# Patient Record
Sex: Female | Born: 1935 | Race: White | Hispanic: No | Marital: Married | Smoking: Former smoker
Health system: Southern US, Community
[De-identification: ages and names within clinical notes are randomized; demographics above are authoritative.]

## PROBLEM LIST (undated history)

## (undated) DIAGNOSIS — F0390 Unspecified dementia without behavioral disturbance: Secondary | ICD-10-CM

## (undated) DIAGNOSIS — F419 Anxiety disorder, unspecified: Secondary | ICD-10-CM

## (undated) DIAGNOSIS — K222 Esophageal obstruction: Secondary | ICD-10-CM

## (undated) DIAGNOSIS — J439 Emphysema, unspecified: Secondary | ICD-10-CM

## (undated) DIAGNOSIS — I10 Essential (primary) hypertension: Secondary | ICD-10-CM

## (undated) DIAGNOSIS — K589 Irritable bowel syndrome without diarrhea: Secondary | ICD-10-CM

## (undated) DIAGNOSIS — I739 Peripheral vascular disease, unspecified: Secondary | ICD-10-CM

## (undated) DIAGNOSIS — J189 Pneumonia, unspecified organism: Secondary | ICD-10-CM

## (undated) DIAGNOSIS — I5040 Unspecified combined systolic (congestive) and diastolic (congestive) heart failure: Secondary | ICD-10-CM

## (undated) HISTORY — DX: Unspecified dementia, unspecified severity, without behavioral disturbance, psychotic disturbance, mood disturbance, and anxiety: F03.90

## (undated) HISTORY — DX: Pneumonia, unspecified organism: J18.9

## (undated) HISTORY — DX: Irritable bowel syndrome, unspecified: K58.9

## (undated) HISTORY — PX: FEMORAL BYPASS: SHX50

## (undated) HISTORY — DX: Unspecified combined systolic (congestive) and diastolic (congestive) heart failure: I50.40

---

## 2011-09-18 ENCOUNTER — Emergency Department (HOSPITAL_COMMUNITY)
Admission: EM | Admit: 2011-09-18 | Discharge: 2011-09-18 | Disposition: A | Discharge status: Home / Self Care | Payer: Medicare Other | Attending: Emergency Medicine | Admitting: Emergency Medicine

## 2011-09-18 ENCOUNTER — Emergency Department (HOSPITAL_COMMUNITY): Payer: Medicare Other

## 2011-09-18 DIAGNOSIS — S2239XA Fracture of one rib, unspecified side, initial encounter for closed fracture: Secondary | ICD-10-CM | POA: Insufficient documentation

## 2011-09-18 DIAGNOSIS — S40029A Contusion of unspecified upper arm, initial encounter: Secondary | ICD-10-CM | POA: Insufficient documentation

## 2011-09-18 DIAGNOSIS — Y92009 Unspecified place in unspecified non-institutional (private) residence as the place of occurrence of the external cause: Secondary | ICD-10-CM | POA: Insufficient documentation

## 2011-09-18 DIAGNOSIS — Z794 Long term (current) use of insulin: Secondary | ICD-10-CM | POA: Insufficient documentation

## 2011-09-18 DIAGNOSIS — R079 Chest pain, unspecified: Secondary | ICD-10-CM | POA: Insufficient documentation

## 2011-09-18 DIAGNOSIS — W19XXXA Unspecified fall, initial encounter: Secondary | ICD-10-CM | POA: Insufficient documentation

## 2011-09-18 DIAGNOSIS — R51 Headache: Secondary | ICD-10-CM | POA: Insufficient documentation

## 2012-06-13 ENCOUNTER — Emergency Department (HOSPITAL_COMMUNITY): Payer: Medicare Other

## 2012-06-13 ENCOUNTER — Encounter (HOSPITAL_COMMUNITY): Payer: Self-pay | Admitting: *Deleted

## 2012-06-13 ENCOUNTER — Inpatient Hospital Stay (HOSPITAL_COMMUNITY)
Admission: EM | Admit: 2012-06-13 | Discharge: 2012-06-23 | DRG: 683 | Disposition: A | Discharge status: Home Health | Payer: Medicare Other | Attending: Internal Medicine | Admitting: Internal Medicine

## 2012-06-13 DIAGNOSIS — N390 Urinary tract infection, site not specified: Secondary | ICD-10-CM

## 2012-06-13 DIAGNOSIS — N289 Disorder of kidney and ureter, unspecified: Secondary | ICD-10-CM

## 2012-06-13 DIAGNOSIS — D696 Thrombocytopenia, unspecified: Secondary | ICD-10-CM

## 2012-06-13 DIAGNOSIS — J449 Chronic obstructive pulmonary disease, unspecified: Secondary | ICD-10-CM | POA: Diagnosis present

## 2012-06-13 DIAGNOSIS — E871 Hypo-osmolality and hyponatremia: Secondary | ICD-10-CM

## 2012-06-13 DIAGNOSIS — I10 Essential (primary) hypertension: Secondary | ICD-10-CM

## 2012-06-13 DIAGNOSIS — F411 Generalized anxiety disorder: Secondary | ICD-10-CM | POA: Diagnosis present

## 2012-06-13 DIAGNOSIS — M1990 Unspecified osteoarthritis, unspecified site: Secondary | ICD-10-CM

## 2012-06-13 DIAGNOSIS — Z681 Body mass index (BMI) 19 or less, adult: Secondary | ICD-10-CM

## 2012-06-13 DIAGNOSIS — B370 Candidal stomatitis: Secondary | ICD-10-CM | POA: Diagnosis present

## 2012-06-13 DIAGNOSIS — F419 Anxiety disorder, unspecified: Secondary | ICD-10-CM

## 2012-06-13 DIAGNOSIS — Z66 Do not resuscitate: Secondary | ICD-10-CM | POA: Diagnosis present

## 2012-06-13 DIAGNOSIS — E876 Hypokalemia: Secondary | ICD-10-CM

## 2012-06-13 DIAGNOSIS — E860 Dehydration: Secondary | ICD-10-CM

## 2012-06-13 DIAGNOSIS — D72829 Elevated white blood cell count, unspecified: Secondary | ICD-10-CM

## 2012-06-13 DIAGNOSIS — K222 Esophageal obstruction: Secondary | ICD-10-CM

## 2012-06-13 DIAGNOSIS — N179 Acute kidney failure, unspecified: Principal | ICD-10-CM

## 2012-06-13 DIAGNOSIS — J4489 Other specified chronic obstructive pulmonary disease: Secondary | ICD-10-CM | POA: Diagnosis present

## 2012-06-13 DIAGNOSIS — K449 Diaphragmatic hernia without obstruction or gangrene: Secondary | ICD-10-CM | POA: Diagnosis present

## 2012-06-13 DIAGNOSIS — R404 Transient alteration of awareness: Secondary | ICD-10-CM

## 2012-06-13 DIAGNOSIS — R64 Cachexia: Secondary | ICD-10-CM | POA: Diagnosis present

## 2012-06-13 DIAGNOSIS — I739 Peripheral vascular disease, unspecified: Secondary | ICD-10-CM

## 2012-06-13 DIAGNOSIS — E785 Hyperlipidemia, unspecified: Secondary | ICD-10-CM | POA: Diagnosis present

## 2012-06-13 HISTORY — DX: Esophageal obstruction: K22.2

## 2012-06-13 HISTORY — DX: Peripheral vascular disease, unspecified: I73.9

## 2012-06-13 HISTORY — DX: Essential (primary) hypertension: I10

## 2012-06-13 HISTORY — DX: Anxiety disorder, unspecified: F41.9

## 2012-06-13 LAB — URINE MICROSCOPIC-ADD ON

## 2012-06-13 LAB — CBC
HCT: 35.8 % — ABNORMAL LOW (ref 36.0–46.0)
Platelets: 124 10*3/uL — ABNORMAL LOW (ref 150–400)
RDW: 13.1 % (ref 11.5–15.5)
WBC: 17.6 10*3/uL — ABNORMAL HIGH (ref 4.0–10.5)

## 2012-06-13 LAB — URINALYSIS, ROUTINE W REFLEX MICROSCOPIC
Glucose, UA: NEGATIVE mg/dL
Ketones, ur: NEGATIVE mg/dL
Protein, ur: 100 mg/dL — AB
pH: 5.5 (ref 5.0–8.0)

## 2012-06-13 LAB — COMPREHENSIVE METABOLIC PANEL
ALT: 20 U/L (ref 0–35)
AST: 36 U/L (ref 0–37)
Albumin: 2.9 g/dL — ABNORMAL LOW (ref 3.5–5.2)
Alkaline Phosphatase: 120 U/L — ABNORMAL HIGH (ref 39–117)
Chloride: 89 mEq/L — ABNORMAL LOW (ref 96–112)
Potassium: 2.8 mEq/L — ABNORMAL LOW (ref 3.5–5.1)
Sodium: 128 mEq/L — ABNORMAL LOW (ref 135–145)
Total Bilirubin: 0.3 mg/dL (ref 0.3–1.2)

## 2012-06-13 LAB — DIFFERENTIAL
Basophils Absolute: 0 10*3/uL (ref 0.0–0.1)
Lymphocytes Relative: 4 % — ABNORMAL LOW (ref 12–46)
Monocytes Relative: 9 % (ref 3–12)
Neutro Abs: 15.3 10*3/uL — ABNORMAL HIGH (ref 1.7–7.7)
Neutrophils Relative %: 87 % — ABNORMAL HIGH (ref 43–77)

## 2012-06-13 MED ORDER — HYDROMORPHONE HCL PF 1 MG/ML IJ SOLN: 0.5000 mg | INTRAMUSCULAR | Status: DC | PRN

## 2012-06-13 MED ORDER — CIPROFLOXACIN IN D5W 400 MG/200ML IV SOLN
400.0000 mg | Freq: Once | INTRAVENOUS | Status: AC
  Administered 2012-06-13: 400 mg via INTRAVENOUS
  Filled 2012-06-13: qty 200

## 2012-06-13 MED ORDER — CIPROFLOXACIN IN D5W 200 MG/100ML IV SOLN
200.0000 mg | Freq: Two times a day (BID) | INTRAVENOUS | Status: DC
  Administered 2012-06-14 (×2): 200 mg via INTRAVENOUS
  Filled 2012-06-13 (×3): qty 100

## 2012-06-13 MED ORDER — OXYCODONE HCL 5 MG PO TABS
5.0000 mg | ORAL_TABLET | ORAL | Status: DC | PRN
  Administered 2012-06-14: 5 mg via ORAL
  Filled 2012-06-13: qty 1

## 2012-06-13 MED ORDER — ACETAMINOPHEN 325 MG PO TABS
650.0000 mg | ORAL_TABLET | Freq: Four times a day (QID) | ORAL | Status: DC | PRN
  Administered 2012-06-14 – 2012-06-19 (×2): 650 mg via ORAL
  Filled 2012-06-13 (×2): qty 2

## 2012-06-13 MED ORDER — MORPHINE SULFATE 2 MG/ML IJ SOLN
2.0000 mg | Freq: Once | INTRAMUSCULAR | Status: AC
  Administered 2012-06-13: 2 mg via INTRAVENOUS
  Filled 2012-06-13: qty 1

## 2012-06-13 MED ORDER — ALUM & MAG HYDROXIDE-SIMETH 200-200-20 MG/5ML PO SUSP: 30.0000 mL | Freq: Four times a day (QID) | ORAL | Status: DC | PRN

## 2012-06-13 MED ORDER — ONDANSETRON HCL 4 MG/2ML IJ SOLN
4.0000 mg | Freq: Once | INTRAMUSCULAR | Status: AC
  Administered 2012-06-13: 4 mg via INTRAVENOUS
  Filled 2012-06-13: qty 2

## 2012-06-13 MED ORDER — POTASSIUM CHLORIDE CRYS ER 20 MEQ PO TBCR
40.0000 meq | EXTENDED_RELEASE_TABLET | Freq: Once | ORAL | Status: AC
  Administered 2012-06-13: 40 meq via ORAL
  Filled 2012-06-13: qty 2

## 2012-06-13 MED ORDER — SODIUM CHLORIDE 0.9 % IV SOLN
INTRAVENOUS | Status: DC
  Administered 2012-06-14 – 2012-06-15 (×3): via INTRAVENOUS

## 2012-06-13 MED ORDER — SODIUM CHLORIDE 0.9 % IV BOLUS (SEPSIS)
500.0000 mL | Freq: Once | INTRAVENOUS | Status: AC
  Administered 2012-06-13: 500 mL via INTRAVENOUS

## 2012-06-13 MED ORDER — ZOLPIDEM TARTRATE 5 MG PO TABS: 5.0000 mg | ORAL_TABLET | Freq: Every evening | ORAL | Status: DC | PRN

## 2012-06-13 MED ORDER — ONDANSETRON HCL 4 MG PO TABS: 4.0000 mg | ORAL_TABLET | Freq: Four times a day (QID) | ORAL | Status: DC | PRN

## 2012-06-13 MED ORDER — ONDANSETRON HCL 4 MG/2ML IJ SOLN: 4.0000 mg | Freq: Four times a day (QID) | INTRAMUSCULAR | Status: DC | PRN

## 2012-06-13 MED ORDER — ACETAMINOPHEN 650 MG RE SUPP: 650.0000 mg | Freq: Four times a day (QID) | RECTAL | Status: DC | PRN

## 2012-06-13 NOTE — ED Provider Notes (Addendum)
History     CSN: [id number]  Arrival date & time 06/13/12  1713   First MD Initiated Contact with Patient 06/13/12 1804      Chief Complaint  Patient presents with  . Flank Pain    (Consider location/radiation/quality/duration/timing/severity/associated sxs/prior treatment) HPI Comments: PT from [facility] place, sent over for complaints of left flank pain.  Was initially having some UTI symptoms, urine showed UTI, was started on bactrim last night.  Has had worsening pain to left mid abdomen, radiating to back.  Also has cough productive of yellow sputum.  Has has some nausea, no vomiting.  Some low grade fevers.  Patient is a 78 y.o. female presenting with flank pain. The history is provided by the patient.  Flank Pain This is a new problem. The current episode started more than 2 days ago. The problem occurs constantly. The problem has been gradually worsening. Associated symptoms include abdominal pain. Pertinent negatives include no chest pain, no headaches and no shortness of breath. Nothing aggravates the symptoms. Nothing relieves the symptoms.    Past Medical History  Diagnosis Date  . Hypertension   . Peripheral arterial disease   . Cognitive disorder   . Esophageal stricture   . Osteoarthritis   . Anxiety     Past Surgical History  Procedure Date  . Femoral bypass     History reviewed. No pertinent family history.  History  Substance Use Topics  . Smoking status: Former Games developer  . Smokeless tobacco: Not on file  . Alcohol Use: No    OB History    Grav Para Term Preterm Abortions TAB SAB Ect Mult Living                  Review of Systems  Constitutional: Negative for fever, chills, diaphoresis and fatigue.  HENT: Negative for congestion, rhinorrhea and sneezing.   Eyes: Negative.   Respiratory: Positive for cough. Negative for chest tightness and shortness of breath.   Cardiovascular: Negative for chest pain and leg swelling.  Gastrointestinal:  Positive for abdominal pain. Negative for nausea, vomiting, diarrhea and blood in stool.  Genitourinary: Positive for flank pain. Negative for frequency, hematuria and difficulty urinating.  Musculoskeletal: Negative for back pain and arthralgias.  Skin: Negative for rash.  Neurological: Negative for dizziness, speech difficulty, weakness, numbness and headaches.    Allergies  Penicillins and Sulfa antibiotics  Home Medications   Current Outpatient Rx  Name Route Sig Dispense Refill  . ACETAMINOPHEN 325 MG PO TABS Oral Take 650 mg by mouth every 4 (four) hours as needed. Pain.    . ALBUTEROL SULFATE HFA 108 (90 BASE) MCG/ACT IN AERS Inhalation Inhale 2 puffs into the lungs every 4 (four) hours as needed. Wheezing/shortness of breath.    . ASPIRIN 81 MG PO CHEW Oral Chew 81 mg by mouth daily.    Marland Kitchen VITAMIN D 1000 UNITS PO CAPS Oral Take 1,000 Units by mouth daily.    Marland Kitchen CLOPIDOGREL BISULFATE 75 MG PO TABS Oral Take 75 mg by mouth daily.    Marland Kitchen DIPYRIDAMOLE 50 MG PO TABS Oral Take 100 mg by mouth 2 (two) times daily.    . DONEPEZIL HCL 5 MG PO TABS Oral Take 5 mg by mouth at bedtime.    Marland Kitchen FLUTICASONE-SALMETEROL 100-50 MCG/DOSE IN AEPB Inhalation Inhale 1 puff into the lungs every 12 (twelve) hours.    Marland Kitchen HYDROCHLOROTHIAZIDE 25 MG PO TABS Oral Take 25 mg by mouth daily.    Marland Kitchen MELATONIN  1 MG PO TABS Oral Take 1 mg by mouth at bedtime.    Marland Kitchen METOPROLOL TARTRATE 25 MG PO TABS Oral Take 25 mg by mouth 2 (two) times daily.    Marland Kitchen MIRTAZAPINE 15 MG PO TABS Oral Take 7.5 mg by mouth at bedtime.    Marland Kitchen PRAVASTATIN SODIUM 20 MG PO TABS Oral Take 20 mg by mouth daily.    Marland Kitchen PROMETHAZINE HCL 12.5 MG PO TABS Oral Take 12.5 mg by mouth every 6 (six) hours as needed. Nausea.    Marland Kitchen RANITIDINE HCL 300 MG PO CAPS Oral Take 300 mg by mouth 2 (two) times daily.    Marland Kitchen ROPINIROLE HCL 0.5 MG PO TABS Oral Take 0.5 mg by mouth every evening.    Marland Kitchen SALINE 0.65 % NA SOLN Nasal Place 2 sprays into the nose every morning. May keep  at bedside.    . SULFAMETHOXAZOLE-TMP DS 800-160 MG PO TABS Oral Take 1 tablet by mouth 2 (two) times daily. For 7 days.      BP 141/54  Pulse 89  Temp 98.1 F (36.7 C) (Oral)  Resp 20  Wt 90 lb (40.824 kg)  SpO2 94%  Physical Exam  Constitutional: She is oriented to person, place, and time. She appears well-developed and well-nourished.  HENT:  Head: Normocephalic and atraumatic.  Eyes: Pupils are equal, round, and reactive to light.  Neck: Normal range of motion. Neck supple.  Cardiovascular: Normal rate, regular rhythm and normal heart sounds.   Pulmonary/Chest: Effort normal and breath sounds normal. No respiratory distress. She has no wheezes. She has no rales. She exhibits no tenderness.  Abdominal: Soft. Bowel sounds are normal. There is tenderness (mild diffuse tenderness). There is no rebound and no guarding.  Musculoskeletal: Normal range of motion. She exhibits no edema.  Lymphadenopathy:    She has no cervical adenopathy.  Neurological: She is alert and oriented to person, place, and time.  Skin: Skin is warm and dry. No rash noted.  Psychiatric: She has a normal mood and affect.    ED Course  Procedures (including critical care time)  Results for orders placed during the hospital encounter of 06/13/12  CBC      Component Value Range   WBC 17.6 (*) 4.0 - 10.5 K/uL   RBC 4.17  3.87 - 5.11 MIL/uL   Hemoglobin 12.4  12.0 - 15.0 g/dL   HCT 96.0 (*) 45.4 - 09.8 %   MCV 85.9  78.0 - 100.0 fL   MCH 29.7  26.0 - 34.0 pg   MCHC 34.6  30.0 - 36.0 g/dL   RDW 11.9  14.7 - 82.9 %   Platelets 124 (*) 150 - 400 K/uL  DIFFERENTIAL      Component Value Range   Neutrophils Relative 87 (*) 43 - 77 %   Lymphocytes Relative 4 (*) 12 - 46 %   Monocytes Relative 9  3 - 12 %   Eosinophils Relative 0  0 - 5 %   Basophils Relative 0  0 - 1 %   Neutro Abs 15.3 (*) 1.7 - 7.7 K/uL   Lymphs Abs 0.7  0.7 - 4.0 K/uL   Monocytes Absolute 1.6 (*) 0.1 - 1.0 K/uL   Eosinophils Absolute  0.0  0.0 - 0.7 K/uL   Basophils Absolute 0.0  0.0 - 0.1 K/uL   WBC Morphology TOXIC GRANULATION    COMPREHENSIVE METABOLIC PANEL      Component Value Range   Sodium 128 (*) 135 -  145 mEq/L   Potassium 2.8 (*) 3.5 - 5.1 mEq/L   Chloride 89 (*) 96 - 112 mEq/L   CO2 22  19 - 32 mEq/L   Glucose, Bld 93  70 - 99 mg/dL   BUN 81 (*) 6 - 23 mg/dL   Creatinine, Ser 1.61 (*) 0.50 - 1.10 mg/dL   Calcium 8.8  8.4 - 09.6 mg/dL   Total Protein 6.9  6.0 - 8.3 g/dL   Albumin 2.9 (*) 3.5 - 5.2 g/dL   AST 36  0 - 37 U/L   ALT 20  0 - 35 U/L   Alkaline Phosphatase 120 (*) 39 - 117 U/L   Total Bilirubin 0.3  0.3 - 1.2 mg/dL   GFR calc non Af Amer 9 (*) >90 mL/min   GFR calc Af Amer 11 (*) >90 mL/min  URINALYSIS, ROUTINE W REFLEX MICROSCOPIC      Component Value Range   Color, Urine YELLOW  YELLOW   APPearance CLOUDY (*) CLEAR   Specific Gravity, Urine 1.018  1.005 - 1.030   pH 5.5  5.0 - 8.0   Glucose, UA NEGATIVE  NEGATIVE mg/dL   Hgb urine dipstick MODERATE (*) NEGATIVE   Bilirubin Urine NEGATIVE  NEGATIVE   Ketones, ur NEGATIVE  NEGATIVE mg/dL   Protein, ur 045 (*) NEGATIVE mg/dL   Urobilinogen, UA 0.2  0.0 - 1.0 mg/dL   Nitrite NEGATIVE  NEGATIVE   Leukocytes, UA MODERATE (*) NEGATIVE  LIPASE, BLOOD      Component Value Range   Lipase 52  11 - 59 U/L  URINE MICROSCOPIC-ADD ON      Component Value Range   Squamous Epithelial / LPF RARE  RARE   WBC, UA 21-50  <3 WBC/hpf   RBC / HPF 3-6  <3 RBC/hpf   Bacteria, UA MANY (*) RARE   Urine-Other AMORPHOUS URATES/PHOSPHATES     Ct Abdomen Pelvis Wo Contrast  06/13/2012  *RADIOLOGY REPORT*  Clinical Data: 2-day history of left flank pain.  Hematuria. Recent diagnosis of urinary tract infection.  CT ABDOMEN AND PELVIS WITHOUT CONTRAST  Technique:  Multidetector CT imaging of the abdomen and pelvis was performed following the standard protocol without intravenous contrast.  Comparison: None.  Findings: No evidence of urinary tract calculi or  obstruction on either side.  Cortical calcification cortical calcification involving the anterior aspect of the lower pole left kidney. Approximate 3.3 x 3.0 cm simple cyst arising from the upper pole of the left kidney.  Approximate 4.2 x 2.6 cm cyst arising from the lateral aspect of the lower pole and approximate 1.3 cm diameter cyst arising from the medial aspect of the lower pole left kidney. Approximate 1.6 cm diameter simple cyst arising from the lower pole right kidney.  Within the limits of the unenhanced technique, no solid masses involving either kidney.  Right kidney mildly atrophic, with compensatory enlargement of the left kidney.  Normal unenhanced appearance of the liver, spleen, pancreas, and right adrenal gland.  Approximate 1.6 x 1.2 cm low attenuation nodule involving the left adrenal gland.  Gallbladder unremarkable by CT.  No biliary ductal dilation.  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  No significant lymphadenopathy.  Small hiatal hernia.  Stomach decompressed otherwise unremarkable. Normal-appearing small bowel.  Extensive sigmoid colon diverticulosis without evidence of acute diverticulitis.  Remainder the colon unremarkable.  No ascites.  Urinary bladder decompressed and unremarkable.  Uterus atrophic consistent with age.  No adnexal masses or free pelvic fluid.  Bone window  images demonstrate degenerative changes involving the lower thoracic and lumbar spine and both hips.  Visualized lung bases emphysematous but clear.  Heart size upper normal.  IMPRESSION:  1.  No evidence of urinary tract calculi or obstruction on either side. 2.  Multiple bilateral renal cysts.  Mildly atrophic right kidney. 3.  Small left adrenal nodule, statistically consistent with an adenoma. 4.  Sigmoid colon diverticulosis without evidence of acute diverticulitis. 5.  Small hiatal hernia.  Original Report Authenticated By: Arnell Sieving, M.D.   Dg Chest 2 View  06/13/2012   *RADIOLOGY REPORT*  Clinical Data: Cough and fever.  CHEST - 2 VIEW  Comparison: 09/18/2011  Findings: Cardiomegaly and hyperinflation again noted. Mild peribronchial thickening is unchanged. There is no evidence of focal airspace disease, pulmonary edema, suspicious pulmonary nodule/mass, pleural effusion, or pneumothorax. No acute bony abnormalities are identified.  IMPRESSION: No evidence of acute cardiopulmonary disease.  Cardiomegaly with mild chronic peribronchial thickening and hyperinflation.  Original Report Authenticated By: Rosendo Gros, M.D.      1. UTI (lower urinary tract infection)   2. Renal insufficiency   3. Hypokalemia   4. Hyponatremia       MDM  Pt with elevated WBC, renal insufficiency.  Discussed with nurse at [facility] and last labs were 01/10/12, showing normal sodium and potassium and creatinine of 1.12.  Will plan on admission for pt for further evaluation and treatment.        Rolan Bucco, MD 06/13/12 [id number]  Rolan Bucco, MD 07/12/12 0030

## 2012-06-13 NOTE — ED Notes (Signed)
Pt c/o left flank pain since Wednesday, pt recently diagnosed with UTI and started on antibiotics yesterday. States pain "is worse than having a baby." Family also reports pt has been "Coughing up yellow stuff." since Sunday. Pt is resident at [facility].

## 2012-06-14 ENCOUNTER — Encounter (HOSPITAL_COMMUNITY): Payer: Self-pay | Admitting: *Deleted

## 2012-06-14 DIAGNOSIS — E860 Dehydration: Secondary | ICD-10-CM

## 2012-06-14 DIAGNOSIS — E782 Mixed hyperlipidemia: Secondary | ICD-10-CM

## 2012-06-14 DIAGNOSIS — R404 Transient alteration of awareness: Secondary | ICD-10-CM

## 2012-06-14 DIAGNOSIS — D72829 Elevated white blood cell count, unspecified: Secondary | ICD-10-CM | POA: Diagnosis present

## 2012-06-14 DIAGNOSIS — N179 Acute kidney failure, unspecified: Secondary | ICD-10-CM

## 2012-06-14 LAB — BASIC METABOLIC PANEL
BUN: 77 mg/dL — ABNORMAL HIGH (ref 6–23)
Creatinine, Ser: 3.68 mg/dL — ABNORMAL HIGH (ref 0.50–1.10)
GFR calc Af Amer: 12 mL/min — ABNORMAL LOW (ref >90)
GFR calc non Af Amer: 10 mL/min — ABNORMAL LOW (ref >90)
Potassium: 3.5 mEq/L (ref 3.5–5.1)

## 2012-06-14 LAB — CBC
Hemoglobin: 11.6 g/dL — ABNORMAL LOW (ref 12.0–15.0)
MCHC: 33.6 g/dL (ref 30.0–36.0)
RDW: 13.2 % (ref 11.5–15.5)

## 2012-06-14 LAB — NA AND K (SODIUM & POTASSIUM), RAND UR
Potassium Urine: 14 mEq/L
Sodium, Ur: 39 mEq/L

## 2012-06-14 MED ORDER — CLOPIDOGREL BISULFATE 75 MG PO TABS
75.0000 mg | ORAL_TABLET | Freq: Every day | ORAL | Status: DC
  Administered 2012-06-14 – 2012-06-17 (×4): 75 mg via ORAL
  Filled 2012-06-14 (×4): qty 1

## 2012-06-14 MED ORDER — ROPINIROLE HCL 0.5 MG PO TABS
0.5000 mg | ORAL_TABLET | Freq: Every day | ORAL | Status: DC
  Administered 2012-06-14 – 2012-06-22 (×9): 0.5 mg via ORAL
  Filled 2012-06-14 (×10): qty 1

## 2012-06-14 MED ORDER — ASPIRIN 81 MG PO CHEW
81.0000 mg | CHEWABLE_TABLET | Freq: Every day | ORAL | Status: DC
  Administered 2012-06-14 – 2012-06-23 (×10): 81 mg via ORAL
  Filled 2012-06-14 (×11): qty 1

## 2012-06-14 MED ORDER — SODIUM CHLORIDE 0.9 % IV SOLN
INTRAVENOUS | Status: AC
  Administered 2012-06-14: 02:00:00 via INTRAVENOUS

## 2012-06-14 MED ORDER — SALINE SPRAY 0.65 % NA SOLN
2.0000 | Freq: Every morning | NASAL | Status: DC
  Administered 2012-06-14 – 2012-06-23 (×10): 2 via NASAL
  Filled 2012-06-14: qty 44

## 2012-06-14 MED ORDER — DONEPEZIL HCL 5 MG PO TABS
5.0000 mg | ORAL_TABLET | Freq: Every day | ORAL | Status: DC
  Administered 2012-06-14 – 2012-06-22 (×9): 5 mg via ORAL
  Filled 2012-06-14 (×10): qty 1

## 2012-06-14 MED ORDER — NYSTATIN 100000 UNIT/ML MT SUSP
5.0000 mL | Freq: Four times a day (QID) | OROMUCOSAL | Status: DC
  Administered 2012-06-14 – 2012-06-20 (×25): 500000 [IU] via ORAL
  Filled 2012-06-14 (×29): qty 5

## 2012-06-14 MED ORDER — DIPYRIDAMOLE 50 MG PO TABS
100.0000 mg | ORAL_TABLET | Freq: Two times a day (BID) | ORAL | Status: DC
  Administered 2012-06-14 – 2012-06-19 (×12): 100 mg via ORAL
  Filled 2012-06-14 (×14): qty 2

## 2012-06-14 MED ORDER — MIRTAZAPINE 7.5 MG PO TABS
7.5000 mg | ORAL_TABLET | Freq: Every day | ORAL | Status: DC
  Administered 2012-06-14 – 2012-06-22 (×9): 7.5 mg via ORAL
  Filled 2012-06-14 (×10): qty 1

## 2012-06-14 MED ORDER — BIOTENE DRY MOUTH MT LIQD
15.0000 mL | Freq: Two times a day (BID) | OROMUCOSAL | Status: DC
  Administered 2012-06-14 – 2012-06-23 (×18): 15 mL via OROMUCOSAL

## 2012-06-14 MED ORDER — METOPROLOL TARTRATE 25 MG PO TABS
25.0000 mg | ORAL_TABLET | Freq: Two times a day (BID) | ORAL | Status: DC
  Administered 2012-06-14 – 2012-06-17 (×7): 25 mg via ORAL
  Filled 2012-06-14 (×8): qty 1

## 2012-06-14 MED ORDER — SIMVASTATIN 10 MG PO TABS
10.0000 mg | ORAL_TABLET | Freq: Every day | ORAL | Status: DC
  Administered 2012-06-14 – 2012-06-22 (×9): 10 mg via ORAL
  Filled 2012-06-14 (×10): qty 1

## 2012-06-14 MED ORDER — FLUTICASONE-SALMETEROL 100-50 MCG/DOSE IN AEPB
1.0000 | INHALATION_SPRAY | Freq: Two times a day (BID) | RESPIRATORY_TRACT | Status: DC
  Administered 2012-06-14 – 2012-06-23 (×18): 1 via RESPIRATORY_TRACT
  Filled 2012-06-14 (×2): qty 14

## 2012-06-14 MED ORDER — ALBUTEROL SULFATE HFA 108 (90 BASE) MCG/ACT IN AERS
2.0000 | INHALATION_SPRAY | RESPIRATORY_TRACT | Status: DC | PRN
  Administered 2012-06-14 – 2012-06-19 (×4): 2 via RESPIRATORY_TRACT
  Filled 2012-06-14: qty 6.7

## 2012-06-14 MED ORDER — PANTOPRAZOLE SODIUM 40 MG PO TBEC
40.0000 mg | DELAYED_RELEASE_TABLET | Freq: Every day | ORAL | Status: DC
  Administered 2012-06-14 – 2012-06-23 (×10): 40 mg via ORAL
  Filled 2012-06-14 (×11): qty 1

## 2012-06-14 NOTE — Progress Notes (Signed)
Subjective: No new issues overnight.  Objective: Vital signs in last 24 hours: Temp:  [97.6 F (36.4 C)-98.7 F (37.1 C)] 98.4 F (36.9 C) (06/22 0628) Pulse Rate:  [83-90] 90  (06/22 0628) Resp:  [16-22] 20  (06/22 0628) BP: (103-145)/(42-73) 145/73 mmHg (06/22 0628) SpO2:  [94 %-97 %] 97 % (06/22 0628) Weight:  [40.824 kg (90 lb)-41.3 kg (91 lb 0.8 oz)] 41.3 kg (91 lb 0.8 oz) (06/22 0100) Weight change:  Last BM Date: 06/13/12  Intake/Output from previous day: 06/21 0701 - 06/22 0700 In: 337.5 [I.V.:337.5] Out: 400 [Urine:400] Total I/O In: -  Out: 150 [Urine:150]   Physical Exam: General: Comfortable, alert, very garrulous and communicative, not short of breath at rest, has occasional cough. HEENT:  Mild clinical pallor, no jaundice, no conjunctival injection or discharge. Hydration appears to have improved.  NECK:  Supple, JVP not seen, no carotid bruits, no palpable lymphadenopathy, no palpable goiter. CHEST:  Clinically clear to auscultation, no wheezes, no crackles. HEART:  Sounds 1 and 2 heard, normal, regular, no murmurs. ABDOMEN:  Flat, soft, non-tender, no palpable organomegaly, no palpable masses, normal bowel sounds. GENITALIA:  Not examined. LOWER EXTREMITIES:  No pitting edema, palpable peripheral pulses. MUSCULOSKELETAL SYSTEM:  Generalized osteoarthritic changes, otherwise, normal. CENTRAL NERVOUS SYSTEM:  No focal neurologic deficit on gross examination.  Lab Results:  [facility] 06/14/12 0537 06/13/12 1837  WBC 12.6* 17.6*  HGB 11.6* 12.4  HCT 34.5* 35.8*  PLT 107* 124*    Basename 06/14/12 0537 06/13/12 1837  NA 130* 128*  K 3.5 2.8*  CL 96 89*  CO2 19 22  GLUCOSE 76 93  BUN 77* 81*  CREATININE 3.68* 3.93*  CALCIUM 8.0* 8.8   Recent Results (from the past 240 hour(s))  MRSA PCR SCREENING     Status: Normal   Collection Time   06/14/12  2:14 AM      Component Value Range Status Comment   MRSA by PCR NEGATIVE  NEGATIVE Final       Studies/Results: Ct Abdomen Pelvis Wo Contrast  06/13/2012  *RADIOLOGY REPORT*  Clinical Data: 2-day history of left flank pain.  Hematuria. Recent diagnosis of urinary tract infection.  CT ABDOMEN AND PELVIS WITHOUT CONTRAST  Technique:  Multidetector CT imaging of the abdomen and pelvis was performed following the standard protocol without intravenous contrast.  Comparison: None.  Findings: No evidence of urinary tract calculi or obstruction on either side.  Cortical calcification cortical calcification involving the anterior aspect of the lower pole left kidney. Approximate 3.3 x 3.0 cm simple cyst arising from the upper pole of the left kidney.  Approximate 4.2 x 2.6 cm cyst arising from the lateral aspect of the lower pole and approximate 1.3 cm diameter cyst arising from the medial aspect of the lower pole left kidney. Approximate 1.6 cm diameter simple cyst arising from the lower pole right kidney.  Within the limits of the unenhanced technique, no solid masses involving either kidney.  Right kidney mildly atrophic, with compensatory enlargement of the left kidney.  Normal unenhanced appearance of the liver, spleen, pancreas, and right adrenal gland.  Approximate 1.6 x 1.2 cm low attenuation nodule involving the left adrenal gland.  Gallbladder unremarkable by CT.  No biliary ductal dilation.  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  No significant lymphadenopathy.  Small hiatal hernia.  Stomach decompressed otherwise unremarkable. Normal-appearing small bowel.  Extensive sigmoid colon diverticulosis without evidence of acute diverticulitis.  Remainder the colon unremarkable.  No ascites.  Urinary  bladder decompressed and unremarkable.  Uterus atrophic consistent with age.  No adnexal masses or free pelvic fluid.  Bone window images demonstrate degenerative changes involving the lower thoracic and lumbar spine and both hips.  Visualized lung bases emphysematous but clear.  Heart  size upper normal.  IMPRESSION:  1.  No evidence of urinary tract calculi or obstruction on either side. 2.  Multiple bilateral renal cysts.  Mildly atrophic right kidney. 3.  Small left adrenal nodule, statistically consistent with an adenoma. 4.  Sigmoid colon diverticulosis without evidence of acute diverticulitis. 5.  Small hiatal hernia.  Original Report Authenticated By: Arnell Sieving, M.D.   Dg Chest 2 View  06/13/2012  *RADIOLOGY REPORT*  Clinical Data: Cough and fever.  CHEST - 2 VIEW  Comparison: 09/18/2011  Findings: Cardiomegaly and hyperinflation again noted. Mild peribronchial thickening is unchanged. There is no evidence of focal airspace disease, pulmonary edema, suspicious pulmonary nodule/mass, pleural effusion, or pneumothorax. No acute bony abnormalities are identified.  IMPRESSION: No evidence of acute cardiopulmonary disease.  Cardiomegaly with mild chronic peribronchial thickening and hyperinflation.  Original Report Authenticated By: Rosendo Gros, M.D.    Medications: Scheduled Meds:   . sodium chloride   Intravenous STAT  . antiseptic oral rinse  15 mL Mouth Rinse BID  . ciprofloxacin  200 mg Intravenous Q12H  . ciprofloxacin  400 mg Intravenous Once  .  morphine injection  2 mg Intravenous Once  .  morphine injection  2 mg Intravenous Once  . ondansetron  4 mg Intravenous Once  . potassium chloride  40 mEq Oral Once  . sodium chloride  500 mL Intravenous Once   Continuous Infusions:   . sodium chloride 75 mL/hr at 06/13/12 2347   PRN Meds:.acetaminophen, acetaminophen, alum & mag hydroxide-simeth, HYDROmorphone (DILAUDID) injection, ondansetron (ZOFRAN) IV, ondansetron, oxyCODONE, zolpidem  Assessment/Plan:  Active Problems: 1. UTI (urinary tract infection): Patient was found to have a UTI in the facility and was placed on Bactrim 2 days ago, and now presents with fever and chills. Urinalysis showed positive sediment, with pyuria and significant  bacteriuria. She has been commenced on iv Ciprofloxacin, now day#1, and cultures are pending. This AM , she is apyrexial, and wcc is already trending down.  2. Dehydration/ARF (acute renal failure): Patient presented with BUN 81 and creatinine 3.93, consistent with dehydration and ARF, against a background of UTI, poor oral intake and diuretics. Unfortunately, baseline creatinine is unknown at this time. We are managing with iv fluids, diuretics have been discontinued, and overnight, creatinine has already shown some improvement. Following renal indices.  3. Hyponatremia: Patient had a hyponatremia of 128 at presentation, secondary to diuretics and volume depletion. Managing as described above. 4. Hypokalemia: Repleting as indicated. 5. Hypertension: BP is reasonably controlled, at this time.  6. Peripheral arterial disease: Not problematic.  7. Anxiety/Dementia: Stable on pre-admission psychotropics.  8. Dyslipidemia: Continue Statin. 9. Esophageal stricture: Patient gets periodic esophageal dilatations, by primary GI in [place]. According to her, this is due soon.        LOS: 1 day   Honestee Revard,CHRISTOPHER 06/14/2012, 11:24 AM

## 2012-06-14 NOTE — Progress Notes (Signed)
Patient states she hasn't slept in two days and requesting a nerve pill.

## 2012-06-14 NOTE — H&P (Signed)
DATE OF ADMISSION:  06/14/2012  PCP:    No primary provider on file.   Chief Complaint: Fever and Lower ABD Pain   HPI: [name] is an 78 y.o. female resident of [facility] who was sent to the ED due to complaints of fevers and chills today,  and lower ABD Pain radiating into her back worsening over the past 5 days.  She was found to have a UTI in the facility and was placed on Bactrim 2 days ago but her symptoms worsened.  She also has had a cough but her daughter reports that this usually happens when she need her esophagus dilated due to a stricture that she has.  In the ED she was found to have an elevated BUN/Cr, and a +UA, and a CT scan without contrast was performed which did not reveal pyelonephritis nor a kidney stone.  She was placed on IV Cipro due to her allergies and recent Bactrim therapy.    Past Medical History  Diagnosis Date  . Hypertension   . Peripheral arterial disease   . Cognitive disorder   . Esophageal stricture   . Osteoarthritis   . Anxiety        Diverticulosis  Past Surgical History  Procedure Date  . Femoral bypass     Medications:  HOME MEDS: Prior to Admission medications   Medication Sig Start Date End Date Taking? Authorizing Provider  acetaminophen (MAPAP) 325 MG tablet Take 650 mg by mouth every 4 (four) hours as needed. Pain.   Yes Historical Provider, MD  albuterol (PROVENTIL HFA;VENTOLIN HFA) 108 (90 BASE) MCG/ACT inhaler Inhale 2 puffs into the lungs every 4 (four) hours as needed. Wheezing/shortness of breath.   Yes Historical Provider, MD  aspirin 81 MG chewable tablet Chew 81 mg by mouth daily.   Yes Historical Provider, MD  Cholecalciferol (VITAMIN D) 1000 UNITS capsule Take 1,000 Units by mouth daily.   Yes Historical Provider, MD  clopidogrel (PLAVIX) 75 MG tablet Take 75 mg by mouth daily.   Yes Historical Provider, MD  dipyridamole (PERSANTINE) 50 MG tablet Take 100 mg by mouth 2 (two) times daily.   Yes Historical  Provider, MD  donepezil (ARICEPT) 5 MG tablet Take 5 mg by mouth at bedtime.   Yes Historical Provider, MD  Fluticasone-Salmeterol (ADVAIR) 100-50 MCG/DOSE AEPB Inhale 1 puff into the lungs every 12 (twelve) hours.   Yes Historical Provider, MD  hydrochlorothiazide (HYDRODIURIL) 25 MG tablet Take 25 mg by mouth daily.   Yes Historical Provider, MD  Melatonin 1 MG TABS Take 1 mg by mouth at bedtime.   Yes Historical Provider, MD  metoprolol tartrate (LOPRESSOR) 25 MG tablet Take 25 mg by mouth 2 (two) times daily.   Yes Historical Provider, MD  mirtazapine (REMERON) 15 MG tablet Take 7.5 mg by mouth at bedtime.   Yes Historical Provider, MD  pravastatin (PRAVACHOL) 20 MG tablet Take 20 mg by mouth daily.   Yes Historical Provider, MD  promethazine (PHENERGAN) 12.5 MG tablet Take 12.5 mg by mouth every 6 (six) hours as needed. Nausea.   Yes Historical Provider, MD  ranitidine (ZANTAC) 300 MG capsule Take 300 mg by mouth 2 (two) times daily.   Yes Historical Provider, MD  rOPINIRole (REQUIP) 0.5 MG tablet Take 0.5 mg by mouth every evening.   Yes Historical Provider, MD  sodium chloride (OCEAN) 0.65 % SOLN nasal spray Place 2 sprays into the nose every morning. May keep at bedside.   Yes Historical  Provider, MD  sulfamethoxazole-trimethoprim (BACTRIM DS) 800-160 MG per tablet Take 1 tablet by mouth 2 (two) times daily. For 7 days. 06/12/12  Yes Historical Provider, MD    Allergies:  Allergies  Allergen Reactions  . Penicillins   . Sulfa Antibiotics     Social History:   reports that she has quit smoking. She does not have any smokeless tobacco history on file. She reports that she does not drink alcohol or use illicit drugs.  Family History: Family History  Problem Relation Age of Onset  . Coronary artery disease Father   . Coronary artery disease Mother   . Hypertension Mother   . Breast cancer Maternal Grandmother   . Breast cancer Maternal Aunt     Review of Systems: abdominal  pain The patient denies anorexia, fever, weight loss, vision loss, decreased hearing, hoarseness, chest pain, syncope, dyspnea on exertion, peripheral edema, balance deficits, hemoptysis, melena, hematochezia, severe indigestion/heartburn, hematuria, incontinence, genital sores, muscle weakness, suspicious skin lesions, transient blindness, difficulty walking, depression, unusual weight change, abnormal bleeding, enlarged lymph nodes, angioedema, and breast masses.   Physical Exam:  GEN:  Pleasant Elderly Thin 78 year old Caucasian female examined  and in no acute distress; cooperative with exam Filed Vitals:   06/13/12 1725 06/13/12 1739 06/13/12 2131  BP: 103/42  141/54  Pulse: 83  89  Temp: 97.6 F (36.4 C)  98.1 F (36.7 C)  TempSrc: Oral  Oral  Resp: 16  20  Weight:  40.824 kg (90 lb)   SpO2: 94%  94%   Blood pressure 141/54, pulse 89, temperature 98.1 F (36.7 C), temperature source Oral, resp. rate 20, weight 40.824 kg (90 lb), SpO2 94.00%. PSYCH: She is alert and oriented x4; does not appear anxious does not appear depressed; affect is normal HEENT: Normocephalic and Atraumatic, Mucous membranes pink; PERRLA; EOM intact; Fundi:  Benign;  No scleral icterus, Nares: Patent, Oropharynx: Clear, Poor Dentition, Neck:  FROM, no cervical lymphadenopathy nor thyromegaly or carotid bruit; no JVD; Breasts:: Not examined CHEST WALL: No tenderness CHEST: Normal respiration, clear to auscultation bilaterally HEART: Regular rate and rhythm; no murmurs rubs or gallops BACK: No kyphosis or scoliosis; no CVA tenderness ABDOMEN: Positive Bowel Sounds, soft non-tender; no masses, no organomegaly.   Rectal Exam: Not done EXTREMITIES: No bone or joint deformity; age-appropriate arthropathy of the hands and knees; no cyanosis, clubbing or edema; no ulcerations. Genitalia: not examined PULSES: 2+ and symmetric SKIN: Normal hydration no rash or ulceration CNS: Cranial nerves 2-12 grossly intact no  focal neurologic deficit   Labs & Imaging Results for orders placed during the hospital encounter of 06/13/12 (from the past 48 hour(s))  CBC     Status: Abnormal   Collection Time   06/13/12  6:37 PM      Component Value Range Comment   WBC 17.6 (*) 4.0 - 10.5 K/uL    RBC 4.17  3.87 - 5.11 MIL/uL    Hemoglobin 12.4  12.0 - 15.0 g/dL    HCT 16.1 (*) 09.6 - 46.0 %    MCV 85.9  78.0 - 100.0 fL    MCH 29.7  26.0 - 34.0 pg    MCHC 34.6  30.0 - 36.0 g/dL    RDW 04.5  40.9 - 81.1 %    Platelets 124 (*) 150 - 400 K/uL   DIFFERENTIAL     Status: Abnormal   Collection Time   06/13/12  6:37 PM      Component Value  Range Comment   Neutrophils Relative 87 (*) 43 - 77 %    Lymphocytes Relative 4 (*) 12 - 46 %    Monocytes Relative 9  3 - 12 %    Eosinophils Relative 0  0 - 5 %    Basophils Relative 0  0 - 1 %    Neutro Abs 15.3 (*) 1.7 - 7.7 K/uL    Lymphs Abs 0.7  0.7 - 4.0 K/uL    Monocytes Absolute 1.6 (*) 0.1 - 1.0 K/uL    Eosinophils Absolute 0.0  0.0 - 0.7 K/uL    Basophils Absolute 0.0  0.0 - 0.1 K/uL    WBC Morphology TOXIC GRANULATION     COMPREHENSIVE METABOLIC PANEL     Status: Abnormal   Collection Time   06/13/12  6:37 PM      Component Value Range Comment   Sodium 128 (*) 135 - 145 mEq/L    Potassium 2.8 (*) 3.5 - 5.1 mEq/L    Chloride 89 (*) 96 - 112 mEq/L    CO2 22  19 - 32 mEq/L    Glucose, Bld 93  70 - 99 mg/dL    BUN 81 (*) 6 - 23 mg/dL    Creatinine, Ser 1.61 (*) 0.50 - 1.10 mg/dL    Calcium 8.8  8.4 - 09.6 mg/dL    Total Protein 6.9  6.0 - 8.3 g/dL    Albumin 2.9 (*) 3.5 - 5.2 g/dL    AST 36  0 - 37 U/L    ALT 20  0 - 35 U/L    Alkaline Phosphatase 120 (*) 39 - 117 U/L    Total Bilirubin 0.3  0.3 - 1.2 mg/dL    GFR calc non Af Amer 9 (*) >90 mL/min    GFR calc Af Amer 11 (*) >90 mL/min   LIPASE, BLOOD     Status: Normal   Collection Time   06/13/12  6:37 PM      Component Value Range Comment   Lipase 52  11 - 59 U/L   URINALYSIS, ROUTINE W REFLEX  MICROSCOPIC     Status: Abnormal   Collection Time   06/13/12  8:36 PM      Component Value Range Comment   Color, Urine YELLOW  YELLOW    APPearance CLOUDY (*) CLEAR    Specific Gravity, Urine 1.018  1.005 - 1.030    pH 5.5  5.0 - 8.0    Glucose, UA NEGATIVE  NEGATIVE mg/dL    Hgb urine dipstick MODERATE (*) NEGATIVE    Bilirubin Urine NEGATIVE  NEGATIVE    Ketones, ur NEGATIVE  NEGATIVE mg/dL    Protein, ur 045 (*) NEGATIVE mg/dL    Urobilinogen, UA 0.2  0.0 - 1.0 mg/dL    Nitrite NEGATIVE  NEGATIVE    Leukocytes, UA MODERATE (*) NEGATIVE   URINE MICROSCOPIC-ADD ON     Status: Abnormal   Collection Time   06/13/12  8:36 PM      Component Value Range Comment   Squamous Epithelial / LPF RARE  RARE    WBC, UA 21-50  <3 WBC/hpf    RBC / HPF 3-6  <3 RBC/hpf    Bacteria, UA MANY (*) RARE    Urine-Other AMORPHOUS URATES/PHOSPHATES      Ct Abdomen Pelvis Wo Contrast  06/13/2012  *RADIOLOGY REPORT*  Clinical Data: 2-day history of left flank pain.  Hematuria. Recent diagnosis of urinary tract infection.  CT ABDOMEN AND  PELVIS WITHOUT CONTRAST  Technique:  Multidetector CT imaging of the abdomen and pelvis was performed following the standard protocol without intravenous contrast.  Comparison: None.  Findings: No evidence of urinary tract calculi or obstruction on either side.  Cortical calcification cortical calcification involving the anterior aspect of the lower pole left kidney. Approximate 3.3 x 3.0 cm simple cyst arising from the upper pole of the left kidney.  Approximate 4.2 x 2.6 cm cyst arising from the lateral aspect of the lower pole and approximate 1.3 cm diameter cyst arising from the medial aspect of the lower pole left kidney. Approximate 1.6 cm diameter simple cyst arising from the lower pole right kidney.  Within the limits of the unenhanced technique, no solid masses involving either kidney.  Right kidney mildly atrophic, with compensatory enlargement of the left kidney.  Normal  unenhanced appearance of the liver, spleen, pancreas, and right adrenal gland.  Approximate 1.6 x 1.2 cm low attenuation nodule involving the left adrenal gland.  Gallbladder unremarkable by CT.  No biliary ductal dilation.  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  No significant lymphadenopathy.  Small hiatal hernia.  Stomach decompressed otherwise unremarkable. Normal-appearing small bowel.  Extensive sigmoid colon diverticulosis without evidence of acute diverticulitis.  Remainder the colon unremarkable.  No ascites.  Urinary bladder decompressed and unremarkable.  Uterus atrophic consistent with age.  No adnexal masses or free pelvic fluid.  Bone window images demonstrate degenerative changes involving the lower thoracic and lumbar spine and both hips.  Visualized lung bases emphysematous but clear.  Heart size upper normal.  IMPRESSION:  1.  No evidence of urinary tract calculi or obstruction on either side. 2.  Multiple bilateral renal cysts.  Mildly atrophic right kidney. 3.  Small left adrenal nodule, statistically consistent with an adenoma. 4.  Sigmoid colon diverticulosis without evidence of acute diverticulitis. 5.  Small hiatal hernia.  Original Report Authenticated By: Arnell Sieving, M.D.   Dg Chest 2 View  06/13/2012  *RADIOLOGY REPORT*  Clinical Data: Cough and fever.  CHEST - 2 VIEW  Comparison: 09/18/2011  Findings: Cardiomegaly and hyperinflation again noted. Mild peribronchial thickening is unchanged. There is no evidence of focal airspace disease, pulmonary edema, suspicious pulmonary nodule/mass, pleural effusion, or pneumothorax. No acute bony abnormalities are identified.  IMPRESSION: No evidence of acute cardiopulmonary disease.  Cardiomegaly with mild chronic peribronchial thickening and hyperinflation.  Original Report Authenticated By: Rosendo Gros, M.D.      Assessment:  Present on Admission:  .ARF (acute renal failure) .UTI (urinary tract  infection) .Hypertension .Peripheral arterial disease .Osteoarthritis .Anxiety .Hyponatremia .Hypokalemia .Thrombocytopenia .Leukocytosis   Plan:    Admit to Med/Surg Bed Urine C+S sent, IV Cipro started, adjust PRN Cx result Send Urine Electrolytes IVFs Reconcile Home Medications Monitor Electrolytes and replete PRN DVT Prophylaxis Other plans as per orders.    CODE STATUS:      DO NOT RESUSCITATE (DNR)     DO NOT INTUBATE (DNI)    Jojo Pehl C 06/14/2012, 12:02 AM

## 2012-06-15 DIAGNOSIS — N179 Acute kidney failure, unspecified: Secondary | ICD-10-CM

## 2012-06-15 DIAGNOSIS — R404 Transient alteration of awareness: Secondary | ICD-10-CM

## 2012-06-15 DIAGNOSIS — E860 Dehydration: Secondary | ICD-10-CM

## 2012-06-15 DIAGNOSIS — E782 Mixed hyperlipidemia: Secondary | ICD-10-CM

## 2012-06-15 LAB — BASIC METABOLIC PANEL
Calcium: 8.4 mg/dL (ref 8.4–10.5)
Chloride: 100 mEq/L (ref 96–112)
Creatinine, Ser: 3.25 mg/dL — ABNORMAL HIGH (ref 0.50–1.10)
GFR calc Af Amer: 14 mL/min — ABNORMAL LOW (ref >90)
Sodium: 132 mEq/L — ABNORMAL LOW (ref 135–145)

## 2012-06-15 LAB — CBC
MCH: 29.6 pg (ref 26.0–34.0)
MCV: 87.1 fL (ref 78.0–100.0)
Platelets: 124 10*3/uL — ABNORMAL LOW (ref 150–400)
RDW: 13.6 % (ref 11.5–15.5)
WBC: 16.7 10*3/uL — ABNORMAL HIGH (ref 4.0–10.5)

## 2012-06-15 MED ORDER — LEVOFLOXACIN IN D5W 250 MG/50ML IV SOLN
250.0000 mg | INTRAVENOUS | Status: DC
  Administered 2012-06-15 – 2012-06-17 (×3): 250 mg via INTRAVENOUS
  Filled 2012-06-15 (×4): qty 50

## 2012-06-15 MED ORDER — GUAIFENESIN 100 MG/5ML PO SOLN
10.0000 mL | ORAL | Status: DC | PRN
  Administered 2012-06-15: 200 mg via ORAL
  Filled 2012-06-15: qty 10

## 2012-06-15 MED ORDER — GUAIFENESIN 100 MG/5ML PO SYRP: 200.0000 mg | ORAL_SOLUTION | ORAL | Status: DC | PRN

## 2012-06-15 MED ORDER — LORAZEPAM 0.5 MG PO TABS
0.5000 mg | ORAL_TABLET | Freq: Once | ORAL | Status: AC
  Administered 2012-06-15: 0.5 mg via ORAL
  Filled 2012-06-15: qty 1

## 2012-06-15 NOTE — Consult Note (Signed)
Reason for Consult: CROSS COVER [facility] Referring Physician: Dr. Ricke Hey  [name] is an 78 y.o. female.  HPI: Patient has a history of esophageal strictures and has had multiple dilatations in the past. She has been on PPI's for reflux. She claims she has to cough every time she tries to eat. She has also had diffuse abdominal cramping since admission and feels she cannot get comfortable. She denies a history of melena or hematochezia in the recent past. She tends to have diarrhea alternating with constipation. She can go 2-3 days without a BM when she is not at home. She has a history of diverticulosis, acid reflux and a hiatal hernia. She has been admitted for a UTI with dehydration.  Past Medical History  Diagnosis Date  . Hypertension   . Peripheral arterial disease   . Cognitive disorder   . Esophageal stricture   . Osteoarthritis   . Anxiety   .    Acid reflux/hiatal hernia  Past Surgical History  Procedure Date  . Femoral bypass     Family History  Problem Relation Age of Onset  . Coronary artery disease Father   . Coronary artery disease Mother   . Hypertension Mother   . Breast cancer Maternal Grandmother   . Breast cancer Maternal Aunt    Social History:  reports that she quit smoking about 2 months ago. She does not have any smokeless tobacco history on file. She reports that she does not drink alcohol or use illicit drugs.  Allergies:  Allergies  Allergen Reactions  . Penicillins   . Sulfa Antibiotics    Medications: I have reviewed the patient's current medications.  Results for orders placed during the hospital encounter of 06/13/12 (from the past 48 hour(s))  CBC     Status: Abnormal   Collection Time   06/13/12  6:37 PM      Component Value Range Comment   WBC 17.6 (*) 4.0 - 10.5 K/uL    RBC 4.17  3.87 - 5.11 MIL/uL    Hemoglobin 12.4  12.0 - 15.0 g/dL    HCT 16.1 (*) 09.6 - 46.0 %    MCV 85.9  78.0 - 100.0 fL    MCH 29.7  26.0 - 34.0 pg    MCHC  34.6  30.0 - 36.0 g/dL    RDW 04.5  40.9 - 81.1 %    Platelets 124 (*) 150 - 400 K/uL   DIFFERENTIAL     Status: Abnormal   Collection Time   06/13/12  6:37 PM      Component Value Range Comment   Neutrophils Relative 87 (*) 43 - 77 %    Lymphocytes Relative 4 (*) 12 - 46 %    Monocytes Relative 9  3 - 12 %    Eosinophils Relative 0  0 - 5 %    Basophils Relative 0  0 - 1 %    Neutro Abs 15.3 (*) 1.7 - 7.7 K/uL    Lymphs Abs 0.7  0.7 - 4.0 K/uL    Monocytes Absolute 1.6 (*) 0.1 - 1.0 K/uL    Eosinophils Absolute 0.0  0.0 - 0.7 K/uL    Basophils Absolute 0.0  0.0 - 0.1 K/uL    WBC Morphology TOXIC GRANULATION     COMPREHENSIVE METABOLIC PANEL     Status: Abnormal   Collection Time   06/13/12  6:37 PM      Component Value Range Comment   Sodium 128 (*)  135 - 145 mEq/L    Potassium 2.8 (*) 3.5 - 5.1 mEq/L    Chloride 89 (*) 96 - 112 mEq/L    CO2 22  19 - 32 mEq/L    Glucose, Bld 93  70 - 99 mg/dL    BUN 81 (*) 6 - 23 mg/dL    Creatinine, Ser 4.69 (*) 0.50 - 1.10 mg/dL    Calcium 8.8  8.4 - 62.9 mg/dL    Total Protein 6.9  6.0 - 8.3 g/dL    Albumin 2.9 (*) 3.5 - 5.2 g/dL    AST 36  0 - 37 U/L    ALT 20  0 - 35 U/L    Alkaline Phosphatase 120 (*) 39 - 117 U/L    Total Bilirubin 0.3  0.3 - 1.2 mg/dL    GFR calc non Af Amer 9 (*) >90 mL/min    GFR calc Af Amer 11 (*) >90 mL/min   LIPASE, BLOOD     Status: Normal   Collection Time   06/13/12  6:37 PM      Component Value Range Comment   Lipase 52  11 - 59 U/L   URINALYSIS, ROUTINE W REFLEX MICROSCOPIC     Status: Abnormal   Collection Time   06/13/12  8:36 PM      Component Value Range Comment   Color, Urine YELLOW  YELLOW    APPearance CLOUDY (*) CLEAR    Specific Gravity, Urine 1.018  1.005 - 1.030    pH 5.5  5.0 - 8.0    Glucose, UA NEGATIVE  NEGATIVE mg/dL    Hgb urine dipstick MODERATE (*) NEGATIVE    Bilirubin Urine NEGATIVE  NEGATIVE    Ketones, ur NEGATIVE  NEGATIVE mg/dL    Protein, ur 528 (*) NEGATIVE mg/dL     Urobilinogen, UA 0.2  0.0 - 1.0 mg/dL    Nitrite NEGATIVE  NEGATIVE    Leukocytes, UA MODERATE (*) NEGATIVE   URINE MICROSCOPIC-ADD ON     Status: Abnormal   Collection Time   06/13/12  8:36 PM      Component Value Range Comment   Squamous Epithelial / LPF RARE  RARE    WBC, UA 21-50  <3 WBC/hpf    RBC / HPF 3-6  <3 RBC/hpf    Bacteria, UA MANY (*) RARE    Urine-Other AMORPHOUS URATES/PHOSPHATES     NA AND K (SODIUM & POTASSIUM), RAND UR     Status: Normal   Collection Time   06/14/12  2:05 AM      Component Value Range Comment   Sodium, Ur 39      Potassium Urine Timed 14     OSMOLALITY, URINE     Status: Abnormal   Collection Time   06/14/12  2:05 AM      Component Value Range Comment   Osmolality, Ur 266 (*) 390 - 1090 mOsm/kg   MRSA PCR SCREENING     Status: Normal   Collection Time   06/14/12  2:14 AM      Component Value Range Comment   MRSA by PCR NEGATIVE  NEGATIVE   BASIC METABOLIC PANEL     Status: Abnormal   Collection Time   06/14/12  5:37 AM      Component Value Range Comment   Sodium 130 (*) 135 - 145 mEq/L    Potassium 3.5  3.5 - 5.1 mEq/L    Chloride 96  96 - 112 mEq/L  CO2 19  19 - 32 mEq/L    Glucose, Bld 76  70 - 99 mg/dL    BUN 77 (*) 6 - 23 mg/dL    Creatinine, Ser 4.78 (*) 0.50 - 1.10 mg/dL    Calcium 8.0 (*) 8.4 - 10.5 mg/dL    GFR calc non Af Amer 10 (*) >90 mL/min    GFR calc Af Amer 12 (*) >90 mL/min   CBC     Status: Abnormal   Collection Time   06/14/12  5:37 AM      Component Value Range Comment   WBC 12.6 (*) 4.0 - 10.5 K/uL    RBC 4.03  3.87 - 5.11 MIL/uL    Hemoglobin 11.6 (*) 12.0 - 15.0 g/dL    HCT 29.5 (*) 62.1 - 46.0 %    MCV 85.6  78.0 - 100.0 fL    MCH 28.8  26.0 - 34.0 pg    MCHC 33.6  30.0 - 36.0 g/dL    RDW 30.8  65.7 - 84.6 %    Platelets 107 (*) 150 - 400 K/uL   CBC     Status: Abnormal   Collection Time   06/15/12  5:28 AM      Component Value Range Comment   WBC 16.7 (*) 4.0 - 10.5 K/uL    RBC 3.88  3.87 - 5.11 MIL/uL     Hemoglobin 11.5 (*) 12.0 - 15.0 g/dL    HCT 96.2 (*) 95.2 - 46.0 %    MCV 87.1  78.0 - 100.0 fL    MCH 29.6  26.0 - 34.0 pg    MCHC 34.0  30.0 - 36.0 g/dL    RDW 84.1  32.4 - 40.1 %    Platelets 124 (*) 150 - 400 K/uL   BASIC METABOLIC PANEL     Status: Abnormal   Collection Time   06/15/12  5:28 AM      Component Value Range Comment   Sodium 132 (*) 135 - 145 mEq/L    Potassium 3.9  3.5 - 5.1 mEq/L    Chloride 100  96 - 112 mEq/L    CO2 18 (*) 19 - 32 mEq/L    Glucose, Bld 89  70 - 99 mg/dL    BUN 71 (*) 6 - 23 mg/dL    Creatinine, Ser 0.27 (*) 0.50 - 1.10 mg/dL    Calcium 8.4  8.4 - 25.3 mg/dL    GFR calc non Af Amer 12 (*) >90 mL/min    GFR calc Af Amer 14 (*) >90 mL/min    Ct Abdomen Pelvis Wo Contrast  06/13/2012  *RADIOLOGY REPORT*  Clinical Data: 2-day history of left flank pain.  Hematuria. Recent diagnosis of urinary tract infection.  CT ABDOMEN AND PELVIS WITHOUT CONTRAST  Technique:  Multidetector CT imaging of the abdomen and pelvis was performed following the standard protocol without intravenous contrast.  Comparison: None.  Findings: No evidence of urinary tract calculi or obstruction on either side.  Cortical calcification cortical calcification involving the anterior aspect of the lower pole left kidney. Approximate 3.3 x 3.0 cm simple cyst arising from the upper pole of the left kidney.  Approximate 4.2 x 2.6 cm cyst arising from the lateral aspect of the lower pole and approximate 1.3 cm diameter cyst arising from the medial aspect of the lower pole left kidney. Approximate 1.6 cm diameter simple cyst arising from the lower pole right kidney.  Within the limits of the unenhanced technique, no  solid masses involving either kidney.  Right kidney mildly atrophic, with compensatory enlargement of the left kidney.  Normal unenhanced appearance of the liver, spleen, pancreas, and right adrenal gland.  Approximate 1.6 x 1.2 cm low attenuation nodule involving the left adrenal  gland.  Gallbladder unremarkable by CT.  No biliary ductal dilation.  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  No significant lymphadenopathy.  Small hiatal hernia.  Stomach decompressed otherwise unremarkable. Normal-appearing small bowel.  Extensive sigmoid colon diverticulosis without evidence of acute diverticulitis.  Remainder the colon unremarkable.  No ascites.  Urinary bladder decompressed and unremarkable.  Uterus atrophic consistent with age.  No adnexal masses or free pelvic fluid.  Bone window images demonstrate degenerative changes involving the lower thoracic and lumbar spine and both hips.  Visualized lung bases emphysematous but clear.  Heart size upper normal.  IMPRESSION:  1.  No evidence of urinary tract calculi or obstruction on either side. 2.  Multiple bilateral renal cysts.  Mildly atrophic right kidney. 3.  Small left adrenal nodule, statistically consistent with an adenoma. 4.  Sigmoid colon diverticulosis without evidence of acute diverticulitis. 5.  Small hiatal hernia.  Original Report Authenticated By: Arnell Sieving, M.D.   Dg Chest 2 View  06/13/2012  *RADIOLOGY REPORT*  Clinical Data: Cough and fever.  CHEST - 2 VIEW  Comparison: 09/18/2011  Findings: Cardiomegaly and hyperinflation again noted. Mild peribronchial thickening is unchanged. There is no evidence of focal airspace disease, pulmonary edema, suspicious pulmonary nodule/mass, pleural effusion, or pneumothorax. No acute bony abnormalities are identified.  IMPRESSION: No evidence of acute cardiopulmonary disease.  Cardiomegaly with mild chronic peribronchial thickening and hyperinflation.  Original Report Authenticated By: Rosendo Gros, M.D.   Review of Systems  HENT: Negative for neck pain.   Respiratory: Negative.   Cardiovascular: Negative.   Gastrointestinal: Positive for heartburn and abdominal pain. Negative for nausea, vomiting, blood in stool and melena.  Musculoskeletal: Positive  for myalgias, back pain and joint pain.  Neurological: Negative for focal weakness, seizures and loss of consciousness.  Psychiatric/Behavioral: Positive for memory loss. Negative for suicidal ideas, hallucinations and substance abuse. The patient has insomnia. The patient is not nervous/anxious.    Blood pressure 144/70, pulse 89, temperature 98.8 F (37.1 C), temperature source Oral, resp. rate 20, height 5' (1.524 m), weight 41.3 kg (91 lb 0.8 oz), SpO2 94.00%. Physical Exam  Constitutional: Vital signs are normal. She appears cachectic. She is cooperative.  Non-toxic appearance. She does not have a sickly appearance. She does not appear ill. No distress.  HENT:  Head: Normocephalic and atraumatic.  Neck: Trachea normal. Neck supple. Normal carotid pulses, no hepatojugular reflux and no JVD present. Carotid bruit is not present. No mass and no thyromegaly present.  Cardiovascular: Normal rate and regular rhythm.   Respiratory: Effort normal and breath sounds normal.  GI: Soft. Normal appearance. She exhibits no distension, no fluid wave and no ascites. There is no hepatosplenomegaly. There is generalized tenderness. There is no rebound and no CVA tenderness.  Neurological: She is alert.  Skin: Bruising, ecchymosis and rash noted. No petechiae and no purpura noted. Rash is not papular, not pustular, not vesicular and not urticarial. No cyanosis.   Assessment/Plan: A/P1) Worsening dysphagia/acid reflux/hiatal hernia: Will plan an EGD prior to discharge. Patient is on anticoagulants. Dr. Juanda Chance to see in AM and decide the timing of her procedure.  2) UTI with dehydration: Elevated BUN/Cr. On Levaquin. 3) Sigmoid diverticulosis without diverticulitis.   4) Constipation predominant  IBS.  5) Cognitive disorder: on Requip, Aricept and Remeron.  6) COPD: On Albuterol and Advair.  Zared Knoth 06/15/2012, 10:53 AM

## 2012-06-15 NOTE — Progress Notes (Signed)
Subjective: Coughing more frequently, particularly, after oral intake.  Objective: Vital signs in last 24 hours: Temp:  [98.1 F (36.7 C)-98.8 F (37.1 C)] 98.8 F (37.1 C) (06/23 0425) Pulse Rate:  [81-93] 89  (06/23 0510) Resp:  [19-20] 20  (06/23 0425) BP: (123-144)/(57-70) 144/70 mmHg (06/23 0425) SpO2:  [91 %-96 %] 94 % (06/23 0929) Weight change:  Last BM Date: 06/13/12  Intake/Output from previous day: 06/22 0701 - 06/23 0700 In: 1484.8 [P.O.:480; I.V.:1004.8] Out: 550 [Urine:550]     Physical Exam: General: Comfortable, alert, very garrulous and communicative, not short of breath at rest, has frequent cough. HEENT:  Mild clinical pallor, no jaundice, no conjunctival injection or discharge. Hydration appears to have improved.  NECK:  Supple, JVP not seen, no carotid bruits, no palpable lymphadenopathy, no palpable goiter. CHEST:  Clinically clear to auscultation, no wheezes, no crackles. HEART:  Sounds 1 and 2 heard, normal, regular, no murmurs. ABDOMEN:  Flat, soft, non-tender, no palpable organomegaly, no palpable masses, normal bowel sounds. GENITALIA:  Not examined. LOWER EXTREMITIES:  No pitting edema, palpable peripheral pulses. MUSCULOSKELETAL SYSTEM:  Generalized osteoarthritic changes, otherwise, normal. CENTRAL NERVOUS SYSTEM:  No focal neurologic deficit on gross examination.  Lab Results:  Basename 06/15/12 0528 06/14/12 0537  WBC 16.7* 12.6*  HGB 11.5* 11.6*  HCT 33.8* 34.5*  PLT 124* 107*    Basename 06/15/12 0528 06/14/12 0537  NA 132* 130*  K 3.9 3.5  CL 100 96  CO2 18* 19  GLUCOSE 89 76  BUN 71* 77*  CREATININE 3.25* 3.68*  CALCIUM 8.4 8.0*   Recent Results (from the past 240 hour(s))  MRSA PCR SCREENING     Status: Normal   Collection Time   06/14/12  2:14 AM      Component Value Range Status Comment   MRSA by PCR NEGATIVE  NEGATIVE Final      Studies/Results: Ct Abdomen Pelvis Wo Contrast  06/13/2012  *RADIOLOGY REPORT*   Clinical Data: 2-day history of left flank pain.  Hematuria. Recent diagnosis of urinary tract infection.  CT ABDOMEN AND PELVIS WITHOUT CONTRAST  Technique:  Multidetector CT imaging of the abdomen and pelvis was performed following the standard protocol without intravenous contrast.  Comparison: None.  Findings: No evidence of urinary tract calculi or obstruction on either side.  Cortical calcification cortical calcification involving the anterior aspect of the lower pole left kidney. Approximate 3.3 x 3.0 cm simple cyst arising from the upper pole of the left kidney.  Approximate 4.2 x 2.6 cm cyst arising from the lateral aspect of the lower pole and approximate 1.3 cm diameter cyst arising from the medial aspect of the lower pole left kidney. Approximate 1.6 cm diameter simple cyst arising from the lower pole right kidney.  Within the limits of the unenhanced technique, no solid masses involving either kidney.  Right kidney mildly atrophic, with compensatory enlargement of the left kidney.  Normal unenhanced appearance of the liver, spleen, pancreas, and right adrenal gland.  Approximate 1.6 x 1.2 cm low attenuation nodule involving the left adrenal gland.  Gallbladder unremarkable by CT.  No biliary ductal dilation.  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  No significant lymphadenopathy.  Small hiatal hernia.  Stomach decompressed otherwise unremarkable. Normal-appearing small bowel.  Extensive sigmoid colon diverticulosis without evidence of acute diverticulitis.  Remainder the colon unremarkable.  No ascites.  Urinary bladder decompressed and unremarkable.  Uterus atrophic consistent with age.  No adnexal masses or free pelvic fluid.  Bone window  images demonstrate degenerative changes involving the lower thoracic and lumbar spine and both hips.  Visualized lung bases emphysematous but clear.  Heart size upper normal.  IMPRESSION:  1.  No evidence of urinary tract calculi or obstruction  on either side. 2.  Multiple bilateral renal cysts.  Mildly atrophic right kidney. 3.  Small left adrenal nodule, statistically consistent with an adenoma. 4.  Sigmoid colon diverticulosis without evidence of acute diverticulitis. 5.  Small hiatal hernia.  Original Report Authenticated By: Arnell Sieving, M.D.   Dg Chest 2 View  06/13/2012  *RADIOLOGY REPORT*  Clinical Data: Cough and fever.  CHEST - 2 VIEW  Comparison: 09/18/2011  Findings: Cardiomegaly and hyperinflation again noted. Mild peribronchial thickening is unchanged. There is no evidence of focal airspace disease, pulmonary edema, suspicious pulmonary nodule/mass, pleural effusion, or pneumothorax. No acute bony abnormalities are identified.  IMPRESSION: No evidence of acute cardiopulmonary disease.  Cardiomegaly with mild chronic peribronchial thickening and hyperinflation.  Original Report Authenticated By: Rosendo Gros, M.D.    Medications: Scheduled Meds:    . sodium chloride   Intravenous STAT  . antiseptic oral rinse  15 mL Mouth Rinse BID  . aspirin  81 mg Oral Daily  . ciprofloxacin  200 mg Intravenous Q12H  . clopidogrel  75 mg Oral Daily  . dipyridamole  100 mg Oral BID  . donepezil  5 mg Oral QHS  . Fluticasone-Salmeterol  1 puff Inhalation Q12H  . LORazepam  0.5 mg Oral Once  . metoprolol tartrate  25 mg Oral BID  . mirtazapine  7.5 mg Oral QHS  . nystatin  5 mL Oral QID  . pantoprazole  40 mg Oral Q0600  . rOPINIRole  0.5 mg Oral q1800  . simvastatin  10 mg Oral q1800  . sodium chloride  2 spray Nasal q morning - 10a   Continuous Infusions:    . sodium chloride 75 mL/hr at 06/15/12 0555   PRN Meds:.acetaminophen, acetaminophen, albuterol, alum & mag hydroxide-simeth, guaiFENesin, HYDROmorphone (DILAUDID) injection, ondansetron (ZOFRAN) IV, ondansetron, oxyCODONE, zolpidem, DISCONTD: guaifenesin  Assessment/Plan:  Active Problems: 1. UTI (urinary tract infection): Patient was found to have a UTI in  the facility and was placed on Bactrim 2 days ago, and now presents with fever and chills. Urinalysis showed positive sediment, with pyuria and significant bacteriuria. She has been commenced on iv Ciprofloxacin, now day#2, and cultures are pending. She remains apyrexial.  Will change antibiotic to Levaquin, to provide better coverage for possible respiratory tract issue, in addition. 2. Dehydration/ARF (acute renal failure): Patient presented with BUN 81 and creatinine 3.93, consistent with dehydration and ARF, against a background of UTI, poor oral intake and diuretics. Unfortunately, baseline creatinine is unknown at this time. We are managing with iv fluids, diuretics have been discontinued, and creatinine has already shown some improvement at 3.25 today. Following renal indices.  3. Hyponatremia: Patient had a hyponatremia of 128 at presentation, secondary to diuretics and volume depletion. Managing as described above, and sodium is 132 today. 4. Hypokalemia: Repleting as indicated. 5. Hypertension: BP is reasonably controlled, at this time.  6. Peripheral arterial disease: Not problematic.  7. Anxiety/Dementia: Stable on pre-admission psychotropics.  8. Dyslipidemia: Continue Statin. 9. Esophageal stricture: Patient gets periodic esophageal dilatations, by primary GI , Dr Katrinka Blazing in [place]. According to her, this is due soon. Patient continues to cough frequently. Will invite GI input.       LOS: 2 days   Jasson Siegmann,CHRISTOPHER 06/15/2012, 9:59 AM

## 2012-06-16 ENCOUNTER — Inpatient Hospital Stay (HOSPITAL_COMMUNITY): Payer: Medicare Other

## 2012-06-16 DIAGNOSIS — N179 Acute kidney failure, unspecified: Secondary | ICD-10-CM

## 2012-06-16 DIAGNOSIS — R404 Transient alteration of awareness: Secondary | ICD-10-CM

## 2012-06-16 DIAGNOSIS — E782 Mixed hyperlipidemia: Secondary | ICD-10-CM

## 2012-06-16 DIAGNOSIS — E860 Dehydration: Secondary | ICD-10-CM

## 2012-06-16 LAB — CBC
Hemoglobin: 11.3 g/dL — ABNORMAL LOW (ref 12.0–15.0)
MCH: 29.1 pg (ref 26.0–34.0)
MCV: 87.9 fL (ref 78.0–100.0)
RBC: 3.88 MIL/uL (ref 3.87–5.11)

## 2012-06-16 LAB — COMPREHENSIVE METABOLIC PANEL
BUN: 57 mg/dL — ABNORMAL HIGH (ref 6–23)
CO2: 19 mEq/L (ref 19–32)
Calcium: 8.4 mg/dL (ref 8.4–10.5)
Creatinine, Ser: 2.52 mg/dL — ABNORMAL HIGH (ref 0.50–1.10)
GFR calc Af Amer: 19 mL/min — ABNORMAL LOW (ref >90)
GFR calc non Af Amer: 16 mL/min — ABNORMAL LOW (ref >90)
Glucose, Bld: 88 mg/dL (ref 70–99)

## 2012-06-16 MED ORDER — ALPRAZOLAM 0.25 MG PO TABS
0.2500 mg | ORAL_TABLET | Freq: Two times a day (BID) | ORAL | Status: DC | PRN
  Administered 2012-06-16 – 2012-06-21 (×7): 0.25 mg via ORAL
  Filled 2012-06-16 (×7): qty 1

## 2012-06-16 MED ORDER — ENSURE COMPLETE PO LIQD
237.0000 mL | Freq: Two times a day (BID) | ORAL | Status: DC
  Administered 2012-06-16 – 2012-06-23 (×14): 237 mL via ORAL

## 2012-06-16 NOTE — Progress Notes (Signed)
Clinical Social Work Department BRIEF PSYCHOSOCIAL ASSESSMENT 06/16/2012  Patient:  [name], [name]     Account Number:  [id number]     Admit date:  06/13/2012  Clinical Social Worker:  Candie Chroman  Date/Time:  06/16/2012 02:58 PM  Referred by:  Physician  Date Referred:  06/16/2012 Referred for  ALF Placement   Other Referral:   Interview type:  Patient Other interview type:    PSYCHOSOCIAL DATA Living Status:  FACILITY Admitted from facility:  [facility] [address] Level of care:  Assisted Living Primary support name:  [name] Primary support relationship to patient:  CHILD, ADULT Degree of support available:   supportive    CURRENT CONCERNS Current Concerns  Post-Acute Placement   Other Concerns:    SOCIAL WORK ASSESSMENT / PLAN Pt is an 78 yr old female admitted from [facility]. Met with pt/spoke with pt's daughter [name] ) to assist with d/c planning. Pt plans to return to [facility] upon hospital d/c. [name] from ALF contacted and d/c plan confirmed. Clinical info sent to ALF for review. CSW will continue to follow to assist with d/c planning to [facility].   Assessment/plan status:  Psychosocial Support/Ongoing Assessment of Needs Other assessment/ plan:   Information/referral to community resources:   None needed at this time.    PATIENT'S/FAMILY'S RESPONSE TO PLAN OF CARE: Pt/family hopeful for return to [facility] at D/C.   Cori Razor LCSW [phone]

## 2012-06-16 NOTE — Progress Notes (Signed)
INITIAL ADULT NUTRITION ASSESSMENT Date: 06/16/2012   Time: 2:58 PM Reason for Assessment: Nutrition risk-dysphagai  ASSESSMENT: Female 78 y.o.  Dx: ARF (acute renal failure)/dehydration, UTI, Hyponatremia, oral thrush  Hx:  Past Medical History  Diagnosis Date  . Hypertension   . Peripheral arterial disease   . Cognitive disorder   . Esophageal stricture   . Osteoarthritis   . Anxiety    Past Surgical History  Procedure Date  . Femoral bypass     Related Meds: plavix, levaquin, remeron, protonix, requip, zocor  Ht: 5' (152.4 cm)  Wt: 91 lb 0.8 oz (41.3 kg)  Ideal Wt: 45.5 kg  % Ideal Wt: 91  Usual Wt: unknown  Body mass index is 17.78 kg/(m^2).  Food/Nutrition Related Hx: Decreased appetite for some time but tried to eat well.  Labs: * CMP     Component Value Date/Time   NA 133* 06/16/2012 0436   K 3.9 06/16/2012 0436   CL 102 06/16/2012 0436   CO2 19 06/16/2012 0436   GLUCOSE 88 06/16/2012 0436   BUN 57* 06/16/2012 0436   CREATININE 2.52* 06/16/2012 0436   CALCIUM 8.4 06/16/2012 0436   PROT 5.8* 06/16/2012 0436   ALBUMIN 2.2* 06/16/2012 0436   AST 27 06/16/2012 0436   ALT 16 06/16/2012 0436   ALKPHOS 92 06/16/2012 0436   BILITOT 0.4 06/16/2012 0436   GFRNONAA 16* 06/16/2012 0436   GFRAA 19* 06/16/2012 0436    I/O last 3 completed shifts: In: 3486.3 [P.O.:600; I.V.:2836.3; IV Piggyback:50] Out: 1675 [Urine:1675]     Diet Order: Dysphagia 3  Supplements/Tube Feeding:  none  IVF:    sodium chloride Last Rate: 75 mL/hr at 06/15/12 2106    Estimated Nutritional Needs:   Kcal: 1200-1350 Protein: 50-60 g Fluid: >1.3L  Pt taking liquids very well but solid intake limited secondary to pain from thrush.  SLP saw to evaluate swallowing.  Pt underweight on admit.  Decreased muscle mass and body fat.  NUTRITION DIAGNOSIS: -Inadequate oral intake (NI-2.1).  Status: Ongoing  RELATED TO: thrush  AS EVIDENCE BY: pt report and documented poor  intek  MONITORING/EVALUATION(Goals): Goal:  Maximize intake to prevent weight loss Monitor:  Weight, intake, labs.  EDUCATION NEEDS: -No education needs identified at this time  INTERVENTION: Ensure bid, vanilla Magic cup bid with meals.   Dietitian [phone]  DOCUMENTATION CODES Per approved criteria  -Underweight    Reshaun Briseno, Anastasia Fiedler 06/16/2012, 2:58 PM

## 2012-06-16 NOTE — Progress Notes (Addendum)
Subjective: Still coughing. Otherwise, no new issues.  Objective: Vital signs in last 24 hours: Temp:  [98.2 F (36.8 C)-99.2 F (37.3 C)] 98.3 F (36.8 C) (06/24 0735) Pulse Rate:  [68-87] 84  (06/24 0735) Resp:  [16-19] 18  (06/24 0735) BP: (122-152)/(56-97) 146/66 mmHg (06/24 0735) SpO2:  [94 %-97 %] 95 % (06/24 0735) Weight change:  Last BM Date: 06/16/12  Intake/Output from previous day: 06/23 0701 - 06/24 0700 In: 3486.3 [P.O.:600; I.V.:2836.3; IV Piggyback:50] Out: 1675 [Urine:1675]     Physical Exam: General: Comfortable, alert, communicative, not short of breath at rest, has frequent cough. HEENT:  Mild clinical pallor, no jaundice, no conjunctival injection or discharge. Hydration is fair.  NECK:  Supple, JVP not seen, no carotid bruits, no palpable lymphadenopathy, no palpable goiter. CHEST:  Clinically clear to auscultation, no wheezes, no crackles. HEART:  Sounds 1 and 2 heard, normal, regular, no murmurs. ABDOMEN:  Flat, soft, non-tender, no palpable organomegaly, no palpable masses, normal bowel sounds. GENITALIA:  Not examined. LOWER EXTREMITIES:  No pitting edema, palpable peripheral pulses. MUSCULOSKELETAL SYSTEM:  Generalized osteoarthritic changes, otherwise, normal. CENTRAL NERVOUS SYSTEM:  No focal neurologic deficit on gross examination.  Lab Results:  [facility] 06/16/12 0436 06/15/12 0528  WBC 15.7* 16.7*  HGB 11.3* 11.5*  HCT 34.1* 33.8*  PLT 137* 124*    Basename 06/16/12 0436 06/15/12 0528  NA 133* 132*  K 3.9 3.9  CL 102 100  CO2 19 18*  GLUCOSE 88 89  BUN 57* 71*  CREATININE 2.52* 3.25*  CALCIUM 8.4 8.4   Recent Results (from the past 240 hour(s))  MRSA PCR SCREENING     Status: Normal   Collection Time   06/14/12  2:14 AM      Component Value Range Status Comment   MRSA by PCR NEGATIVE  NEGATIVE Final      Studies/Results: No results found.  Medications: Scheduled Meds:    . antiseptic oral rinse  15 mL Mouth Rinse BID    . aspirin  81 mg Oral Daily  . clopidogrel  75 mg Oral Daily  . dipyridamole  100 mg Oral BID  . donepezil  5 mg Oral QHS  . Fluticasone-Salmeterol  1 puff Inhalation Q12H  . levofloxacin (LEVAQUIN) IV  250 mg Intravenous Q24H  . metoprolol tartrate  25 mg Oral BID  . mirtazapine  7.5 mg Oral QHS  . nystatin  5 mL Oral QID  . pantoprazole  40 mg Oral Q0600  . rOPINIRole  0.5 mg Oral q1800  . simvastatin  10 mg Oral q1800  . sodium chloride  2 spray Nasal q morning - 10a  . DISCONTD: ciprofloxacin  200 mg Intravenous Q12H   Continuous Infusions:    . sodium chloride 75 mL/hr at 06/15/12 2106   PRN Meds:.acetaminophen, acetaminophen, albuterol, alum & mag hydroxide-simeth, guaiFENesin, HYDROmorphone (DILAUDID) injection, ondansetron (ZOFRAN) IV, ondansetron, oxyCODONE, zolpidem  Assessment/Plan:  Active Problems: 1. UTI (urinary tract infection): Patient was found to have a UTI in the facility and was placed on Bactrim 2 days ago, and presented with fever and chills. Urinalysis showed positive sediment, with pyuria and significant bacteriuria. She was initially commenced on iv Ciprofloxacin, but was transitioned to Levaquin on 06/15/12, to add some respiratory tract coverage (now day#3 antibiotics). She remains apyrexial. 2. Dehydration/ARF (acute renal failure): Patient presented with BUN 81 and creatinine 3.93, consistent with dehydration and ARF, against a background of UTI, poor oral intake and diuretics. Unfortunately, baseline creatinine is  unknown at this time. We are managing with iv fluids, diuretics have been discontinued, and creatinine has already shown some improvement at 2.52 today. Following renal indices.  3. Hyponatremia: Patient had a hyponatremia of 128 at presentation, secondary to diuretics and volume depletion. Managing as described above, and sodium is 133 today. 4. Hypokalemia: Repleted as indicated. Now resolved. 5. Hypertension: BP is reasonably controlled, at  this time, on beta-blocker. Will not escalate further, as per daughter, patient sometimes becomes hypotensive, with more aggressive BP control.  6. Peripheral arterial disease: Not problematic.  7. Anxiety/Dementia: Stable on pre-admission psychotropics.  8. Dyslipidemia: Continue Statin. 9. Esophageal stricture: Patient gets periodic esophageal dilatations, by primary GI , Dr Katrinka Blazing in [place]. According to her, this is due soon. Patient continues to cough frequently. Dr Charna Elizabeth provided GI consultation, and barium esophagogram has been arranged for today. Patient will have endoscopic evaluation prior to discharge. 10. Oral thrush: This was an incidental finding. Patient was commenced on Nystatin suspension on 06/14/12.        LOS: 3 days   Brixon Zhen,CHRISTOPHER 06/16/2012, 9:16 AM

## 2012-06-16 NOTE — Evaluation (Signed)
Clinical/Bedside Swallow Evaluation Patient Details  Name: [name] MRN: [id number] Date of Birth: May 26, 1922  Today's Date: 06/16/2012 Time: 7829-5621 SLP Time Calculation (min): 20 min  Past Medical History:  Past Medical History  Diagnosis Date  . Hypertension   . Peripheral arterial disease   . Cognitive disorder   . Esophageal stricture   . Osteoarthritis   . Anxiety    Past Surgical History:  Past Surgical History  Procedure Date  . Femoral bypass    HPI:  78 year old female with PMH of esophageal strictures and has had multiple dilatations in the past. She has been on PPI's for reflux. She claims she has to cough every time she tries to eat. She has also had diffuse abdominal cramping since admission and feels she cannot get comfortable. She denies a history of melena or hematochezia in the recent past. She tends to have diarrhea alternating with constipation. She can go 2-3 days without a BM when she is not at home. She has a history of diverticulosis, acid reflux and a hiatal hernia. She has been admitted for a UTI with dehydration.   Assessment / Plan / Recommendation Clinical Impression  Suspect a primary esophageal dysphagia with a secondary oral component resulting in c/o worsening swallowing function at this time. Patient with wheezy, baseline coughing noted with occassional expectoration of clear secretions however this coughing did not correlate with po intake during today's exam. Oropharyngeal swallow appears age appropriate and functional at this time however cannot r/o impact of esophageal dysfunction and possible oral thrush on swallow at this time. Agree with GI in proceeding with barium swallow (orignially ordered as MBS but corrected by MD) to assess esophageal function. Educated patient and RN on esophageal swallowing precautions to decrease aspiration risk. SLP will f/u after barium swallow to determine need for further assessment of oropharynx and f/u  education.      Aspiration Risk  Moderate    Diet Recommendation Dysphagia 3 (Mechanical Soft);Thin liquid   Liquid Administration via: Cup;Straw Medication Administration: Whole meds with liquid (one at a time) Supervision: Patient able to self feed;Intermittent supervision to cue for compensatory strategies Compensations: Slow rate;Small sips/bites;Follow solids with liquid Postural Changes and/or Swallow Maneuvers: Seated upright 90 degrees;Upright 30-60 min after meal    Other  Recommendations Recommended Consults: Other (Comment) (barium swallow) Oral Care Recommendations: Oral care BID   Follow Up Recommendations  Other (comment) (TBD)    Frequency and Duration min 2x/week  2 weeks   Pertinent Vitals/Pain n/a    SLP Swallow Goals Patient will consume recommended diet without observed clinical signs of aspiration with: Modified independent assistance Swallow Study Goal #1 - Progress: Not Met Patient will utilize recommended strategies during swallow to increase swallowing safety with: Modified independent assistance Swallow Study Goal #2 - Progress: Not met   Swallow Study    General HPI: 78 year old female with PMH of esophageal strictures and has had multiple dilatations in the past. She has been on PPI's for reflux. She claims she has to cough every time she tries to eat. She has also had diffuse abdominal cramping since admission and feels she cannot get comfortable. She denies a history of melena or hematochezia in the recent past. She tends to have diarrhea alternating with constipation. She can go 2-3 days without a BM when she is not at home. She has a history of diverticulosis, acid reflux and a hiatal hernia. She has been admitted for a UTI with dehydration. Type  of Study: Bedside swallow evaluation Previous Swallow Assessment: per patient, most recent esophageal dilation was at least 1 year ago Diet Prior to this Study: Regular;Thin liquids Temperature Spikes  Noted: No Respiratory Status: Supplemental O2 delivered via (comment) (1 L nasal cannula) History of Recent Intubation: No Behavior/Cognition: Alert;Cooperative;Pleasant mood (mildly SOB, requested breathing treatment however resolved) Oral Cavity - Dentition: Adequate natural dentition (upper and lower partials) Self-Feeding Abilities: Able to feed self Patient Positioning: Upright in bed Baseline Vocal Quality: Hoarse Volitional Cough: Strong Volitional Swallow: Able to elicit    Oral/Motor/Sensory Function Overall Oral Motor/Sensory Function: Appears within functional limits for tasks assessed   Ice Chips Ice chips: Not tested   Thin Liquid Thin Liquid: Within functional limits Presentation: Cup;Straw    Nectar Thick Nectar Thick Liquid: Not tested   Honey Thick Honey Thick Liquid: Not tested   Puree Puree: Within functional limits Presentation: Spoon   Solid Solid: Impaired Presentation: Self Fed Oral Phase Impairments: Impaired anterior to posterior transit Oral Phase Functional Implications: Other (comment) (oral delay due to sore lips and tongue, WFL when moistened)   Ferdinand Lango MA, CCC-SLP [phone]  Maycel Riffe [name] 06/16/2012,9:17 AM

## 2012-06-16 NOTE — Progress Notes (Signed)
   CARE MANAGEMENT NOTE 06/16/2012  Patient:  [name], [name]   Account Number:  [id number]  Date Initiated:  06/16/2012  Documentation initiated by:  Jiles Crocker  Subjective/Objective Assessment:   ADMITTED WITH ABDOMINAL AND BACK PAIN; UTI     Action/Plan:   PATIENT IS A RESIDENT OF A NURSING FACILITY; SOCIAL WORKER REFERRAL PLACED   Anticipated DC Date:  06/23/2012   Anticipated DC Plan:  SKILLED NURSING FACILITY  In-house referral  Clinical Social Worker      DC Planning Services  CM consult               Status of service:  In process, will continue to follow Medicare Important Message given?   (If response is "NO", the following Medicare IM given date fields will be blank)  Per UR Regulation:  Reviewed for med. necessity/level of care/duration of stay  Comments:  06/16/2012- B Johnanthony Wilden RN, BSN, MHA

## 2012-06-16 NOTE — Progress Notes (Signed)
Subjective: Since I last evaluated the patient, there has been no significant change in her condition. She is due to have a barium swallow today.  Objective: Vital signs in last 24 hours: Temp:  [98.4 F (36.9 C)-99.2 F (37.3 C)] 98.4 F (36.9 C) (06/24 0120) Pulse Rate:  [80-87] 80  (06/24 0120) Resp:  [16-19] 16  (06/24 0120) BP: (122-152)/(56-67) 122/56 mmHg (06/24 0120) SpO2:  [94 %-97 %] 96 % (06/24 0120) Last BM Date: 06/15/12  Intake/Output from previous day: 06/23 0701 - 06/24 0700 In: 2743.8 [P.O.:600; I.V.:2093.8; IV Piggyback:50] Out: 1375 [Urine:1375] Intake/Output this shift: Total I/O In: 2333.8 [P.O.:240; I.V.:2093.8] Out: 525 [Urine:525]  Patient is asleep and therefore I did not wake her up.  Lab Results:  Basename 06/16/12 0436 06/15/12 0528 06/14/12 0537  WBC 15.7* 16.7* 12.6*  HGB 11.3* 11.5* 11.6*  HCT 34.1* 33.8* 34.5*  PLT 137* 124* 107*   BMET  Basename 06/16/12 0436 06/15/12 0528 06/14/12 0537  NA 133* 132* 130*  K 3.9 3.9 3.5  CL 102 100 96  CO2 19 18* 19  GLUCOSE 88 89 76  BUN 57* 71* 77*  CREATININE 2.52* 3.25* 3.68*  CALCIUM 8.4 8.4 8.0*   LFT  Basename 06/16/12 0436  PROT 5.8*  ALBUMIN 2.2*  AST 27  ALT 16  ALKPHOS 92  BILITOT 0.4  BILIDIR --  IBILI --    Medications: I have reviewed the patient's current medications.  Assessment/Plan: Dysphagia/Cough/History of esophageal strictures. Await results of barium swallow today.  LOS: 3 days   [name] 06/16/2012, 6:06 AM

## 2012-06-17 DIAGNOSIS — N179 Acute kidney failure, unspecified: Secondary | ICD-10-CM

## 2012-06-17 DIAGNOSIS — E860 Dehydration: Secondary | ICD-10-CM

## 2012-06-17 DIAGNOSIS — E782 Mixed hyperlipidemia: Secondary | ICD-10-CM

## 2012-06-17 DIAGNOSIS — R404 Transient alteration of awareness: Secondary | ICD-10-CM

## 2012-06-17 LAB — CBC
Hemoglobin: 10.2 g/dL — ABNORMAL LOW (ref 12.0–15.0)
MCHC: 33 g/dL (ref 30.0–36.0)
RBC: 3.53 MIL/uL — ABNORMAL LOW (ref 3.87–5.11)

## 2012-06-17 LAB — BASIC METABOLIC PANEL
GFR calc non Af Amer: 22 mL/min — ABNORMAL LOW (ref >90)
Glucose, Bld: 98 mg/dL (ref 70–99)
Potassium: 3.7 mEq/L (ref 3.5–5.1)
Sodium: 136 mEq/L (ref 135–145)

## 2012-06-17 LAB — URINE CULTURE

## 2012-06-17 MED ORDER — LEVOFLOXACIN 250 MG PO TABS
250.0000 mg | ORAL_TABLET | Freq: Every day | ORAL | Status: AC
  Administered 2012-06-18 – 2012-06-20 (×3): 250 mg via ORAL
  Filled 2012-06-17 (×3): qty 1

## 2012-06-17 NOTE — Progress Notes (Signed)
Subjective: No acute events.  Objective: Vital signs in last 24 hours: Temp:  [97.4 F (36.3 C)-98.3 F (36.8 C)] 98.2 F (36.8 C) (06/25 1500) Pulse Rate:  [76-91] 76  (06/25 1500) Resp:  [18] 18  (06/25 1500) BP: (140-176)/(54-67) 140/54 mmHg (06/25 1500) SpO2:  [92 %-96 %] 96 % (06/25 1500) Last BM Date: 06/16/12  Intake/Output from previous day: 06/24 0701 - 06/25 0700 In: 750 [I.V.:750] Out: 450 [Urine:450] Intake/Output this shift: Total I/O In: 360 [P.O.:360] Out: 200 [Urine:200]  General appearance: alert and no distress GI: soft, non-tender; bowel sounds normal; no masses,  no organomegaly  Lab Results:  Basename 06/17/12 0425 06/16/12 0436 06/15/12 0528  WBC 11.6* 15.7* 16.7*  HGB 10.2* 11.3* 11.5*  HCT 30.9* 34.1* 33.8*  PLT 162 137* 124*   BMET  Basename 06/17/12 0425 06/16/12 0436 06/15/12 0528  NA 136 133* 132*  K 3.7 3.9 3.9  CL 107 102 100  CO2 19 19 18*  GLUCOSE 98 88 89  BUN 43* 57* 71*  CREATININE 1.93* 2.52* 3.25*  CALCIUM 8.0* 8.4 8.4   LFT  Basename 06/16/12 0436  PROT 5.8*  ALBUMIN 2.2*  AST 27  ALT 16  ALKPHOS 92  BILITOT 0.4  BILIDIR --  IBILI --   PT/INR No results found for this basename: LABPROT:2,INR:2 in the last 72 hours Hepatitis Panel No results found for this basename: HEPBSAG,HCVAB,HEPAIGM,HEPBIGM in the last 72 hours C-Diff No results found for this basename: CDIFFTOX:3 in the last 72 hours Fecal Lactopherrin No results found for this basename: FECLLACTOFRN in the last 72 hours  Studies/Results: Dg Esophagus  06/16/2012  *RADIOLOGY REPORT*  Clinical Data: Esophageal stricture, cough, dysphasia  ESOPHOGRAM/BARIUM SWALLOW  Technique:  Single contrast examination was performed using thin barium.  Fluoroscopy time:  1.2 minutes.  Comparison:  None.  Findings:  Marked esophageal dysmotility with numerous tertiary/nonperistaltic contractions and/or diffuse esophageal spasm.  Although suboptimally evaluated, the left  lateral aspect of the mid/distal esophagus appears irregular (series 8/image 1), underlying mucosal lesion not excluded.  No definite fixed esophageal narrowing or stricture.  However, the 13 mm barium tablet failed to pass the mid esophagus.  IMPRESSION: Marked esophageal dysmotility with possible diffuse esophageal spasm.  Mucosal irregularity involving the left lateral aspect of the mid/distal esophagus. Endoscopic correlation is suggested to exclude underlying mucosal lesion.  No definite fixed esophageal narrowing or stricture.  However, the 13 mm barium tablet failed to pass the mid esophagus.  Original Report Authenticated By: Charline Bills, M.D.    Medications:  Scheduled:   . antiseptic oral rinse  15 mL Mouth Rinse BID  . aspirin  81 mg Oral Daily  . clopidogrel  75 mg Oral Daily  . dipyridamole  100 mg Oral BID  . donepezil  5 mg Oral QHS  . feeding supplement  237 mL Oral BID BM  . Fluticasone-Salmeterol  1 puff Inhalation Q12H  . levofloxacin  250 mg Oral Daily  . mirtazapine  7.5 mg Oral QHS  . nystatin  5 mL Oral QID  . pantoprazole  40 mg Oral Q0600  . rOPINIRole  0.5 mg Oral q1800  . simvastatin  10 mg Oral q1800  . sodium chloride  2 spray Nasal q morning - 10a  . DISCONTD: levofloxacin (LEVAQUIN) IV  250 mg Intravenous Q24H  . DISCONTD: metoprolol tartrate  25 mg Oral BID   Continuous:   . sodium chloride 75 mL/hr at 06/17/12 0500    Assessment/Plan: 1) ?  Esophageal stricture. 2) Dysphagia.   I reviewed the esophagram and the 13 mm tablet lodged in the mid esophagus.  It is uncertain if there is a mucosal defect versus motility issues.  She will require an EGD with dilation, but she is currently on Plavix and ASA.    Plan: 1) Hold Plavix and plan on EGD sometime this weekend. 2) Okay to continue with ASA.  LOS: 4 days   Ayako Tapanes D 06/17/2012, 3:50 PM

## 2012-06-17 NOTE — Progress Notes (Signed)
Speech Language Pathology Dysphagia Treatment Patient Details Name: [name] MRN: [id number] DOB: Feb 05, 1922 Today's Date: 06/17/2012 Time: 2956-2130 SLP Time Calculation (min): 29 min  Assessment / Plan / Recommendation Clinical Impression  Pt with poor intake today, complained of dislike of powdery eggs.  Noted results of esophagram with pt complaint of occasional sensation of food lodging requiring her to wait for it to abate.  She further states better tolerance of warm-room temperature liquids than solids.  These symptoms appear consistent with her primary esophageal dysfunction.     Pt able to verbalize strict precautions including following solids with liquids, reflux precautions as she had participated in a month of slp tx at SNF.  Oral ulcer (anterior) noted which is impairing intake per pt.  Pt does state oral cavity pain has decr with nystatin.  Pt overtly requested not to have pureed foods.  Pt is inquiring if endoscopy will be conducted- as she feels she needs her esophagus dilated.     No further SLP as all education complete and pt has been managing her dysphagia independently.     Diet Recommendation  Continue with Current Diet: Dysphagia 3 (mechanical soft);Thin liquid    SLP Plan All goals met   Pertinent Vitals/Pain Afebrile, decreased   Swallowing Goals  SLP Swallowing Goals Swallow Study Goal #1 - Progress: Met Swallow Study Goal #2 - Progress: Met  General Temperature Spikes Noted: No Respiratory Status: Supplemental O2 delivered via (comment) Behavior/Cognition: Alert;Cooperative;Pleasant mood Oral Cavity - Dentition: Adequate natural dentition (partials) Patient Positioning: Upright in bed  Oral Cavity - Oral Hygiene   anterior ulcer noted,  C/o xerostomia (new this admit)- provided written tips to compensate  Dysphagia Treatment Treatment focused on: Skilled observation of diet tolerance;Patient/family/caregiver education Treatment Methods/Modalities:  Skilled observation Patient observed directly with PO's: Yes Type of PO's observed: Thin liquids Feeding: Able to feed self Liquids provided via: Straw Pharyngeal Phase Signs & Symptoms:  (belching) Type of cueing: Verbal Amount of cueing: Minimal   Donavan Burnet, MS [facility] SLP [phone]

## 2012-06-17 NOTE — Progress Notes (Signed)
Subjective: No new issues.  Objective: Vital signs in last 24 hours: Temp:  [97.4 F (36.3 C)-99.2 F (37.3 C)] 98.2 F (36.8 C) (06/25 0611) Pulse Rate:  [84-91] 89  (06/25 0611) Resp:  [18] 18  (06/25 0611) BP: (146-176)/(61-70) 152/61 mmHg (06/25 0611) SpO2:  [92 %-96 %] 96 % (06/25 1053) Weight change:  Last BM Date: 06/16/12  Intake/Output from previous day: 06/24 0701 - 06/25 0700 In: 750 [I.V.:750] Out: 450 [Urine:450] Total I/O In: 120 [P.O.:120] Out: 200 [Urine:200]   Physical Exam: General: Comfortable, alert, communicative, not short of breath at rest, has occasional cough. HEENT:  Mild clinical pallor, no jaundice, no conjunctival injection or discharge. Hydration is fair.  NECK:  Supple, JVP not seen, no carotid bruits, no palpable lymphadenopathy, no palpable goiter. CHEST:  Clinically clear to auscultation, no wheezes, no crackles. HEART:  Sounds 1 and 2 heard, normal, regular, no murmurs. ABDOMEN:  Flat, soft, non-tender, no palpable organomegaly, no palpable masses, normal bowel sounds. GENITALIA:  Not examined. LOWER EXTREMITIES:  No pitting edema, palpable peripheral pulses. MUSCULOSKELETAL SYSTEM:  Generalized osteoarthritic changes, otherwise, normal. CENTRAL NERVOUS SYSTEM:  No focal neurologic deficit on gross examination.  Lab Results:  Basename 06/17/12 0425 06/16/12 0436  WBC 11.6* 15.7*  HGB 10.2* 11.3*  HCT 30.9* 34.1*  PLT 162 137*    Basename 06/17/12 0425 06/16/12 0436  NA 136 133*  K 3.7 3.9  CL 107 102  CO2 19 19  GLUCOSE 98 88  BUN 43* 57*  CREATININE 1.93* 2.52*  CALCIUM 8.0* 8.4   Recent Results (from the past 240 hour(s))  MRSA PCR SCREENING     Status: Normal   Collection Time   06/14/12  2:14 AM      Component Value Range Status Comment   MRSA by PCR NEGATIVE  NEGATIVE Final   URINE CULTURE     Status: Normal   Collection Time   06/15/12  1:45 PM      Component Value Range Status Comment   Specimen Description  URINE, CLEAN CATCH   Final    Special Requests NONE   Final    Culture  Setup Time [id number]   Final    Colony Count NO GROWTH   Final    Culture NO GROWTH   Final    Report Status 06/17/2012 FINAL   Final      Studies/Results: Dg Esophagus  06/16/2012  *RADIOLOGY REPORT*  Clinical Data: Esophageal stricture, cough, dysphasia  ESOPHOGRAM/BARIUM SWALLOW  Technique:  Single contrast examination was performed using thin barium.  Fluoroscopy time:  1.2 minutes.  Comparison:  None.  Findings:  Marked esophageal dysmotility with numerous tertiary/nonperistaltic contractions and/or diffuse esophageal spasm.  Although suboptimally evaluated, the left lateral aspect of the mid/distal esophagus appears irregular (series 8/image 1), underlying mucosal lesion not excluded.  No definite fixed esophageal narrowing or stricture.  However, the 13 mm barium tablet failed to pass the mid esophagus.  IMPRESSION: Marked esophageal dysmotility with possible diffuse esophageal spasm.  Mucosal irregularity involving the left lateral aspect of the mid/distal esophagus. Endoscopic correlation is suggested to exclude underlying mucosal lesion.  No definite fixed esophageal narrowing or stricture.  However, the 13 mm barium tablet failed to pass the mid esophagus.  Original Report Authenticated By: Charline Bills, M.D.    Medications: Scheduled Meds:    . antiseptic oral rinse  15 mL Mouth Rinse BID  . aspirin  81 mg Oral Daily  . clopidogrel  75 mg  Oral Daily  . dipyridamole  100 mg Oral BID  . donepezil  5 mg Oral QHS  . feeding supplement  237 mL Oral BID BM  . Fluticasone-Salmeterol  1 puff Inhalation Q12H  . levofloxacin (LEVAQUIN) IV  250 mg Intravenous Q24H  . metoprolol tartrate  25 mg Oral BID  . mirtazapine  7.5 mg Oral QHS  . nystatin  5 mL Oral QID  . pantoprazole  40 mg Oral Q0600  . rOPINIRole  0.5 mg Oral q1800  . simvastatin  10 mg Oral q1800  . sodium chloride  2 spray Nasal q morning - 10a    Continuous Infusions:    . sodium chloride 75 mL/hr at 06/17/12 0500   PRN Meds:.acetaminophen, acetaminophen, albuterol, ALPRAZolam, alum & mag hydroxide-simeth, guaiFENesin, HYDROmorphone (DILAUDID) injection, ondansetron (ZOFRAN) IV, ondansetron, oxyCODONE, zolpidem  Assessment/Plan:  Active Problems: 1. UTI (urinary tract infection): Patient was found to have a UTI in the facility and was placed on Bactrim 2 days ago, and presented with fever and chills. Urinalysis showed positive sediment, with pyuria and significant bacteriuria. She was initially commenced on iv Ciprofloxacin, but was transitioned to Levaquin on 06/15/12, to add some respiratory tract coverage (now day#4 antibiotics). She remains apyrexial and urine culture is negative so far. We shall transition to oral Levaquin, for a total of 7 days antibiotic therapy. 2. Dehydration/ARF (acute renal failure): Patient presented with BUN 81 and creatinine 3.93, consistent with dehydration and ARF, against a background of UTI, poor oral intake and diuretics. Unfortunately, baseline creatinine is unknown at this time. We are managing with iv fluids, diuretics have been discontinued, and creatinine is improving gradually. Now 1.93 today. Following renal indices.  3. Hyponatremia: Patient had a hyponatremia of 128 at presentation, secondary to diuretics and volume depletion. Managing as described above, and sodium is 136 today, i.e, resolved. 4. Hypokalemia: Repleted as indicated. Now resolved. 5. Hypertension: BP is reasonably controlled, at this time, on beta-blocker. Will not escalate further, as per daughter, patient sometimes becomes hypotensive, with more aggressive BP control.  6. Peripheral arterial disease: Not problematic.  7. Anxiety/Dementia: Stable on pre-admission psychotropics.  8. Dyslipidemia: Continue Statin. 9. Esophageal stricture: Patient gets periodic esophageal dilatations, by primary GI, Dr Katrinka Blazing in [place].  According to her, this is due soon. Patient continues to cough frequently. Dr Charna Elizabeth provided GI consultation, and barium esophagogram done 06/16/12, showed marked esophageal dysmotility with possible diffuse esophageal spasm, mucosal irregularity involving the left lateral aspect of the mid/distal esophagus, no definite fixed esophageal narrowing or stricture. However, the 13 mm barium tablet failed to pass the mid esophagus. Awaiting GI recommendatins. 10. Oral thrush: This was an incidental finding. Patient was commenced on Nystatin suspension on 06/14/12, now day# 4.        LOS: 4 days   Khyra Viscuso,CHRISTOPHER 06/17/2012, 1:39 PM

## 2012-06-18 DIAGNOSIS — E782 Mixed hyperlipidemia: Secondary | ICD-10-CM

## 2012-06-18 DIAGNOSIS — N179 Acute kidney failure, unspecified: Secondary | ICD-10-CM

## 2012-06-18 DIAGNOSIS — E860 Dehydration: Secondary | ICD-10-CM

## 2012-06-18 DIAGNOSIS — K222 Esophageal obstruction: Secondary | ICD-10-CM

## 2012-06-18 LAB — BASIC METABOLIC PANEL
BUN: 30 mg/dL — ABNORMAL HIGH (ref 6–23)
Chloride: 109 mEq/L (ref 96–112)
GFR calc Af Amer: 35 mL/min — ABNORMAL LOW (ref >90)
Potassium: 3.9 mEq/L (ref 3.5–5.1)

## 2012-06-18 MED ORDER — BENZOCAINE 20 % MT PSTE
1.0000 "application " | PASTE | Freq: Four times a day (QID) | OROMUCOSAL | Status: DC | PRN
  Administered 2012-06-18: 1 via OROMUCOSAL
  Filled 2012-06-18: qty 11.9

## 2012-06-18 NOTE — Progress Notes (Signed)
[facility] PROGRESS NOTE  [name] UJW:[id number] DOB: 03/04/1922 DOA: 06/13/2012 PCP: No primary provider on file.   Assessment/Plan: 1. UTI (urinary tract infection): Patient was found to have a UTI in the facility and was placed on Bactrim 2 days ago, and presented with fever and chills. Urinalysis showed positive sediment, with pyuria and significant bacteriuria. She was initially commenced on iv Ciprofloxacin, but was transitioned to Levaquin on 06/15/12, to add some respiratory tract coverage (now day#5 antibiotics). She remains apyrexial and urine culture is negative so far. We shall transition to oral Levaquin, for a total of 7 days antibiotic therapy.   2. Dehydration/ARF (acute renal failure): Patient presented with BUN 81 and creatinine 3.93, consistent with dehydration and ARF, against a background of UTI, poor oral intake and diuretics. Unfortunately, baseline creatinine is unknown at this time. We are managing with iv fluids, diuretics have been discontinued, and creatinine is improving gradually.  Following renal markers  3. Hyponatremia: Patient had a hyponatremia of 128 at presentation, secondary to diuretics and volume depletion. Managing as described above,  resolved.   4. Hypokalemia: Repleted as indicated. Now resolved.   5. Hypertension: BP is reasonably controlled, at this time, on beta-blocker. Will not escalate further, as per daughter, patient sometimes becomes hypotensive, with more aggressive BP control.   6. Peripheral arterial disease: Not problematic.   7. Anxiety/Dementia: Stable on pre-admission psychotropics.   8. Dyslipidemia: Continue Statin.   9. Esophageal stricture: Patient gets periodic esophageal dilatations, by primary GI, Dr Katrinka Blazing in [place]. According to her, this is due soon. Patient continues to cough frequently. Dr Charna Elizabeth provided GI consultation, and barium esophagogram done 06/16/12, showed marked esophageal dysmotility  with possible diffuse esophageal spasm, mucosal irregularity involving the left lateral aspect of the mid/distal esophagus, no definite fixed esophageal narrowing or stricture. However, the 13 mm barium tablet failed to pass the mid esophagus. Holding plavix and GI plans an EGD this weekend with dilatiation.   10. Oral thrush: This was an incidental finding. Patient was commenced on Nystatin suspension on 06/14/12, now day# 4.   11. PT- if does ok, then walk in hallways  Code Status: DNR Family Communication:  Called and updated daughter Disposition Plan: EGD this weekend- then back to assisted living   Marlin Canary, D.O.  [facility] Pager [phone]   06/18/2012, 8:12 AM  LOS: 5 days    Consultants:  GI  Antibiotics:  levaquin   Subjective: Wants to go home C/o soreness in mouth Some sob No fever No chills Tired of sitting in bed/chair   Objective: Filed Vitals:   06/17/12 2145 06/18/12 0220 06/18/12 0523 06/18/12 0555  BP:  147/71 177/68   Pulse:  82 87   Temp:  99.1 F (37.3 C) 98 F (36.7 C)   TempSrc:  Oral Oral   Resp: 20  40 26  Height:      Weight:      SpO2:  96% 96%     Intake/Output Summary (Last 24 hours) at 06/18/12 6578 Last data filed at 06/18/12 0700  Gross per 24 hour  Intake   1360 ml  Output    900 ml  Net    460 ml    Exam:  General: Comfortable, alert, communicative HEENT: Mild clinical pallor, no jaundice, no conjunctival injection or discharge.  NECK: Supple, JVP not seen, no carotid bruits, no palpable lymphadenopathy, no palpable goiter.  CHEST: Clinically clear to auscultation, no wheezes, no crackles.  HEART: Sounds 1 and 2 heard, normal, regular, no murmurs.  ABDOMEN: Flat, soft, non-tender, no palpable organomegaly, no palpable masses, normal bowel sounds.  LOWER EXTREMITIES: No pitting edema, palpable peripheral pulses.  MUSCULOSKELETAL SYSTEM: Generalized osteoarthritic changes, otherwise, normal.    CENTRAL NERVOUS SYSTEM: No focal neurologic deficit on gross examination.   Data Reviewed: Basic Metabolic Panel:  Lab 06/18/12 1610 06/17/12 0425 06/16/12 0436 06/15/12 0528 06/14/12 0537  NA 138 136 133* 132* 130*  K 3.9 3.7 -- -- --  CL 109 107 102 100 96  CO2 20 19 19  18* 19  GLUCOSE 104* 98 88 89 76  BUN 30* 43* 57* 71* 77*  CREATININE 1.49* 1.93* 2.52* 3.25* 3.68*  CALCIUM 7.9* 8.0* 8.4 8.4 8.0*  MG -- -- -- -- --  PHOS -- -- -- -- --   Liver Function Tests:  Lab 06/16/12 0436 06/13/12 1837  AST 27 36  ALT 16 20  ALKPHOS 92 120*  BILITOT 0.4 0.3  PROT 5.8* 6.9  ALBUMIN 2.2* 2.9*    Lab 06/13/12 1837  LIPASE 52  AMYLASE --   No results found for this basename: AMMONIA:5 in the last 168 hours CBC:  Lab 06/17/12 0425 06/16/12 0436 06/15/12 0528 06/14/12 0537 06/13/12 1837  WBC 11.6* 15.7* 16.7* 12.6* 17.6*  NEUTROABS -- -- -- -- 15.3*  HGB 10.2* 11.3* 11.5* 11.6* 12.4  HCT 30.9* 34.1* 33.8* 34.5* 35.8*  MCV 87.5 87.9 87.1 85.6 85.9  PLT 162 137* 124* 107* 124*   Cardiac Enzymes: No results found for this basename: CKTOTAL:5,CKMB:5,CKMBINDEX:5,TROPONINI:5 in the last 168 hours BNP: No components found with this basename: POCBNP:5 CBG: No results found for this basename: GLUCAP:5 in the last 168 hours  Recent Results (from the past 240 hour(s))  MRSA PCR SCREENING     Status: Normal   Collection Time   06/14/12  2:14 AM      Component Value Range Status Comment   MRSA by PCR NEGATIVE  NEGATIVE Final   URINE CULTURE     Status: Normal   Collection Time   06/15/12  1:45 PM      Component Value Range Status Comment   Specimen Description URINE, CLEAN CATCH   Final    Special Requests NONE   Final    Culture  Setup Time [id number]   Final    Colony Count NO GROWTH   Final    Culture NO GROWTH   Final    Report Status 06/17/2012 FINAL   Final      Studies: Ct Abdomen Pelvis Wo Contrast  06/13/2012  *RADIOLOGY REPORT*  Clinical Data: 2-day history  of left flank pain.  Hematuria. Recent diagnosis of urinary tract infection.  CT ABDOMEN AND PELVIS WITHOUT CONTRAST  Technique:  Multidetector CT imaging of the abdomen and pelvis was performed following the standard protocol without intravenous contrast.  Comparison: None.  Findings: No evidence of urinary tract calculi or obstruction on either side.  Cortical calcification cortical calcification involving the anterior aspect of the lower pole left kidney. Approximate 3.3 x 3.0 cm simple cyst arising from the upper pole of the left kidney.  Approximate 4.2 x 2.6 cm cyst arising from the lateral aspect of the lower pole and approximate 1.3 cm diameter cyst arising from the medial aspect of the lower pole left kidney. Approximate 1.6 cm diameter simple cyst arising from the lower pole right kidney.  Within the limits of the unenhanced technique, no solid masses involving either kidney.  Right kidney mildly atrophic, with compensatory enlargement of the left kidney.  Normal unenhanced appearance of the liver, spleen, pancreas, and right adrenal gland.  Approximate 1.6 x 1.2 cm low attenuation nodule involving the left adrenal gland.  Gallbladder unremarkable by CT.  No biliary ductal dilation.  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  No significant lymphadenopathy.  Small hiatal hernia.  Stomach decompressed otherwise unremarkable. Normal-appearing small bowel.  Extensive sigmoid colon diverticulosis without evidence of acute diverticulitis.  Remainder the colon unremarkable.  No ascites.  Urinary bladder decompressed and unremarkable.  Uterus atrophic consistent with age.  No adnexal masses or free pelvic fluid.  Bone window images demonstrate degenerative changes involving the lower thoracic and lumbar spine and both hips.  Visualized lung bases emphysematous but clear.  Heart size upper normal.  IMPRESSION:  1.  No evidence of urinary tract calculi or obstruction on either side. 2.  Multiple  bilateral renal cysts.  Mildly atrophic right kidney. 3.  Small left adrenal nodule, statistically consistent with an adenoma. 4.  Sigmoid colon diverticulosis without evidence of acute diverticulitis. 5.  Small hiatal hernia.  Original Report Authenticated By: Arnell Sieving, M.D.   Dg Chest 2 View  06/13/2012  *RADIOLOGY REPORT*  Clinical Data: Cough and fever.  CHEST - 2 VIEW  Comparison: 09/18/2011  Findings: Cardiomegaly and hyperinflation again noted. Mild peribronchial thickening is unchanged. There is no evidence of focal airspace disease, pulmonary edema, suspicious pulmonary nodule/mass, pleural effusion, or pneumothorax. No acute bony abnormalities are identified.  IMPRESSION: No evidence of acute cardiopulmonary disease.  Cardiomegaly with mild chronic peribronchial thickening and hyperinflation.  Original Report Authenticated By: Rosendo Gros, M.D.   Dg Esophagus  06/16/2012  *RADIOLOGY REPORT*  Clinical Data: Esophageal stricture, cough, dysphasia  ESOPHOGRAM/BARIUM SWALLOW  Technique:  Single contrast examination was performed using thin barium.  Fluoroscopy time:  1.2 minutes.  Comparison:  None.  Findings:  Marked esophageal dysmotility with numerous tertiary/nonperistaltic contractions and/or diffuse esophageal spasm.  Although suboptimally evaluated, the left lateral aspect of the mid/distal esophagus appears irregular (series 8/image 1), underlying mucosal lesion not excluded.  No definite fixed esophageal narrowing or stricture.  However, the 13 mm barium tablet failed to pass the mid esophagus.  IMPRESSION: Marked esophageal dysmotility with possible diffuse esophageal spasm.  Mucosal irregularity involving the left lateral aspect of the mid/distal esophagus. Endoscopic correlation is suggested to exclude underlying mucosal lesion.  No definite fixed esophageal narrowing or stricture.  However, the 13 mm barium tablet failed to pass the mid esophagus.  Original Report Authenticated  By: Charline Bills, M.D.    Scheduled Meds:   . antiseptic oral rinse  15 mL Mouth Rinse BID  . aspirin  81 mg Oral Daily  . dipyridamole  100 mg Oral BID  . donepezil  5 mg Oral QHS  . feeding supplement  237 mL Oral BID BM  . Fluticasone-Salmeterol  1 puff Inhalation Q12H  . levofloxacin  250 mg Oral Daily  . mirtazapine  7.5 mg Oral QHS  . nystatin  5 mL Oral QID  . pantoprazole  40 mg Oral Q0600  . rOPINIRole  0.5 mg Oral q1800  . simvastatin  10 mg Oral q1800  . sodium chloride  2 spray Nasal q morning - 10a  . DISCONTD: clopidogrel  75 mg Oral Daily  . DISCONTD: levofloxacin (LEVAQUIN) IV  250 mg Intravenous Q24H  . DISCONTD: metoprolol tartrate  25 mg Oral BID   Continuous Infusions:   .  sodium chloride 75 mL/hr at 06/17/12 2300

## 2012-06-18 NOTE — Progress Notes (Signed)
Subjective: Since I last evaluated the patient, she is doing fairly well. Awaiting an EGD with possible dilatation this weekend, when she is off her anticoagulants. She denies any new complaints.   Objective: Vital signs in last 24 hours: Temp:  [97.9 F (36.6 C)-99.1 F (37.3 C)] 97.9 F (36.6 C) (06/26 1419) Pulse Rate:  [82-91] 91  (06/26 1419) Resp:  [15-40] 26  (06/26 1419) BP: (115-177)/(63-71) 145/70 mmHg (06/26 1419) SpO2:  [91 %-96 %] 93 % (06/26 1419) Last BM Date: 06/18/12  Intake/Output from previous day: 06/25 0701 - 06/26 0700 In: 1360 [P.O.:460; I.V.:900] Out: 1100 [Urine:1100] Intake/Output this shift: Total I/O In: 480 [P.O.:480] Out: 400 [Urine:400]  General appearance: alert, cooperative, appears stated age and no distress Resp: clear to auscultation bilaterally Cardio: regular rate and rhythm, S1, S2 normal, no murmur, click, rub or gallop GI: soft, non-tender; bowel sounds normal; no masses,  no organomegaly Extremities: extremities normal, atraumatic, no cyanosis or edema  Lab Results:  Basename 06/17/12 0425 06/16/12 0436  WBC 11.6* 15.7*  HGB 10.2* 11.3*  HCT 30.9* 34.1*  PLT 162 137*   BMET  Basename 06/18/12 0428 06/17/12 0425 06/16/12 0436  NA 138 136 133*  K 3.9 3.7 3.9  CL 109 107 102  CO2 20 19 19   GLUCOSE 104* 98 88  BUN 30* 43* 57*  CREATININE 1.49* 1.93* 2.52*  CALCIUM 7.9* 8.0* 8.4   LFT  Basename 06/16/12 0436  PROT 5.8*  ALBUMIN 2.2*  AST 27  ALT 16  ALKPHOS 92  BILITOT 0.4  BILIDIR --  IBILI --    Medications: I have reviewed the patient's current medications.  Assessment/Plan: 1) Dysphagia with diffuse esophageal spasm and marked esophageal dysmotility and a ?mucosal defect in the mid-esophagus-will plan an EGD this weekend. Patient is aware of these plans.  LOS: 5 days   Onika Gudiel 06/18/2012, 5:59 PM

## 2012-06-19 DIAGNOSIS — K222 Esophageal obstruction: Secondary | ICD-10-CM

## 2012-06-19 DIAGNOSIS — E860 Dehydration: Secondary | ICD-10-CM

## 2012-06-19 DIAGNOSIS — E782 Mixed hyperlipidemia: Secondary | ICD-10-CM

## 2012-06-19 DIAGNOSIS — N179 Acute kidney failure, unspecified: Secondary | ICD-10-CM

## 2012-06-19 MED ORDER — ALBUTEROL SULFATE (5 MG/ML) 0.5% IN NEBU
INHALATION_SOLUTION | RESPIRATORY_TRACT | Status: AC
  Filled 2012-06-19: qty 0.5

## 2012-06-19 NOTE — Progress Notes (Signed)
[facility] PROGRESS NOTE  Rmani Kapusta GNF:[id number] DOB: 1922/11/15 DOA: 06/13/2012 PCP: No primary provider on file.   Assessment/Plan: 1. UTI (urinary tract infection): Patient was found to have a UTI in the facility and was placed on Bactrim 2 days ago, and presented with fever and chills. Urinalysis showed positive sediment, with pyuria and significant bacteriuria. She was initially commenced on iv Ciprofloxacin, but was transitioned to Levaquin on 06/15/12, to add some respiratory tract coverage (now day#6 antibiotics). She remains apyrexial and urine culture is negative so far. We shall transition to oral Levaquin, for a total of 7 days antibiotic therapy.   2. Dehydration/ARF (acute renal failure): Patient presented with BUN 81 and creatinine 3.93, consistent with dehydration and ARF, against a background of UTI, poor oral intake and diuretics. Unfortunately, baseline creatinine is unknown at this time. We are managing with iv fluids, diuretics have been discontinued, and creatinine is improving gradually.  Following renal markers  3. Hyponatremia: Patient had a hyponatremia of 128 at presentation, secondary to diuretics and volume depletion. Managing as described above,  resolved.   4. Hypokalemia: Repleted as indicated. Now resolved.   5. Hypertension: BP is reasonably controlled, at this time, on beta-blocker. Will not escalate further, as per daughter, patient sometimes becomes hypotensive, with more aggressive BP control.   6. Peripheral arterial disease: Not problematic.   7. Anxiety/Dementia: Stable on pre-admission psychotropics.   8. Dyslipidemia: Continue Statin.   9. Esophageal stricture: Patient gets periodic esophageal dilatations, by primary GI, Dr Katrinka Blazing in [place]. According to her, this is due soon. Patient continues to cough frequently. Dr Charna Elizabeth provided GI consultation, and barium esophagogram done 06/16/12, showed marked esophageal dysmotility  with possible diffuse esophageal spasm, mucosal irregularity involving the left lateral aspect of the mid/distal esophagus, no definite fixed esophageal narrowing or stricture. However, the 13 mm barium tablet failed to pass the mid esophagus. Holding plavix and GI plans an EGD this weekend with dilatiation.   10. Oral thrush: This was an incidental finding. Patient was commenced on Nystatin suspension on 06/14/12, now day# 5   11. PT- if does ok, then walk in hallways  Code Status: DNR Family Communication:   Disposition Plan: EGD this weekend- then back to assisted living   Marlin Canary, D.O.  [facility] Pager [phone]   06/19/2012, 10:33 AM  LOS: 6 days    Consultants:  GI  Antibiotics:  levaquin   Subjective: C/o not being able to get comfortable in bed Mouth is sore No fever No chills   Objective: Filed Vitals:   06/18/12 2056 06/18/12 2157 06/19/12 0655 06/19/12 0918  BP:  149/62 137/56   Pulse:  91 84   Temp:  98.6 F (37 C) 98.7 F (37.1 C)   TempSrc:  Oral Oral   Resp:  30 24   Height:      Weight:      SpO2: 95% 97% 97% 97%    Intake/Output Summary (Last 24 hours) at 06/19/12 1033 Last data filed at 06/19/12 0900  Gross per 24 hour  Intake    500 ml  Output    951 ml  Net   -451 ml    Exam:  General: Comfortable, alert, communicative HEENT: Mild clinical pallor, no jaundice, no conjunctival injection or discharge.  NECK: Supple, JVP not seen, no carotid bruits, no palpable lymphadenopathy, no palpable goiter.  CHEST: Clinically clear to auscultation, no wheezes, no crackles.  HEART: Sounds 1 and 2 heard,  normal, regular, no murmurs.  ABDOMEN: Flat, soft, non-tender, no palpable organomegaly, no palpable masses, normal bowel sounds.  LOWER EXTREMITIES: No pitting edema, palpable peripheral pulses.  MUSCULOSKELETAL SYSTEM: Generalized osteoarthritic changes, otherwise, normal.  CENTRAL NERVOUS SYSTEM: No focal neurologic  deficit on gross examination.   Data Reviewed: Basic Metabolic Panel:  Lab 06/18/12 5621 06/17/12 0425 06/16/12 0436 06/15/12 0528 06/14/12 0537  NA 138 136 133* 132* 130*  K 3.9 3.7 -- -- --  CL 109 107 102 100 96  CO2 20 19 19  18* 19  GLUCOSE 104* 98 88 89 76  BUN 30* 43* 57* 71* 77*  CREATININE 1.49* 1.93* 2.52* 3.25* 3.68*  CALCIUM 7.9* 8.0* 8.4 8.4 8.0*  MG -- -- -- -- --  PHOS -- -- -- -- --   Liver Function Tests:  Lab 06/16/12 0436 06/13/12 1837  AST 27 36  ALT 16 20  ALKPHOS 92 120*  BILITOT 0.4 0.3  PROT 5.8* 6.9  ALBUMIN 2.2* 2.9*    Lab 06/13/12 1837  LIPASE 52  AMYLASE --   No results found for this basename: AMMONIA:5 in the last 168 hours CBC:  Lab 06/17/12 0425 06/16/12 0436 06/15/12 0528 06/14/12 0537 06/13/12 1837  WBC 11.6* 15.7* 16.7* 12.6* 17.6*  NEUTROABS -- -- -- -- 15.3*  HGB 10.2* 11.3* 11.5* 11.6* 12.4  HCT 30.9* 34.1* 33.8* 34.5* 35.8*  MCV 87.5 87.9 87.1 85.6 85.9  PLT 162 137* 124* 107* 124*   Cardiac Enzymes: No results found for this basename: CKTOTAL:5,CKMB:5,CKMBINDEX:5,TROPONINI:5 in the last 168 hours BNP: No components found with this basename: POCBNP:5 CBG: No results found for this basename: GLUCAP:5 in the last 168 hours  Recent Results (from the past 240 hour(s))  MRSA PCR SCREENING     Status: Normal   Collection Time   06/14/12  2:14 AM      Component Value Range Status Comment   MRSA by PCR NEGATIVE  NEGATIVE Final   URINE CULTURE     Status: Normal   Collection Time   06/15/12  1:45 PM      Component Value Range Status Comment   Specimen Description URINE, CLEAN CATCH   Final    Special Requests NONE   Final    Culture  Setup Time [id number]   Final    Colony Count NO GROWTH   Final    Culture NO GROWTH   Final    Report Status 06/17/2012 FINAL   Final      Studies: Ct Abdomen Pelvis Wo Contrast  06/13/2012  *RADIOLOGY REPORT*  Clinical Data: 2-day history of left flank pain.  Hematuria. Recent  diagnosis of urinary tract infection.  CT ABDOMEN AND PELVIS WITHOUT CONTRAST  Technique:  Multidetector CT imaging of the abdomen and pelvis was performed following the standard protocol without intravenous contrast.  Comparison: None.  Findings: No evidence of urinary tract calculi or obstruction on either side.  Cortical calcification cortical calcification involving the anterior aspect of the lower pole left kidney. Approximate 3.3 x 3.0 cm simple cyst arising from the upper pole of the left kidney.  Approximate 4.2 x 2.6 cm cyst arising from the lateral aspect of the lower pole and approximate 1.3 cm diameter cyst arising from the medial aspect of the lower pole left kidney. Approximate 1.6 cm diameter simple cyst arising from the lower pole right kidney.  Within the limits of the unenhanced technique, no solid masses involving either kidney.  Right kidney mildly atrophic, with compensatory  enlargement of the left kidney.  Normal unenhanced appearance of the liver, spleen, pancreas, and right adrenal gland.  Approximate 1.6 x 1.2 cm low attenuation nodule involving the left adrenal gland.  Gallbladder unremarkable by CT.  No biliary ductal dilation.  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  No significant lymphadenopathy.  Small hiatal hernia.  Stomach decompressed otherwise unremarkable. Normal-appearing small bowel.  Extensive sigmoid colon diverticulosis without evidence of acute diverticulitis.  Remainder the colon unremarkable.  No ascites.  Urinary bladder decompressed and unremarkable.  Uterus atrophic consistent with age.  No adnexal masses or free pelvic fluid.  Bone window images demonstrate degenerative changes involving the lower thoracic and lumbar spine and both hips.  Visualized lung bases emphysematous but clear.  Heart size upper normal.  IMPRESSION:  1.  No evidence of urinary tract calculi or obstruction on either side. 2.  Multiple bilateral renal cysts.  Mildly  atrophic right kidney. 3.  Small left adrenal nodule, statistically consistent with an adenoma. 4.  Sigmoid colon diverticulosis without evidence of acute diverticulitis. 5.  Small hiatal hernia.  Original Report Authenticated By: Arnell Sieving, M.D.   Dg Chest 2 View  06/13/2012  *RADIOLOGY REPORT*  Clinical Data: Cough and fever.  CHEST - 2 VIEW  Comparison: 09/18/2011  Findings: Cardiomegaly and hyperinflation again noted. Mild peribronchial thickening is unchanged. There is no evidence of focal airspace disease, pulmonary edema, suspicious pulmonary nodule/mass, pleural effusion, or pneumothorax. No acute bony abnormalities are identified.  IMPRESSION: No evidence of acute cardiopulmonary disease.  Cardiomegaly with mild chronic peribronchial thickening and hyperinflation.  Original Report Authenticated By: Rosendo Gros, M.D.   Dg Esophagus  06/16/2012  *RADIOLOGY REPORT*  Clinical Data: Esophageal stricture, cough, dysphasia  ESOPHOGRAM/BARIUM SWALLOW  Technique:  Single contrast examination was performed using thin barium.  Fluoroscopy time:  1.2 minutes.  Comparison:  None.  Findings:  Marked esophageal dysmotility with numerous tertiary/nonperistaltic contractions and/or diffuse esophageal spasm.  Although suboptimally evaluated, the left lateral aspect of the mid/distal esophagus appears irregular (series 8/image 1), underlying mucosal lesion not excluded.  No definite fixed esophageal narrowing or stricture.  However, the 13 mm barium tablet failed to pass the mid esophagus.  IMPRESSION: Marked esophageal dysmotility with possible diffuse esophageal spasm.  Mucosal irregularity involving the left lateral aspect of the mid/distal esophagus. Endoscopic correlation is suggested to exclude underlying mucosal lesion.  No definite fixed esophageal narrowing or stricture.  However, the 13 mm barium tablet failed to pass the mid esophagus.  Original Report Authenticated By: Charline Bills, M.D.     Scheduled Meds:    . antiseptic oral rinse  15 mL Mouth Rinse BID  . aspirin  81 mg Oral Daily  . dipyridamole  100 mg Oral BID  . donepezil  5 mg Oral QHS  . feeding supplement  237 mL Oral BID BM  . Fluticasone-Salmeterol  1 puff Inhalation Q12H  . levofloxacin  250 mg Oral Daily  . mirtazapine  7.5 mg Oral QHS  . nystatin  5 mL Oral QID  . pantoprazole  40 mg Oral Q0600  . rOPINIRole  0.5 mg Oral q1800  . simvastatin  10 mg Oral q1800  . sodium chloride  2 spray Nasal q morning - 10a   Continuous Infusions:

## 2012-06-19 NOTE — Evaluation (Signed)
Physical Therapy Evaluation Patient Details Name: [name] MRN: [id number] DOB: 1922-11-15 Today's Date: 06/19/2012 Time: 4098-1191 PT Time Calculation (min): 24 min  PT Assessment / Plan / Recommendation Clinical Impression  78 yo female admitted with UTI, acute renal failure. Pt was Independent at ALF PTA. On eval pt is now requiring use of RW and +1 assist for mobility. Recommend HHPT at ALF if facility is able to provide current level of care. If facility unable, then pt will likely need SNF for continued rehab    PT Assessment  Patient needs continued PT services    Follow Up Recommendations  Home health PT;Supervision for mobility/OOB  (SNF if facility is unable to provide current level of care)   Barriers to Discharge        Equipment Recommendations  None recommended by PT    Recommendations for Other Services OT consult   Frequency Min 3X/week    Precautions / Restrictions Precautions Precautions: Fall Restrictions Weight Bearing Restrictions: No   Pertinent Vitals/Pain       Mobility  Bed Mobility Bed Mobility: Supine to Sit Supine to Sit: 3: Mod assist;HOB elevated;With rails Details for Bed Mobility Assistance: VCs safety, technique hand placement. Assist for bil LEs off bed and trunk to upright. Utilized bedpad for scooting, positioning Transfers Transfers: Sit to Stand;Stand to Sit Sit to Stand: 4: Min assist;With upper extremity assist;From bed;From toilet Stand to Sit: 4: Min assist;With upper extremity assist;To toilet;To chair/3-in-1 Details for Transfer Assistance: VCs safety, hand placement. assist to rise, stabilize, control descent.  Ambulation/Gait Ambulation/Gait Assistance: 4: Min assist Ambulation Distance (Feet): 100 Feet Assistive device: Rolling walker Ambulation/Gait Assistance Details:  VCs safety, distance from RW, step length. Assist to stabilize throughout ambulation.  Gait Pattern: Step-through pattern;Decreased stride  length;Decreased step length - right;Decreased step length - left    Exercises     PT Diagnosis: Difficulty walking;Generalized weakness  PT Problem List: Decreased strength;Decreased activity tolerance;Decreased mobility;Decreased knowledge of use of DME PT Treatment Interventions: DME instruction;Gait training;Functional mobility training;Therapeutic activities;Therapeutic exercise;Patient/family education   PT Goals Acute Rehab PT Goals PT Goal Formulation: With patient Time For Goal Achievement: 06/26/12 Potential to Achieve Goals: Good Pt will go Supine/Side to Sit: with supervision PT Goal: Supine/Side to Sit - Progress: Goal set today Pt will go Sit to Supine/Side: with supervision PT Goal: Sit to Supine/Side - Progress: Goal set today Pt will go Sit to Stand: with supervision PT Goal: Sit to Stand - Progress: Goal set today Pt will Ambulate: 51 - 150 feet;with supervision;with least restrictive assistive device PT Goal: Ambulate - Progress: Goal set today  Visit Information  Last PT Received On: 06/19/12 Assistance Needed: +1    Subjective Data  Subjective: "I asked for you all" Patient Stated Goal: Get stronger   Prior Functioning  Home Living Lives With: Alone Type of Home: Assisted living [facility]) Home Access: Level entry Home Adaptive Equipment: None Prior Function Level of Independence: Independent Communication Communication: No difficulties    Cognition  Overall Cognitive Status: Appears within functional limits for tasks assessed/performed Arousal/Alertness: Awake/alert Orientation Level: Appears intact for tasks assessed Behavior During Session: [facility] for tasks performed    Extremity/Trunk Assessment Right Lower Extremity Assessment RLE ROM/Strength/Tone: Deficits RLE ROM/Strength/Tone Deficits: Strength at least 4/5  RLE Coordination: WFL - gross motor Left Lower Extremity Assessment LLE ROM/Strength/Tone: Deficits LLE ROM/Strength/Tone  Deficits: Strength at least 4/5 LLE Coordination: WFL - gross motor   Balance    End of Session PT -  End of Session Equipment Utilized During Treatment: Gait belt Activity Tolerance: Patient tolerated treatment well Patient left: in chair;with call bell/phone within reach  GP     Rebeca Alert [facility] 06/19/2012, 12:16 PM [phone]

## 2012-06-19 NOTE — Progress Notes (Signed)
Subjective: No complaints.  Objective: Vital signs in last 24 hours: Temp:  [98 F (36.7 C)-98.7 F (37.1 C)] 98 F (36.7 C) (06/27 1359) Pulse Rate:  [84-100] 90  (06/27 1359) Resp:  [18-30] 18  (06/27 1359) BP: (137-156)/(56-75) 146/72 mmHg (06/27 1359) SpO2:  [95 %-97 %] 95 % (06/27 1359) Last BM Date: 06/18/12  Intake/Output from previous day: 06/26 0701 - 06/27 0700 In: 600 [P.O.:600] Out: 750 [Urine:750] Intake/Output this shift: Total I/O In: 340 [P.O.:340] Out: 502 [Urine:500; Stool:2]  General appearance: alert and icteric GI: soft, non-tender; bowel sounds normal; no masses,  no organomegaly  Lab Results:  Basename 06/17/12 0425  WBC 11.6*  HGB 10.2*  HCT 30.9*  PLT 162   BMET  Basename 06/18/12 0428 06/17/12 0425  NA 138 136  K 3.9 3.7  CL 109 107  CO2 20 19  GLUCOSE 104* 98  BUN 30* 43*  CREATININE 1.49* 1.93*  CALCIUM 7.9* 8.0*   LFT No results found for this basename: PROT,ALBUMIN,AST,ALT,ALKPHOS,BILITOT,BILIDIR,IBILI in the last 72 hours PT/INR No results found for this basename: LABPROT:2,INR:2 in the last 72 hours Hepatitis Panel No results found for this basename: HEPBSAG,HCVAB,HEPAIGM,HEPBIGM in the last 72 hours C-Diff No results found for this basename: CDIFFTOX:3 in the last 72 hours Fecal Lactopherrin No results found for this basename: FECLLACTOFRN in the last 72 hours  Studies/Results: No results found.  Medications:  Scheduled:   . antiseptic oral rinse  15 mL Mouth Rinse BID  . aspirin  81 mg Oral Daily  . dipyridamole  100 mg Oral BID  . donepezil  5 mg Oral QHS  . feeding supplement  237 mL Oral BID BM  . Fluticasone-Salmeterol  1 puff Inhalation Q12H  . levofloxacin  250 mg Oral Daily  . mirtazapine  7.5 mg Oral QHS  . nystatin  5 mL Oral QID  . pantoprazole  40 mg Oral Q0600  . rOPINIRole  0.5 mg Oral q1800  . simvastatin  10 mg Oral q1800  . sodium chloride  2 spray Nasal q morning - 10a   Continuous:    Assessment/Plan: 1) Dysphagia. 2) ? Esophageal stricture.  Plan: 1) Hopefully Saturday for her EGD with dilation.  LOS: 6 days   Marciano Mundt D 06/19/2012, 4:02 PM

## 2012-06-20 DIAGNOSIS — K222 Esophageal obstruction: Secondary | ICD-10-CM

## 2012-06-20 DIAGNOSIS — E860 Dehydration: Secondary | ICD-10-CM

## 2012-06-20 DIAGNOSIS — E782 Mixed hyperlipidemia: Secondary | ICD-10-CM

## 2012-06-20 DIAGNOSIS — N179 Acute kidney failure, unspecified: Secondary | ICD-10-CM

## 2012-06-20 NOTE — Evaluation (Signed)
Occupational Therapy Evaluation Patient Details Name: [name] MRN: [id number] DOB: Dec 26, 1921 Today's Date: 06/20/2012 Time: 0272-5366 OT Time Calculation (min): 24 min  OT Assessment / Plan / Recommendation Clinical Impression  This 78 y.o. female admitted from ALF secondary to UTI.  Pt. demonstrates the below listed deficits.  Currently, pt requires min A for ADLs due to endurance and decreased balance.  If ALF able to provide this level of assistance, she could return there with HHOT; otherwise will need SNF.  REcommend continued OT to maximize safety and independence to allow pt. to return to PLOF.    OT Assessment  Patient needs continued OT Services    Follow Up Recommendations  Home health OT;Skilled nursing facility;Supervision/Assistance - 24 hour    Barriers to Discharge Decreased caregiver support    Equipment Recommendations  None recommended by OT    Recommendations for Other Services    Frequency  Min 2X/week    Precautions / Restrictions Precautions Precautions: Fall Restrictions Weight Bearing Restrictions: No       ADL  Eating/Feeding: Simulated;Independent Where Assessed - Eating/Feeding: Chair Grooming: Performed;Wash/dry hands;Minimal assistance Where Assessed - Grooming: Unsupported standing Upper Body Bathing: Simulated;Set up Where Assessed - Upper Body Bathing: Supported sitting Lower Body Bathing: Simulated;Minimal assistance Where Assessed - Lower Body Bathing: Unsupported sit to stand Upper Body Dressing: Simulated;Set up Where Assessed - Upper Body Dressing: Unsupported sitting Lower Body Dressing: Simulated;Minimal assistance Where Assessed - Lower Body Dressing: Unsupported sit to stand Toilet Transfer: Simulated;Minimal assistance Toilet Transfer Method: Sit to stand Toilet Transfer Equipment: Comfort height toilet Toileting - Clothing Manipulation and Hygiene: Performed;Minimal assistance Where Assessed - Toileting Clothing  Manipulation and Hygiene: Standing Transfers/Ambulation Related to ADLs: min A.  Mild unsteadiness.  Dyspnea 2/4 ADL Comments: Pt. requires min A due to balance deficits and decreased endurance    OT Diagnosis: Generalized weakness  OT Problem List: Decreased strength;Decreased activity tolerance;Impaired balance (sitting and/or standing);Decreased knowledge of use of DME or AE OT Treatment Interventions: Self-care/ADL training;DME and/or AE instruction;Therapeutic activities;Balance training;Patient/family education   OT Goals Acute Rehab OT Goals OT Goal Formulation: With patient Time For Goal Achievement: 06/27/12 Potential to Achieve Goals: Good ADL Goals Pt Will Perform Grooming: with supervision;Standing at sink ADL Goal: Grooming - Progress: Goal set today Pt Will Perform Upper Body Bathing: with supervision;Standing at sink;Sitting at sink (supervision for set up) ADL Goal: Upper Body Bathing - Progress: Goal set today Pt Will Perform Lower Body Bathing: with supervision;Sit to stand from chair;Sit to stand from bed ADL Goal: Lower Body Bathing - Progress: Goal set today Pt Will Perform Upper Body Dressing: with supervision;Sitting, chair;Sitting, bed (with set up) ADL Goal: Upper Body Dressing - Progress: Goal set today Pt Will Perform Lower Body Dressing: with supervision;Sit to stand from chair;Sit to stand from bed ADL Goal: Lower Body Dressing - Progress: Goal set today Pt Will Transfer to Toilet: with supervision;Ambulation;Comfort height toilet ADL Goal: Toilet Transfer - Progress: Goal set today Pt Will Perform Toileting - Clothing Manipulation: with supervision;Standing ADL Goal: Toileting - Clothing Manipulation - Progress: Goal set today  Visit Information  Last OT Received On: 06/20/12 Assistance Needed: +1    Subjective Data  Subjective: "I walked in here, but I don't know if I'll be able to walk out Patient Stated Goal: To regain indpendence   Prior  Functioning  Home Living Type of Home: Assisted living [facility]) Home Access: Level entry Home Adaptive Equipment: Walker - rolling Prior Function Level of Independence:  Independent Able to Take Stairs?: Yes Driving: No Vocation: Retired Musician: No difficulties Dominant Hand: Right    Cognition  Overall Cognitive Status: Appears within functional limits for tasks assessed/performed Arousal/Alertness: Awake/alert Orientation Level: Appears intact for tasks assessed Behavior During Session: [facility] for tasks performed    Extremity/Trunk Assessment Right Upper Extremity Assessment RUE ROM/Strength/Tone: Within functional levels RUE Coordination: WFL - gross/fine motor Left Upper Extremity Assessment LUE ROM/Strength/Tone: Within functional levels LUE Coordination: WFL - gross/fine motor   Mobility Bed Mobility Bed Mobility: Supine to Sit Supine to Sit: 5: Supervision;HOB flat Transfers Transfers: Sit to Stand;Stand to Sit Sit to Stand: 4: Min assist;With upper extremity assist;From bed;From toilet Stand to Sit: 4: Min assist;With upper extremity assist;To toilet;To chair/3-in-1   Exercise    Balance    End of Session    GO     Jeani Hawking M 06/20/2012, 1:37 PM

## 2012-06-20 NOTE — Progress Notes (Signed)
[facility] PROGRESS NOTE  [name] WJX:[id number] DOB: 1922/08/30 DOA: 06/13/2012 PCP: No primary provider on file.   Assessment/Plan: 1. UTI (urinary tract infection): Patient was found to have a UTI in the facility and was placed on Bactrim 2 days ago, and presented with fever and chills. Urinalysis showed positive sediment, with pyuria and significant bacteriuria. She was initially commenced on iv Ciprofloxacin, but was transitioned to Levaquin on 06/15/12, to add some respiratory tract coverage (now day#7 antibiotics). She remains apyrexial and urine culture is negative so far. We shall transition to oral Levaquin, for a total of 7 days antibiotic therapy.   2. Dehydration/ARF (acute renal failure): Patient presented with BUN 81 and creatinine 3.93, consistent with dehydration and ARF, against a background of UTI, poor oral intake and diuretics. Unfortunately, baseline creatinine is unknown at this time. We are managing with iv fluids, diuretics have been discontinued, and creatinine is improving gradually.  Following renal markers  3. Hyponatremia: Patient had a hyponatremia of 128 at presentation, secondary to diuretics and volume depletion. Managing as described above,  resolved.   4. Hypokalemia: Repleted as indicated. Now resolved.   5. Hypertension: BP is reasonably controlled, at this time, on beta-blocker. Will not escalate further, as per daughter, patient sometimes becomes hypotensive, with more aggressive BP control.   6. Peripheral arterial disease: Not problematic.   7. Anxiety/Dementia: Stable on pre-admission psychotropics.   8. Dyslipidemia: Continue Statin.   9. Esophageal stricture: Patient gets periodic esophageal dilatations, by primary GI, Dr Katrinka Blazing in [place]. According to her, this is due soon. Patient continues to cough frequently. Dr Charna Elizabeth provided GI consultation, and barium esophagogram done 06/16/12, showed marked esophageal dysmotility  with possible diffuse esophageal spasm, mucosal irregularity involving the left lateral aspect of the mid/distal esophagus, no definite fixed esophageal narrowing or stricture. However, the 13 mm barium tablet failed to pass the mid esophagus. Holding plavix and GI plans an EGD this weekend with dilatiation.   10. Oral thrush: This was an incidental finding. Patient was commenced on Nystatin suspension on 06/14/12, now day# 6  11. PT- if does ok, then walk in hallways  Code Status: DNR Family Communication:  Daughter updated Disposition Plan: EGD this weekend- then back to assisted living   Marlin Canary, D.O.  [facility] Pager [phone]   06/20/2012, 8:44 AM  LOS: 7 days    Consultants:  GI  Antibiotics:  levaquin   Subjective: Cough better NO CP No SOB   Objective: Filed Vitals:   06/19/12 1359 06/19/12 2019 06/19/12 2301 06/20/12 0609  BP: 146/72  149/68 147/68  Pulse: 90  87 90  Temp: 98 F (36.7 C)  99.3 F (37.4 C) 97.4 F (36.3 C)  TempSrc: Oral  Oral Oral  Resp: 18  23 26   Height:      Weight:      SpO2: 95% 96% 97% 97%    Intake/Output Summary (Last 24 hours) at 06/20/12 0844 Last data filed at 06/20/12 0253  Gross per 24 hour  Intake    680 ml  Output   1203 ml  Net   -523 ml    Exam:  General: Comfortable, alert, communicative HEENT: Mild clinical pallor, no jaundice, no conjunctival injection or discharge.  NECK: Supple, JVP not seen, no carotid bruits, no palpable lymphadenopathy, no palpable goiter.  CHEST: Clinically clear to auscultation, no wheezes, no crackles.  HEART: Sounds 1 and 2 heard, normal, regular, no murmurs.  ABDOMEN: Flat, soft,  non-tender, no palpable organomegaly, no palpable masses, normal bowel sounds.  LOWER EXTREMITIES: No pitting edema, palpable peripheral pulses.  MUSCULOSKELETAL SYSTEM: Generalized osteoarthritic changes, otherwise, normal.  CENTRAL NERVOUS SYSTEM: No focal neurologic deficit on  gross examination.   Data Reviewed: Basic Metabolic Panel:  Lab 06/18/12 1610 06/17/12 0425 06/16/12 0436 06/15/12 0528 06/14/12 0537  NA 138 136 133* 132* 130*  K 3.9 3.7 -- -- --  CL 109 107 102 100 96  CO2 20 19 19  18* 19  GLUCOSE 104* 98 88 89 76  BUN 30* 43* 57* 71* 77*  CREATININE 1.49* 1.93* 2.52* 3.25* 3.68*  CALCIUM 7.9* 8.0* 8.4 8.4 8.0*  MG -- -- -- -- --  PHOS -- -- -- -- --   Liver Function Tests:  Lab 06/16/12 0436 06/13/12 1837  AST 27 36  ALT 16 20  ALKPHOS 92 120*  BILITOT 0.4 0.3  PROT 5.8* 6.9  ALBUMIN 2.2* 2.9*    Lab 06/13/12 1837  LIPASE 52  AMYLASE --   No results found for this basename: AMMONIA:5 in the last 168 hours CBC:  Lab 06/17/12 0425 06/16/12 0436 06/15/12 0528 06/14/12 0537 06/13/12 1837  WBC 11.6* 15.7* 16.7* 12.6* 17.6*  NEUTROABS -- -- -- -- 15.3*  HGB 10.2* 11.3* 11.5* 11.6* 12.4  HCT 30.9* 34.1* 33.8* 34.5* 35.8*  MCV 87.5 87.9 87.1 85.6 85.9  PLT 162 137* 124* 107* 124*   Cardiac Enzymes: No results found for this basename: CKTOTAL:5,CKMB:5,CKMBINDEX:5,TROPONINI:5 in the last 168 hours BNP: No components found with this basename: POCBNP:5 CBG: No results found for this basename: GLUCAP:5 in the last 168 hours  Recent Results (from the past 240 hour(s))  MRSA PCR SCREENING     Status: Normal   Collection Time   06/14/12  2:14 AM      Component Value Range Status Comment   MRSA by PCR NEGATIVE  NEGATIVE Final   URINE CULTURE     Status: Normal   Collection Time   06/15/12  1:45 PM      Component Value Range Status Comment   Specimen Description URINE, CLEAN CATCH   Final    Special Requests NONE   Final    Culture  Setup Time [id number]   Final    Colony Count NO GROWTH   Final    Culture NO GROWTH   Final    Report Status 06/17/2012 FINAL   Final      Studies: Ct Abdomen Pelvis Wo Contrast  06/13/2012  *RADIOLOGY REPORT*  Clinical Data: 2-day history of left flank pain.  Hematuria. Recent diagnosis of  urinary tract infection.  CT ABDOMEN AND PELVIS WITHOUT CONTRAST  Technique:  Multidetector CT imaging of the abdomen and pelvis was performed following the standard protocol without intravenous contrast.  Comparison: None.  Findings: No evidence of urinary tract calculi or obstruction on either side.  Cortical calcification cortical calcification involving the anterior aspect of the lower pole left kidney. Approximate 3.3 x 3.0 cm simple cyst arising from the upper pole of the left kidney.  Approximate 4.2 x 2.6 cm cyst arising from the lateral aspect of the lower pole and approximate 1.3 cm diameter cyst arising from the medial aspect of the lower pole left kidney. Approximate 1.6 cm diameter simple cyst arising from the lower pole right kidney.  Within the limits of the unenhanced technique, no solid masses involving either kidney.  Right kidney mildly atrophic, with compensatory enlargement of the left kidney.  Normal unenhanced  appearance of the liver, spleen, pancreas, and right adrenal gland.  Approximate 1.6 x 1.2 cm low attenuation nodule involving the left adrenal gland.  Gallbladder unremarkable by CT.  No biliary ductal dilation.  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  No significant lymphadenopathy.  Small hiatal hernia.  Stomach decompressed otherwise unremarkable. Normal-appearing small bowel.  Extensive sigmoid colon diverticulosis without evidence of acute diverticulitis.  Remainder the colon unremarkable.  No ascites.  Urinary bladder decompressed and unremarkable.  Uterus atrophic consistent with age.  No adnexal masses or free pelvic fluid.  Bone window images demonstrate degenerative changes involving the lower thoracic and lumbar spine and both hips.  Visualized lung bases emphysematous but clear.  Heart size upper normal.  IMPRESSION:  1.  No evidence of urinary tract calculi or obstruction on either side. 2.  Multiple bilateral renal cysts.  Mildly atrophic right  kidney. 3.  Small left adrenal nodule, statistically consistent with an adenoma. 4.  Sigmoid colon diverticulosis without evidence of acute diverticulitis. 5.  Small hiatal hernia.  Original Report Authenticated By: Arnell Sieving, M.D.   Dg Chest 2 View  06/13/2012  *RADIOLOGY REPORT*  Clinical Data: Cough and fever.  CHEST - 2 VIEW  Comparison: 09/18/2011  Findings: Cardiomegaly and hyperinflation again noted. Mild peribronchial thickening is unchanged. There is no evidence of focal airspace disease, pulmonary edema, suspicious pulmonary nodule/mass, pleural effusion, or pneumothorax. No acute bony abnormalities are identified.  IMPRESSION: No evidence of acute cardiopulmonary disease.  Cardiomegaly with mild chronic peribronchial thickening and hyperinflation.  Original Report Authenticated By: Rosendo Gros, M.D.   Dg Esophagus  06/16/2012  *RADIOLOGY REPORT*  Clinical Data: Esophageal stricture, cough, dysphasia  ESOPHOGRAM/BARIUM SWALLOW  Technique:  Single contrast examination was performed using thin barium.  Fluoroscopy time:  1.2 minutes.  Comparison:  None.  Findings:  Marked esophageal dysmotility with numerous tertiary/nonperistaltic contractions and/or diffuse esophageal spasm.  Although suboptimally evaluated, the left lateral aspect of the mid/distal esophagus appears irregular (series 8/image 1), underlying mucosal lesion not excluded.  No definite fixed esophageal narrowing or stricture.  However, the 13 mm barium tablet failed to pass the mid esophagus.  IMPRESSION: Marked esophageal dysmotility with possible diffuse esophageal spasm.  Mucosal irregularity involving the left lateral aspect of the mid/distal esophagus. Endoscopic correlation is suggested to exclude underlying mucosal lesion.  No definite fixed esophageal narrowing or stricture.  However, the 13 mm barium tablet failed to pass the mid esophagus.  Original Report Authenticated By: Charline Bills, M.D.    Scheduled  Meds:    . antiseptic oral rinse  15 mL Mouth Rinse BID  . aspirin  81 mg Oral Daily  . dipyridamole  100 mg Oral BID  . donepezil  5 mg Oral QHS  . feeding supplement  237 mL Oral BID BM  . Fluticasone-Salmeterol  1 puff Inhalation Q12H  . levofloxacin  250 mg Oral Daily  . mirtazapine  7.5 mg Oral QHS  . nystatin  5 mL Oral QID  . pantoprazole  40 mg Oral Q0600  . rOPINIRole  0.5 mg Oral q1800  . simvastatin  10 mg Oral q1800  . sodium chloride  2 spray Nasal q morning - 10a   Continuous Infusions:

## 2012-06-20 NOTE — Progress Notes (Signed)
Subjective: No acute events.  Objective: Vital signs in last 24 hours: Temp:  [97.4 F (36.3 C)-99.3 F (37.4 C)] 97.4 F (36.3 C) (06/28 0609) Pulse Rate:  [87-90] 90  (06/28 0609) Resp:  [18-26] 26  (06/28 0609) BP: (146-149)/(68-72) 147/68 mmHg (06/28 0609) SpO2:  [95 %-97 %] 97 % (06/28 0609) Last BM Date: 06/20/12  Intake/Output from previous day: 06/27 0701 - 06/28 0700 In: 680 [P.O.:680] Out: 1203 [Urine:1200; Stool:3] Intake/Output this shift: Total I/O In: 120 [P.O.:120] Out: 150 [Urine:150]  General appearance: alert and no distress GI: soft, non-tender; bowel sounds normal; no masses,  no organomegaly  Lab Results: No results found for this basename: WBC:3,HGB:3,HCT:3,PLT:3 in the last 72 hours BMET  [facility] 06/18/12 0428  NA 138  K 3.9  CL 109  CO2 20  GLUCOSE 104*  BUN 30*  CREATININE 1.49*  CALCIUM 7.9*   LFT No results found for this basename: PROT,ALBUMIN,AST,ALT,ALKPHOS,BILITOT,BILIDIR,IBILI in the last 72 hours PT/INR No results found for this basename: LABPROT:2,INR:2 in the last 72 hours Hepatitis Panel No results found for this basename: HEPBSAG,HCVAB,HEPAIGM,HEPBIGM in the last 72 hours C-Diff No results found for this basename: CDIFFTOX:3 in the last 72 hours Fecal Lactopherrin No results found for this basename: FECLLACTOFRN in the last 72 hours  Studies/Results: No results found.  Medications:  Scheduled:   . antiseptic oral rinse  15 mL Mouth Rinse BID  . aspirin  81 mg Oral Daily  . dipyridamole  100 mg Oral BID  . donepezil  5 mg Oral QHS  . feeding supplement  237 mL Oral BID BM  . Fluticasone-Salmeterol  1 puff Inhalation Q12H  . levofloxacin  250 mg Oral Daily  . mirtazapine  7.5 mg Oral QHS  . nystatin  5 mL Oral QID  . pantoprazole  40 mg Oral Q0600  . rOPINIRole  0.5 mg Oral q1800  . simvastatin  10 mg Oral q1800  . sodium chloride  2 spray Nasal q morning - 10a   Continuous:   Assessment/Plan: 1) Esophageal  stricture. 2) Dysphagia.   I will perform the EGD tomorrow with dilation.  She will also be discontinued on her dipyridamole for the procedure.  Plan: 1) EGD tomorrow. 2) Hold Dipyridamole.  LOS: 7 days   Ainara Eldridge D 06/20/2012, 10:12 AM

## 2012-06-20 NOTE — Progress Notes (Signed)
Per MD, Pt not med ready until Mon.  Providence Crosby, LCSWA Clinical Social Work [phone]

## 2012-06-21 ENCOUNTER — Encounter (HOSPITAL_COMMUNITY): Payer: Self-pay | Admitting: *Deleted

## 2012-06-21 ENCOUNTER — Encounter (HOSPITAL_COMMUNITY)
Admission: EM | Disposition: A | Discharge status: Home Health | Payer: Self-pay | Source: Home / Self Care | Attending: Internal Medicine

## 2012-06-21 DIAGNOSIS — E782 Mixed hyperlipidemia: Secondary | ICD-10-CM

## 2012-06-21 DIAGNOSIS — N179 Acute kidney failure, unspecified: Secondary | ICD-10-CM

## 2012-06-21 DIAGNOSIS — E860 Dehydration: Secondary | ICD-10-CM

## 2012-06-21 DIAGNOSIS — K222 Esophageal obstruction: Secondary | ICD-10-CM

## 2012-06-21 HISTORY — PX: ESOPHAGOGASTRODUODENOSCOPY: SHX5428

## 2012-06-21 LAB — BASIC METABOLIC PANEL
BUN: 20 mg/dL (ref 6–23)
CO2: 30 mEq/L (ref 19–32)
Chloride: 102 mEq/L (ref 96–112)
Glucose, Bld: 94 mg/dL (ref 70–99)
Potassium: 4.4 mEq/L (ref 3.5–5.1)

## 2012-06-21 LAB — CBC
HCT: 30 % — ABNORMAL LOW (ref 36.0–46.0)
Hemoglobin: 9.6 g/dL — ABNORMAL LOW (ref 12.0–15.0)
MCHC: 32 g/dL (ref 30.0–36.0)
WBC: 8.4 10*3/uL (ref 4.0–10.5)

## 2012-06-21 SURGERY — EGD (ESOPHAGOGASTRODUODENOSCOPY)
Anesthesia: Moderate Sedation

## 2012-06-21 MED ORDER — MIDAZOLAM HCL 10 MG/2ML IJ SOLN
INTRAMUSCULAR | Status: AC
  Filled 2012-06-21: qty 2

## 2012-06-21 MED ORDER — MAGIC MOUTHWASH W/LIDOCAINE
5.0000 mL | Freq: Three times a day (TID) | ORAL | Status: DC
  Administered 2012-06-21 – 2012-06-23 (×5): 5 mL via ORAL
  Filled 2012-06-21 (×7): qty 5

## 2012-06-21 MED ORDER — MAGIC MOUTHWASH W/LIDOCAINE
5.0000 mL | Freq: Three times a day (TID) | ORAL | Status: DC
  Administered 2012-06-21: 5 mL via ORAL
  Filled 2012-06-21 (×3): qty 5

## 2012-06-21 MED ORDER — FENTANYL CITRATE 0.05 MG/ML IJ SOLN
INTRAMUSCULAR | Status: AC
  Filled 2012-06-21: qty 2

## 2012-06-21 MED ORDER — MIDAZOLAM HCL 10 MG/2ML IJ SOLN
INTRAMUSCULAR | Status: DC | PRN
  Administered 2012-06-21 (×2): 1 mg via INTRAVENOUS

## 2012-06-21 MED ORDER — SODIUM CHLORIDE 0.9 % IV SOLN
Freq: Once | INTRAVENOUS | Status: AC
  Administered 2012-06-21: 250 mL via INTRAVENOUS

## 2012-06-21 MED ORDER — FENTANYL CITRATE 0.05 MG/ML IJ SOLN
INTRAMUSCULAR | Status: DC | PRN
  Administered 2012-06-21 (×2): 12.5 ug via INTRAVENOUS

## 2012-06-21 MED ORDER — NYSTATIN 100000 UNIT/ML MT SUSP
5.0000 mL | Freq: Four times a day (QID) | OROMUCOSAL | Status: DC
  Administered 2012-06-21 (×2): 500000 [IU] via ORAL
  Filled 2012-06-21: qty 60

## 2012-06-21 NOTE — H&P (View-Only) (Signed)
Subjective: No acute events.  Objective: Vital signs in last 24 hours: Temp:  [97.4 F (36.3 C)-99.3 F (37.4 C)] 97.4 F (36.3 C) (06/28 0609) Pulse Rate:  [87-90] 90  (06/28 0609) Resp:  [18-26] 26  (06/28 0609) BP: (146-149)/(68-72) 147/68 mmHg (06/28 0609) SpO2:  [95 %-97 %] 97 % (06/28 0609) Last BM Date: 06/20/12  Intake/Output from previous day: 06/27 0701 - 06/28 0700 In: 680 [P.O.:680] Out: 1203 [Urine:1200; Stool:3] Intake/Output this shift: Total I/O In: 120 [P.O.:120] Out: 150 [Urine:150]  General appearance: alert and no distress GI: soft, non-tender; bowel sounds normal; no masses,  no organomegaly  Lab Results: No results found for this basename: WBC:3,HGB:3,HCT:3,PLT:3 in the last 72 hours BMET  [facility]ename 06/18/12 0428  NA 138  K 3.9  CL 109  CO2 20  GLUCOSE 104*  BUN 30*  CREATININE 1.49*  CALCIUM 7.9*   LFT No results found for this basename: PROT,ALBUMIN,AST,ALT,ALKPHOS,BILITOT,BILIDIR,IBILI in the last 72 hours PT/INR No results found for this basename: LABPROT:2,INR:2 in the last 72 hours Hepatitis Panel No results found for this basename: HEPBSAG,HCVAB,HEPAIGM,HEPBIGM in the last 72 hours C-Diff No results found for this basename: CDIFFTOX:3 in the last 72 hours Fecal Lactopherrin No results found for this basename: FECLLACTOFRN in the last 72 hours  Studies/Results: No results found.  Medications:  Scheduled:   . antiseptic oral rinse  15 mL Mouth Rinse BID  . aspirin  81 mg Oral Daily  . dipyridamole  100 mg Oral BID  . donepezil  5 mg Oral QHS  . feeding supplement  237 mL Oral BID BM  . Fluticasone-Salmeterol  1 puff Inhalation Q12H  . levofloxacin  250 mg Oral Daily  . mirtazapine  7.5 mg Oral QHS  . nystatin  5 mL Oral QID  . pantoprazole  40 mg Oral Q0600  . rOPINIRole  0.5 mg Oral q1800  . simvastatin  10 mg Oral q1800  . sodium chloride  2 spray Nasal q morning - 10a   Continuous:   Assessment/Plan: 1) Esophageal  stricture. 2) Dysphagia.   I will perform the EGD tomorrow with dilation.  She will also be discontinued on her dipyridamole for the procedure.  Plan: 1) EGD tomorrow. 2) Hold Dipyridamole.  LOS: 7 days   Sharolyn Weber D 06/20/2012, 10:12 AM   

## 2012-06-21 NOTE — Op Note (Signed)
[facility] [address] [place], [place]  [address]  OPERATIVE PROCEDURE REPORT  PATIENT:  Clessie, Karras  MR#:  [id number] BIRTHDATE:  04-09-1922  GENDER:  female ENDOSCOPIST:  Jeani Hawking, MD ASSISTANT:  Judithann Sauger, RN and Kandice Robinsons PROCEDURE DATE:  06/21/2012 PROCEDURE:  EGD with balloon dilatation ASA CLASS:  Class III INDICATIONS:  Dysphagia and Esophageal stricture MEDICATIONS:  Fentanyl 25 mcg IV, Versed 2 mg IV  DESCRIPTION OF PROCEDURE:   After the risks benefits and alternatives of the procedure were thoroughly explained, informed consent was obtained.  The Pentax Gastroscope [id number] endoscope was introduced through the mouth and advanced to the second portion of the duodenum, without limitations.  The instrument was slowly withdrawn as the mucosa was fully examined. <<PROCEDUREIMAGES>>  FINDINGS:  In the distal esophagus a discrete peptic stricture was identified in the setting of a 5 cm hiatal hernia. The area was dilated with a 16.5 mm balloon and held in place for 1 minute. A small, and expected, post dilation tear was noted at the stricture site. No post procedure crepitus noted. No other abnormalities identified in the upper GI tract.    Retroflexed views revealed no abnormalities.    The scope was then withdrawn from the patient and the procedure terminated.  COMPLICATIONS:  None  IMPRESSION:  1) Esophageal tricture - Benign. 2) Hiatal Hernia. RECOMMENDATIONS:  1) Resume diet and then advance as tolerated. 2) Resume Plavix and dipyridamole tomorrow.  ______________________________ Jeani Hawking, MD  n. Rosalie DoctorJeani Hawking at 06/21/2012 10:47 AM  Kandace Blitz, [phone]

## 2012-06-21 NOTE — Interval H&P Note (Signed)
History and Physical Interval Note:  06/21/2012 10:11 AM  [name]  has presented today for surgery, with the diagnosis of Abnormal esophagram  The various methods of treatment have been discussed with the patient and family. After consideration of risks, benefits and other options for treatment, the patient has consented to  Procedure(s) (LRB): ESOPHAGOGASTRODUODENOSCOPY (EGD) (N/A) as a surgical intervention .  The patient's history has been reviewed, patient examined, no change in status, stable for surgery.  I have reviewed the patients' chart and labs.  Questions were answered to the patient's satisfaction.     Kirby Cortese D

## 2012-06-21 NOTE — Progress Notes (Signed)
[facility] PROGRESS NOTE  [name] JXB:[id number] DOB: [birth year] DOA: 06/13/2012 PCP: No primary provider on file.   Assessment/Plan: 1. UTI (urinary tract infection): Patient was found to have a UTI in the facility and was placed on Bactrim 2 days ago, and presented with fever and chills. Urinalysis showed positive sediment, with pyuria and significant bacteriuria. She was initially commenced on iv Ciprofloxacin, but was transitioned to Levaquin on 06/15/12, to add some respiratory tract coverage (now day#7 antibiotics). She remains apyrexial and urine culture is negative so far. We shall transition to oral Levaquin, for a total of 7 days antibiotic therapy.   2. Dehydration/ARF (acute renal failure): Patient presented with BUN 81 and creatinine 3.93, consistent with dehydration and ARF, against a background of UTI, poor oral intake and diuretics. Unfortunately, baseline creatinine is unknown at this time. We are managing with iv fluids, diuretics have been discontinued, and creatinine is improving gradually.  Following renal markers- improved  3. Hyponatremia: Patient had a hyponatremia of 128 at presentation, secondary to diuretics and volume depletion. Managing as described above,  resolved.   4. Hypokalemia: Repleted as indicated. Now resolved.   5. Hypertension: BP is reasonably controlled, at this time, on beta-blocker. Will not escalate further, as per daughter, patient sometimes becomes hypotensive, with more aggressive BP control.   6. Peripheral arterial disease: Not problematic.   7. Anxiety/Dementia: Stable on pre-admission psychotropics.   8. Dyslipidemia: Continue Statin.   9. Esophageal stricture: Patient gets periodic esophageal dilatations, by primary GI, Dr Katrinka Blazing in [place]. According to her, this is due soon. Patient continues to cough frequently. Dr Charna Elizabeth provided GI consultation, and barium esophagogram done 06/16/12, showed marked esophageal  dysmotility with possible diffuse esophageal spasm, mucosal irregularity involving the left lateral aspect of the mid/distal esophagus, no definite fixed esophageal narrowing or stricture. However, the 13 mm barium tablet failed to pass the mid esophagus. Holding plavix.  S/p dilatation- hope to advnace diet and have patient ready for d/c on Monday   10. Oral thrush: This was an incidental finding. Patient was commenced on Nystatin suspension on 06/14/12, now day# 7  11. PT- if does ok, then walk in hallways  Code Status: DNR Family Communication:  Daughter updated Disposition Plan: EGD this weekend- then back to assisted living   Marlin Canary, D.O.  [facility] Pager [phone]   06/21/2012, 1:20 PM  LOS: 8 days    Consultants:  GI  Antibiotics:  levaquin- d/c'd   Subjective: S/p dilatation- no concerns No pain   Objective: Filed Vitals:   06/21/12 1052 06/21/12 1102 06/21/12 1112 06/21/12 1123  BP: 184/90 178/69 171/74 145/69  Pulse:      Temp:    98.6 F (37 C)  TempSrc:      Resp: 22 24 22 18   Height:      Weight:      SpO2: 97% 99% 98% 96%    Intake/Output Summary (Last 24 hours) at 06/21/12 1320 Last data filed at 06/21/12 1022  Gross per 24 hour  Intake    315 ml  Output    800 ml  Net   -485 ml    Exam:  General: Comfortable, alert, communicative HEENT: Mild clinical pallor, no jaundice, no conjunctival injection or discharge.  NECK: Supple, JVP not seen, no carotid bruits, no palpable lymphadenopathy, no palpable goiter.  CHEST: Clinically clear to auscultation, no wheezes, no crackles.  HEART: Sounds 1 and 2 heard, normal, regular, no murmurs.  ABDOMEN: Flat, soft, non-tender, no palpable organomegaly, no palpable masses, normal bowel sounds.  LOWER EXTREMITIES: No pitting edema, palpable peripheral pulses.  MUSCULOSKELETAL SYSTEM: Generalized osteoarthritic changes, otherwise, normal.  CENTRAL NERVOUS SYSTEM: No focal neurologic  deficit on gross examination.   Data Reviewed: Basic Metabolic Panel:  Lab 06/21/12 1610 06/18/12 0428 06/17/12 0425 06/16/12 0436 06/15/12 0528  NA 137 138 136 133* 132*  K 4.4 3.9 -- -- --  CL 102 109 107 102 100  CO2 30 20 19 19  18*  GLUCOSE 94 104* 98 88 89  BUN 20 30* 43* 57* 71*  CREATININE 1.32* 1.49* 1.93* 2.52* 3.25*  CALCIUM 8.6 7.9* 8.0* 8.4 8.4  MG -- -- -- -- --  PHOS -- -- -- -- --   Liver Function Tests:  Lab 06/16/12 0436  AST 27  ALT 16  ALKPHOS 92  BILITOT 0.4  PROT 5.8*  ALBUMIN 2.2*   No results found for this basename: LIPASE:5,AMYLASE:5 in the last 168 hours No results found for this basename: AMMONIA:5 in the last 168 hours CBC:  Lab 06/21/12 0510 06/17/12 0425 06/16/12 0436 06/15/12 0528  WBC 8.4 11.6* 15.7* 16.7*  NEUTROABS -- -- -- --  HGB 9.6* 10.2* 11.3* 11.5*  HCT 30.0* 30.9* 34.1* 33.8*  MCV 90.1 87.5 87.9 87.1  PLT 244 162 137* 124*   Cardiac Enzymes: No results found for this basename: CKTOTAL:5,CKMB:5,CKMBINDEX:5,TROPONINI:5 in the last 168 hours BNP: No components found with this basename: POCBNP:5 CBG: No results found for this basename: GLUCAP:5 in the last 168 hours  Recent Results (from the past 240 hour(s))  MRSA PCR SCREENING     Status: Normal   Collection Time   06/14/12  2:14 AM      Component Value Range Status Comment   MRSA by PCR NEGATIVE  NEGATIVE Final   URINE CULTURE     Status: Normal   Collection Time   06/15/12  1:45 PM      Component Value Range Status Comment   Specimen Description URINE, CLEAN CATCH   Final    Special Requests NONE   Final    Culture  Setup Time [id number]   Final    Colony Count NO GROWTH   Final    Culture NO GROWTH   Final    Report Status 06/17/2012 FINAL   Final      Studies: Ct Abdomen Pelvis Wo Contrast  06/13/2012  *RADIOLOGY REPORT*  Clinical Data: 2-day history of left flank pain.  Hematuria. Recent diagnosis of urinary tract infection.  CT ABDOMEN AND PELVIS WITHOUT  CONTRAST  Technique:  Multidetector CT imaging of the abdomen and pelvis was performed following the standard protocol without intravenous contrast.  Comparison: None.  Findings: No evidence of urinary tract calculi or obstruction on either side.  Cortical calcification cortical calcification involving the anterior aspect of the lower pole left kidney. Approximate 3.3 x 3.0 cm simple cyst arising from the upper pole of the left kidney.  Approximate 4.2 x 2.6 cm cyst arising from the lateral aspect of the lower pole and approximate 1.3 cm diameter cyst arising from the medial aspect of the lower pole left kidney. Approximate 1.6 cm diameter simple cyst arising from the lower pole right kidney.  Within the limits of the unenhanced technique, no solid masses involving either kidney.  Right kidney mildly atrophic, with compensatory enlargement of the left kidney.  Normal unenhanced appearance of the liver, spleen, pancreas, and right adrenal gland.  Approximate 1.6  x 1.2 cm low attenuation nodule involving the left adrenal gland.  Gallbladder unremarkable by CT.  No biliary ductal dilation.  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  No significant lymphadenopathy.  Small hiatal hernia.  Stomach decompressed otherwise unremarkable. Normal-appearing small bowel.  Extensive sigmoid colon diverticulosis without evidence of acute diverticulitis.  Remainder the colon unremarkable.  No ascites.  Urinary bladder decompressed and unremarkable.  Uterus atrophic consistent with age.  No adnexal masses or free pelvic fluid.  Bone window images demonstrate degenerative changes involving the lower thoracic and lumbar spine and both hips.  Visualized lung bases emphysematous but clear.  Heart size upper normal.  IMPRESSION:  1.  No evidence of urinary tract calculi or obstruction on either side. 2.  Multiple bilateral renal cysts.  Mildly atrophic right kidney. 3.  Small left adrenal nodule, statistically  consistent with an adenoma. 4.  Sigmoid colon diverticulosis without evidence of acute diverticulitis. 5.  Small hiatal hernia.  Original Report Authenticated By: Arnell Sieving, M.D.   Dg Chest 2 View  06/13/2012  *RADIOLOGY REPORT*  Clinical Data: Cough and fever.  CHEST - 2 VIEW  Comparison: 09/18/2011  Findings: Cardiomegaly and hyperinflation again noted. Mild peribronchial thickening is unchanged. There is no evidence of focal airspace disease, pulmonary edema, suspicious pulmonary nodule/mass, pleural effusion, or pneumothorax. No acute bony abnormalities are identified.  IMPRESSION: No evidence of acute cardiopulmonary disease.  Cardiomegaly with mild chronic peribronchial thickening and hyperinflation.  Original Report Authenticated By: Rosendo Gros, M.D.   Dg Esophagus  06/16/2012  *RADIOLOGY REPORT*  Clinical Data: Esophageal stricture, cough, dysphasia  ESOPHOGRAM/BARIUM SWALLOW  Technique:  Single contrast examination was performed using thin barium.  Fluoroscopy time:  1.2 minutes.  Comparison:  None.  Findings:  Marked esophageal dysmotility with numerous tertiary/nonperistaltic contractions and/or diffuse esophageal spasm.  Although suboptimally evaluated, the left lateral aspect of the mid/distal esophagus appears irregular (series 8/image 1), underlying mucosal lesion not excluded.  No definite fixed esophageal narrowing or stricture.  However, the 13 mm barium tablet failed to pass the mid esophagus.  IMPRESSION: Marked esophageal dysmotility with possible diffuse esophageal spasm.  Mucosal irregularity involving the left lateral aspect of the mid/distal esophagus. Endoscopic correlation is suggested to exclude underlying mucosal lesion.  No definite fixed esophageal narrowing or stricture.  However, the 13 mm barium tablet failed to pass the mid esophagus.  Original Report Authenticated By: Charline Bills, M.D.    Scheduled Meds:    . sodium chloride   Intravenous Once  .  antiseptic oral rinse  15 mL Mouth Rinse BID  . aspirin  81 mg Oral Daily  . donepezil  5 mg Oral QHS  . feeding supplement  237 mL Oral BID BM  . Fluticasone-Salmeterol  1 puff Inhalation Q12H  . mirtazapine  7.5 mg Oral QHS  . nystatin  5 mL Oral QID  . pantoprazole  40 mg Oral Q0600  . rOPINIRole  0.5 mg Oral q1800  . simvastatin  10 mg Oral q1800  . sodium chloride  2 spray Nasal q morning - 10a  . DISCONTD: nystatin  5 mL Oral QID   Continuous Infusions:

## 2012-06-22 DIAGNOSIS — K222 Esophageal obstruction: Secondary | ICD-10-CM

## 2012-06-22 DIAGNOSIS — E782 Mixed hyperlipidemia: Secondary | ICD-10-CM

## 2012-06-22 DIAGNOSIS — N179 Acute kidney failure, unspecified: Secondary | ICD-10-CM

## 2012-06-22 DIAGNOSIS — E860 Dehydration: Secondary | ICD-10-CM

## 2012-06-22 LAB — CBC
HCT: 29.4 % — ABNORMAL LOW (ref 36.0–46.0)
WBC: 8.4 10*3/uL (ref 4.0–10.5)

## 2012-06-22 MED ORDER — CLOPIDOGREL BISULFATE 75 MG PO TABS
75.0000 mg | ORAL_TABLET | Freq: Every day | ORAL | Status: DC
  Administered 2012-06-22 – 2012-06-23 (×2): 75 mg via ORAL
  Filled 2012-06-22 (×2): qty 1

## 2012-06-22 NOTE — Progress Notes (Signed)
[facility] PROGRESS NOTE  Chenita Ruda ZOX:[id number] DOB: 02-23-1922 DOA: 06/13/2012 PCP: No primary provider on file.   Assessment/Plan: 1. UTI (urinary tract infection): Patient was found to have a UTI in the facility and was placed on Bactrim 2 days ago, and presented with fever and chills. Urinalysis showed positive sediment, with pyuria and significant bacteriuria. She was initially commenced on iv Ciprofloxacin, but was transitioned to Levaquin on 06/15/12, to add some respiratory tract coverage . She remains apyrexial and urine culture is negative so far. We shall transition to oral Levaquin, for a total of 7 days antibiotic therapy.   2. Dehydration/ARF (acute renal failure): Patient presented with BUN 81 and creatinine 3.93, consistent with dehydration and ARF, against a background of UTI, poor oral intake and diuretics. Unfortunately, baseline creatinine is unknown at this time. We are managing with iv fluids, diuretics have been discontinued, and creatinine is improving gradually.  Following renal markers- improved  3. Hyponatremia: Patient had a hyponatremia of 128 at presentation, secondary to diuretics and volume depletion. Managing as described above,  resolved.   4. Hypokalemia: Repleted as indicated. Now resolved.   5. Hypertension: BP is reasonably controlled, at this time, on beta-blocker. Will not escalate further, as per daughter, patient sometimes becomes hypotensive, with more aggressive BP control.   6. Peripheral arterial disease: Not problematic.   7. Anxiety/Dementia: Stable on pre-admission psychotropics.   8. Dyslipidemia: Continue Statin.   9. Esophageal stricture: Patient gets periodic esophageal dilatations, by primary GI, Dr Katrinka Blazing in [place]. According to her, this is due soon. Patient continues to cough frequently. Dr Charna Elizabeth provided GI consultation, and barium esophagogram done 06/16/12, showed marked esophageal dysmotility with possible  diffuse esophageal spasm, mucosal irregularity involving the left lateral aspect of the mid/distal esophagus, no definite fixed esophageal narrowing or stricture. However, the 13 mm barium tablet failed to pass the mid esophagus. Holding plavix.  S/p dilatation- hope to advnace diet and have patient ready for d/c on Monday   10. Oral thrush: This was an incidental finding. Patient was commenced on Nystatin suspension on 06/14/12- changed to magic mouthwash for canker sores  11. PT- if does ok, then walk in hallways  Code Status: DNR Family Communication:  Daughter updated Disposition Plan: back to assisted living with [facility]   Marlin Canary, D.O.  [facility] Pager [phone]   06/22/2012, 10:27 AM  LOS: 9 days    Consultants:  GI  Antibiotics:  levaquin- d/c'd   Subjective: S/p dilatation- no concerns No pain   Objective: Filed Vitals:   06/21/12 1826 06/21/12 2137 06/21/12 2226 06/22/12 0700  BP: 140/65  153/63 151/66  Pulse:  97 94 93  Temp: 98.1 F (36.7 C)  99.3 F (37.4 C) 98.9 F (37.2 C)  TempSrc: Axillary  Axillary Oral  Resp: 20 18 19 18   Height:      Weight:      SpO2: 98% 95% 95% 92%    Intake/Output Summary (Last 24 hours) at 06/22/12 1027 Last data filed at 06/22/12 0704  Gross per 24 hour  Intake    950 ml  Output    703 ml  Net    247 ml    Exam:  General: Comfortable, alert, communicative HEENT: Mild clinical pallor, no jaundice, no conjunctival injection or discharge.  NECK: Supple, JVP not seen, no carotid bruits, no palpable lymphadenopathy, no palpable goiter.  CHEST: Clinically clear to auscultation, no wheezes, no crackles.  HEART: Sounds 1 and  2 heard, normal, regular, no murmurs.  ABDOMEN: Flat, soft, non-tender, no palpable organomegaly, no palpable masses, normal bowel sounds.  LOWER EXTREMITIES: No pitting edema, palpable peripheral pulses.  MUSCULOSKELETAL SYSTEM: Generalized osteoarthritic changes, otherwise,  normal.  CENTRAL NERVOUS SYSTEM: No focal neurologic deficit on gross examination.   Data Reviewed: Basic Metabolic Panel:  Lab 06/21/12 4098 06/18/12 0428 06/17/12 0425 06/16/12 0436  NA 137 138 136 133*  K 4.4 3.9 -- --  CL 102 109 107 102  CO2 30 20 19 19   GLUCOSE 94 104* 98 88  BUN 20 30* 43* 57*  CREATININE 1.32* 1.49* 1.93* 2.52*  CALCIUM 8.6 7.9* 8.0* 8.4  MG -- -- -- --  PHOS -- -- -- --   Liver Function Tests:  Lab 06/16/12 0436  AST 27  ALT 16  ALKPHOS 92  BILITOT 0.4  PROT 5.8*  ALBUMIN 2.2*   No results found for this basename: LIPASE:5,AMYLASE:5 in the last 168 hours No results found for this basename: AMMONIA:5 in the last 168 hours CBC:  Lab 06/22/12 0432 06/21/12 0510 06/17/12 0425 06/16/12 0436  WBC 8.4 8.4 11.6* 15.7*  NEUTROABS -- -- -- --  HGB 9.4* 9.6* 10.2* 11.3*  HCT 29.4* 30.0* 30.9* 34.1*  MCV 90.2 90.1 87.5 87.9  PLT 258 244 162 137*   Cardiac Enzymes: No results found for this basename: CKTOTAL:5,CKMB:5,CKMBINDEX:5,TROPONINI:5 in the last 168 hours BNP: No components found with this basename: POCBNP:5 CBG: No results found for this basename: GLUCAP:5 in the last 168 hours  Recent Results (from the past 240 hour(s))  MRSA PCR SCREENING     Status: Normal   Collection Time   06/14/12  2:14 AM      Component Value Range Status Comment   MRSA by PCR NEGATIVE  NEGATIVE Final   URINE CULTURE     Status: Normal   Collection Time   06/15/12  1:45 PM      Component Value Range Status Comment   Specimen Description URINE, CLEAN CATCH   Final    Special Requests NONE   Final    Culture  Setup Time [id number]   Final    Colony Count NO GROWTH   Final    Culture NO GROWTH   Final    Report Status 06/17/2012 FINAL   Final      Studies: Ct Abdomen Pelvis Wo Contrast  06/13/2012  *RADIOLOGY REPORT*  Clinical Data: 2-day history of left flank pain.  Hematuria. Recent diagnosis of urinary tract infection.  CT ABDOMEN AND PELVIS WITHOUT  CONTRAST  Technique:  Multidetector CT imaging of the abdomen and pelvis was performed following the standard protocol without intravenous contrast.  Comparison: None.  Findings: No evidence of urinary tract calculi or obstruction on either side.  Cortical calcification cortical calcification involving the anterior aspect of the lower pole left kidney. Approximate 3.3 x 3.0 cm simple cyst arising from the upper pole of the left kidney.  Approximate 4.2 x 2.6 cm cyst arising from the lateral aspect of the lower pole and approximate 1.3 cm diameter cyst arising from the medial aspect of the lower pole left kidney. Approximate 1.6 cm diameter simple cyst arising from the lower pole right kidney.  Within the limits of the unenhanced technique, no solid masses involving either kidney.  Right kidney mildly atrophic, with compensatory enlargement of the left kidney.  Normal unenhanced appearance of the liver, spleen, pancreas, and right adrenal gland.  Approximate 1.6 x 1.2 cm low attenuation  nodule involving the left adrenal gland.  Gallbladder unremarkable by CT.  No biliary ductal dilation.  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  No significant lymphadenopathy.  Small hiatal hernia.  Stomach decompressed otherwise unremarkable. Normal-appearing small bowel.  Extensive sigmoid colon diverticulosis without evidence of acute diverticulitis.  Remainder the colon unremarkable.  No ascites.  Urinary bladder decompressed and unremarkable.  Uterus atrophic consistent with age.  No adnexal masses or free pelvic fluid.  Bone window images demonstrate degenerative changes involving the lower thoracic and lumbar spine and both hips.  Visualized lung bases emphysematous but clear.  Heart size upper normal.  IMPRESSION:  1.  No evidence of urinary tract calculi or obstruction on either side. 2.  Multiple bilateral renal cysts.  Mildly atrophic right kidney. 3.  Small left adrenal nodule, statistically  consistent with an adenoma. 4.  Sigmoid colon diverticulosis without evidence of acute diverticulitis. 5.  Small hiatal hernia.  Original Report Authenticated By: Arnell Sieving, M.D.   Dg Chest 2 View  06/13/2012  *RADIOLOGY REPORT*  Clinical Data: Cough and fever.  CHEST - 2 VIEW  Comparison: 09/18/2011  Findings: Cardiomegaly and hyperinflation again noted. Mild peribronchial thickening is unchanged. There is no evidence of focal airspace disease, pulmonary edema, suspicious pulmonary nodule/mass, pleural effusion, or pneumothorax. No acute bony abnormalities are identified.  IMPRESSION: No evidence of acute cardiopulmonary disease.  Cardiomegaly with mild chronic peribronchial thickening and hyperinflation.  Original Report Authenticated By: Rosendo Gros, M.D.   Dg Esophagus  06/16/2012  *RADIOLOGY REPORT*  Clinical Data: Esophageal stricture, cough, dysphasia  ESOPHOGRAM/BARIUM SWALLOW  Technique:  Single contrast examination was performed using thin barium.  Fluoroscopy time:  1.2 minutes.  Comparison:  None.  Findings:  Marked esophageal dysmotility with numerous tertiary/nonperistaltic contractions and/or diffuse esophageal spasm.  Although suboptimally evaluated, the left lateral aspect of the mid/distal esophagus appears irregular (series 8/image 1), underlying mucosal lesion not excluded.  No definite fixed esophageal narrowing or stricture.  However, the 13 mm barium tablet failed to pass the mid esophagus.  IMPRESSION: Marked esophageal dysmotility with possible diffuse esophageal spasm.  Mucosal irregularity involving the left lateral aspect of the mid/distal esophagus. Endoscopic correlation is suggested to exclude underlying mucosal lesion.  No definite fixed esophageal narrowing or stricture.  However, the 13 mm barium tablet failed to pass the mid esophagus.  Original Report Authenticated By: Charline Bills, M.D.    Scheduled Meds:    . antiseptic oral rinse  15 mL Mouth Rinse  BID  . aspirin  81 mg Oral Daily  . clopidogrel  75 mg Oral Daily  . donepezil  5 mg Oral QHS  . feeding supplement  237 mL Oral BID BM  . Fluticasone-Salmeterol  1 puff Inhalation Q12H  . magic mouthwash w/lidocaine  5 mL Oral TID  . mirtazapine  7.5 mg Oral QHS  . pantoprazole  40 mg Oral Q0600  . rOPINIRole  0.5 mg Oral q1800  . simvastatin  10 mg Oral q1800  . sodium chloride  2 spray Nasal q morning - 10a  . DISCONTD: magic mouthwash w/lidocaine  5 mL Oral TID  . DISCONTD: nystatin  5 mL Oral QID   Continuous Infusions:

## 2012-06-22 NOTE — Progress Notes (Signed)
Occupational Therapy Note Chart reviewed. Pt eating breakfast when OT came by. Will try back at a later time. Judithann Sauger OTR/L [phone] 06/22/2012

## 2012-06-22 NOTE — Progress Notes (Signed)
Subjective: Swallowing better.  Objective: Vital signs in last 24 hours: Temp:  [98.1 F (36.7 C)-99.3 F (37.4 C)] 98.9 F (37.2 C) (06/30 0700) Pulse Rate:  [85-97] 93  (06/30 0700) Resp:  [18-24] 18  (06/30 0700) BP: (139-193)/(63-103) 151/66 mmHg (06/30 0700) SpO2:  [92 %-99 %] 92 % (06/30 0700) Last BM Date: 06/20/12  Intake/Output from previous day: 06/29 0701 - 06/30 0700 In: 1025 [P.O.:480; I.V.:75] Out: 1152 [Urine:1150; Stool:2] Intake/Output this shift: Total I/O In: -  Out: 1 [Stool:1]  General appearance: alert and no distress GI: soft, non-tender; bowel sounds normal; no masses,  no organomegaly  Lab Results:  Basename 06/22/12 0432 06/21/12 0510  WBC 8.4 8.4  HGB 9.4* 9.6*  HCT 29.4* 30.0*  PLT 258 244   BMET  Basename 06/21/12 0510  NA 137  K 4.4  CL 102  CO2 30  GLUCOSE 94  BUN 20  CREATININE 1.32*  CALCIUM 8.6   LFT No results found for this basename: PROT,ALBUMIN,AST,ALT,ALKPHOS,BILITOT,BILIDIR,IBILI in the last 72 hours PT/INR No results found for this basename: LABPROT:2,INR:2 in the last 72 hours Hepatitis Panel No results found for this basename: HEPBSAG,HCVAB,HEPAIGM,HEPBIGM in the last 72 hours C-Diff No results found for this basename: CDIFFTOX:3 in the last 72 hours Fecal Lactopherrin No results found for this basename: FECLLACTOFRN in the last 72 hours  Studies/Results: No results found.  Medications:  Scheduled:   . sodium chloride   Intravenous Once  . antiseptic oral rinse  15 mL Mouth Rinse BID  . aspirin  81 mg Oral Daily  . donepezil  5 mg Oral QHS  . feeding supplement  237 mL Oral BID BM  . Fluticasone-Salmeterol  1 puff Inhalation Q12H  . magic mouthwash w/lidocaine  5 mL Oral TID  . mirtazapine  7.5 mg Oral QHS  . pantoprazole  40 mg Oral Q0600  . rOPINIRole  0.5 mg Oral q1800  . simvastatin  10 mg Oral q1800  . sodium chloride  2 spray Nasal q morning - 10a  . DISCONTD: magic mouthwash w/lidocaine  5 mL  Oral TID  . DISCONTD: nystatin  5 mL Oral QID   Continuous:   Assessment/Plan: 1) Esophageal stricture s/p dilation with a 16.5 mm balloon. 2) Dysphagia.    Improved swallowing per her report.  Plan:  1) Advance diet as tolerated. 2) Okay to restart Plavix and dipyridamole. 3) Signing off.   LOS: 9 days   [name] D 06/22/2012, 8:17 AM

## 2012-06-23 ENCOUNTER — Encounter (HOSPITAL_COMMUNITY): Payer: Self-pay | Admitting: Gastroenterology

## 2012-06-23 DIAGNOSIS — K222 Esophageal obstruction: Secondary | ICD-10-CM

## 2012-06-23 DIAGNOSIS — E876 Hypokalemia: Secondary | ICD-10-CM

## 2012-06-23 DIAGNOSIS — N390 Urinary tract infection, site not specified: Secondary | ICD-10-CM

## 2012-06-23 DIAGNOSIS — N179 Acute kidney failure, unspecified: Principal | ICD-10-CM

## 2012-06-23 MED ORDER — PANTOPRAZOLE SODIUM 40 MG PO TBEC: 40.0000 mg | DELAYED_RELEASE_TABLET | Freq: Every day | ORAL | Status: DC

## 2012-06-23 MED ORDER — BENZOCAINE 20 % MT PSTE: 1.0000 "application " | PASTE | Freq: Four times a day (QID) | OROMUCOSAL | Status: DC | PRN

## 2012-06-23 MED ORDER — HYDROCHLOROTHIAZIDE 25 MG PO TABS: 12.5000 mg | ORAL_TABLET | Freq: Every day | ORAL | Status: DC

## 2012-06-23 MED ORDER — ENSURE PUDDING PO PUDG
1.0000 | Freq: Every day | ORAL | Status: DC
  Filled 2012-06-23: qty 1

## 2012-06-23 MED ORDER — ALPRAZOLAM 0.25 MG PO TABS: 0.2500 mg | ORAL_TABLET | Freq: Two times a day (BID) | ORAL | Status: AC | PRN

## 2012-06-23 MED ORDER — MAGIC MOUTHWASH W/LIDOCAINE: 5.0000 mL | Freq: Three times a day (TID) | ORAL | Status: DC

## 2012-06-23 MED ORDER — ENSURE COMPLETE PO LIQD: 237.0000 mL | Freq: Two times a day (BID) | ORAL | Status: DC

## 2012-06-23 NOTE — Discharge Summary (Addendum)
Discharge Summary  [name] MR#: [id number]  DOB:[birth year]  Date of Admission: 06/13/2012 Date of Discharge: 06/23/2012  Patient's PCP: No primary provider on file.  Attending Physician:Shaine Mount  Consults:   GI  Discharge Diagnoses: Principal Problem:  *ARF (acute renal failure) Active Problems:  Hypertension  Peripheral arterial disease  Osteoarthritis  Anxiety  UTI (urinary tract infection)  Hyponatremia  Hypokalemia  Thrombocytopenia  Leukocytosis  Stricture and stenosis of esophagus   Brief Admitting History and Physical [name] is an 78 y.o. female resident of [facility] who was sent to the ED due to complaints of fevers and chills today, and lower ABD Pain radiating into her back worsening over the past 5 days. She was found to have a UTI in the facility and was placed on Bactrim 2 days ago but her symptoms worsened. She also has had a cough but her daughter reports that this usually happens when she need her esophagus dilated due to a stricture that she has. In the ED she was found to have an elevated BUN/Cr, and a +UA, and a CT scan without contrast was performed which did not reveal pyelonephritis nor a kidney stone. She was placed on IV Cipro due to her allergies and recent Bactrim therapy.    Discharge Medications Medication List  As of 06/23/2012  2:59 PM   STOP taking these medications         dipyridamole 50 MG tablet      hydrochlorothiazide 25 MG tablet      ranitidine 300 MG capsule      sulfamethoxazole-trimethoprim 800-160 MG per tablet         TAKE these medications         albuterol 108 (90 BASE) MCG/ACT inhaler   Commonly known as: PROVENTIL HFA;VENTOLIN HFA   Inhale 2 puffs into the lungs every 4 (four) hours as needed. Wheezing/shortness of breath.      ALPRAZolam 0.25 MG tablet   Commonly known as: XANAX   Take 1 tablet (0.25 mg total) by mouth 2 (two) times daily as needed for anxiety.      aspirin 81 MG chewable  tablet   Chew 81 mg by mouth daily.      benzocaine 20 % Pste   Commonly known as: ORABASE-B   Use as directed 1 application in the mouth or throat 4 (four) times daily as needed.      clopidogrel 75 MG tablet   Commonly known as: PLAVIX   Take 75 mg by mouth daily.      donepezil 5 MG tablet   Commonly known as: ARICEPT   Take 5 mg by mouth at bedtime.      feeding supplement Liqd   Take 237 mLs by mouth 2 (two) times daily between meals.      Fluticasone-Salmeterol 100-50 MCG/DOSE Aepb   Commonly known as: ADVAIR   Inhale 1 puff into the lungs every 12 (twelve) hours.      magic mouthwash w/lidocaine Soln   Take 5 mLs by mouth 3 (three) times daily.      MAPAP 325 MG tablet   Generic drug: acetaminophen   Take 650 mg by mouth every 4 (four) hours as needed. Pain.      Melatonin 1 MG Tabs   Take 1 mg by mouth at bedtime.      metoprolol tartrate 25 MG tablet   Commonly known as: LOPRESSOR   Take 25 mg by mouth 2 (two) times daily.  mirtazapine 15 MG tablet   Commonly known as: REMERON   Take 7.5 mg by mouth at bedtime.      pantoprazole 40 MG tablet   Commonly known as: PROTONIX   Take 1 tablet (40 mg total) by mouth daily at 6 (six) AM.      pravastatin 20 MG tablet   Commonly known as: PRAVACHOL   Take 20 mg by mouth daily.      promethazine 12.5 MG tablet   Commonly known as: PHENERGAN   Take 12.5 mg by mouth every 6 (six) hours as needed. Nausea.      REQUIP 0.5 MG tablet   Generic drug: rOPINIRole   Take 0.5 mg by mouth every evening.      sodium chloride 0.65 % Soln nasal spray   Commonly known as: OCEAN   Place 2 sprays into the nose every morning. May keep at bedside.      Vitamin D 1000 UNITS capsule   Take 1,000 Units by mouth daily.            Hospital Course: 1. UTI (urinary tract infection): Patient was found to have a UTI in the facility and was placed on Bactrim 2 days ago, and presented with fever and chills. Urinalysis showed  positive sediment, with pyuria and significant bacteriuria. She was initially commenced on iv Ciprofloxacin, but was transitioned to Levaquin on 06/15/12, to add some respiratory tract coverage . She remains apyrexial and urine culture is negative so far. We shall transition to oral Levaquin, for a total of 7 days antibiotic therapy- complete   2. Dehydration/ARF (acute renal failure): Patient presented with BUN 81 and creatinine 3.93, consistent with dehydration and ARF, against a background of UTI, poor oral intake and diuretics. Unfortunately, baseline creatinine is unknown at this time. improved   3. Hyponatremia: Patient had a hyponatremia of 128 at presentation, secondary to diuretics and volume depletion. Managing as described above, resolved.   4. Hypokalemia: Repleted as indicated. Now resolved.   5. Hypertension: BP is reasonably controlled, at this time, on beta-blocker. Will not escalate further, as per daughter, patient sometimes becomes hypotensive, with more aggressive BP control.   6. Peripheral arterial disease: Not problematic.   7. Anxiety/Dementia: Stable on pre-admission psychotropics.   8. Dyslipidemia: Continue Statin.   9. Esophageal stricture: Patient gets periodic esophageal dilatations, by primary GI, Dr Katrinka Blazing in [place]. According to her, this is due soon. Patient continues to cough frequently.  barium esophagogram done 06/16/12, showed marked esophageal dysmotility with possible diffuse esophageal spasm, mucosal irregularity involving the left lateral aspect of the mid/distal esophagus, no definite fixed esophageal narrowing or stricture. However, the 13 mm barium tablet failed to pass the mid esophagus.  S/p dilatation- hope to advance diet   10. Oral thrush: This was an incidental finding. Patient was commenced on Nystatin suspension on 06/14/12- changed to magic mouthwash for canker sores         Day of Discharge BP 157/65  Pulse 95  Temp 98.5 F (36.9 C)  (Oral)  Resp 20  Ht 5' (1.524 m)  Wt 41.3 kg (91 lb 0.8 oz)  BMI 17.78 kg/m2  SpO2 98% A+Ox3 NAD -c/c/e +BS, soft NT/ND rrr  Results for orders placed during the hospital encounter of 06/13/12 (from the past 48 hour(s))  CBC     Status: Abnormal   Collection Time   06/22/12  4:32 AM      Component Value Range Comment   WBC 8.4  4.0 - 10.5 K/uL    RBC 3.26 (*) 3.87 - 5.11 MIL/uL    Hemoglobin 9.4 (*) 12.0 - 15.0 g/dL    HCT 16.1 (*) 09.6 - 46.0 %    MCV 90.2  78.0 - 100.0 fL    MCH 28.8  26.0 - 34.0 pg    MCHC 32.0  30.0 - 36.0 g/dL    RDW 04.5  40.9 - 81.1 %    Platelets 258  150 - 400 K/uL   VITAMIN B12     Status: Abnormal   Collection Time   06/22/12  4:32 AM      Component Value Range Comment   Vitamin B-12 1620 (*) 211 - 911 pg/mL     Ct Abdomen Pelvis Wo Contrast  06/13/2012  *RADIOLOGY REPORT*  Clinical Data: 2-day history of left flank pain.  Hematuria. Recent diagnosis of urinary tract infection.  CT ABDOMEN AND PELVIS WITHOUT CONTRAST  Technique:  Multidetector CT imaging of the abdomen and pelvis was performed following the standard protocol without intravenous contrast.  Comparison: None.  Findings: No evidence of urinary tract calculi or obstruction on either side.  Cortical calcification cortical calcification involving the anterior aspect of the lower pole left kidney. Approximate 3.3 x 3.0 cm simple cyst arising from the upper pole of the left kidney.  Approximate 4.2 x 2.6 cm cyst arising from the lateral aspect of the lower pole and approximate 1.3 cm diameter cyst arising from the medial aspect of the lower pole left kidney. Approximate 1.6 cm diameter simple cyst arising from the lower pole right kidney.  Within the limits of the unenhanced technique, no solid masses involving either kidney.  Right kidney mildly atrophic, with compensatory enlargement of the left kidney.  Normal unenhanced appearance of the liver, spleen, pancreas, and right adrenal gland.   Approximate 1.6 x 1.2 cm low attenuation nodule involving the left adrenal gland.  Gallbladder unremarkable by CT.  No biliary ductal dilation.  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  No significant lymphadenopathy.  Small hiatal hernia.  Stomach decompressed otherwise unremarkable. Normal-appearing small bowel.  Extensive sigmoid colon diverticulosis without evidence of acute diverticulitis.  Remainder the colon unremarkable.  No ascites.  Urinary bladder decompressed and unremarkable.  Uterus atrophic consistent with age.  No adnexal masses or free pelvic fluid.  Bone window images demonstrate degenerative changes involving the lower thoracic and lumbar spine and both hips.  Visualized lung bases emphysematous but clear.  Heart size upper normal.  IMPRESSION:  1.  No evidence of urinary tract calculi or obstruction on either side. 2.  Multiple bilateral renal cysts.  Mildly atrophic right kidney. 3.  Small left adrenal nodule, statistically consistent with an adenoma. 4.  Sigmoid colon diverticulosis without evidence of acute diverticulitis. 5.  Small hiatal hernia.  Original Report Authenticated By: Arnell Sieving, M.D.   Dg Chest 2 View  06/13/2012  *RADIOLOGY REPORT*  Clinical Data: Cough and fever.  CHEST - 2 VIEW  Comparison: 09/18/2011  Findings: Cardiomegaly and hyperinflation again noted. Mild peribronchial thickening is unchanged. There is no evidence of focal airspace disease, pulmonary edema, suspicious pulmonary nodule/mass, pleural effusion, or pneumothorax. No acute bony abnormalities are identified.  IMPRESSION: No evidence of acute cardiopulmonary disease.  Cardiomegaly with mild chronic peribronchial thickening and hyperinflation.  Original Report Authenticated By: Rosendo Gros, M.D.   Dg Esophagus  06/16/2012  *RADIOLOGY REPORT*  Clinical Data: Esophageal stricture, cough, dysphasia  ESOPHOGRAM/BARIUM SWALLOW  Technique:  Single contrast examination  was  performed using thin barium.  Fluoroscopy time:  1.2 minutes.  Comparison:  None.  Findings:  Marked esophageal dysmotility with numerous tertiary/nonperistaltic contractions and/or diffuse esophageal spasm.  Although suboptimally evaluated, the left lateral aspect of the mid/distal esophagus appears irregular (series 8/image 1), underlying mucosal lesion not excluded.  No definite fixed esophageal narrowing or stricture.  However, the 13 mm barium tablet failed to pass the mid esophagus.  IMPRESSION: Marked esophageal dysmotility with possible diffuse esophageal spasm.  Mucosal irregularity involving the left lateral aspect of the mid/distal esophagus. Endoscopic correlation is suggested to exclude underlying mucosal lesion.  No definite fixed esophageal narrowing or stricture.  However, the 13 mm barium tablet failed to pass the mid esophagus.  Original Report Authenticated By: Charline Bills, M.D.     Disposition: ALF with home health  Diet: cardiac  Activity: as tolerated   Follow-up Appts: Discharge Orders    Future Orders Please Complete By Expires   Diet - low sodium heart healthy      Increase activity slowly      Discharge instructions      Comments:           Time spent on discharge, talking to the patient, and coordinating care: 45 mins.   SignedMarlin Canary, DO 06/23/2012, 2:59 PM

## 2012-06-23 NOTE — Progress Notes (Signed)
Occupational Therapy Treatment Patient Details Name: [name] MRN: [id number] DOB: 18-Jan-1922 Today's Date: 06/23/2012 Time: 7829-5621 OT Time Calculation (min): 13 min   Follow Up Recommendations  Other (comment) (OT at ALF)       Equipment Recommendations  Defer to next venue          Plan Discharge plan remains appropriate    Precautions / Restrictions Precautions Precautions: Fall Restrictions Weight Bearing Restrictions: No       ADL  Grooming: Performed;Wash/dry hands Where Assessed - Grooming: Supported standing Upper Body Bathing: Minimal assistance Toilet Transfer Method: Sit to stand;Other (comment) (walk to bathroom) Toilet Transfer Equipment: Regular height toilet Toileting - Clothing Manipulation and Hygiene: Performed;Minimal assistance Where Assessed - Glass blower/designer Manipulation and Hygiene: Standing Tub/Shower Transfer: Minimal assistance      OT Goals ADL Goals ADL Goal: Grooming - Progress: Progressing toward goals ADL Goal: Toilet Transfer - Progress: Progressing toward goals  Visit Information  Last OT Received On: 06/23/12 Assistance Needed: +1          Cognition  Overall Cognitive Status: Appears within functional limits for tasks assessed/performed Arousal/Alertness: Awake/alert Orientation Level: Appears intact for tasks assessed Behavior During Session: [facility] for tasks performed    Mobility Bed Mobility Bed Mobility: Not assessed (Pt in recliner when PT arrived. ) Transfers Sit to Stand: 4: Min guard;With upper extremity assist;With armrests;From chair/3-in-1 Stand to Sit: 4: Min guard;With upper extremity assist;With armrests;To chair/3-in-1 Details for Transfer Assistance: Min guard for safety with min cues for hand placement and safety.          End of Session OT - End of Session Activity Tolerance: Patient tolerated treatment well Patient left: in bed;with call bell/phone within reach       [facility], Karin Golden  D 06/23/2012, 1:37 PM

## 2012-06-23 NOTE — Progress Notes (Signed)
INITIAL ADULT NUTRITION ASSESSMENT Date: 06/23/2012   Time: 12:12 PM Reason for Assessment: Consult for poor PO   ASSESSMENT: Female 78 y.o.  Dx: ARF (acute renal failure)  Hx:  Past Medical History  Diagnosis Date  . Hypertension   . Peripheral arterial disease   . Cognitive disorder   . Esophageal stricture   . Osteoarthritis   . Anxiety     Related Meds:  Scheduled Meds:   . antiseptic oral rinse  15 mL Mouth Rinse BID  . aspirin  81 mg Oral Daily  . clopidogrel  75 mg Oral Daily  . donepezil  5 mg Oral QHS  . feeding supplement  237 mL Oral BID BM  . Fluticasone-Salmeterol  1 puff Inhalation Q12H  . magic mouthwash w/lidocaine  5 mL Oral TID  . mirtazapine  7.5 mg Oral QHS  . pantoprazole  40 mg Oral Q0600  . rOPINIRole  0.5 mg Oral q1800  . simvastatin  10 mg Oral q1800  . sodium chloride  2 spray Nasal q morning - 10a   Continuous Infusions:  PRN Meds:.acetaminophen, acetaminophen, albuterol, ALPRAZolam, alum & mag hydroxide-simeth, benzocaine, guaiFENesin, HYDROmorphone (DILAUDID) injection, ondansetron (ZOFRAN) IV, ondansetron, oxyCODONE   Ht: 5' (152.4 cm)  Wt: 91 lb 0.8 oz (41.3 kg)  Ideal Wt: 45.5 kg  % Ideal Wt: 91% Wt Readings from Last 10 Encounters:  06/14/12 91 lb 0.8 oz (41.3 kg)  06/14/12 91 lb 0.8 oz (41.3 kg)    Body mass index is 17.78 kg/(m^2). (Underweight)   Food/Nutrition Related Hx: Per RN patient is going home today. Rn reported patient is eating a little better. PO intake documented 25-50% at meals.   Labs:  CMP     Component Value Date/Time   NA 137 06/21/2012 0510   K 4.4 06/21/2012 0510   CL 102 06/21/2012 0510   CO2 30 06/21/2012 0510   GLUCOSE 94 06/21/2012 0510   BUN 20 06/21/2012 0510   CREATININE 1.32* 06/21/2012 0510   CALCIUM 8.6 06/21/2012 0510   PROT 5.8* 06/16/2012 0436   ALBUMIN 2.2* 06/16/2012 0436   AST 27 06/16/2012 0436   ALT 16 06/16/2012 0436   ALKPHOS 92 06/16/2012 0436   BILITOT 0.4 06/16/2012 0436   GFRNONAA  35* 06/21/2012 0510   GFRAA 41* 06/21/2012 0510     Intake/Output Summary (Last 24 hours) at 06/23/12 1213 Last data filed at 06/23/12 0900  Gross per 24 hour  Intake    145 ml  Output    575 ml  Net   -430 ml     Diet Order: Dysphagia  Supplements/Tube Feeding: Ensure BID. Provides 500 kcal and 18 grams of protein.   IVF:    Estimated Nutritional Needs:   Kcal: 4098-1191 Protein: 50-62 grams Fluid: 1 ml per kcal   NUTRITION DIAGNOSIS: -Inadequate oral intake (NI-2.1).  Status: Ongoing  RELATED TO: poor appetite  AS EVIDENCE BY: PO intake documented between 25-50 % of meals.   MONITORING/EVALUATION(Goals): PO intake, weights, labs 1. PO intake > 75% at meals and supplements.  2. Minimize weight loss.   EDUCATION NEEDS: -No education needs identified at this time  INTERVENTION: 1. Continue to provide Ensure BID.  2. Will order patient Ensure pudding 1 a day.  3. RD to follow for nutrition plan of care.   Dietitian [phone]  DOCUMENTATION CODES Per approved criteria  Underweight    Iven Finn [facility] 06/23/2012, 12:12 PM

## 2012-06-23 NOTE — Progress Notes (Signed)
Patient is set to discharge back to [facility] today. Daughter, [name] at bedside and ready to transport her back to facility. [name] @ [facility] aware. Discharge packet provided to daughter.   Unice Bailey, LCSWA [phone]

## 2012-06-23 NOTE — Progress Notes (Signed)
Physical Therapy Treatment Patient Details Name: [name] MRN: [id number] DOB: Nov 19, 1922 Today's Date: 06/23/2012 Time: 7829-5621 PT Time Calculation (min): 18 min  PT Assessment / Plan / Recommendation Comments on Treatment Session  Pt progressing well with transfers and ambulation.  Pt very motivated to return to PLOF and walk as much as possible.  Pt expected to D/C today to ALF    Follow Up Recommendations  Home health PT;Supervision for mobility/OOB;Skilled nursing facility    Barriers to Discharge        Equipment Recommendations  None recommended by OT    Recommendations for Other Services    Frequency Min 3X/week   Plan Discharge plan remains appropriate    Precautions / Restrictions Precautions Precautions: Fall Restrictions Weight Bearing Restrictions: No   Pertinent Vitals/Pain No pain and states that mouth is doing much better    Mobility  Bed Mobility Bed Mobility: Not assessed (Pt in recliner when PT arrived. ) Transfers Transfers: Sit to Stand;Stand to Sit Sit to Stand: 4: Min guard;With upper extremity assist;With armrests;From chair/3-in-1 Stand to Sit: 4: Min guard;With upper extremity assist;With armrests;To chair/3-in-1 Details for Transfer Assistance: Min guard for safety with min cues for hand placement and safety.  Ambulation/Gait Ambulation/Gait Assistance: 4: Min guard Ambulation Distance (Feet): 400 Feet (took 2 short standing rest breaks in between) Assistive device: Rolling walker Ambulation/Gait Assistance Details: Min cues for safety and position inside RW.  Gait Pattern: Step-through pattern;Decreased stride length;Decreased step length - right;Decreased step length - left    Exercises     PT Diagnosis:    PT Problem List:   PT Treatment Interventions:     PT Goals Acute Rehab PT Goals PT Goal Formulation: With patient Time For Goal Achievement: 06/26/12 Potential to Achieve Goals: Good Pt will go Sit to Stand: with  supervision PT Goal: Sit to Stand - Progress: Progressing toward goal Pt will Ambulate: 51 - 150 feet;with supervision;with least restrictive assistive device PT Goal: Ambulate - Progress: Progressing toward goal  Visit Information  Last PT Received On: 06/23/12 Assistance Needed: +1    Subjective Data  Subjective: I want to walk as much as I can Patient Stated Goal: Get stronger   Cognition  Overall Cognitive Status: Appears within functional limits for tasks assessed/performed Arousal/Alertness: Awake/alert Orientation Level: Appears intact for tasks assessed Behavior During Session: [name] for tasks performed    Balance     End of Session PT - End of Session Activity Tolerance: Patient tolerated treatment well Patient left: in chair;with call bell/phone within reach Nurse Communication: Mobility status   GP     Page, [name] 06/23/2012, 10:45 AM

## 2012-10-16 ENCOUNTER — Emergency Department (HOSPITAL_COMMUNITY): Payer: Medicare Other

## 2012-10-16 ENCOUNTER — Inpatient Hospital Stay (HOSPITAL_COMMUNITY)
Admission: EM | Admit: 2012-10-16 | Discharge: 2012-10-24 | DRG: 177 | Disposition: A | Discharge status: Skilled Nursing Facility | Payer: Medicare Other | Attending: Internal Medicine | Admitting: Internal Medicine

## 2012-10-16 ENCOUNTER — Other Ambulatory Visit: Payer: Self-pay

## 2012-10-16 ENCOUNTER — Encounter (HOSPITAL_COMMUNITY): Payer: Self-pay | Admitting: Emergency Medicine

## 2012-10-16 ENCOUNTER — Inpatient Hospital Stay (HOSPITAL_COMMUNITY): Payer: Medicare Other

## 2012-10-16 DIAGNOSIS — N390 Urinary tract infection, site not specified: Secondary | ICD-10-CM

## 2012-10-16 DIAGNOSIS — I5042 Chronic combined systolic (congestive) and diastolic (congestive) heart failure: Secondary | ICD-10-CM | POA: Diagnosis present

## 2012-10-16 DIAGNOSIS — B370 Candidal stomatitis: Secondary | ICD-10-CM | POA: Diagnosis not present

## 2012-10-16 DIAGNOSIS — F0390 Unspecified dementia without behavioral disturbance: Secondary | ICD-10-CM | POA: Diagnosis present

## 2012-10-16 DIAGNOSIS — I509 Heart failure, unspecified: Secondary | ICD-10-CM | POA: Diagnosis present

## 2012-10-16 DIAGNOSIS — E875 Hyperkalemia: Secondary | ICD-10-CM | POA: Diagnosis not present

## 2012-10-16 DIAGNOSIS — E876 Hypokalemia: Secondary | ICD-10-CM

## 2012-10-16 DIAGNOSIS — N179 Acute kidney failure, unspecified: Secondary | ICD-10-CM

## 2012-10-16 DIAGNOSIS — Z7902 Long term (current) use of antithrombotics/antiplatelets: Secondary | ICD-10-CM

## 2012-10-16 DIAGNOSIS — I129 Hypertensive chronic kidney disease with stage 1 through stage 4 chronic kidney disease, or unspecified chronic kidney disease: Secondary | ICD-10-CM | POA: Diagnosis present

## 2012-10-16 DIAGNOSIS — I214 Non-ST elevation (NSTEMI) myocardial infarction: Secondary | ICD-10-CM | POA: Diagnosis present

## 2012-10-16 DIAGNOSIS — J189 Pneumonia, unspecified organism: Secondary | ICD-10-CM

## 2012-10-16 DIAGNOSIS — I10 Essential (primary) hypertension: Secondary | ICD-10-CM

## 2012-10-16 DIAGNOSIS — E872 Acidosis, unspecified: Secondary | ICD-10-CM | POA: Diagnosis present

## 2012-10-16 DIAGNOSIS — I739 Peripheral vascular disease, unspecified: Secondary | ICD-10-CM | POA: Diagnosis present

## 2012-10-16 DIAGNOSIS — J4489 Other specified chronic obstructive pulmonary disease: Secondary | ICD-10-CM | POA: Diagnosis present

## 2012-10-16 DIAGNOSIS — F411 Generalized anxiety disorder: Secondary | ICD-10-CM | POA: Diagnosis present

## 2012-10-16 DIAGNOSIS — E871 Hypo-osmolality and hyponatremia: Secondary | ICD-10-CM

## 2012-10-16 DIAGNOSIS — Z7982 Long term (current) use of aspirin: Secondary | ICD-10-CM

## 2012-10-16 DIAGNOSIS — M19019 Primary osteoarthritis, unspecified shoulder: Secondary | ICD-10-CM | POA: Diagnosis present

## 2012-10-16 DIAGNOSIS — K589 Irritable bowel syndrome without diarrhea: Secondary | ICD-10-CM | POA: Diagnosis present

## 2012-10-16 DIAGNOSIS — L8991 Pressure ulcer of unspecified site, stage 1: Secondary | ICD-10-CM | POA: Diagnosis not present

## 2012-10-16 DIAGNOSIS — Z79899 Other long term (current) drug therapy: Secondary | ICD-10-CM

## 2012-10-16 DIAGNOSIS — F419 Anxiety disorder, unspecified: Secondary | ICD-10-CM

## 2012-10-16 DIAGNOSIS — J9600 Acute respiratory failure, unspecified whether with hypoxia or hypercapnia: Secondary | ICD-10-CM | POA: Diagnosis present

## 2012-10-16 DIAGNOSIS — M1990 Unspecified osteoarthritis, unspecified site: Secondary | ICD-10-CM | POA: Diagnosis present

## 2012-10-16 DIAGNOSIS — J9601 Acute respiratory failure with hypoxia: Secondary | ICD-10-CM

## 2012-10-16 DIAGNOSIS — Z87891 Personal history of nicotine dependence: Secondary | ICD-10-CM

## 2012-10-16 DIAGNOSIS — D696 Thrombocytopenia, unspecified: Secondary | ICD-10-CM

## 2012-10-16 DIAGNOSIS — J9611 Chronic respiratory failure with hypoxia: Secondary | ICD-10-CM | POA: Diagnosis present

## 2012-10-16 DIAGNOSIS — Z66 Do not resuscitate: Secondary | ICD-10-CM | POA: Diagnosis present

## 2012-10-16 DIAGNOSIS — J852 Abscess of lung without pneumonia: Principal | ICD-10-CM | POA: Diagnosis present

## 2012-10-16 DIAGNOSIS — N183 Chronic kidney disease, stage 3 unspecified: Secondary | ICD-10-CM | POA: Diagnosis present

## 2012-10-16 DIAGNOSIS — J449 Chronic obstructive pulmonary disease, unspecified: Secondary | ICD-10-CM

## 2012-10-16 DIAGNOSIS — K222 Esophageal obstruction: Secondary | ICD-10-CM

## 2012-10-16 DIAGNOSIS — R0603 Acute respiratory distress: Secondary | ICD-10-CM

## 2012-10-16 DIAGNOSIS — L89109 Pressure ulcer of unspecified part of back, unspecified stage: Secondary | ICD-10-CM | POA: Diagnosis not present

## 2012-10-16 DIAGNOSIS — D72829 Elevated white blood cell count, unspecified: Secondary | ICD-10-CM

## 2012-10-16 DIAGNOSIS — I5043 Acute on chronic combined systolic (congestive) and diastolic (congestive) heart failure: Secondary | ICD-10-CM | POA: Diagnosis present

## 2012-10-16 DIAGNOSIS — R042 Hemoptysis: Secondary | ICD-10-CM | POA: Diagnosis present

## 2012-10-16 LAB — URINALYSIS, ROUTINE W REFLEX MICROSCOPIC
Bilirubin Urine: NEGATIVE
Glucose, UA: NEGATIVE mg/dL
Ketones, ur: NEGATIVE mg/dL
Leukocytes, UA: NEGATIVE
Nitrite: NEGATIVE
Protein, ur: 30 mg/dL — AB
pH: 5 (ref 5.0–8.0)

## 2012-10-16 LAB — URINE MICROSCOPIC-ADD ON

## 2012-10-16 LAB — CK TOTAL AND CKMB (NOT AT ARMC)
CK, MB: 9 ng/mL (ref 0.3–4.0)
CK, MB: 8.8 ng/mL (ref 0.3–4.0)
Relative Index: INVALID (ref 0.0–2.5)
Relative Index: INVALID (ref 0.0–2.5)
Relative Index: 7.5 — ABNORMAL HIGH (ref 0.0–2.5)
Total CK: 117 U/L (ref 7–177)

## 2012-10-16 LAB — CBC
MCHC: 33.1 g/dL (ref 30.0–36.0)
MCV: 88.8 fL (ref 78.0–100.0)
Platelets: 299 10*3/uL (ref 150–400)
RDW: 13.2 % (ref 11.5–15.5)
WBC: 16.3 10*3/uL — ABNORMAL HIGH (ref 4.0–10.5)

## 2012-10-16 LAB — COMPREHENSIVE METABOLIC PANEL
AST: 24 U/L (ref 0–37)
Albumin: 3.3 g/dL — ABNORMAL LOW (ref 3.5–5.2)
Alkaline Phosphatase: 86 U/L (ref 39–117)
Calcium: 9.2 mg/dL (ref 8.4–10.5)
Chloride: 102 mEq/L (ref 96–112)
Creatinine, Ser: 1.38 mg/dL — ABNORMAL HIGH (ref 0.50–1.10)
GFR calc Af Amer: 38 mL/min — ABNORMAL LOW (ref >90)
GFR calc non Af Amer: 33 mL/min — ABNORMAL LOW (ref >90)
Total Protein: 7.2 g/dL (ref 6.0–8.3)

## 2012-10-16 LAB — POCT I-STAT 3, ART BLOOD GAS (G3+)
Acid-base deficit: 5 mmol/L — ABNORMAL HIGH (ref 0.0–2.0)
Bicarbonate: 22.4 mEq/L (ref 20.0–24.0)
O2 Saturation: 99 %
TCO2: 24 mmol/L (ref 0–100)
pO2, Arterial: 174 mmHg — ABNORMAL HIGH (ref 80.0–100.0)

## 2012-10-16 LAB — MRSA PCR SCREENING: MRSA by PCR: NEGATIVE

## 2012-10-16 LAB — STREP PNEUMONIAE URINARY ANTIGEN: Strep Pneumo Urinary Antigen: NEGATIVE

## 2012-10-16 MED ORDER — ONDANSETRON HCL 4 MG/2ML IJ SOLN
INTRAMUSCULAR | Status: AC
  Filled 2012-10-16: qty 2

## 2012-10-16 MED ORDER — SIMVASTATIN 10 MG PO TABS
10.0000 mg | ORAL_TABLET | Freq: Every day | ORAL | Status: DC
  Administered 2012-10-17 – 2012-10-23 (×5): 10 mg via ORAL
  Filled 2012-10-16 (×9): qty 1

## 2012-10-16 MED ORDER — ALBUTEROL (5 MG/ML) CONTINUOUS INHALATION SOLN
10.0000 mg | INHALATION_SOLUTION | RESPIRATORY_TRACT | Status: DC
  Administered 2012-10-16: 10 mg via RESPIRATORY_TRACT
  Filled 2012-10-16: qty 20

## 2012-10-16 MED ORDER — KETOROLAC TROMETHAMINE 15 MG/ML IJ SOLN
15.0000 mg | Freq: Once | INTRAMUSCULAR | Status: AC
  Administered 2012-10-16: 15 mg via INTRAVENOUS
  Filled 2012-10-16: qty 1

## 2012-10-16 MED ORDER — ALPRAZOLAM 0.25 MG PO TABS
0.2500 mg | ORAL_TABLET | Freq: Two times a day (BID) | ORAL | Status: DC | PRN
  Administered 2012-10-17 – 2012-10-21 (×5): 0.25 mg via ORAL
  Filled 2012-10-16 (×6): qty 1

## 2012-10-16 MED ORDER — MONTELUKAST SODIUM 10 MG PO TABS
10.0000 mg | ORAL_TABLET | Freq: Every morning | ORAL | Status: DC
  Administered 2012-10-17 – 2012-10-24 (×8): 10 mg via ORAL
  Filled 2012-10-16 (×9): qty 1

## 2012-10-16 MED ORDER — ONDANSETRON HCL 4 MG/2ML IJ SOLN
4.0000 mg | Freq: Once | INTRAMUSCULAR | Status: AC
  Administered 2012-10-16: 4 mg via INTRAVENOUS

## 2012-10-16 MED ORDER — IMIPENEM-CILASTATIN 250 MG IV SOLR
250.0000 mg | Freq: Two times a day (BID) | INTRAVENOUS | Status: DC
  Administered 2012-10-16 – 2012-10-22 (×12): 250 mg via INTRAVENOUS
  Filled 2012-10-16 (×14): qty 250

## 2012-10-16 MED ORDER — VITAMIN D3 25 MCG (1000 UNIT) PO TABS
1000.0000 [IU] | ORAL_TABLET | Freq: Every day | ORAL | Status: DC
  Administered 2012-10-17 – 2012-10-24 (×8): 1000 [IU] via ORAL
  Filled 2012-10-16 (×9): qty 1

## 2012-10-16 MED ORDER — LEVALBUTEROL HCL 0.63 MG/3ML IN NEBU
0.6300 mg | INHALATION_SOLUTION | Freq: Four times a day (QID) | RESPIRATORY_TRACT | Status: DC
  Administered 2012-10-16 – 2012-10-17 (×6): 0.63 mg via RESPIRATORY_TRACT
  Filled 2012-10-16 (×8): qty 3

## 2012-10-16 MED ORDER — SODIUM CHLORIDE 0.9 % IV SOLN
500.0000 mg | INTRAVENOUS | Status: DC
  Administered 2012-10-18 – 2012-10-22 (×3): 500 mg via INTRAVENOUS
  Filled 2012-10-16 (×3): qty 500

## 2012-10-16 MED ORDER — LEVOFLOXACIN IN D5W 750 MG/150ML IV SOLN: 750.0000 mg | INTRAVENOUS | Status: DC

## 2012-10-16 MED ORDER — LEVOFLOXACIN IN D5W 750 MG/150ML IV SOLN
750.0000 mg | INTRAVENOUS | Status: DC
  Administered 2012-10-16: 750 mg via INTRAVENOUS
  Filled 2012-10-16: qty 150

## 2012-10-16 MED ORDER — SALINE SPRAY 0.65 % NA SOLN
2.0000 | Freq: Every morning | NASAL | Status: DC
  Administered 2012-10-17 – 2012-10-24 (×9): 2 via NASAL
  Filled 2012-10-16: qty 44

## 2012-10-16 MED ORDER — IPRATROPIUM BROMIDE 0.02 % IN SOLN
1.0000 mg | Freq: Once | RESPIRATORY_TRACT | Status: AC
  Administered 2012-10-16: 1 mg via RESPIRATORY_TRACT
  Filled 2012-10-16: qty 5

## 2012-10-16 MED ORDER — VITAMIN D 1000 UNITS PO CAPS: 1000.0000 [IU] | ORAL_CAPSULE | Freq: Every day | ORAL | Status: DC

## 2012-10-16 MED ORDER — ENSURE COMPLETE PO LIQD
237.0000 mL | Freq: Two times a day (BID) | ORAL | Status: DC
  Administered 2012-10-17 – 2012-10-24 (×13): 237 mL via ORAL

## 2012-10-16 MED ORDER — VANCOMYCIN HCL IN DEXTROSE 1-5 GM/200ML-% IV SOLN
1000.0000 mg | Freq: Once | INTRAVENOUS | Status: AC
  Administered 2012-10-16: 1000 mg via INTRAVENOUS
  Filled 2012-10-16: qty 200

## 2012-10-16 MED ORDER — FLUTICASONE-SALMETEROL 100-50 MCG/DOSE IN AEPB
1.0000 | INHALATION_SPRAY | Freq: Two times a day (BID) | RESPIRATORY_TRACT | Status: DC
  Administered 2012-10-16 – 2012-10-23 (×14): 1 via RESPIRATORY_TRACT
  Filled 2012-10-16 (×2): qty 14

## 2012-10-16 MED ORDER — FAMOTIDINE 20 MG PO TABS
20.0000 mg | ORAL_TABLET | Freq: Two times a day (BID) | ORAL | Status: DC
  Administered 2012-10-16 – 2012-10-19 (×6): 20 mg via ORAL
  Filled 2012-10-16 (×7): qty 1

## 2012-10-16 MED ORDER — CARBOXYMETHYLCELLUL-GLYCERIN 0.5-0.9 % OP SOLN: 1.0000 [drp] | Freq: Four times a day (QID) | OPHTHALMIC | Status: DC | PRN

## 2012-10-16 MED ORDER — IOHEXOL 350 MG/ML SOLN
80.0000 mL | Freq: Once | INTRAVENOUS | Status: AC | PRN
  Administered 2012-10-16: 80 mL via INTRAVENOUS

## 2012-10-16 MED ORDER — MIRTAZAPINE 7.5 MG PO TABS
7.5000 mg | ORAL_TABLET | Freq: Every day | ORAL | Status: DC
  Administered 2012-10-16 – 2012-10-23 (×8): 7.5 mg via ORAL
  Filled 2012-10-16 (×9): qty 1

## 2012-10-16 MED ORDER — POLYVINYL ALCOHOL 1.4 % OP SOLN
1.0000 [drp] | Freq: Four times a day (QID) | OPHTHALMIC | Status: DC | PRN
  Filled 2012-10-16: qty 15

## 2012-10-16 MED ORDER — IPRATROPIUM BROMIDE 0.02 % IN SOLN
0.5000 mg | Freq: Four times a day (QID) | RESPIRATORY_TRACT | Status: DC
  Administered 2012-10-16 – 2012-10-17 (×6): 0.5 mg via RESPIRATORY_TRACT
  Filled 2012-10-16 (×6): qty 2.5

## 2012-10-16 MED ORDER — DONEPEZIL HCL 5 MG PO TABS
5.0000 mg | ORAL_TABLET | Freq: Every day | ORAL | Status: DC
  Administered 2012-10-16: 22:00:00 via ORAL
  Administered 2012-10-17 – 2012-10-23 (×7): 5 mg via ORAL
  Filled 2012-10-16 (×9): qty 1

## 2012-10-16 MED ORDER — LEVOFLOXACIN IN D5W 500 MG/100ML IV SOLN: 500.0000 mg | INTRAVENOUS | Status: DC

## 2012-10-16 MED ORDER — DEXTROSE-NACL 5-0.45 % IV SOLN
INTRAVENOUS | Status: DC
  Administered 2012-10-16 – 2012-10-18 (×4): via INTRAVENOUS

## 2012-10-16 NOTE — Progress Notes (Signed)
CRITICAL VALUE ALERT  Critical value received:  CKMB 9.0 and troponin 1.62  Date of notification:  10/16/2012  Time of notification:  1700   Critical value read back:yes  Nurse who received alert:  Jane Canary, RN   MD notified (1st page):  Dr. Isidoro Donning  Time of first page:  17:05  MD notified (2nd page):  Time of second page:  Responding MD:  Dr. Isidoro Donning    Time MD responded:  17:08

## 2012-10-16 NOTE — Progress Notes (Addendum)
ANTIBIOTIC CONSULT NOTE - INITIAL  Pharmacy Consult for Vancomycin Indication: rule out pneumonia  Allergies  Allergen Reactions  . Altace (Ramipril) Other (See Comments)    Reaction unknown  . Ambien (Zolpidem Tartrate) Other (See Comments)    Reaction unknown  . Codeine Other (See Comments)    Reaction unknown  . Demerol (Meperidine) Other (See Comments)    Reaction unknown  . Penicillins Other (See Comments)    Reaction unknown  . Sulfa Antibiotics Other (See Comments)    Reaction unknown  . Versed (Midazolam) Other (See Comments)    Reaction unknown   Patient Measurements: Weight 41.3 kg on 06/14/12 Height 152 cm on 06/14/12 Vital Signs: Temp: 101.5 F (38.6 C) (10/24 1001) Temp src: Rectal (10/24 1001) BP: 93/60 mmHg (10/24 1008) Pulse Rate: 109  (10/24 0824) Intake/Output from previous day:   Intake/Output from this shift: Total I/O In: -  Out: 1 [Stool:1]  Labs:  [facility] 10/16/12 0835  WBC 16.3*  HGB 15.5*  PLT 299  LABCREA --  CREATININE 1.38*   The CrCl is unknown because both a height and weight (above a minimum accepted value) are required for this calculation. No results found for this basename: VANCOTROUGH:2,VANCOPEAK:2,VANCORANDOM:2,GENTTROUGH:2,GENTPEAK:2,GENTRANDOM:2,TOBRATROUGH:2,TOBRAPEAK:2,TOBRARND:2,AMIKACINPEAK:2,AMIKACINTROU:2,AMIKACIN:2, in the last 72 hours   Microbiology: No results found for this or any previous visit (from the past 720 hour(s)).  Medical History: Past Medical History  Diagnosis Date  . Hypertension   . Peripheral arterial disease   . Cognitive disorder   . Esophageal stricture   . Osteoarthritis   . Anxiety   . UTI (lower urinary tract infection) June 2013  . COPD (chronic obstructive pulmonary disease)    Medications:  Anti-infectives     Start     Dose/Rate Route Frequency Ordered Stop   10/16/12 1345   levofloxacin (LEVAQUIN) IVPB 750 mg  Status:  Discontinued        750 mg 100 mL/hr over 90 Minutes  Intravenous Every 24 hours 10/16/12 1344 10/16/12 1348   10/16/12 1015   vancomycin (VANCOCIN) IVPB 1000 mg/200 mL premix        1,000 mg 200 mL/hr over 60 Minutes Intravenous  Once 10/16/12 1006     10/16/12 0930   levofloxacin (LEVAQUIN) IVPB 750 mg        750 mg 100 mL/hr over 90 Minutes Intravenous Every 24 hours 10/16/12 0925           Assessment: 38 YOF with recent hospital admission in June admitted 10/16/12 with worsening dyspnea on exertion, chills but no fever and was hypoxic in ED to start IV vancomycin and levofloxacin. Vancomycin 1 gram was ordered in ED but has not been given. Called RN- will give STAT.  Levaquin 750mg  was given at 10AM. WBC up at 16.3. Afebrile. SCr 1.38/estCrCl~18.50mL/min.  Cultures pending:  10/24 Blood 10/24 Sputum 10/24 Urine 10/24 Influenza antigen, Urinary strep antigen, Legionella antigen  Goal of Therapy:  Vancomycin trough level 15-20 mcg/ml  Plan:  1. Continue with Vancomycin 1 gram stat for loading dose. 2. Vancomycin 500mg  IV q48h.  3. Adjust Levaquin to 500mg  IV q48hrs. 4. Follow up cultures and sensitivities, clinical status, and renal function.   Link Snuffer, PharmD, BCPS Clinical Pharmacist [phone] 10/16/2012,1:56 PM   PM: Adding Primaxin 250 mg iv Q 12 hours  Thank you. Okey Regal, PharmD

## 2012-10-16 NOTE — Progress Notes (Signed)
Received patient from ED, patient placed on bi-pap but not tolerating it well, MD notified and orders received to wean off O2 and patient placed on 50 %venti mask.  VVS and patient is alert and oriented.  Patient and family orientied to unit and routines.

## 2012-10-16 NOTE — H&P (Signed)
History and Physical       Hospital Admission Note Date: 10/16/2012  Patient name: [name] Medical record number: [id number] Date of birth: May 30, 1922 Age: 78 y.o. Gender: female PCP: [name], MD    Chief Complaint:  Sent from skilled nursing facility for Acute respiratory distress  HPI: Patient is a 78 year old female with history of peripheral arterial disease, hypertension, osteoarthritis, history of a esophageal stricture status post dilatation in June 2013 was sent from skilled nursing facility for acute respiratory distress today. At the time of my evaluation patient was on BiPAP, daughter present in the room and was able to converse. Per the daughter, she saw the patient yesterday and was "fine". Patient states that she was having coughing and worsening shortness of breath since last night. She then developed hemoptysis with more than 4 or 5 episodes today morning with coughing. Patient reported that she noticed "fresh blood" with coughing. Patient was placed on CPAP via EMS. She also reported some left-sided chest pain posteriorly worse with coughing. At the time of arrival, O2 sats were in the 70s on room air and respiratory rate in 50s to 60s. Patient's daughter reported that she has been having dyspnea on exertion, progressively worsening over last 3-4 months.   Review of Systems: Was difficult to obtain as the patient was on BiPAP Constitutional: Patient denied fever, chills HEENT: patient denied any sore throat or cold like symptoms or choking. Difficult to obtain due to being on BiPAP Respiratory: Please see HPI.   Cardiovascular: Denied chest pain, palpitations and leg swelling.  Gastrointestinal: Denied nausea, vomiting, abdominal pain, blood in stool  Genitourinary: Denied any difficulty urinating however difficult to obtain complete ROS Musculoskeletal: Denies any acute pain. Ambulates with a walker  Skin:  Denies pallor, rash and wound.  Neurological: Denies any dizziness or syncope. Hematological: Difficult to obtain, she denied any prior history of hemoptysis.  Psychiatric/Behavioral: Difficult to obtain but patient appeared to be alert and oriented  Past Medical History: Past Medical History  Diagnosis Date  . Hypertension   . Peripheral arterial disease   . Cognitive disorder   . Esophageal stricture   . Osteoarthritis   . Anxiety    Past Surgical History  Procedure Date  . Femoral bypass   . Esophagogastroduodenoscopy 06/21/2012    Procedure: ESOPHAGOGASTRODUODENOSCOPY (EGD);  Surgeon: Theda Belfast, MD;  Location: [facility];  Service: Endoscopy;  Laterality: N/A;    Medications: Prior to Admission medications   Medication Sig Start Date End Date Taking? Authorizing Provider  acetaminophen (MAPAP) 325 MG tablet Take 650 mg by mouth every 6 (six) hours as needed. For pain   Yes Historical Provider, MD  albuterol (PROVENTIL HFA;VENTOLIN HFA) 108 (90 BASE) MCG/ACT inhaler Inhale 2 puffs into the lungs every 4 (four) hours as needed. Wheezing/shortness of breath.   Yes Historical Provider, MD  ALPRAZolam (XANAX) 0.25 MG tablet Take 0.25 mg by mouth 2 (two) times daily as needed. For anxiety   Yes Historical Provider, MD  aspirin 81 MG chewable tablet Chew 81 mg by mouth daily.   Yes Historical Provider, MD  benzocaine (ORABASE-B) 20 % PSTE Use as directed 1 application in the mouth or throat 4 (four) times daily as needed. For pain or discomfort 06/23/12  Yes Joseph Art, DO  Carboxymethylcellul-Glycerin 0.5-0.9 % SOLN Place 1 drop into both eyes 4 (four) times daily as needed. For dry eyes   Yes Historical Provider, MD  Cholecalciferol (VITAMIN D) 1000 UNITS capsule Take  1,000 Units by mouth daily.   Yes Historical Provider, MD  clopidogrel (PLAVIX) 75 MG tablet Take 75 mg by mouth daily.   Yes Historical Provider, MD  donepezil (ARICEPT) 5 MG tablet Take 5 mg by mouth at  bedtime.   Yes Historical Provider, MD  feeding supplement (ENSURE COMPLETE) LIQD Take 237 mLs by mouth 2 (two) times daily between meals. 06/23/12  Yes Joseph Art, DO  Fluticasone-Salmeterol (ADVAIR) 100-50 MCG/DOSE AEPB Inhale 1 puff into the lungs every 12 (twelve) hours.   Yes Historical Provider, MD  Melatonin 1 MG TABS Take 1 mg by mouth at bedtime.   Yes Historical Provider, MD  metoprolol tartrate (LOPRESSOR) 25 MG tablet Take 25 mg by mouth 2 (two) times daily.   Yes Historical Provider, MD  mirtazapine (REMERON) 15 MG tablet Take 7.5 mg by mouth at bedtime.   Yes Historical Provider, MD  montelukast (SINGULAIR) 10 MG tablet Take 10 mg by mouth every morning.   Yes Historical Provider, MD  pravastatin (PRAVACHOL) 20 MG tablet Take 20 mg by mouth daily.   Yes Historical Provider, MD  promethazine (PHENERGAN) 12.5 MG tablet Take 12.5 mg by mouth every 6 (six) hours as needed. For nauesea   Yes Historical Provider, MD  ranitidine (ZANTAC) 300 MG tablet Take 300 mg by mouth 2 (two) times daily.   Yes Historical Provider, MD  sodium chloride (OCEAN) 0.65 % SOLN nasal spray Place 2 sprays into the nose every morning. May keep at bedside.   Yes Historical Provider, MD    Allergies:   Allergies  Allergen Reactions  . Altace (Ramipril) Other (See Comments)    Reaction unknown  . Ambien (Zolpidem Tartrate) Other (See Comments)    Reaction unknown  . Codeine Other (See Comments)    Reaction unknown  . Demerol (Meperidine) Other (See Comments)    Reaction unknown  . Penicillins Other (See Comments)    Reaction unknown  . Sulfa Antibiotics Other (See Comments)    Reaction unknown  . Versed (Midazolam) Other (See Comments)    Reaction unknown    Social History:  reports that she quit smoking about 6 months ago. She does not have any smokeless tobacco history on file. She reports that she does not drink alcohol or use illicit drugs. Patient's daughter says she may have smoked  atleast  one pack per day prior to quitting. Patient is a resident of [facility] skilled nursing facility, ambulates with a walker  Family History: Family History  Problem Relation Age of Onset  . Coronary artery disease Father   . Coronary artery disease Mother   . Hypertension Mother   . Breast cancer Maternal Grandmother   . Breast cancer Maternal Aunt     Physical Exam: Blood pressure 93/60, pulse 109, temperature 101.5 F (38.6 C), temperature source Rectal, resp. rate 18, SpO2 99.00%. General: Alert, awake, oriented, in resp distress, on BIPAP. HEENT: normocephalic, atraumatic, anicteric sclera, pink conjunctiva, pupils equal and reactive to light and accomodation Neck: supple, no masses or lymphadenopathy  Heart: Distant heart sounds, tachycardia, regular . Lungs: Tachypnea with bilateral rhonchi and bibasilar Rales Abdomen: Soft, nontender, nondistended, positive bowel sounds, no masses. Extremities: No clubbing, cyanosis or edema with positive pedal pulses. Neuro: no obvious focal neurological deficits, moving all 4 extremities bilaterally Psych: alert and oriented, normal mood and affect Skin: no rashes or lesions, warm and dry   LABS on Admission:  Basic Metabolic Panel:  Lab 10/16/12 1478  NA 137  K 4.7  CL 102  CO2 23  GLUCOSE 199*  BUN 21  CREATININE 1.38*  CALCIUM 9.2  MG --  PHOS --   Liver Function Tests:  Lab 10/16/12 0835  AST 24  ALT 11  ALKPHOS 86  BILITOT 0.3  PROT 7.2  ALBUMIN 3.3*   CBC:  Lab 10/16/12 0835  WBC 16.3*  NEUTROABS --  HGB 15.5*  HCT 46.8*  MCV 88.8  PLT 299   Cardiac Enzymes:  Lab 10/16/12 0836  CKTOTAL --  CKMB --  CKMBINDEX --  TROPONINI 0.31*     Radiological Exams on Admission: Dg Chest Portable 1 View  10/16/2012  *RADIOLOGY REPORT*  Clinical Data: Shortness of breath, respiratory distress  PORTABLE CHEST - 1 VIEW  Comparison: Portable exam 0857 hours compared to 06/13/2012  Findings: Minimal  enlargement of cardiac silhouette. Mediastinal contours normal. Extensive airspace infiltrate identified in lingula and left lower lobe consistent with pneumonia. Aspiration not entirely excluded. Underlying emphysematous changes. Probable mild infiltrate at right base as well. No gross pleural effusion or pneumothorax. Diffuse osseous demineralization.  IMPRESSION: Extensive airspace consolidation involving the lingula and left lower lobe consistent with pneumonia though aspiration not excluded. Minimal similar right basilar infiltrate.   Original Report Authenticated By: Lollie Marrow, M.D.     Assessment/Plan Principal Problem:  *Acute hypoxic respiratory failure: possibly secondary to multifocal pneumonia, rule out pulmonary embolism with sudden shortness of breath, hypoxia/ hemoptysis, rule out MI, possible acute CHF   - Admit to step down unit, currently on BiPAP and tolerating it, will repeat ABG in 2 hours on BiPAP. Wean O2 as tolerated. - will obtain CT angiogram of the chest once more hemodynamically stable.  - Obtain blood cultures, sputum cultures, urine Legionella antigen, urine strep antigen - Placed on vancomycin IV and levofloxacin for HCAP, patient has penicillin allergy -  N.p.o. until  BiPAP off and swallow studies available. Patient has a history of esophageal stricture requiring periodic dilatation. - Pulmonology consult obtained, discussed with Dr. Colletta Maryland, will await recommendations.    Active Problems:   Healthcare-associated pneumonia: As in #1   Elevated troponin with elevated BNP: possible NSTEMI  vs demand ischemia due to ac respiratory distress, acute CHF. EKG shows ?diffuse ST elevation, patient is asymptomatic - Will obtain 2D ECHO for further work-up, serial cardiac enzymes, called cardiology consult.  - She is on ASA and plavix, but unfortunately currently having hemoptysis, hence holding. BP is borderline in low 90's, will not be able to tolerate BB. On statin.      Hemoptysis: Possibly from tracheobronchitis/pneumonia however rule out PE or malignancy given her prior history of nicotine abuse - Will obtain CT angiogram of the chest once stable, hold aspirin and Plavix, continue IV antibiotics - Await Pulmonology recommendations    Peripheral arterial disease: stable, hold ASA/plavix  DVT prophylaxis: SCD's  CODE STATUS: DO NOT RESUSCITATE   Family Communication: Daughter, [name], phone # [phone]  Further plan will depend as patient's clinical course evolves and further radiologic and laboratory data become available.   Time Spent on Admission: (critical care)  RAI,RIPUDEEP M.D. [facility] 10/16/2012, 11:14 AM Pager: [phone]  If 7PM-7AM, please contact night-coverage [email or web address] Password [email or web address]

## 2012-10-16 NOTE — ED Notes (Signed)
Handoff report given to [facility] RN on 3300. Waiting for room to be cleaned so pt can be transported to floor.

## 2012-10-16 NOTE — Consult Note (Signed)
CARDIOLOGY CONSULT NOTE   Patient ID: [name] MRN: [id number] DOB/AGE: 05/31/1922 78 y.o.  Admit date: 10/16/2012  Primary Physician   [name], MD Primary Cardiologist   Dr Judithe Modest - in [place], [place] Reason for Consultation   Elevated troponin, ? CHF  [name] is a 78 y.o. female with no history of CAD. Since D/C from the hospital in June 2013, she has had problems with DOE. She was a little weak at first, but improved. However, she has had worsening DOE, recently her daughter noted problems with conversation. Pt denies PND/orthopnea. Pt woke up this am with SOB and weakness, presyncope, and experienced an episode of what is described as hemoptysis. Pt coughs modestly but chronically with production of only a small amount of benign appearing sputum.  No fevers PTA but has had chills. Routine visit by NP yesterday, reportedly had clear lungs. Dyspnea makes pt very anxious, but she appears calm now during treatment with a BiPAP mask.  In the ER, pt was hypoxic, placed on BIPAP and her sats have improved. Her BP has been low but she is asymptomatic from this (at rest on the BIPAP). She feels better than on admission.  Past Medical History  Diagnosis Date  . Hypertension   . Peripheral arterial disease   . Cognitive disorder   . Esophageal stricture   . Osteoarthritis   . Anxiety   . UTI (lower urinary tract infection) June 2013  . COPD (chronic obstructive pulmonary disease)     Past Surgical History  Procedure Date  . Femoral bypass   . Esophagogastroduodenoscopy 06/21/2012    Procedure: ESOPHAGOGASTRODUODENOSCOPY (EGD);  Surgeon: Theda Belfast, MD;  Location: [facility];  Service: Endoscopy;  Laterality: N/A;   Allergies  Allergen Reactions  . Altace (Ramipril) Other (See Comments)    Reaction unknown  . Ambien (Zolpidem Tartrate) Other (See Comments)    Reaction unknown  . Codeine Other (See Comments)    Reaction unknown  . Demerol (Meperidine) Other (See  Comments)    Reaction unknown  . Penicillins Other (See Comments)    Reaction unknown  . Sulfa Antibiotics Other (See Comments)    Reaction unknown  . Versed (Midazolam) Other (See Comments)    Reaction unknown   I have reviewed the patient's current medications   . cholecalciferol  1,000 Units Oral Daily  . donepezil  5 mg Oral QHS  . famotidine  20 mg Oral BID  . feeding supplement  237 mL Oral BID BM  . Fluticasone-Salmeterol  1 puff Inhalation Q12H  . imipenem-cilastatin  250 mg Intravenous Q12H  . ipratropium  0.5 mg Nebulization Q6H  . levalbuterol  0.63 mg Nebulization Q6H  . mirtazapine  7.5 mg Oral QHS  . montelukast  10 mg Oral q morning - 10a  . ondansetron (ZOFRAN) IV  4 mg Intravenous Once  . simvastatin  10 mg Oral q1800  . vancomycin  500 mg Intravenous Q48H   History   Social History  . Marital Status: Married    Spouse Name: N/A    Number of Children: N/A  . Years of Education: N/A   Occupational History  . Retired housewife    Social History Main Topics  . Smoking status: Former Smoker    Quit date: 04/12/2012  . Smokeless tobacco: Not on file   Comment: per dtr, pt quit tobacco in 1998.  Marland Kitchen Alcohol Use: No  . Drug Use: No  . Sexually Active: No  Other Topics Concern  . Not on file   Social History Narrative   Married, she lives at [facility] in assisted living, husband is in memory care unit.     Family History  Problem Relation Age of Onset  . Coronary artery disease Father   . Coronary artery disease Mother   . Hypertension Mother   . Breast cancer Maternal Grandmother   . Breast cancer Maternal Aunt     ROS: She has had problems with constipation and diarrhea. No melena Full 14 point review of systems complete and found to be negative unless listed above.  Physical Exam: Blood pressure 156/54, pulse 82, temperature 98.2 F (36.8 C), temperature source Oral, resp. rate 23, height 5' (1.524 m), weight 92 lb 9.5 oz (42 kg), SpO2  96.00%.  General: Well developed, well nourished, female in mild distress Head: Eyes PERRLA, No xanthomas.   Normocephalic and atraumatic, oropharynx without edema or exudate. Dentition: poor Lungs: bilateral late inspiratory/early expiratory basilar rales, left > right; decreased breath sounds in the right midlung field with minimal, if any, egophony Heart: HRRR S1 S2, no rub/gallop,  No significant murmur. pulses are 1+ both lower and right upper extrem, left upper extrem 2+.   Neck: No carotid bruits. No lymphadenopathy.  Mild JVD at 10-12 cm. Abdomen: Bowel sounds present, abdomen soft and non-tender without masses or hernias noted. Msk:  No spine or cva tenderness. No weakness, no joint deformities or effusions. Extremities: No clubbing or cyanosis. No edema.  Neuro: Alert and oriented X 3. No focal deficits noted. Psych:  Good affect, responds appropriately Skin: No rashes or lesions noted. Ecchymosis from blood draws and IVs.  Labs:   Lab Results  Component Value Date   WBC 16.3* 10/16/2012   HGB 15.5* 10/16/2012   HCT 46.8* 10/16/2012   MCV 88.8 10/16/2012   PLT 299 10/16/2012    Basename 10/16/12 0835  INR 1.06     Lab 10/16/12 0835  NA 137  K 4.7  CL 102  CO2 23  BUN 21  CREATININE 1.38*  CALCIUM 9.2  PROT 7.2  BILITOT 0.3  ALKPHOS 86  ALT 11  AST 24  GLUCOSE 199*    Basename 10/16/12 1255 10/16/12 0836  CKTOTAL 83 --  CKMB 7.7* --  TROPONINI 1.66* 0.31*   Pro B Natriuretic peptide (BNP)  Date/Time Value Range Status  10/16/2012  8:36 AM 9826.0* 0 - 450 pg/mL Final   ABG    Component Value Date/Time   PHART 7.280* 10/16/2012 0950   PCO2ART 47.8* 10/16/2012 0950   PO2ART 174.0* 10/16/2012 0950   HCO3 22.4 10/16/2012 0950   TCO2 24 10/16/2012 0950   ACIDBASEDEF 5.0* 10/16/2012 0950   O2SAT 99.0 10/16/2012 0950   ECG:  10/16/2012-SINUS TACHYCARDIA at a rate of 100 bpm, LAA, LVH; J-point elevation consistent with early repolarization; ST-T wave  abnormality consistent with LVH or anterolateral ischemia. No previous tracing for comparison.  Radiology:   Chest Portable 1 View 10/16/2012  *RADIOLOGY REPORT*  Clinical Data: Shortness of breath, respiratory distress  PORTABLE CHEST - 1 VIEW  Comparison: Portable exam 0857 hours compared to 06/13/2012  Findings: Minimal enlargement of cardiac silhouette. Mediastinal contours normal. Extensive airspace infiltrate identified in lingula and left lower lobe consistent with pneumonia. Aspiration not entirely excluded. Underlying emphysematous changes. Probable mild infiltrate at right base as well. No gross pleural effusion or pneumothorax. Diffuse osseous demineralization.  IMPRESSION: Extensive airspace consolidation involving the lingula and left lower lobe  consistent with pneumonia though aspiration not excluded. Minimal similar right basilar infiltrate.   Original Report Authenticated By: Lollie Marrow, M.D.    ASSESSMENT AND PLAN:   The patient was seen today by Dr Dietrich Pates, the patient evaluated and the data reviewed.   Acute hypoxic respiratory failure - mgt per primary MD, with elevated BNP/troponin, will continue to cycle enzymes, and check an echo. CT chest to r/o PE has been ordered. BNP/troponin elevation but may be secondary to the PNA/resp failure, not a primary cardiac ischemic syndrome. We will continue to follow. Peripheral arterial disease - no acute symptoms, follow Elevated troponin - continue to cycle Hemoptysis - per primary MD  Theodore Demark, [facility] 10/16/2012, 3:03 PM  Cardiology Attending Patient interviewed and examined. Discussed with Theodore Demark, PAC.  Above note annotated and modified based upon my findings.  Chest x-ray does appear to demonstrate vascular redistribution and some interstitial edema, but there is a clear dense infiltrate in the left lower lobe. An echocardiogram will be obtained to assess left ventricular systolic function. Elevations in BNP level and  troponin will reflect a primary pulmonary problem such as pneumonia.  A modest respiratory acidosis is present, but there is a substantial superimposed metabolic acidosis. Additional diagnostic testing should be performed to elucidate patient's acid-base status.  [place], MD 10/16/2012, 4:03 PM

## 2012-10-16 NOTE — Progress Notes (Signed)
UR COMPLETED  

## 2012-10-16 NOTE — ED Provider Notes (Signed)
History     CSN: [id number]  Arrival date & time 10/16/12  5621   First MD Initiated Contact with Patient 10/16/12 0827      Chief Complaint  Patient presents with  . Respiratory Distress    (Consider location/radiation/quality/duration/timing/severity/associated sxs/prior treatment) HPI Comments: Ms. Heady presents via EMS from a local nursing home for evaluation of respiratory difficulty.  She states she developed worsening shortness of breath and a cough productive of bloody sputum overnight.  She also reports that it hurts and she feels nauseated when she coughs.  She denies fevr.  EMS reported a respiratory rate 40-60 and O2 sat of 70%.  She appeared distressed and they placed her on CPAP during transport.  The history is provided by the patient. No language interpreter was used.    Past Medical History  Diagnosis Date  . Hypertension   . Peripheral arterial disease   . Cognitive disorder   . Esophageal stricture   . Osteoarthritis   . Anxiety     Past Surgical History  Procedure Date  . Femoral bypass   . Esophagogastroduodenoscopy 06/21/2012    Procedure: ESOPHAGOGASTRODUODENOSCOPY (EGD);  Surgeon: Theda Belfast, MD;  Location: [facility];  Service: Endoscopy;  Laterality: N/A;    Family History  Problem Relation Age of Onset  . Coronary artery disease Father   . Coronary artery disease Mother   . Hypertension Mother   . Breast cancer Maternal Grandmother   . Breast cancer Maternal Aunt     History  Substance Use Topics  . Smoking status: Former Smoker    Quit date: 04/12/2012  . Smokeless tobacco: Not on file  . Alcohol Use: No    OB History    Grav Para Term Preterm Abortions TAB SAB Ect Mult Living                  Review of Systems  Constitutional: Positive for fatigue. Negative for fever, activity change and appetite change.  HENT: Negative for congestion, sore throat, rhinorrhea, drooling and neck pain.   Eyes: Negative.   Respiratory:  Positive for cough and shortness of breath. Negative for choking and chest tightness.   Cardiovascular: Positive for chest pain (only when coughing).  Gastrointestinal: Positive for nausea. Negative for vomiting, diarrhea and constipation.  Genitourinary: Negative for dysuria, hematuria and flank pain.  Musculoskeletal: Positive for back pain and arthralgias.  Skin: Positive for pallor. Negative for color change, rash and wound.  Neurological: Positive for tremors and weakness. Negative for dizziness, syncope and light-headedness.  Hematological: Does not bruise/bleed easily.  Psychiatric/Behavioral: Negative.     Allergies  Altace; Ambien; Codeine; Demerol; Penicillins; Sulfa antibiotics; and Versed  Home Medications   Current Outpatient Rx  Name Route Sig Dispense Refill  . ACETAMINOPHEN 325 MG PO TABS Oral Take 650 mg by mouth every 6 (six) hours as needed. For pain    . ALBUTEROL SULFATE HFA 108 (90 BASE) MCG/ACT IN AERS Inhalation Inhale 2 puffs into the lungs every 4 (four) hours as needed. Wheezing/shortness of breath.    . ALPRAZOLAM 0.25 MG PO TABS Oral Take 0.25 mg by mouth 2 (two) times daily as needed. For anxiety    . ASPIRIN 81 MG PO CHEW Oral Chew 81 mg by mouth daily.    Marland Kitchen BENZOCAINE 20 % MT PSTE Mouth/Throat Use as directed 1 application in the mouth or throat 4 (four) times daily as needed. For pain or discomfort    . CARBOXYMETHYLCELLUL-GLYCERIN 0.5-0.9 %  OP SOLN Both Eyes Place 1 drop into both eyes 4 (four) times daily as needed. For dry eyes    . VITAMIN D 1000 UNITS PO CAPS Oral Take 1,000 Units by mouth daily.    Marland Kitchen CLOPIDOGREL BISULFATE 75 MG PO TABS Oral Take 75 mg by mouth daily.    . DONEPEZIL HCL 5 MG PO TABS Oral Take 5 mg by mouth at bedtime.    [place] COMPLETE PO LIQD Oral Take 237 mLs by mouth 2 (two) times daily between meals.    Marland Kitchen FLUTICASONE-SALMETEROL 100-50 MCG/DOSE IN AEPB Inhalation Inhale 1 puff into the lungs every 12 (twelve) hours.    Marland Kitchen  MELATONIN 1 MG PO TABS Oral Take 1 mg by mouth at bedtime.    Marland Kitchen METOPROLOL TARTRATE 25 MG PO TABS Oral Take 25 mg by mouth 2 (two) times daily.    Marland Kitchen MIRTAZAPINE 15 MG PO TABS Oral Take 7.5 mg by mouth at bedtime.    Marland Kitchen MONTELUKAST SODIUM 10 MG PO TABS Oral Take 10 mg by mouth every morning.    Marland Kitchen PRAVASTATIN SODIUM 20 MG PO TABS Oral Take 20 mg by mouth daily.    Marland Kitchen PROMETHAZINE HCL 12.5 MG PO TABS Oral Take 12.5 mg by mouth every 6 (six) hours as needed. For nauesea    . RANITIDINE HCL 300 MG PO TABS Oral Take 300 mg by mouth 2 (two) times daily.    Marland Kitchen SALINE 0.65 % NA SOLN Nasal Place 2 sprays into the nose every morning. May keep at bedside.      BP 133/102  Pulse 109  Temp 98.3 F (36.8 C) (Axillary)  Resp 26  SpO2 97%  Physical Exam  Nursing note and vitals reviewed. Constitutional: She is oriented to person, place, and time. She is cooperative.  Non-toxic appearance. She has a sickly appearance. She appears ill. She appears distressed. She is not intubated.  HENT:  Head: Normocephalic and atraumatic. Head is without raccoon's eyes, without Battle's sign, without laceration and without right periorbital erythema. No trismus in the jaw.  Right Ear: External ear and ear canal normal. No mastoid tenderness. Decreased hearing is noted.  Left Ear: External ear and ear canal normal. No mastoid tenderness. Decreased hearing is noted.  Nose: Nose normal. No sinus tenderness or septal deviation. Right sinus exhibits no maxillary sinus tenderness and no frontal sinus tenderness. Left sinus exhibits no maxillary sinus tenderness and no frontal sinus tenderness.  Mouth/Throat: Uvula is midline and oropharynx is clear and moist. Mucous membranes are not pale, not dry and not cyanotic. No uvula swelling.  Eyes: EOM and lids are normal. Pupils are equal, round, and reactive to light. Right conjunctiva is not injected. Right conjunctiva has no hemorrhage. Left conjunctiva is not injected. Left conjunctiva  has no hemorrhage. Right eye exhibits normal extraocular motion and no nystagmus. Left eye exhibits normal extraocular motion and no nystagmus. Pupils are equal.       Pale conjunctiva  Neck: Trachea normal, full passive range of motion without pain and phonation normal. Neck supple. Normal carotid pulses and no JVD present. No tracheal tenderness, no spinous process tenderness and no muscular tenderness present. Carotid bruit is not present. No rigidity. No tracheal deviation, no edema, no erythema and normal range of motion present.  Cardiovascular: Regular rhythm, intact distal pulses and normal pulses.   No extrasystoles are present. Tachycardia present.  PMI is not displaced.  Exam reveals distant heart sounds. Exam reveals no decreased pulses.  Pulmonary/Chest: Accessory muscle usage present. No stridor. Tachypnea noted. No apnea and not bradypneic. She is not intubated. She is in respiratory distress. She has no decreased breath sounds. She has no wheezes. She has rhonchi (Marked bilateral). She has rales.  Abdominal: Normal appearance and bowel sounds are normal. She exhibits no pulsatile midline mass and no mass. There is no tenderness. There is no rigidity, no rebound, no guarding, no CVA tenderness, no tenderness at McBurney's point and negative Murphy's sign. No hernia.  Musculoskeletal: Normal range of motion. She exhibits no edema and no tenderness.  Neurological: She is alert and oriented to person, place, and time.  Skin: Skin is warm and dry. No rash noted. No erythema. No pallor.  Psychiatric: She has a normal mood and affect. Her behavior is normal.    ED Course  Procedures (including critical care time)   Labs Reviewed  CBC  COMPREHENSIVE METABOLIC PANEL  PRO B NATRIURETIC PEPTIDE  TROPONIN I  PROTIME-INR  APTT  BLOOD GAS, ARTERIAL   No results found.   No diagnosis found.   Date: 10/16/2012  Rate: 112 bpm  Rhythm: sinus tachycardia  QRS Axis: normal  Intervals:  normal  ST/T Wave abnormalities: nonspecific ST changes  Conduction Disutrbances:LVH  Narrative Interpretation:  Sinus tachycardia with diffuse T wave changes  Old EKG Reviewed: none available      MDM  Pt presents c/o SOB and hemoptysis.  She awoke this morning extremely SOB.  Note increased respiratory rate and effort while on bipap (CPAP was placed prior to arrival and changed to bipap immediately on arrival).  She is A&O and answers questions appropriately but with significant effort.  Note diffuse bilateral ronchi but no JVD.  Will continue bipap and obtain comprehensive labs, pBNP, ABG, CXR, and rectal temp.  Note pt to have an elevated BP.  Current differential diagnosis include PNA, COPD/bronchitis, CHF, PE.  Will wait to view CXR prior to considering giving nitrates.  Albuterol and atrovent has been ordered.  1610.  Pt's respiratory rate and effort have greatly improved. She appears much more comfortable on BiPAP. Her chest x-ray demonstrates a significant left lower lobe opacity concerning for pneumonia. She has a small right basilar opacity or infiltrate also. Blood cultures have been obtained. She has an allergy to penicillins, so she is being covered with Levaquin. She also has an elevated proBNP however her blood pressure is now in the low normal range. Will not administer nitrates at this time. I discussed her evaluation with Dr. Sheilah Pigeon. Plan admit to a step down bed under the hospitalist service. Dr. Sheilah Pigeon her request will add vancomycin to her antibiotic regimen. Reviewed her nursing home notes. She has a valid DO NOT RESUSCITATE. She does have a slightly elevated troponin at 0.31. She denies any chest pain.  CRITICAL CARE Performed by: Dana Allan T   Total critical care time: 40+  Critical care time was exclusive of separately billable procedures and treating other patients.  Critical care was necessary to treat or prevent imminent or life-threatening  deterioration.  Critical care was time spent personally by me on the following activities: development of treatment plan with patient and/or surrogate as well as nursing, discussions with consultants, evaluation of patient's response to treatment, examination of patient, obtaining history from patient or surrogate, ordering and performing treatments and interventions, ordering and review of laboratory studies, ordering and review of radiographic studies, pulse oximetry and re-evaluation of patient's condition.       Cali Hope T  Kris No, MD 10/16/12 1002

## 2012-10-16 NOTE — Progress Notes (Signed)
Speech Language Pathology  New orders received. Reviewed patient's chart.  Patient previously seen by SLP during previous [facility] admission 06/16/12 for primary esophageal dysphagia. At the time aspiration was not suspected although patient was at an increased risk due to severity of esophageal deficits. In light of new admission with suspected aspiration PNA, recommend deferring bedside exam and proceeding with MBS to objectively evaluate oropharyngeal and esophageal swallowing function in order to determine least restrictive diet with lowest aspiration risk. Will plan for MBS 10/25. MD, please order if agree.   Ferdinand Lango MA, CCC-SLP [phone]  Ferdinand Lango [name] 10/16/2012, 3:18 PM

## 2012-10-16 NOTE — Progress Notes (Signed)
  Echocardiogram 2D Echocardiogram has been performed.  Sura Canul 10/16/2012, 4:05 PM

## 2012-10-16 NOTE — ED Notes (Signed)
Per EMS Pt was picked up from [facility] with a complaint of resp distress. O2 sat 70's on RA, resp rate in the 60's. Put on CPAP and O2 sat went to mid 80's.

## 2012-10-16 NOTE — Consult Note (Signed)
Name: [name] MRN: [id number] DOB: June 26, 1922    LOS: 0    History of Present Illness:  This is a 78 year old female who lives in an ALF. She has baseline exertional dyspnea and has had cough (non-productive) for > 2 weeks which seems to be worse when she is recumbent. She also has noticed the sensation of difficulty swallowing for the last 2 weeks (of note she has had prior esophageal dilations in past). Was in usual stat of health until acutely the am of 10/24. States had even been seen on 10/23 w/ clean bill of health by rounding NP at ALF. She denies sick exposure, fevers, chills, chest pain, nausea or vomiting. Has had not LE swelling. No wt gain or loss. She presented to the ER in acute distress requiring NIPPV.  Presented to ER on 10/24 with acute on chronic dyspnea, hemoptysis: initially bright red, ~1/4 teaspoon in volume estimated that she had a total of 6-8 episodes of this before it started to subside: no more pink tinged. On initial eval noted to have marked L>R airspace disease. CT chest negative for PE. Pulm asked to see for hemoptysis.   Cultures: mrsa pcr 10/24>>> BC x2 10/24>>> Sputum 10/24>>> Influenza 10/24>>> UC 10/27>>>  Antibiotics: vanc 10/24>>> primaxin 10/24>>>  Tests / Events:  Past Medical History  Diagnosis Date  . Hypertension   . Peripheral arterial disease   . Cognitive disorder   . Esophageal stricture   . Osteoarthritis   . Anxiety   . UTI (lower urinary tract infection) June 2013  . COPD (chronic obstructive pulmonary disease)    Past Surgical History  Procedure Date  . Femoral bypass   . Esophagogastroduodenoscopy 06/21/2012    Procedure: ESOPHAGOGASTRODUODENOSCOPY (EGD);  Surgeon: Theda Belfast, MD;  Location: [facility];  Service: Endoscopy;  Laterality: N/A;   Prior to Admission medications   Medication Sig Start Date End Date Taking? Authorizing Provider  acetaminophen (MAPAP) 325 MG tablet Take 650 mg by mouth every 6  (six) hours as needed. For pain   Yes Historical Provider, MD  albuterol (PROVENTIL HFA;VENTOLIN HFA) 108 (90 BASE) MCG/ACT inhaler Inhale 2 puffs into the lungs every 4 (four) hours as needed. Wheezing/shortness of breath.   Yes Historical Provider, MD  ALPRAZolam (XANAX) 0.25 MG tablet Take 0.25 mg by mouth 2 (two) times daily as needed. For anxiety   Yes Historical Provider, MD  aspirin 81 MG chewable tablet Chew 81 mg by mouth daily.   Yes Historical Provider, MD  benzocaine (ORABASE-B) 20 % PSTE Use as directed 1 application in the mouth or throat 4 (four) times daily as needed. For pain or discomfort 06/23/12  Yes Joseph Art, DO  Carboxymethylcellul-Glycerin 0.5-0.9 % SOLN Place 1 drop into both eyes 4 (four) times daily as needed. For dry eyes   Yes Historical Provider, MD  Cholecalciferol (VITAMIN D) 1000 UNITS capsule Take 1,000 Units by mouth daily.   Yes Historical Provider, MD  clopidogrel (PLAVIX) 75 MG tablet Take 75 mg by mouth daily.   Yes Historical Provider, MD  donepezil (ARICEPT) 5 MG tablet Take 5 mg by mouth at bedtime.   Yes Historical Provider, MD  feeding supplement (ENSURE COMPLETE) LIQD Take 237 mLs by mouth 2 (two) times daily between meals. 06/23/12  Yes Joseph Art, DO  Fluticasone-Salmeterol (ADVAIR) 100-50 MCG/DOSE AEPB Inhale 1 puff into the lungs every 12 (twelve) hours.   Yes Historical Provider, MD  Melatonin 1  MG TABS Take 1 mg by mouth at bedtime.   Yes Historical Provider, MD  metoprolol tartrate (LOPRESSOR) 25 MG tablet Take 25 mg by mouth 2 (two) times daily.   Yes Historical Provider, MD  mirtazapine (REMERON) 15 MG tablet Take 7.5 mg by mouth at bedtime.   Yes Historical Provider, MD  montelukast (SINGULAIR) 10 MG tablet Take 10 mg by mouth every morning.   Yes Historical Provider, MD  pravastatin (PRAVACHOL) 20 MG tablet Take 20 mg by mouth daily.   Yes Historical Provider, MD  promethazine (PHENERGAN) 12.5 MG tablet Take 12.5 mg by mouth every 6 (six)  hours as needed. For nauesea   Yes Historical Provider, MD  ranitidine (ZANTAC) 300 MG tablet Take 300 mg by mouth 2 (two) times daily.   Yes Historical Provider, MD  sodium chloride (OCEAN) 0.65 % SOLN nasal spray Place 2 sprays into the nose every morning. May keep at bedside.   Yes Historical Provider, MD   Allergies Allergies  Allergen Reactions  . Altace (Ramipril) Other (See Comments)    Reaction unknown  . Ambien (Zolpidem Tartrate) Other (See Comments)    Reaction unknown  . Codeine Other (See Comments)    Reaction unknown  . Demerol (Meperidine) Other (See Comments)    Reaction unknown  . Penicillins Other (See Comments)    Reaction unknown  . Sulfa Antibiotics Other (See Comments)    Reaction unknown  . Versed (Midazolam) Other (See Comments)    Reaction unknown    Family History Family History  Problem Relation Age of Onset  . Coronary artery disease Father   . Coronary artery disease Mother   . Hypertension Mother   . Breast cancer Maternal Grandmother   . Breast cancer Maternal Aunt     Social History  reports that she quit smoking about 6 months ago. She does not have any smokeless tobacco history on file. She reports that she does not drink alcohol or use illicit drugs.  Review Of Systems  11 points review of systems is negative with an exception of listed in HPI.  Vital Signs: BP 156/54  Pulse 82  Temp 98.2 F (36.8 C) (Oral)  Resp 23  Ht 5' (1.524 m)  Wt 42 kg (92 lb 9.5 oz)  BMI 18.08 kg/m2  SpO2 97% 50%      . DISCONTD: albuterol 10 mg (10/16/12 0949)     Intake/Output Summary (Last 24 hours) at 10/16/12 1417 Last data filed at 10/16/12 1106  Gross per 24 hour  Intake    150 ml  Output      1 ml  Net    149 ml    Physical Examination: General:  Frail, elderly white female in no acute distress.  Neuro:  Awake, oriented.  HEENT:  [place], no JVD Cardiovascular:  rrr Lungs:  Crackles both bases  Abdomen:  Soft, non-tender    Musculoskeletal:  Intact  Skin:  Intact    Labs and Imaging:   Lab 10/16/12 0835  NA 137  K 4.7  CL 102  CO2 23  BUN 21  CREATININE 1.38*  GLUCOSE 199*    Lab 10/16/12 0835  HGB 15.5*  HCT 46.8*  WBC 16.3*  PLT 299  CT chest Marked left > right consolidative  airspace disease c/w PNA   Assessment and Plan: 1) Acute respiratory failure in setting of R>L necrotizing pneumonia. Primary diff dx include HCAP vs aspiration (likely) 2) Hemoptysis, secondary to PNA necrotizing 3) h/o esophageal  stricture: recurrence of recumbent cough and difficulty swallowing raises concern for reoccurrence  4) h/o COPD (no PFTs to quantify) 5) h/o PAD  Plan/rec -change abx to vanc and primaxin (necrotizing, cover polymicrobial), if becomes toxic, add clinda -NPO except meds -SLP bed side swallow eval -if clinical status will allow would consider GI re-consultation for possible esophageal stricture -will d/c BIPAP, she is aspiration risk with stricture -cont pulm hygiene  -hold plavix for now   pccm will follow  BABCOCK,PETE 10/16/2012, 2:17 PM   I have fully examined this patient and agree with above findings.    And edited in full  Mcarthur Rossetti. Tyson Alias, MD, FACP Pgr: [phone] [facility]'

## 2012-10-17 ENCOUNTER — Inpatient Hospital Stay (HOSPITAL_COMMUNITY): Payer: Medicare Other

## 2012-10-17 DIAGNOSIS — I5042 Chronic combined systolic (congestive) and diastolic (congestive) heart failure: Secondary | ICD-10-CM | POA: Diagnosis present

## 2012-10-17 DIAGNOSIS — I5043 Acute on chronic combined systolic (congestive) and diastolic (congestive) heart failure: Secondary | ICD-10-CM

## 2012-10-17 DIAGNOSIS — J984 Other disorders of lung: Secondary | ICD-10-CM

## 2012-10-17 DIAGNOSIS — N179 Acute kidney failure, unspecified: Secondary | ICD-10-CM

## 2012-10-17 DIAGNOSIS — I509 Heart failure, unspecified: Secondary | ICD-10-CM

## 2012-10-17 DIAGNOSIS — F411 Generalized anxiety disorder: Secondary | ICD-10-CM

## 2012-10-17 LAB — BASIC METABOLIC PANEL
BUN: 30 mg/dL — ABNORMAL HIGH (ref 6–23)
CO2: 22 mEq/L (ref 19–32)
CO2: 20 mEq/L (ref 19–32)
Calcium: 8.9 mg/dL (ref 8.4–10.5)
Chloride: 105 mEq/L (ref 96–112)
Creatinine, Ser: 1.5 mg/dL — ABNORMAL HIGH (ref 0.50–1.10)
Glucose, Bld: 112 mg/dL — ABNORMAL HIGH (ref 70–99)
Potassium: 4.4 mEq/L (ref 3.5–5.1)
Sodium: 137 mEq/L (ref 135–145)
Sodium: 133 mEq/L — ABNORMAL LOW (ref 135–145)

## 2012-10-17 LAB — URINE CULTURE: Colony Count: NO GROWTH

## 2012-10-17 LAB — EXPECTORATED SPUTUM ASSESSMENT W GRAM STAIN, RFLX TO RESP C

## 2012-10-17 LAB — TROPONIN I: Troponin I: 1.87 ng/mL (ref <0.30)

## 2012-10-17 LAB — LEGIONELLA ANTIGEN, URINE

## 2012-10-17 LAB — CK TOTAL AND CKMB (NOT AT ARMC)
Relative Index: INVALID (ref 0.0–2.5)
Total CK: 93 U/L (ref 7–177)

## 2012-10-17 LAB — LIPID PANEL
Cholesterol: 98 mg/dL (ref 0–200)
Total CHOL/HDL Ratio: 1.8 RATIO
VLDL: 15 mg/dL (ref 0–40)

## 2012-10-17 LAB — CBC
HCT: 33.4 % — ABNORMAL LOW (ref 36.0–46.0)
Hemoglobin: 10.6 g/dL — ABNORMAL LOW (ref 12.0–15.0)
MCH: 27.9 pg (ref 26.0–34.0)
MCHC: 31.7 g/dL (ref 30.0–36.0)
RBC: 3.8 MIL/uL — ABNORMAL LOW (ref 3.87–5.11)

## 2012-10-17 MED ORDER — ACETAMINOPHEN 325 MG PO TABS
650.0000 mg | ORAL_TABLET | ORAL | Status: DC | PRN
  Administered 2012-10-17 – 2012-10-18 (×4): 650 mg via ORAL
  Filled 2012-10-17 (×4): qty 2

## 2012-10-17 MED ORDER — TETRAHYDROZOLINE HCL 0.05 % OP SOLN
1.0000 [drp] | Freq: Four times a day (QID) | OPHTHALMIC | Status: DC
  Administered 2012-10-17 – 2012-10-24 (×28): 1 [drp] via OPHTHALMIC
  Filled 2012-10-17: qty 15

## 2012-10-17 MED ORDER — LEVALBUTEROL HCL 0.63 MG/3ML IN NEBU
0.6300 mg | INHALATION_SOLUTION | RESPIRATORY_TRACT | Status: DC | PRN
  Administered 2012-10-19 – 2012-10-20 (×3): 0.63 mg via RESPIRATORY_TRACT
  Filled 2012-10-17 (×3): qty 3

## 2012-10-17 MED ORDER — ASPIRIN 81 MG PO CHEW
81.0000 mg | CHEWABLE_TABLET | Freq: Every day | ORAL | Status: DC
  Administered 2012-10-17 – 2012-10-24 (×8): 81 mg via ORAL
  Filled 2012-10-17 (×8): qty 1

## 2012-10-17 MED ORDER — METOPROLOL TARTRATE 12.5 MG HALF TABLET
12.5000 mg | ORAL_TABLET | Freq: Three times a day (TID) | ORAL | Status: DC
  Administered 2012-10-17 – 2012-10-18 (×4): 12.5 mg via ORAL
  Filled 2012-10-17 (×6): qty 1

## 2012-10-17 MED ORDER — IPRATROPIUM BROMIDE 0.02 % IN SOLN
0.5000 mg | RESPIRATORY_TRACT | Status: DC | PRN
  Administered 2012-10-19 – 2012-10-20 (×3): 0.5 mg via RESPIRATORY_TRACT
  Filled 2012-10-17 (×3): qty 2.5

## 2012-10-17 NOTE — Progress Notes (Signed)
Name: [name] MRN: [id number] DOB: Aug 18, 1922    LOS: 1    History of Present Illness:  This is a 78 year old female who lives in an ALF. She has baseline exertional dyspnea and has had cough (non-productive) for > 2 weeks which seems to be worse when she is recumbent. She also has noticed the sensation of difficulty swallowing for the last 2 weeks (of note she has had prior esophageal dilations in past). Was in usual stat of health until acutely the am of 10/24. States had even been seen on 10/23 w/ clean bill of health by rounding NP at ALF. She denies sick exposure, fevers, chills, chest pain, nausea or vomiting. Has had not LE swelling. No wt gain or loss. She presented to the ER in acute distress requiring NIPPV.  Presented to ER on 10/24 with acute on chronic dyspnea, hemoptysis: initially bright red, ~1/4 teaspoon in volume estimated that she had a total of 6-8 episodes of this before it started to subside: no more pink tinged. On initial eval noted to have marked L>R airspace disease. CT chest negative for PE. Pulm asked to see for hemoptysis.   Cultures: mrsa pcr 10/24>>>neg BC x2 10/24>>> Sputum 10/24>>> Influenza 10/24>>> UC 10/27>>>  Antibiotics: vanc 10/24>>> primaxin 10/24>>>  TVital Signs: BP 137/118  Pulse 96  Temp 98.4 F (36.9 C) (Oral)  Resp 30  Ht 5' (1.524 m)  Wt 42 kg (92 lb 9.5 oz)  BMI 18.08 kg/m2  SpO2 94% 2 liters       . dextrose 5 % and 0.45% NaCl 60 mL/hr at 10/17/12 1200  . DISCONTD: albuterol 10 mg (10/16/12 0949)     Intake/Output Summary (Last 24 hours) at 10/17/12 1219 Last data filed at 10/17/12 1200  Gross per 24 hour  Intake   1600 ml  Output    500 ml  Net   1100 ml    Physical Examination: General:  Frail, elderly white female in no acute distress.  Neuro:  Awake, oriented.  HEENT:  [place], no JVD Cardiovascular:  rrr Lungs:  Crackles on left   Abdomen:  Soft, non-tender  Musculoskeletal:  Intact  Skin:  Intact     Labs and Imaging:   Lab 10/17/12 0540 10/16/12 0835  NA 137 137  K 4.4 4.7  CL 105 102  CO2 22 23  BUN 30* 21  CREATININE 1.81* 1.38*  GLUCOSE 112* 199*    Lab 10/17/12 0540 10/16/12 0835  HGB 10.6* 15.5*  HCT 33.4* 46.8*  WBC 9.1 16.3*  PLT 152 299  CXR Marked left > right consolidative  airspace disease c/w PNA -->improved on f/u film 10/25  Assessment and Plan: 1) Acute respiratory failure in setting of R>L pneumonia. Primary diff dx include HCAP vs aspiration (likely) 2) Hemoptysis, secondary to PNA-->resolved.  3) h/o esophageal stricture: recurrence of recumbent cough and difficulty swallowing raises concern for reoccurrence -->MBS shows mild dysphagia  4) h/o COPD (no PFTs to quantify) 5) h/o PAD  Plan/rec -change abx to vanc and primaxin (necrotizing, cover polymicrobial), if becomes toxic, add clinda -dysphagia precautions -would consider GI re-consultation for possible esophageal stricture -cont pulm hygiene  -hold plavix for now -->resume next 48 h if hemoptysis does not reoccur   pccm will s/o  BABCOCK,PETE 10/17/2012, 12:19 PM   Reviewed above, examined pt, and agree with assessment/plan.  He has improving pneumonia, and no further hemoptysis.  Continue Abx per primary team.  No  indication for bronchoscopy at present.  PCCM will sign off.   Please call if additional help needed.  Coralyn Helling, MD [facility] Pulmonary/Critical Care 10/17/2012, 12:57 PM Pager:  [phone] After 3pm call: [phone]

## 2012-10-17 NOTE — Progress Notes (Signed)
[facility] PROGRESS NOTE  [name] ZOX:[id number] DOB: [birth year] DOA: 10/16/2012 PCP: [name], MD  Assessment/Plan: Principal Problem:  *Acute hypoxic respiratory failure  Due to HCAP and CHF exacerbation- improving.   Active Problems:   Healthcare-associated pneumonia Cont coverage with Vanc and Primaxin. Sputum culture ordered. Pt is now on 2 L of O2 and appears to be doing well.    Elevated troponin Due to demand ischemia, cardiology following.   Hemoptysis Due to pneumonia   Systolic and diastolic CHF, acute on chronic Cont lasix per cardiology- she has some mild-mod pulm edema   Peripheral arterial disease Plavix on hold due to hemoptysis- if this does not recurr by tomorrow, will resume Plavix.   Code Status: DNr Disposition Plan: follow in SDU DVT prophylaxis: SCDs   Brief narrative: Patient is a 78 year old female with history of peripheral arterial disease, hypertension, osteoarthritis, history of a esophageal stricture status post dilatation in June 2013 was sent from skilled nursing facility for acute respiratory distress today. At the time of my evaluation patient was on BiPAP, daughter present in the room and was able to converse. Per the daughter, she saw the patient yesterday and was "fine". Patient states that she was having coughing and worsening shortness of breath since last night. She then developed hemoptysis with more than 4 or 5 episodes today morning with coughing. Patient reported that she noticed "fresh blood" with coughing. Patient was placed on CPAP via EMS. She also reported some left-sided chest pain posteriorly worse with coughing. At the time of arrival, O2 sats were in the 70s on room air and respiratory rate in 50s to 60s. Patient's daughter reported that she has been having dyspnea on exertion, progressively worsening over last 3-4 months.   Consultants:  Cardiology  Pulmonary    Procedures:  none  Antibiotics:  Vancomycin- 10/24  Primaxin- 10/24  HPI/Subjective: Pt alert, complains of some left flank/ lower back pain that has been going on for a few weeks and a mild dry cough. No dyspnea or chest pain.   Objective: Filed Vitals:   10/17/12 0846 10/17/12 1200 10/17/12 1425 10/17/12 1600  BP:  137/118  150/57  Pulse:  96  112  Temp:  98.4 F (36.9 C)  98.6 F (37 C)  TempSrc:  Oral  Oral  Resp:    31  Height:      Weight:      SpO2: 94% 94% 94% 94%    Intake/Output Summary (Last 24 hours) at 10/17/12 1735 Last data filed at 10/17/12 1700  Gross per 24 hour  Intake   1580 ml  Output    525 ml  Net   1055 ml    Exam:   General:  Alert, no distress  Cardiovascular: RRR, no murmur  Respiratory: crackles in LLL  Abdomen: soft, NT, ND, BS+  Ext: no c/c/e  Data Reviewed: Basic Metabolic Panel:  Lab 10/17/12 8119 10/16/12 0835  NA 137 137  K 4.4 4.7  CL 105 102  CO2 22 23  GLUCOSE 112* 199*  BUN 30* 21  CREATININE 1.81* 1.38*  CALCIUM 8.6 9.2  MG -- --  PHOS -- --   Liver Function Tests:  Lab 10/16/12 0835  AST 24  ALT 11  ALKPHOS 86  BILITOT 0.3  PROT 7.2  ALBUMIN 3.3*   No results found for this basename: LIPASE:5,AMYLASE:5 in the last 168 hours No results found for this basename: AMMONIA:5 in the last 168 hours CBC:  Lab  10/17/12 0540 10/16/12 0835  WBC 9.1 16.3*  NEUTROABS -- --  HGB 10.6* 15.5*  HCT 33.4* 46.8*  MCV 87.9 88.8  PLT 152 299   Cardiac Enzymes:  Lab 10/17/12 0021 10/16/12 1830 10/16/12 1824 10/16/12 1452 10/16/12 1255 10/16/12 0836  CKTOTAL 93 117 -- 94 83 --  CKMB 6.8* 8.8* -- 9.0* 7.7* --  CKMBINDEX -- -- -- -- -- --  TROPONINI 1.87* -- 1.04* 1.62* 1.66* 0.31*   BNP (last 3 results)  Basename 10/16/12 0836  PROBNP 9826.0*   CBG: No results found for this basename: GLUCAP:5 in the last 168 hours  Recent Results (from the past 240 hour(s))  CULTURE, BLOOD (ROUTINE X 2)      Status: Normal (Preliminary result)   Collection Time   10/16/12  9:40 AM      Component Value Range Status Comment   Specimen Description BLOOD LEFT RADIAL   Final    Special Requests BOTTLES DRAWN AEROBIC ONLY 6CC   Final    Culture  Setup Time 10/16/2012 15:37   Final    Culture     Final    Value:        BLOOD CULTURE RECEIVED NO GROWTH TO DATE CULTURE WILL BE HELD FOR 5 DAYS BEFORE ISSUING A FINAL NEGATIVE REPORT   Report Status PENDING   Incomplete   CULTURE, BLOOD (ROUTINE X 2)     Status: Normal (Preliminary result)   Collection Time   10/16/12  9:50 AM      Component Value Range Status Comment   Specimen Description BLOOD ARM LEFT   Final    Special Requests     Final    Value: BOTTLES DRAWN AEROBIC AND ANAEROBIC 10CC AER 3CC ANA   Culture  Setup Time 10/16/2012 15:37   Final    Culture     Final    Value:        BLOOD CULTURE RECEIVED NO GROWTH TO DATE CULTURE WILL BE HELD FOR 5 DAYS BEFORE ISSUING A FINAL NEGATIVE REPORT   Report Status PENDING   Incomplete   MRSA PCR SCREENING     Status: Normal   Collection Time   10/16/12  1:55 PM      Component Value Range Status Comment   MRSA by PCR NEGATIVE  NEGATIVE Final   URINE CULTURE     Status: Normal   Collection Time   10/16/12  4:32 PM      Component Value Range Status Comment   Specimen Description URINE, CLEAN CATCH   Final    Special Requests Normal   Final    Culture  Setup Time 10/16/2012 17:23   Final    Colony Count NO GROWTH   Final    Culture NO GROWTH   Final    Report Status 10/17/2012 FINAL   Final   CULTURE, EXPECTORATED SPUTUM-ASSESSMENT     Status: Normal   Collection Time   10/17/12  6:32 AM      Component Value Range Status Comment   Specimen Description SPUTUM   Final    Special Requests Normal   Final    Sputum evaluation     Final    Value: MICROSCOPIC FINDINGS SUGGEST THAT THIS SPECIMEN IS NOT REPRESENTATIVE OF LOWER RESPIRATORY SECRETIONS. PLEASE RECOLLECT.     Gram Stain Report Called  to,Read Back By and Verified With: B BRADT,RN AT 0802 10/17/12 BY K BARR   Report Status 10/17/2012 FINAL   Final  Studies: Ct Angio Chest Pe W/cm &/or Wo Cm  10/16/2012  *RADIOLOGY REPORT*  Clinical Data: 78 year old female with peripheral artery disease. Hypertension.  Acute respiratory distress and hemoptysis.  CT ANGIOGRAPHY CHEST  Technique:  Multidetector CT imaging of the chest using the standard protocol during bolus administration of intravenous contrast. Multiplanar reconstructed images including MIPs were obtained and reviewed to evaluate the vascular anatomy.  Contrast: 80mL OMNIPAQUE IOHEXOL 350 MG/ML SOLN  Comparison: Portable chest radiograph the same day and earlier.  Findings: Good contrast bolus timing in the pulmonary arterial tree.  Mild respiratory motion artifact. No focal filling defect identified in the pulmonary arterial tree to suggest the presence of acute pulmonary embolism.  Extensive left greater than right lower lobe pulmonary consolidation.  Within the consolidated lung, typically on the left enhancing pulmonary vasculature is noted.  No definite areas of contrast extravasation within the lungs are identified.  Scattered areas of gas within the consolidated lung on the left, some suspicious for early cavitary change, but no definite areas of pulmonary necrosis.  The right lower lobe is significantly less affected.  Underlying centrilobular emphysema.  Major airways are patent.  Cardiomegaly.  No pericardial effusion.  Only trace pleural effusions.  Negative visualized upper abdominal viscera.  No mediastinal lymphadenopathy.  Intermittent gaseous distention of the thoracic esophagus.  Negative visualized thoracic inlet.  No acute osseous abnormality identified.  IMPRESSION: 1. No evidence of acute pulmonary embolus.  No definite pulmonary vascular contrast extravasation identified in the abnormal left lung, see #2.  2.  Extensive left lower lobe consolidation.  Severe  pneumonia or aspiration could have this appearance.  Necrotizing pneumonia cannot be excluded.  3.  Comparatively mild right lower lobe consolidation.  Trace bilateral pleural effusions.  4.  Underlying emphysema.  Cardiomegaly.   Original Report Authenticated By: Harley Hallmark, M.D.    Dg Chest Port 1 View  10/17/2012  *RADIOLOGY REPORT*  Clinical Data: Pneumonia  PORTABLE CHEST - 1 VIEW  Comparison: 10/16/2012  Findings: Extensive airspace disease within the left mid and lower lung zones has improved.  Cardiomegaly persist.  No pneumothorax. Stable appearance of the right lung with scattered scarring verse atelectasis.  IMPRESSION: Improving left lower lobe pneumonia.   Original Report Authenticated By: Donavan Burnet, M.D.    Dg Chest Portable 1 View  10/16/2012  *RADIOLOGY REPORT*  Clinical Data: Shortness of breath, respiratory distress  PORTABLE CHEST - 1 VIEW  Comparison: Portable exam 0857 hours compared to 06/13/2012  Findings: Minimal enlargement of cardiac silhouette. Mediastinal contours normal. Extensive airspace infiltrate identified in lingula and left lower lobe consistent with pneumonia. Aspiration not entirely excluded. Underlying emphysematous changes. Probable mild infiltrate at right base as well. No gross pleural effusion or pneumothorax. Diffuse osseous demineralization.  IMPRESSION: Extensive airspace consolidation involving the lingula and left lower lobe consistent with pneumonia though aspiration not excluded. Minimal similar right basilar infiltrate.   Original Report Authenticated By: Lollie Marrow, M.D.    Dg Swallowing Func-speech Pathology  10/17/2012  Vivi Ferns McCoy, CCC-SLP     10/17/2012 12:07 PM Objective Swallowing Evaluation: Modified Barium Swallowing Study   Patient Details  Name: [name] MRN: [id number] Date of Birth: [birth year]  Today's Date: 10/17/2012 Time: 4098-1191 SLP Time Calculation (min): 20 min  Past Medical History:  Past Medical History  Diagnosis  Date  . Hypertension   . Peripheral arterial disease   . Cognitive disorder   . Esophageal stricture   . Osteoarthritis   .  Anxiety   . UTI (lower urinary tract infection) June 2013  . COPD (chronic obstructive pulmonary disease)    Past Surgical History:  Past Surgical History  Procedure Date  . Femoral bypass   . Esophagogastroduodenoscopy 06/21/2012    Procedure: ESOPHAGOGASTRODUODENOSCOPY (EGD);  Surgeon: Theda Belfast, MD;  Location: [facility];  Service: Endoscopy;   Laterality: N/A;   HPI:  78 year old female who lives in an ALF. She has baseline  exertional dyspnea and has had cough (non-productive) for > 2  weeks which seems to be worse when she is recumbent. She also has  noticed the sensation of difficulty swallowing for the last 2  weeks (of note she has had prior esophageal dilations in past).  Was in usual stat of health until acutely the am of 10/24. States  had even been seen on 10/23 w/ clean bill of health by rounding  NP at ALF. She denies sick exposure, fevers, chills, chest pain,  nausea or vomiting. Has had not LE swelling. No wt gain or loss.  She presented to the ER in acute distress requiring NIPPV.   Presented to ER on 10/24 with acute on chronic dyspnea,  hemoptysis: initially bright red, ~1/4 teaspoon in volume  estimated that she had a total of 6-8 episodes of this before it  started to subside: no more pink tinged. CXR indicates left  greater than right airspace disease.      Assessment / Plan / Recommendation Clinical Impression  Dysphagia Diagnosis: Suspected primary esophageal dysphagia;Mild  oral phase dysphagia;Mild pharyngeal phase dysphagia Clinical impression: Patient continues to present with a  suspected primary esophageal dysphagia with a mild, secondary,  oropharyngeal component. Overall function characterized by  intermittently delayed swallow initiation with larger thin liquid  bolus sizes, when combined with mildly decreased base of tongue  strength, decreased  hyo-laryngeal elevation, excursion, and UES  relaxation, result in trace penetrates which are spontaneously  cleared. Otherwise, full airway protection noted. Spontaneous dry  swallow additonally successful to clear trace-minimal pharyngeal  residuals post swallow. Although combination of esophageal and  pharyngeal components does increase aspiration risk, patient  judged safe to resume a po diet with aspiration precautions at  this time. SLP will f/u closely at bedside for continued  education and diet tolerance.     Treatment Recommendation  Therapy as outlined in treatment plan below    Diet Recommendation Dysphagia 3 (Mechanical Soft);Thin liquid   Liquid Administration via: Cup;Straw Medication Administration: Whole meds with liquid Supervision: Patient able to self feed;Intermittent supervision  to cue for compensatory strategies Compensations: Slow rate;Small sips/bites Postural Changes and/or Swallow Maneuvers: Seated upright 90  degrees;Upright 30-60 min after meal    Other  Recommendations Oral Care Recommendations: Oral care BID   Follow Up Recommendations  None    Frequency and Duration min 2x/week  2 weeks       SLP Swallow Goals Patient will utilize recommended strategies during swallow to  increase swallowing safety with: Independent assistance Swallow Study Goal #2 - Progress: Not met   General HPI: 78 year old female who lives in an ALF. She has  baseline exertional dyspnea and has had cough (non-productive)  for > 2 weeks which seems to be worse when she is recumbent. She  also has noticed the sensation of difficulty swallowing for the  last 2 weeks (of note she has had prior esophageal dilations in  past). Was in usual stat of health until acutely the am of  10/24.  States had even been seen on 10/23 w/ clean bill of health by  rounding NP at ALF. She denies sick exposure, fevers, chills,  chest pain, nausea or vomiting. Has had not LE swelling. No wt  gain or loss. She presented to the ER in acute  distress requiring  NIPPV.  Presented to ER on 10/24 with acute on chronic dyspnea,  hemoptysis: initially bright red, ~1/4 teaspoon in volume  estimated that she had a total of 6-8 episodes of this before it  started to subside: no more pink tinged. CXR indicates left  greater than right airspace disease.  Type of Study: Modified Barium Swallowing Study Reason for Referral: Objectively evaluate swallowing function Previous Swallow Assessment: Bedside swallow evaluation complete  during previous [facility] admission on 06/16/12 and indicated a suspected  primary esophageal dysphagia. Barium swallow complete during that  same admission indicated Marked esophageal dysmotility with  possible diffuse esophageal Diet Prior to this Study: NPO Temperature Spikes Noted: Yes Respiratory Status: Supplemental O2 delivered via (comment) (2 L  nasal cannula) History of Recent Intubation: No Behavior/Cognition: Alert;Cooperative;Pleasant mood Oral Cavity - Dentition: Adequate natural dentition Oral Motor / Sensory Function: Impaired motor Oral impairment: Right lingual (tongue deviates to the right, no  other focal deficits) Self-Feeding Abilities: Able to feed self Patient Positioning: Upright in bed Baseline Vocal Quality: Clear Volitional Cough: Strong Volitional Swallow: Able to elicit Anatomy: Within functional limits Pharyngeal Secretions: Not observed secondary MBS    Reason for Referral Objectively evaluate swallowing function   Oral Phase Oral Preparation/Oral Phase Oral Phase: WFL   Pharyngeal Phase Pharyngeal Phase Pharyngeal Phase: Impaired Pharyngeal - Thin Pharyngeal - Thin Cup: Delayed swallow initiation;Premature  spillage to valleculae;Pharyngeal residue - valleculae;Pharyngeal  residue - pyriform sinuses;Reduced tongue base retraction Pharyngeal - Thin Straw: Delayed swallow initiation;Premature  spillage to pyriform sinuses;Reduced tongue base  retraction;Penetration/Aspiration before swallow;Pharyngeal  residue -  pyriform sinuses;Pharyngeal residue - valleculae Penetration/Aspiration details (thin straw): Material enters  airway, CONTACTS cords then ejected out (spontaneous throat clear  to clear trace penetrates) Pharyngeal - Solids Pharyngeal - Puree: Reduced tongue base retraction;Pharyngeal  residue - valleculae Pharyngeal - Mechanical Soft: Reduced tongue base  retraction;Pharyngeal residue - valleculae Pharyngeal - Pill: Within functional limits (provided whole with  thin liquid)  Cervical Esophageal Phase    GO   Leah McCoy MA, CCC-SLP [phone]  Cervical Esophageal Phase Cervical Esophageal Phase: Impaired Cervical Esophageal Phase - Thin Thin Cup: Reduced cricopharyngeal relaxation Thin Straw: Reduced cricopharyngeal relaxation Cervical Esophageal Phase - Solids Puree: Reduced cricopharyngeal relaxation Mechanical Soft: Reduced cricopharyngeal relaxation Pill: Reduced cricopharyngeal relaxation (mild)         McCoy Leah [name] 10/17/2012, 12:06 PM      Scheduled Meds:   . aspirin  81 mg Oral Daily  . cholecalciferol  1,000 Units Oral Daily  . donepezil  5 mg Oral QHS  . famotidine  20 mg Oral BID  . feeding supplement  237 mL Oral BID BM  . Fluticasone-Salmeterol  1 puff Inhalation Q12H  . imipenem-cilastatin  250 mg Intravenous Q12H  . ipratropium  0.5 mg Nebulization Q6H  . ketorolac  15 mg Intravenous Once  . levalbuterol  0.63 mg Nebulization Q6H  . metoprolol tartrate  12.5 mg Oral TID  . mirtazapine  7.5 mg Oral QHS  . montelukast  10 mg Oral q morning - 10a  . simvastatin  10 mg Oral q1800  . sodium chloride  2 spray Nasal q morning - 10a  .  tetrahydrozoline  1 drop Both Eyes QID  . vancomycin  500 mg Intravenous Q48H   Continuous Infusions:   . dextrose 5 % and 0.45% NaCl 60 mL/hr at 10/17/12 1700    ________________________________________________________________________  Time spent: 35 min    [facility]  [facility] Pager [phone] If 8PM-8AM, please contact  night-coverage at [email or web address], password [facility] 10/17/2012, 5:35 PM  LOS: 1 day

## 2012-10-17 NOTE — Progress Notes (Signed)
PT Cancellation Note  Patient Details Name: Coramae Kellenberger MRN: [id number] DOB: 1922/06/27   Cancelled Treatment:    Reason Eval/Treat Not Completed: Patient not medically ready (Troponin elevated and trending up.) Will follow up tomorrow for attempted initial evaluation. RN aware.   Cephus Shelling 10/17/2012, 9:53 AM  10/17/2012 Cephus Shelling, PT, DPT [phone]

## 2012-10-17 NOTE — Procedures (Signed)
Objective Swallowing Evaluation: Modified Barium Swallowing Study  Patient Details  Name: Lizbett Buchannon MRN: [id number] Date of Birth: [birth year]  Today's Date: 10/17/2012 Time: 4098-1191 SLP Time Calculation (min): 20 min  Past Medical History:  Past Medical History  Diagnosis Date  . Hypertension   . Peripheral arterial disease   . Cognitive disorder   . Esophageal stricture   . Osteoarthritis   . Anxiety   . UTI (lower urinary tract infection) June 2013  . COPD (chronic obstructive pulmonary disease)    Past Surgical History:  Past Surgical History  Procedure Date  . Femoral bypass   . Esophagogastroduodenoscopy 06/21/2012    Procedure: ESOPHAGOGASTRODUODENOSCOPY (EGD);  Surgeon: Theda Belfast, MD;  Location: [facility];  Service: Endoscopy;  Laterality: N/A;   HPI:  78 year old female who lives in an ALF. She has baseline exertional dyspnea and has had cough (non-productive) for > 2 weeks which seems to be worse when she is recumbent. She also has noticed the sensation of difficulty swallowing for the last 2 weeks (of note she has had prior esophageal dilations in past). Was in usual stat of health until acutely the am of 10/24. States had even been seen on 10/23 w/ clean bill of health by rounding NP at ALF. She denies sick exposure, fevers, chills, chest pain, nausea or vomiting. Has had not LE swelling. No wt gain or loss. She presented to the ER in acute distress requiring NIPPV.  Presented to ER on 10/24 with acute on chronic dyspnea, hemoptysis: initially bright red, ~1/4 teaspoon in volume estimated that she had a total of 6-8 episodes of this before it started to subside: no more pink tinged. CXR indicates left greater than right airspace disease.      Assessment / Plan / Recommendation Clinical Impression  Dysphagia Diagnosis: Suspected primary esophageal dysphagia;Mild oral phase dysphagia;Mild pharyngeal phase dysphagia Clinical impression: Patient continues to  present with a suspected primary esophageal dysphagia with a mild, secondary, oropharyngeal component. Overall function characterized by intermittently delayed swallow initiation with larger thin liquid bolus sizes, when combined with mildly decreased base of tongue strength, decreased hyo-laryngeal elevation, excursion, and UES relaxation, result in trace penetrates which are spontaneously cleared. Otherwise, full airway protection noted. Spontaneous dry swallow additonally successful to clear trace-minimal pharyngeal residuals post swallow. Although combination of esophageal and pharyngeal components does increase aspiration risk, patient judged safe to resume a po diet with aspiration precautions at this time. SLP will f/u closely at bedside for continued education and diet tolerance.     Treatment Recommendation  Therapy as outlined in treatment plan below    Diet Recommendation Dysphagia 3 (Mechanical Soft);Thin liquid   Liquid Administration via: Cup;Straw Medication Administration: Whole meds with liquid Supervision: Patient able to self feed;Intermittent supervision to cue for compensatory strategies Compensations: Slow rate;Small sips/bites Postural Changes and/or Swallow Maneuvers: Seated upright 90 degrees;Upright 30-60 min after meal    Other  Recommendations Oral Care Recommendations: Oral care BID   Follow Up Recommendations  None    Frequency and Duration min 2x/week  2 weeks       SLP Swallow Goals Patient will utilize recommended strategies during swallow to increase swallowing safety with: Independent assistance Swallow Study Goal #2 - Progress: Not met   General HPI: 78 year old female who lives in an ALF. She has baseline exertional dyspnea and has had cough (non-productive) for > 2 weeks which seems to be worse when she is recumbent. She also has noticed  the sensation of difficulty swallowing for the last 2 weeks (of note she has had prior esophageal dilations in  past). Was in usual stat of health until acutely the am of 10/24. States had even been seen on 10/23 w/ clean bill of health by rounding NP at ALF. She denies sick exposure, fevers, chills, chest pain, nausea or vomiting. Has had not LE swelling. No wt gain or loss. She presented to the ER in acute distress requiring NIPPV.  Presented to ER on 10/24 with acute on chronic dyspnea, hemoptysis: initially bright red, ~1/4 teaspoon in volume estimated that she had a total of 6-8 episodes of this before it started to subside: no more pink tinged. CXR indicates left greater than right airspace disease.  Type of Study: Modified Barium Swallowing Study Reason for Referral: Objectively evaluate swallowing function Previous Swallow Assessment: Bedside swallow evaluation complete during previous [facility] admission on 06/16/12 and indicated a suspected primary esophageal dysphagia. Barium swallow complete during that same admission indicated Marked esophageal dysmotility with possible diffuse esophageal Diet Prior to this Study: NPO Temperature Spikes Noted: Yes Respiratory Status: Supplemental O2 delivered via (comment) (2 L nasal cannula) History of Recent Intubation: No Behavior/Cognition: Alert;Cooperative;Pleasant mood Oral Cavity - Dentition: Adequate natural dentition Oral Motor / Sensory Function: Impaired motor Oral impairment: Right lingual (tongue deviates to the right, no other focal deficits) Self-Feeding Abilities: Able to feed self Patient Positioning: Upright in bed Baseline Vocal Quality: Clear Volitional Cough: Strong Volitional Swallow: Able to elicit Anatomy: Within functional limits Pharyngeal Secretions: Not observed secondary MBS    Reason for Referral Objectively evaluate swallowing function   Oral Phase Oral Preparation/Oral Phase Oral Phase: WFL   Pharyngeal Phase Pharyngeal Phase Pharyngeal Phase: Impaired Pharyngeal - Thin Pharyngeal - Thin Cup: Delayed swallow initiation;Premature  spillage to valleculae;Pharyngeal residue - valleculae;Pharyngeal residue - pyriform sinuses;Reduced tongue base retraction Pharyngeal - Thin Straw: Delayed swallow initiation;Premature spillage to pyriform sinuses;Reduced tongue base retraction;Penetration/Aspiration before swallow;Pharyngeal residue - pyriform sinuses;Pharyngeal residue - valleculae Penetration/Aspiration details (thin straw): Material enters airway, CONTACTS cords then ejected out (spontaneous throat clear to clear trace penetrates) Pharyngeal - Solids Pharyngeal - Puree: Reduced tongue base retraction;Pharyngeal residue - valleculae Pharyngeal - Mechanical Soft: Reduced tongue base retraction;Pharyngeal residue - valleculae Pharyngeal - Pill: Within functional limits (provided whole with thin liquid)  Cervical Esophageal Phase    GO   Vernida Mcnicholas MA, CCC-SLP [phone]  Cervical Esophageal Phase Cervical Esophageal Phase: Impaired Cervical Esophageal Phase - Thin Thin Cup: Reduced cricopharyngeal relaxation Thin Straw: Reduced cricopharyngeal relaxation Cervical Esophageal Phase - Solids Puree: Reduced cricopharyngeal relaxation Mechanical Soft: Reduced cricopharyngeal relaxation Pill: Reduced cricopharyngeal relaxation (mild)         Treyvin Glidden [name] 10/17/2012, 12:06 PM

## 2012-10-17 NOTE — Progress Notes (Signed)
Patient Name: [name] Date of Encounter: 10/17/2012  Principal Problem:  *Acute hypoxic respiratory failure  Active Problems:  Peripheral arterial disease  Healthcare-associated pneumonia  Elevated troponin  Hemoptysis    SUBJECTIVE: No chest pain, pt had some back pain and HA last pm, better after Rx. Breathing a little better today.  OBJECTIVE Filed Vitals:   10/17/12 0000 10/17/12 0030 10/17/12 0140 10/17/12 0400  BP: 106/36   115/44  Pulse: 101   89  Temp:  99 F (37.2 C)  97.7 F (36.5 C)  TempSrc: Oral   Oral  Resp: 27   26  Height:      Weight:      SpO2: 96%  96% 97%    Intake/Output Summary (Last 24 hours) at 10/17/12 0716 Last data filed at 10/17/12 0600  Gross per 24 hour  Intake   1390 ml  Output    251 ml  Net   1139 ml   Filed Weights   10/16/12 1345  Weight: 92 lb 9.5 oz (42 kg)   PHYSICAL EXAM General: Well developed, well nourished, elderly female in no acute distress. Head: Normocephalic, atraumatic.  Neck: Supple without bruits, JVD at 6-8 cm. Lungs:  Resp regular and unlabored, dense rales bases, left > right. Heart: RRR, S1, S2, no S3, S4, or murmur. Abdomen: Soft, non-tender, non-distended, BS + x 4.  Extremities: No clubbing, cyanosis, no edema.  Neuro: Alert and oriented X 3. Moves all extremities spontaneously. Psych: Normal affect.  LABS: CBC: Basename 10/17/12 0540 10/16/12 0835  WBC 9.1 16.3*  NEUTROABS -- --  HGB 10.6* 15.5*  HCT 33.4* 46.8*  MCV 87.9 88.8  PLT 152 299   INR: Basename 10/16/12 0835  INR 1.06   Basic Metabolic Panel: Basename 10/17/12 0540 10/16/12 0835  NA 137 137  K 4.4 4.7  CL 105 102  CO2 22 23  GLUCOSE 112* 199*  BUN 30* 21  CREATININE 1.81* 1.38*  CALCIUM 8.6 9.2  MG -- --  PHOS -- --   Liver Function Tests: [facility] 10/16/12 0835  AST 24  ALT 11  ALKPHOS 86  BILITOT 0.3  PROT 7.2  ALBUMIN 3.3*   Cardiac Enzymes: Basename 10/17/12 0021 10/16/12 1830 10/16/12 1824  10/16/12 1452  CKTOTAL 93 117 -- 94  CKMB 6.8* 8.8* -- 9.0*  CKMBINDEX -- 7.5 -- --  TROPONINI 1.87* -- 1.04* 1.62*   BNP: Pro B Natriuretic peptide (BNP)  Date/Time Value Range Status  10/16/2012  8:36 AM 9826.0* 0 - 450 pg/mL Final   Fasting Lipid Panel:No results found for this basename: CHOL,HDL,LDLCALC,TRIG,CHOLHDL,LDLDIRECT in the last 72 hours Thyroid Function Tests: [facility] 10/16/12 1824  TSH 1.520  T4TOTAL --  T3FREE --  THYROIDAB --   TELE:   SR, ST     ECHO: 10/16/2012  Tricuspid valve:   Structurally normal valve.    Doppler: Transvalvular velocity was within the normal range.  Mild regurgitation.    Peak gradient: 52mm Hg (D). Systemic veins - Estimated  CVP 10 mm Hg Study Conclusions - Left ventricle: The cavity size was normal. Wall thickness   was increased in a pattern of moderate LVH. Systolic   function was mildly to moderately reduced. The estimated   ejection fraction was in the range of 40% to 45%. Moderate   hypokinesis of the mid-distalanteroseptal, anterior, and   apical myocardium. Doppler parameters are consistent with   abnormal left ventricular relaxation (grade 1 diastolic dysfunction). - Left atrium: The  atrium was mildly dilated. - Pulmonary arteries: Systolic pressure was moderately increased.   Radiology/Studies: Ct Angio Chest Pe W/cm &/or Wo Cm 10/16/2012  *RADIOLOGY REPORT*  Clinical Data: 78 year old female with peripheral artery disease. Hypertension.  Acute respiratory distress and hemoptysis.  CT ANGIOGRAPHY CHEST  Technique:  Multidetector CT imaging of the chest using the standard protocol during bolus administration of intravenous contrast. Multiplanar reconstructed images including MIPs were obtained and reviewed to evaluate the vascular anatomy.  Contrast: 80mL OMNIPAQUE IOHEXOL 350 MG/ML SOLN  Comparison: Portable chest radiograph the same day and earlier.  Findings: Good contrast bolus timing in the pulmonary arterial tree.   Mild respiratory motion artifact. No focal filling defect identified in the pulmonary arterial tree to suggest the presence of acute pulmonary embolism.  Extensive left greater than right lower lobe pulmonary consolidation.  Within the consolidated lung, typically on the left enhancing pulmonary vasculature is noted.  No definite areas of contrast extravasation within the lungs are identified.  Scattered areas of gas within the consolidated lung on the left, some suspicious for early cavitary change, but no definite areas of pulmonary necrosis.  The right lower lobe is significantly less affected.  Underlying centrilobular emphysema.  Major airways are patent.  Cardiomegaly.  No pericardial effusion.  Only trace pleural effusions.  Negative visualized upper abdominal viscera.  No mediastinal lymphadenopathy.  Intermittent gaseous distention of the thoracic esophagus.  Negative visualized thoracic inlet.  No acute osseous abnormality identified.  IMPRESSION: 1. No evidence of acute pulmonary embolus.  No definite pulmonary vascular contrast extravasation identified in the abnormal left lung, see #2.  2.  Extensive left lower lobe consolidation.  Severe pneumonia or aspiration could have this appearance.  Necrotizing pneumonia cannot be excluded.  3.  Comparatively mild right lower lobe consolidation.  Trace bilateral pleural effusions.  4.  Underlying emphysema.  Cardiomegaly.   Original Report Authenticated By: Harley Hallmark, M.D.    Dg Chest Portable 1 View 10/16/2012  *RADIOLOGY REPORT*  Clinical Data: Shortness of breath, respiratory distress  PORTABLE CHEST - 1 VIEW  Comparison: Portable exam 0857 hours compared to 06/13/2012  Findings: Minimal enlargement of cardiac silhouette. Mediastinal contours normal. Extensive airspace infiltrate identified in lingula and left lower lobe consistent with pneumonia. Aspiration not entirely excluded. Underlying emphysematous changes. Probable mild infiltrate at right  base as well. No gross pleural effusion or pneumothorax. Diffuse osseous demineralization.  IMPRESSION: Extensive airspace consolidation involving the lingula and left lower lobe consistent with pneumonia though aspiration not excluded. Minimal similar right basilar infiltrate.   Original Report Authenticated By: Lollie Marrow, M.D.     Current Medications:  . cholecalciferol  1,000 Units Oral Daily  . donepezil  5 mg Oral QHS  . famotidine  20 mg Oral BID  . feeding supplement  237 mL Oral BID BM  . Fluticasone-Salmeterol  1 puff Inhalation Q12H  . imipenem-cilastatin  250 mg Intravenous Q12H  . ipratropium  0.5 mg Nebulization Q6H  . ipratropium  1 mg Nebulization Once  . ketorolac  15 mg Intravenous Once  . levalbuterol  0.63 mg Nebulization Q6H  . mirtazapine  7.5 mg Oral QHS  . montelukast  10 mg Oral q morning - 10a  . ondansetron (ZOFRAN) IV  4 mg Intravenous Once  . simvastatin  10 mg Oral q1800  . sodium chloride  2 spray Nasal q morning - 10a  . vancomycin  500 mg Intravenous Q48H  . vancomycin  1,000 mg Intravenous Once      .  dextrose 5 % and 0.45% NaCl 60 mL/hr at 10/17/12 0600  . DISCONTD: albuterol 10 mg (10/16/12 0949)    ASSESSMENT AND PLAN: Principal Problem:  *Acute hypoxic respiratory failure - mgt per primary MD/CCM. By CT and by CVP, no significant edema/volume overload despite elevated BNP, follow.  Active Problems: NSTEMI/ Elevated troponin - possibly type 2 in the setting of PNA/resp failure. Add ASA, no BB right now with Hx COPD, and current resp problems. Check lipids, she is on a statin. Her EF is decreased, not sure if acute or chronic, no ACE with ARI (worse today after dye load). No cath planned for now, no Hx chest pain   Healthcare-associated pneumonia/ Hemoptysis - see #1   Peripheral arterial disease - no acute Sx, follow  Signed, Theodore Demark , PA-C 7:16 AM 10/17/2012  Patient seen and examined.  Agree with findings of R barrett Most  likely troponin bump represents demand ischemia  Patient most likely has CAD at her age and with mild depressed LVEF But, I would recomm conservative/medical Rx. On exam:  JVP is normal.  LUngs rhonchi and Rales worse on L  Cardiac:  RRR.  No S3.  Ext:  No edema  I would continue current regimen.  With Cr bump would keep on IV fluids for now.  May indeed need diuresis but would follow Cr. (Had CT)   Would add very low dose b blocker (12.5 tid lopressor) to slow HR, improve diastolic filling  Dietrich Pates  Follow BP.

## 2012-10-18 DIAGNOSIS — J449 Chronic obstructive pulmonary disease, unspecified: Secondary | ICD-10-CM

## 2012-10-18 DIAGNOSIS — J189 Pneumonia, unspecified organism: Secondary | ICD-10-CM

## 2012-10-18 DIAGNOSIS — J9600 Acute respiratory failure, unspecified whether with hypoxia or hypercapnia: Secondary | ICD-10-CM

## 2012-10-18 DIAGNOSIS — R042 Hemoptysis: Secondary | ICD-10-CM

## 2012-10-18 LAB — BASIC METABOLIC PANEL
BUN: 28 mg/dL — ABNORMAL HIGH (ref 6–23)
Calcium: 8.6 mg/dL (ref 8.4–10.5)
Creatinine, Ser: 1.57 mg/dL — ABNORMAL HIGH (ref 0.50–1.10)
GFR calc non Af Amer: 28 mL/min — ABNORMAL LOW (ref >90)
Glucose, Bld: 113 mg/dL — ABNORMAL HIGH (ref 70–99)
Sodium: 138 mEq/L (ref 135–145)

## 2012-10-18 MED ORDER — CLOPIDOGREL BISULFATE 75 MG PO TABS
75.0000 mg | ORAL_TABLET | Freq: Every day | ORAL | Status: DC
  Filled 2012-10-18: qty 1

## 2012-10-18 MED ORDER — DEXTROMETHORPHAN POLISTIREX 30 MG/5ML PO LQCR
30.0000 mg | Freq: Two times a day (BID) | ORAL | Status: DC | PRN
  Administered 2012-10-18: 30 mg via ORAL
  Filled 2012-10-18 (×2): qty 5

## 2012-10-18 MED ORDER — TRAMADOL HCL 50 MG PO TABS
50.0000 mg | ORAL_TABLET | Freq: Four times a day (QID) | ORAL | Status: DC | PRN
  Administered 2012-10-18 – 2012-10-19 (×3): 50 mg via ORAL
  Filled 2012-10-18 (×2): qty 1
  Filled 2012-10-18: qty 2

## 2012-10-18 MED ORDER — METOPROLOL TARTRATE 25 MG PO TABS
25.0000 mg | ORAL_TABLET | Freq: Two times a day (BID) | ORAL | Status: DC
  Administered 2012-10-18 – 2012-10-24 (×12): 25 mg via ORAL
  Filled 2012-10-18 (×13): qty 1

## 2012-10-18 MED ORDER — GUAIFENESIN ER 600 MG PO TB12
600.0000 mg | ORAL_TABLET | Freq: Two times a day (BID) | ORAL | Status: DC
  Administered 2012-10-18 – 2012-10-24 (×14): 600 mg via ORAL
  Filled 2012-10-18 (×14): qty 1

## 2012-10-18 NOTE — Plan of Care (Signed)
Problem: Phase II Progression Outcomes Goal: Progress activity as tolerated unless otherwise ordered Outcome: Progressing OOB to chair     

## 2012-10-18 NOTE — Progress Notes (Signed)
Physical Therapy Evaluation Patient Details Name: [name] MRN: [id number] DOB: 04-30-1922 Today's Date: 10/18/2012 Time: 1205-1228 PT Time Calculation (min): 23 min  PT Assessment / Plan / Recommendation Clinical Impression  Pt is 78 yo female with PNA and respiratory failure who is desaturating with mobility when off oxygen, may need O2 to go home.  PT will follow acutely for increasing mobility, safety, and independence. Recommend follow up with PT at ALF after d/c.    PT Assessment  Patient needs continued PT services    Follow Up Recommendations  Home health PT;Supervision/Assistance - 24 hour    Does the patient have the potential to tolerate intense rehabilitation      Barriers to Discharge None      Equipment Recommendations  None recommended by PT    Recommendations for Other Services OT consult   Frequency Min 3X/week    Precautions / Restrictions Precautions Precautions: Fall;Other (comment) (watch O2 sats) Restrictions Weight Bearing Restrictions: No   Pertinent Vitals/Pain O2 sats decreased to 79% on RA with ambulation, returned to 92% on 2L after treatment      Mobility  Bed Mobility Bed Mobility: Not assessed (pt in chair) Transfers Transfers: Sit to Stand;Stand to Sit Sit to Stand: 4: Min assist;From chair/3-in-1;With upper extremity assist Stand to Sit: 4: Min assist;To chair/3-in-1;With upper extremity assist Details for Transfer Assistance: min A to steady, pt with LOB with initial standing but increased steadiness as she was up Ambulation/Gait Ambulation/Gait Assistance: 4: Min assist Ambulation Distance (Feet): 100 Feet Assistive device: Rolling walker Ambulation/Gait Assistance Details: pt desat down to 79% on RA, when made to stop talking and stop walking and focus on breathing, improved to 89%, short shuffling steps , vc's for upright posture and looking ahead instead of at feet Gait Pattern: Step-through pattern;Decreased stride  length;Shuffle;Trunk flexed Gait velocity: decreased Stairs: No Wheelchair Mobility Wheelchair Mobility: No    Shoulder Instructions     Exercises General Exercises - Lower Extremity Ankle Circles/Pumps: AROM;Both;10 reps;Seated Hip ABduction/ADduction: AROM;Both;10 reps;Seated   PT Diagnosis: Difficulty walking;Generalized weakness  PT Problem List: Decreased strength;Decreased activity tolerance;Decreased balance;Decreased mobility;Decreased cognition;Cardiopulmonary status limiting activity PT Treatment Interventions: DME instruction;Gait training;Functional mobility training;Therapeutic activities;Therapeutic exercise;Balance training;Patient/family education   PT Goals Acute Rehab PT Goals PT Goal Formulation: With patient/family Time For Goal Achievement: 11/01/12 Potential to Achieve Goals: Good Pt will go Supine/Side to Sit: with modified independence PT Goal: Supine/Side to Sit - Progress: Goal set today Pt will go Sit to Stand: with modified independence PT Goal: Sit to Stand - Progress: Goal set today Pt will go Stand to Sit: with modified independence PT Goal: Stand to Sit - Progress: Goal set today Pt will Ambulate: >150 feet;with rolling walker;with supervision;Other (comment) (with O2 sats >88%) PT Goal: Ambulate - Progress: Goal set today  Visit Information  Last PT Received On: 10/18/12 Assistance Needed: +1    Subjective Data  Subjective: I like to stay active Patient Stated Goal: return to [facility]   Prior Functioning  Home Living Lives With: Other (Comment) (ALF staff, husband there in memory care) Available Help at Discharge: Other (Comment) (ALF) Type of Home: Assisted living Home Access: Level entry Home Layout: One level Home Adaptive Equipment: Walker - rolling Additional Comments: pt's daughter reports that she does not usually use her RW Prior Function Level of Independence: Independent with assistive device(s) Able to Take Stairs?:   (N/A) Driving: No Vocation: Retired Musician: No difficulties    Cognition  Overall Cognitive  Status: History of cognitive impairments - at baseline Arousal/Alertness: Awake/alert Orientation Level: Appears intact for tasks assessed Behavior During Session: [facility] for tasks performed Cognition - Other Comments: Pt with memory deficits at baseline but appropriate throughout eval    Extremity/Trunk Assessment Right Lower Extremity Assessment RLE ROM/Strength/Tone: Deficits RLE ROM/Strength/Tone Deficits: grossly 3+/5 throughout bilateral LE RLE Sensation: WFL - Light Touch RLE Coordination: WFL - gross motor Left Lower Extremity Assessment LLE ROM/Strength/Tone: Deficits LLE ROM/Strength/Tone Deficits: same as RLE LLE Sensation: WFL - Light Touch LLE Coordination: WFL - gross motor Trunk Assessment Trunk Assessment: Kyphotic   Balance Balance Balance Assessed: Yes Dynamic Standing Balance Dynamic Standing - Balance Support: Left upper extremity supported;During functional activity Dynamic Standing - Level of Assistance: 4: Min assist  End of Session PT - End of Session Equipment Utilized During Treatment: Gait belt Activity Tolerance: Other (comment) (limited by cardiopulmonary status) Patient left: in chair;with call bell/phone within reach;with family/visitor present Nurse Communication: Mobility status  GP   Lyanne Co, PT  Acute Rehab Services  [phone]   Lyanne Co 10/18/2012, 1:33 PM

## 2012-10-18 NOTE — Progress Notes (Signed)
Patient Name: [name]      SUBJECTIVE: admitted with SOB with hx of chronic DOE X months assoc with +Tn---intially presumed type 2 and elevated BNP;  Found to have necrotizing pneumonia and hemoptysis  Feeling better but some cough and minimal hemoptysis  Past Medical History  Diagnosis Date  . Hypertension   . Peripheral arterial disease   . Cognitive disorder   . Esophageal stricture   . Osteoarthritis   . Anxiety   . UTI (lower urinary tract infection) June 2013  . COPD (chronic obstructive pulmonary disease)     PHYSICAL EXAM Filed Vitals:   10/18/12 0331 10/18/12 0400 10/18/12 0805 10/18/12 0826  BP:   134/61   Pulse:  84 82   Temp: 99 F (37.2 C)   98 F (36.7 C)  TempSrc: Oral   Oral  Resp:  26 26   Height:      Weight:      SpO2:  96% 99% 98%    General appearance: alert, cooperative and mild distress Neck: JVD - <10 cm above sternal notch Lungs: decresased breath sounds L>R Heart: regular rate and rhythm, S1, S2 normal and systolic murmur: early systolic 2/6,  at 2nd right intercostal space Abdomen: soft, non-tender; bowel sounds normal; no masses,  no organomegaly Extremities: edema none Skin: Skin color, texture, turgor normal. No rashes or lesions Neurologic: Grossly normal  TELEMETRY: Reviewed telemetry pt in nsr:    Intake/Output Summary (Last 24 hours) at 10/18/12 1105 Last data filed at 10/18/12 1017  Gross per 24 hour  Intake   1700 ml  Output      2 ml  Net   1698 ml    LABS: Basic Metabolic Panel:  Lab 10/18/12 1610 10/17/12 1747 10/17/12 0540 10/16/12 0835  NA 138 133* 137 137  K 4.3 4.5 4.4 4.7  CL 108 102 105 102  CO2 23 20 22 23   GLUCOSE 113* 153* 112* 199*  BUN 28* 30* 30* 21  CREATININE 1.57* 1.50* 1.81* 1.38*  CALCIUM 8.6 8.9 -- --  MG -- -- -- --  PHOS -- -- -- --   Cardiac Enzymes:  Basename 10/17/12 0021 10/16/12 1830 10/16/12 1824 10/16/12 1452  CKTOTAL 93 117 -- 94  CKMB 6.8* 8.8* -- 9.0*  CKMBINDEX --  -- -- --  TROPONINI 1.87* -- 1.04* 1.62*   CBC:  Lab 10/17/12 0540 10/16/12 0835  WBC 9.1 16.3*  NEUTROABS -- --  HGB 10.6* 15.5*  HCT 33.4* 46.8*  MCV 87.9 88.8  PLT 152 299   PROTIME:  Basename 10/16/12 0835  LABPROT 13.7  INR 1.06   Liver Function Tests:  Basename 10/16/12 0835  AST 24  ALT 11  ALKPHOS 86  BILITOT 0.3  PROT 7.2  ALBUMIN 3.3*   No results found for this basename: LIPASE:2,AMYLASE:2 in the last 72 hours BNP: BNP (last 3 results)  Basename 10/16/12 0836  PROBNP 9826.0*   D-Dimer: No results found for this basename: DDIMER:2 in the last 72 hours Hemoglobin A1C: No results found for this basename: HGBA1C in the last 72 hours Fasting Lipid Panel:  Basename 10/17/12 0540  CHOL 98  HDL 56  LDLCALC 27  TRIG 73  CHOLHDL 1.8  LDLDIRECT --   Thyroid Function Tests:  Basename 10/16/12 1824  TSH 1.520  T4TOTAL --  T3FREE --  THYROIDAB --   Anemia Panel: No results found for this basename: VITAMINB12,FOLATE,FERRITIN,TIBC,IRON,RETICCTPCT in the last 72 hours  ECHO   EF  40-45% with moderate LVH and wma    ASSESSMENT AND PLAN:  Patient Active Hospital Problem List: Acute hypoxic respiratory failure  (10/16/2012)  Healthcare-associated pneumonia (10/16/2012)   Elevated troponin  Poss NSTEMI concerning with 2 bump and multiple WMA on echo   Systolic and diastolic CHF, acute on chronic (10/17/2012)   Resume plavix   increase beta blocker  Signed, Sherryl Manges MD  10/18/2012

## 2012-10-18 NOTE — Progress Notes (Signed)
[facility] PROGRESS NOTE  [name] ZOX:[id number] DOB: 12/17/1922 DOA: 10/16/2012 PCP: [name], MD  Assessment/Plan: Principal Problem:  *Acute hypoxic respiratory failure  Due to HCAP of LLL and CHF exacerbation- improving- now has more cough and is producing sputum   Active Problems:   Healthcare-associated pneumonia- necrotizing Cont coverage with Vanc and Primaxin for a minimum of 5 days prior to switching to PO.  Sputum culture ordered.  Pt continues to do well on 2 L of O2.   Elevated troponin Due to demand ischemia, cardiology following. Plavix resumed today by Dr Graciela Husbands.    Hemoptysis Due to pneumonia-Still having mind hemoptysis- as above, cardiology has resumed Plavix.    Systolic and diastolic CHF, acute on chronic Has been diuresed for mild-mod pulm edema- Lasix d/c'd   Peripheral arterial disease Plavix resumed.   Severe right shoulder pain Son and daughter state that this is not unusual for her and is likely due to degenerative arthritis- I have ordered tramadol which is what she has taken in the past for it. She actually feels relief from the tylenol that was given to her earlier- she is requesting a heating pad which I have ordered.   Code Status: DNr Disposition Plan: follow in SDU DVT prophylaxis: SCDs   Brief narrative: Patient is a 78 year old female with history of peripheral arterial disease, hypertension, osteoarthritis, history of a esophageal stricture status post dilatation in June 2013 was sent from an assisted living facility. She states that she has had a "hacking cough" for 2 weeks but didn't think much of it. She began to cough up blood and therefore was sent to the ER and placed upon Bipap upon arrival.   Consultants:  Cardiology  Pulmonary   Procedures:  none  Antibiotics:  Vancomycin- 10/24  Primaxin- 10/24  HPI/Subjective: Pt alert, complains of severe right shoulder pain with kept her awake at night. She has  gotten out of bed and taken tylenol - these 2 measures have helped resolve the pain. She is coughing up more mucous now- it is slightly blood tinged.   Objective: Filed Vitals:   10/18/12 1211 10/18/12 1215 10/18/12 1225 10/18/12 1552  BP:    153/80  Pulse:    88  Temp: 97.7 F (36.5 C)   97.8 F (36.6 C)  TempSrc: Oral   Oral  Resp:    27  Height:      Weight:      SpO2:  79% 92% 95%    Intake/Output Summary (Last 24 hours) at 10/18/12 1654 Last data filed at 10/18/12 1552  Gross per 24 hour  Intake   1700 ml  Output      2 ml  Net   1698 ml    Exam:   General:  Alert, no distress  Cardiovascular: RRR, no murmur  Respiratory: crackles in LLL  Abdomen: soft, NT, ND, BS+  Ext: no c/c/e  Data Reviewed: Basic Metabolic Panel:  Lab 10/18/12 8119 10/17/12 1747 10/17/12 0540 10/16/12 0835  NA 138 133* 137 137  K 4.3 4.5 4.4 4.7  CL 108 102 105 102  CO2 23 20 22 23   GLUCOSE 113* 153* 112* 199*  BUN 28* 30* 30* 21  CREATININE 1.57* 1.50* 1.81* 1.38*  CALCIUM 8.6 8.9 8.6 9.2  MG -- -- -- --  PHOS -- -- -- --   Liver Function Tests:  Lab 10/16/12 0835  AST 24  ALT 11  ALKPHOS 86  BILITOT 0.3  PROT 7.2  ALBUMIN 3.3*   No results found for this basename: LIPASE:5,AMYLASE:5 in the last 168 hours No results found for this basename: AMMONIA:5 in the last 168 hours CBC:  Lab 10/17/12 0540 10/16/12 0835  WBC 9.1 16.3*  NEUTROABS -- --  HGB 10.6* 15.5*  HCT 33.4* 46.8*  MCV 87.9 88.8  PLT 152 299   Cardiac Enzymes:  Lab 10/17/12 0021 10/16/12 1830 10/16/12 1824 10/16/12 1452 10/16/12 1255 10/16/12 0836  CKTOTAL 93 117 -- 94 83 --  CKMB 6.8* 8.8* -- 9.0* 7.7* --  CKMBINDEX -- -- -- -- -- --  TROPONINI 1.87* -- 1.04* 1.62* 1.66* 0.31*   BNP (last 3 results)  Basename 10/16/12 0836  PROBNP 9826.0*   CBG: No results found for this basename: GLUCAP:5 in the last 168 hours  Recent Results (from the past 240 hour(s))  CULTURE, BLOOD (ROUTINE X 2)      Status: Normal (Preliminary result)   Collection Time   10/16/12  9:40 AM      Component Value Range Status Comment   Specimen Description BLOOD LEFT RADIAL   Final    Special Requests BOTTLES DRAWN AEROBIC ONLY 6CC   Final    Culture  Setup Time 10/16/2012 15:37   Final    Culture     Final    Value:        BLOOD CULTURE RECEIVED NO GROWTH TO DATE CULTURE WILL BE HELD FOR 5 DAYS BEFORE ISSUING A FINAL NEGATIVE REPORT   Report Status PENDING   Incomplete   CULTURE, BLOOD (ROUTINE X 2)     Status: Normal (Preliminary result)   Collection Time   10/16/12  9:50 AM      Component Value Range Status Comment   Specimen Description BLOOD ARM LEFT   Final    Special Requests     Final    Value: BOTTLES DRAWN AEROBIC AND ANAEROBIC 10CC AER 3CC ANA   Culture  Setup Time 10/16/2012 15:37   Final    Culture     Final    Value:        BLOOD CULTURE RECEIVED NO GROWTH TO DATE CULTURE WILL BE HELD FOR 5 DAYS BEFORE ISSUING A FINAL NEGATIVE REPORT   Report Status PENDING   Incomplete   MRSA PCR SCREENING     Status: Normal   Collection Time   10/16/12  1:55 PM      Component Value Range Status Comment   MRSA by PCR NEGATIVE  NEGATIVE Final   URINE CULTURE     Status: Normal   Collection Time   10/16/12  4:32 PM      Component Value Range Status Comment   Specimen Description URINE, CLEAN CATCH   Final    Special Requests Normal   Final    Culture  Setup Time 10/16/2012 17:23   Final    Colony Count NO GROWTH   Final    Culture NO GROWTH   Final    Report Status 10/17/2012 FINAL   Final   CULTURE, EXPECTORATED SPUTUM-ASSESSMENT     Status: Normal   Collection Time   10/17/12  6:32 AM      Component Value Range Status Comment   Specimen Description SPUTUM   Final    Special Requests Normal   Final    Sputum evaluation     Final    Value: MICROSCOPIC FINDINGS SUGGEST THAT THIS SPECIMEN IS NOT REPRESENTATIVE OF LOWER RESPIRATORY SECRETIONS. PLEASE RECOLLECT.  Gram Stain Report Called  to,Read Back By and Verified With: B BRADT,RN AT 0802 10/17/12 BY K BARR   Report Status 10/17/2012 FINAL   Final      Studies: Ct Angio Chest Pe W/cm &/or Wo Cm  10/16/2012  *RADIOLOGY REPORT*  Clinical Data: 78 year old female with peripheral artery disease. Hypertension.  Acute respiratory distress and hemoptysis.  CT ANGIOGRAPHY CHEST  Technique:  Multidetector CT imaging of the chest using the standard protocol during bolus administration of intravenous contrast. Multiplanar reconstructed images including MIPs were obtained and reviewed to evaluate the vascular anatomy.  Contrast: 80mL OMNIPAQUE IOHEXOL 350 MG/ML SOLN  Comparison: Portable chest radiograph the same day and earlier.  Findings: Good contrast bolus timing in the pulmonary arterial tree.  Mild respiratory motion artifact. No focal filling defect identified in the pulmonary arterial tree to suggest the presence of acute pulmonary embolism.  Extensive left greater than right lower lobe pulmonary consolidation.  Within the consolidated lung, typically on the left enhancing pulmonary vasculature is noted.  No definite areas of contrast extravasation within the lungs are identified.  Scattered areas of gas within the consolidated lung on the left, some suspicious for early cavitary change, but no definite areas of pulmonary necrosis.  The right lower lobe is significantly less affected.  Underlying centrilobular emphysema.  Major airways are patent.  Cardiomegaly.  No pericardial effusion.  Only trace pleural effusions.  Negative visualized upper abdominal viscera.  No mediastinal lymphadenopathy.  Intermittent gaseous distention of the thoracic esophagus.  Negative visualized thoracic inlet.  No acute osseous abnormality identified.  IMPRESSION: 1. No evidence of acute pulmonary embolus.  No definite pulmonary vascular contrast extravasation identified in the abnormal left lung, see #2.  2.  Extensive left lower lobe consolidation.  Severe  pneumonia or aspiration could have this appearance.  Necrotizing pneumonia cannot be excluded.  3.  Comparatively mild right lower lobe consolidation.  Trace bilateral pleural effusions.  4.  Underlying emphysema.  Cardiomegaly.   Original Report Authenticated By: Harley Hallmark, M.D.    Dg Chest Port 1 View  10/17/2012  *RADIOLOGY REPORT*  Clinical Data: Pneumonia  PORTABLE CHEST - 1 VIEW  Comparison: 10/16/2012  Findings: Extensive airspace disease within the left mid and lower lung zones has improved.  Cardiomegaly persist.  No pneumothorax. Stable appearance of the right lung with scattered scarring verse atelectasis.  IMPRESSION: Improving left lower lobe pneumonia.   Original Report Authenticated By: Donavan Burnet, M.D.    Dg Chest Portable 1 View  10/16/2012  *RADIOLOGY REPORT*  Clinical Data: Shortness of breath, respiratory distress  PORTABLE CHEST - 1 VIEW  Comparison: Portable exam 0857 hours compared to 06/13/2012  Findings: Minimal enlargement of cardiac silhouette. Mediastinal contours normal. Extensive airspace infiltrate identified in lingula and left lower lobe consistent with pneumonia. Aspiration not entirely excluded. Underlying emphysematous changes. Probable mild infiltrate at right base as well. No gross pleural effusion or pneumothorax. Diffuse osseous demineralization.  IMPRESSION: Extensive airspace consolidation involving the lingula and left lower lobe consistent with pneumonia though aspiration not excluded. Minimal similar right basilar infiltrate.   Original Report Authenticated By: Lollie Marrow, M.D.    Dg Swallowing Func-speech Pathology  10/17/2012  Vivi Ferns McCoy, CCC-SLP     10/17/2012 12:07 PM Objective Swallowing Evaluation: Modified Barium Swallowing Study   Patient Details  Name: [name] MRN: [id number] Date of Birth: [birth year]  Today's Date: 10/17/2012 Time: 4098-1191 SLP Time Calculation (min): 20 min  Past Medical History:  Past Medical History  Diagnosis  Date  . Hypertension   . Peripheral arterial disease   . Cognitive disorder   . Esophageal stricture   . Osteoarthritis   . Anxiety   . UTI (lower urinary tract infection) June 2013  . COPD (chronic obstructive pulmonary disease)    Past Surgical History:  Past Surgical History  Procedure Date  . Femoral bypass   . Esophagogastroduodenoscopy 06/21/2012    Procedure: ESOPHAGOGASTRODUODENOSCOPY (EGD);  Surgeon: Theda Belfast, MD;  Location: [facility];  Service: Endoscopy;   Laterality: N/A;   HPI:  78 year old female who lives in an ALF. She has baseline  exertional dyspnea and has had cough (non-productive) for > 2  weeks which seems to be worse when she is recumbent. She also has  noticed the sensation of difficulty swallowing for the last 2  weeks (of note she has had prior esophageal dilations in past).  Was in usual stat of health until acutely the am of 10/24. States  had even been seen on 10/23 w/ clean bill of health by rounding  NP at ALF. She denies sick exposure, fevers, chills, chest pain,  nausea or vomiting. Has had not LE swelling. No wt gain or loss.  She presented to the ER in acute distress requiring NIPPV.   Presented to ER on 10/24 with acute on chronic dyspnea,  hemoptysis: initially bright red, ~1/4 teaspoon in volume  estimated that she had a total of 6-8 episodes of this before it  started to subside: no more pink tinged. CXR indicates left  greater than right airspace disease.      Assessment / Plan / Recommendation Clinical Impression  Dysphagia Diagnosis: Suspected primary esophageal dysphagia;Mild  oral phase dysphagia;Mild pharyngeal phase dysphagia Clinical impression: Patient continues to present with a  suspected primary esophageal dysphagia with a mild, secondary,  oropharyngeal component. Overall function characterized by  intermittently delayed swallow initiation with larger thin liquid  bolus sizes, when combined with mildly decreased base of tongue  strength, decreased  hyo-laryngeal elevation, excursion, and UES  relaxation, result in trace penetrates which are spontaneously  cleared. Otherwise, full airway protection noted. Spontaneous dry  swallow additonally successful to clear trace-minimal pharyngeal  residuals post swallow. Although combination of esophageal and  pharyngeal components does increase aspiration risk, patient  judged safe to resume a po diet with aspiration precautions at  this time. SLP will f/u closely at bedside for continued  education and diet tolerance.     Treatment Recommendation  Therapy as outlined in treatment plan below    Diet Recommendation Dysphagia 3 (Mechanical Soft);Thin liquid   Liquid Administration via: Cup;Straw Medication Administration: Whole meds with liquid Supervision: Patient able to self feed;Intermittent supervision  to cue for compensatory strategies Compensations: Slow rate;Small sips/bites Postural Changes and/or Swallow Maneuvers: Seated upright 90  degrees;Upright 30-60 min after meal    Other  Recommendations Oral Care Recommendations: Oral care BID   Follow Up Recommendations  None    Frequency and Duration min 2x/week  2 weeks       SLP Swallow Goals Patient will utilize recommended strategies during swallow to  increase swallowing safety with: Independent assistance Swallow Study Goal #2 - Progress: Not met   General HPI: 78 year old female who lives in an ALF. She has  baseline exertional dyspnea and has had cough (non-productive)  for > 2 weeks which seems to be worse when she is recumbent. She  also has noticed  the sensation of difficulty swallowing for the  last 2 weeks (of note she has had prior esophageal dilations in  past). Was in usual stat of health until acutely the am of 10/24.  States had even been seen on 10/23 w/ clean bill of health by  rounding NP at ALF. She denies sick exposure, fevers, chills,  chest pain, nausea or vomiting. Has had not LE swelling. No wt  gain or loss. She presented to the ER in acute  distress requiring  NIPPV.  Presented to ER on 10/24 with acute on chronic dyspnea,  hemoptysis: initially bright red, ~1/4 teaspoon in volume  estimated that she had a total of 6-8 episodes of this before it  started to subside: no more pink tinged. CXR indicates left  greater than right airspace disease.  Type of Study: Modified Barium Swallowing Study Reason for Referral: Objectively evaluate swallowing function Previous Swallow Assessment: Bedside swallow evaluation complete  during previous [facility] admission on 06/16/12 and indicated a suspected  primary esophageal dysphagia. Barium swallow complete during that  same admission indicated Marked esophageal dysmotility with  possible diffuse esophageal Diet Prior to this Study: NPO Temperature Spikes Noted: Yes Respiratory Status: Supplemental O2 delivered via (comment) (2 L  nasal cannula) History of Recent Intubation: No Behavior/Cognition: Alert;Cooperative;Pleasant mood Oral Cavity - Dentition: Adequate natural dentition Oral Motor / Sensory Function: Impaired motor Oral impairment: Right lingual (tongue deviates to the right, no  other focal deficits) Self-Feeding Abilities: Able to feed self Patient Positioning: Upright in bed Baseline Vocal Quality: Clear Volitional Cough: Strong Volitional Swallow: Able to elicit Anatomy: Within functional limits Pharyngeal Secretions: Not observed secondary MBS    Reason for Referral Objectively evaluate swallowing function   Oral Phase Oral Preparation/Oral Phase Oral Phase: WFL   Pharyngeal Phase Pharyngeal Phase Pharyngeal Phase: Impaired Pharyngeal - Thin Pharyngeal - Thin Cup: Delayed swallow initiation;Premature  spillage to valleculae;Pharyngeal residue - valleculae;Pharyngeal  residue - pyriform sinuses;Reduced tongue base retraction Pharyngeal - Thin Straw: Delayed swallow initiation;Premature  spillage to pyriform sinuses;Reduced tongue base  retraction;Penetration/Aspiration before swallow;Pharyngeal  residue -  pyriform sinuses;Pharyngeal residue - valleculae Penetration/Aspiration details (thin straw): Material enters  airway, CONTACTS cords then ejected out (spontaneous throat clear  to clear trace penetrates) Pharyngeal - Solids Pharyngeal - Puree: Reduced tongue base retraction;Pharyngeal  residue - valleculae Pharyngeal - Mechanical Soft: Reduced tongue base  retraction;Pharyngeal residue - valleculae Pharyngeal - Pill: Within functional limits (provided whole with  thin liquid)  Cervical Esophageal Phase    GO   Leah McCoy MA, CCC-SLP [phone]  Cervical Esophageal Phase Cervical Esophageal Phase: Impaired Cervical Esophageal Phase - Thin Thin Cup: Reduced cricopharyngeal relaxation Thin Straw: Reduced cricopharyngeal relaxation Cervical Esophageal Phase - Solids Puree: Reduced cricopharyngeal relaxation Mechanical Soft: Reduced cricopharyngeal relaxation Pill: Reduced cricopharyngeal relaxation (mild)         McCoy Leah [name] 10/17/2012, 12:06 PM      Scheduled Meds:    . aspirin  81 mg Oral Daily  . cholecalciferol  1,000 Units Oral Daily  . clopidogrel  75 mg Oral Q breakfast  . donepezil  5 mg Oral QHS  . famotidine  20 mg Oral BID  . feeding supplement  237 mL Oral BID BM  . Fluticasone-Salmeterol  1 puff Inhalation Q12H  . guaiFENesin  600 mg Oral BID  . imipenem-cilastatin  250 mg Intravenous Q12H  . metoprolol tartrate  25 mg Oral BID  . mirtazapine  7.5 mg Oral QHS  . montelukast  10 mg Oral q morning - 10a  . simvastatin  10 mg Oral q1800  . sodium chloride  2 spray Nasal q morning - 10a  . tetrahydrozoline  1 drop Both Eyes QID  . vancomycin  500 mg Intravenous Q48H  . DISCONTD: ipratropium  0.5 mg Nebulization Q6H  . DISCONTD: levalbuterol  0.63 mg Nebulization Q6H  . DISCONTD: metoprolol tartrate  12.5 mg Oral TID   Continuous Infusions:    . dextrose 5 % and 0.45% NaCl 60 mL/hr at 10/18/12 0500     ________________________________________________________________________  Time spent: 35 min    [facility]  [facility] Pager [phone] If 8PM-8AM, please contact night-coverage at [email or web address], password [facility] 10/18/2012, 4:54 PM  LOS: 2 days

## 2012-10-19 ENCOUNTER — Inpatient Hospital Stay (HOSPITAL_COMMUNITY): Payer: Medicare Other

## 2012-10-19 DIAGNOSIS — R7989 Other specified abnormal findings of blood chemistry: Secondary | ICD-10-CM

## 2012-10-19 DIAGNOSIS — I214 Non-ST elevation (NSTEMI) myocardial infarction: Secondary | ICD-10-CM

## 2012-10-19 DIAGNOSIS — J9600 Acute respiratory failure, unspecified whether with hypoxia or hypercapnia: Secondary | ICD-10-CM

## 2012-10-19 DIAGNOSIS — I5043 Acute on chronic combined systolic (congestive) and diastolic (congestive) heart failure: Secondary | ICD-10-CM

## 2012-10-19 MED ORDER — ACETAMINOPHEN 500 MG PO TABS
1000.0000 mg | ORAL_TABLET | Freq: Three times a day (TID) | ORAL | Status: DC
  Administered 2012-10-19 – 2012-10-24 (×16): 1000 mg via ORAL
  Filled 2012-10-19 (×20): qty 2

## 2012-10-19 MED ORDER — CLOPIDOGREL BISULFATE 75 MG PO TABS
75.0000 mg | ORAL_TABLET | Freq: Every day | ORAL | Status: DC
  Administered 2012-10-19 – 2012-10-24 (×6): 75 mg via ORAL
  Filled 2012-10-19 (×7): qty 1

## 2012-10-19 MED ORDER — ENOXAPARIN SODIUM 40 MG/0.4ML ~~LOC~~ SOLN
40.0000 mg | SUBCUTANEOUS | Status: DC
  Administered 2012-10-19: 40 mg via SUBCUTANEOUS
  Filled 2012-10-19 (×2): qty 0.4

## 2012-10-19 MED ORDER — FAMOTIDINE 20 MG PO TABS
20.0000 mg | ORAL_TABLET | Freq: Every day | ORAL | Status: DC
  Administered 2012-10-20: 20 mg via ORAL
  Filled 2012-10-19: qty 1

## 2012-10-19 MED ORDER — ACETAMINOPHEN 325 MG PO TABS
650.0000 mg | ORAL_TABLET | Freq: Four times a day (QID) | ORAL | Status: DC | PRN
  Administered 2012-10-21: 650 mg via ORAL
  Filled 2012-10-19 (×2): qty 2

## 2012-10-19 MED ORDER — DEXTROSE-NACL 5-0.45 % IV SOLN: INTRAVENOUS | Status: AC

## 2012-10-19 MED ORDER — ONDANSETRON HCL 4 MG/2ML IJ SOLN
4.0000 mg | Freq: Four times a day (QID) | INTRAMUSCULAR | Status: DC | PRN
  Administered 2012-10-19 – 2012-10-20 (×5): 4 mg via INTRAVENOUS
  Filled 2012-10-19 (×5): qty 2

## 2012-10-19 NOTE — Progress Notes (Signed)
ANTIBIOTIC CONSULT NOTE - FOLLOW UP  Pharmacy Consult for vancomycin, Primaxin Indication: pneumonia  Allergies  Allergen Reactions  . Altace (Ramipril) Other (See Comments)    Reaction unknown  . Ambien (Zolpidem Tartrate) Other (See Comments)    Reaction unknown  . Codeine Other (See Comments)    Reaction unknown  . Demerol (Meperidine) Other (See Comments)    Reaction unknown  . Penicillins Other (See Comments)    Reaction unknown  . Sulfa Antibiotics Other (See Comments)    Reaction unknown  . Versed (Midazolam) Other (See Comments)    Reaction unknown    Patient Measurements: Height: 5' (152.4 cm) Weight: 104 lb 8 oz (47.401 kg) IBW/kg (Calculated) : 45.5    Vital Signs: Temp: 97.6 F (36.4 C) (10/27 0527) Temp src: Oral (10/27 0527) BP: 132/74 mmHg (10/27 0527) Pulse Rate: 88  (10/27 0527) Intake/Output from previous day: 10/26 0701 - 10/27 0700 In: 2251 [P.O.:720; I.V.:1229; IV Piggyback:302] Out: 2 [Urine:1; Stool:1] Intake/Output from this shift:    Labs:  Basename 10/18/12 0530 10/17/12 1747 10/17/12 0540  WBC -- -- 9.1  HGB -- -- 10.6*  PLT -- -- 152  LABCREA -- -- --  CREATININE 1.57* 1.50* 1.81*   Estimated Creatinine Clearance: 17.8 ml/min (by C-G formula based on Cr of 1.57). No results found for this basename: VANCOTROUGH:2,VANCOPEAK:2,VANCORANDOM:2,GENTTROUGH:2,GENTPEAK:2,GENTRANDOM:2,TOBRATROUGH:2,TOBRAPEAK:2,TOBRARND:2,AMIKACINPEAK:2,AMIKACINTROU:2,AMIKACIN:2, in the last 72 hours   Microbiology: 10/24 Blood >> NG 10/24 Sputum >> ngtd 10/24 Urine >> NG 10/24 MRSA PCR: negative    Anti-infectives     Start     Dose/Rate Route Frequency Ordered Stop   10/18/12 1000   levofloxacin (LEVAQUIN) IVPB 500 mg  Status:  Discontinued        500 mg 100 mL/hr over 60 Minutes Intravenous Every 48 hours 10/16/12 1414 10/16/12 1446   10/18/12 1000   vancomycin (VANCOCIN) 500 mg in sodium chloride 0.9 % 100 mL IVPB        500 mg 100 mL/hr over  60 Minutes Intravenous Every 48 hours 10/16/12 1415     10/16/12 1600   imipenem-cilastatin (PRIMAXIN) 250 mg in sodium chloride 0.9 % 100 mL IVPB        250 mg 200 mL/hr over 30 Minutes Intravenous Every 12 hours 10/16/12 1456     10/16/12 1345   levofloxacin (LEVAQUIN) IVPB 750 mg  Status:  Discontinued        750 mg 100 mL/hr over 90 Minutes Intravenous Every 24 hours 10/16/12 1344 10/16/12 1348   10/16/12 1015   vancomycin (VANCOCIN) IVPB 1000 mg/200 mL premix        1,000 mg 200 mL/hr over 60 Minutes Intravenous  Once 10/16/12 1006 10/16/12 1601   10/16/12 0930   levofloxacin (LEVAQUIN) IVPB 750 mg  Status:  Discontinued        750 mg 100 mL/hr over 90 Minutes Intravenous Every 24 hours 10/16/12 0925 10/16/12 1414          Assessment: 78 yo F admitted 10/16/2012 with Worsening DOE, chills but no fever and hypoxia concerning for PNA. Pharmacy consulted to dose vancomycin and Primaxin  WBC trend down significantly- no new CBC, Afebrile.  Culture results as above  SCr1.57 (baseline 1.3-1.6), lytes ok, antibiotic doses remain appropriate   Goal of Therapy:  Vancomycin trough level 15-20 mcg/ml  Plan:  1. Vancomycin 500mg  IV q48h.  2. Primaxin 250 mg Q 12 hours 3. Follow up cultures and sensitivities, clinical status, [facility] at [facility] and renal function.  4. Decrease famotidine to 20mg  daily from BID due to reduced renal clearance per discussion with Dr. Jerral Ralph   Thank you for the consult.  Tomi Bamberger, PharmD Clinical Pharmacist Pager: [phone] Pharmacy: [phone] 10/19/2012 11:57 AM

## 2012-10-19 NOTE — Progress Notes (Signed)
Patient Name: [name]      SUBJECTIVE: admitted with SOB with hx of chronic DOE X months assoc with +Tn---intially presumed type 2 and elevated BNP;  Found to have necrotizing pneumonia and hemoptysis  Feeling better but some cough and minimal hemoptysis  Major complaints are shoulder pain and nausea;; breathing better Past Medical History  Diagnosis Date  . Hypertension   . Peripheral arterial disease   . Cognitive disorder   . Esophageal stricture   . Osteoarthritis   . Anxiety   . UTI (lower urinary tract infection) June 2013  . COPD (chronic obstructive pulmonary disease)     PHYSICAL EXAM Filed Vitals:   10/18/12 2037 10/19/12 0527 10/19/12 0822 10/19/12 0833  BP: 165/79 132/74    Pulse: 101 88    Temp: 97.5 F (36.4 C) 97.6 F (36.4 C)    TempSrc: Axillary Oral    Resp: 22 22    Height: 5' (1.524 m)     Weight:  104 lb 8 oz (47.401 kg)    SpO2: 95% 93% 91% 91%    General appearance: alert, cooperative and mild distress Neck: JVD - 8=10   Lungs: decresased breath sounds L>R Heart: regular rate and rhythm, S1, S2 normal and systolic murmur: early systolic 2/6,  at 2nd right intercostal space Abdomen: soft, non-tender; bowel sounds normal; no masses,  no organomegaly Extremities: edema none Skin: Skin color, texture, turgor normal. No rashes or lesions Neurologic: Grossly normal  TELEMETRY: Reviewed telemetry pt in nsr:    Intake/Output Summary (Last 24 hours) at 10/19/12 1036 Last data filed at 10/19/12 0900  Gross per 24 hour  Intake   1851 ml  Output      0 ml  Net   1851 ml    LABS: Basic Metabolic Panel:  Lab 10/18/12 7829 10/17/12 1747 10/17/12 0540 10/16/12 0835  NA 138 133* 137 137  K 4.3 4.5 4.4 4.7  CL 108 102 105 102  CO2 23 20 22 23   GLUCOSE 113* 153* 112* 199*  BUN 28* 30* 30* 21  CREATININE 1.57* 1.50* 1.81* 1.38*  CALCIUM 8.6 8.9 -- --  MG -- -- -- --  PHOS -- -- -- --   Cardiac Enzymes:  Basename 10/17/12 0021  10/16/12 1830 10/16/12 1824 10/16/12 1452  CKTOTAL 93 117 -- 94  CKMB 6.8* 8.8* -- 9.0*  CKMBINDEX -- -- -- --  TROPONINI 1.87* -- 1.04* 1.62*   CBC:  Lab 10/17/12 0540 10/16/12 0835  WBC 9.1 16.3*  NEUTROABS -- --  HGB 10.6* 15.5*  HCT 33.4* 46.8*  MCV 87.9 88.8  PLT 152 299   PROTIME: No results found for this basename: LABPROT:3,INR:3 in the last 72 hours Liver Function Tests: No results found for this basename: AST:2,ALT:2,ALKPHOS:2,BILITOT:2,PROT:2,ALBUMIN:2 in the last 72 hours No results found for this basename: LIPASE:2,AMYLASE:2 in the last 72 hours BNP: BNP (last 3 results)  Basename 10/16/12 0836  PROBNP 9826.0*   D-Dimer: No results found for this basename: DDIMER:2 in the last 72 hours Hemoglobin A1C: No results found for this basename: HGBA1C in the last 72 hours Fasting Lipid Panel:  Basename 10/17/12 0540  CHOL 98  HDL 56  LDLCALC 27  TRIG 73  CHOLHDL 1.8  LDLDIRECT --   Thyroid Function Tests:  Basename 10/16/12 1824  TSH 1.520  T4TOTAL --  T3FREE --  THYROIDAB --   Anemia Panel: No results found for this basename: VITAMINB12,FOLATE,FERRITIN,TIBC,IRON,RETICCTPCT in the last 72 hours  ECHO  EF 40-45% with moderate LVH and wma    ASSESSMENT AND PLAN:  Patient Active Hospital Problem List: Acute hypoxic respiratory failure  (10/16/2012)  Healthcare-associated pneumonia (10/16/2012)   Elevated troponin  Poss NSTEMI concerning with 2 bump and multiple WMA on echo   Systolic and diastolic CHF, acute on chronic (10/17/2012)    continue plavix ans asa Continue betablocker and statin  Signed, Sherryl Manges MD  10/19/2012

## 2012-10-19 NOTE — Plan of Care (Signed)
Problem: Phase I Progression Outcomes Goal: Initial discharge plan identified Outcome: Completed/Met Date Met:  10/19/12 Pt to return to [facility] when medically cleared

## 2012-10-19 NOTE — Progress Notes (Signed)
Clinical Social Work Department BRIEF PSYCHOSOCIAL ASSESSMENT 10/19/2012  Patient:  [name], [name]     Account Number:  [id number]     Admit date:  10/16/2012  Clinical Social Worker: Johnsie Cancel,  Date/Time:  10/19/2012 12:54 PM  Referred by:  RN  Date Referred:  10/19/2012 Referred for  ALF Placement   Other Referral:   Interview type:  Family Other interview type:    PSYCHOSOCIAL DATA Living Status:  FACILITY Admitted from facility[facility]   [facility] Level of care:  Assisted Living Primary support name:  [name] ([phone]) Primary support relationship to patient:  CHILD, ADULT Degree of support available:   Adequate, strong.    CURRENT CONCERNS Current Concerns  Post-Acute Placement   Other Concerns:    SOCIAL WORK ASSESSMENT / PLAN CSW was consulted by RN re: patient being from [facility]. CSW contacted the patient's HCPOA due to severe impaired hearing. HCPOA confirmed patient was from [facility] and would like the patient to return at d/c. HCPOA stated that physical therapy can be provided at the facility if needed. CSW received permission from the [facility] to contact the facility to confirm they would be able to medically manage the patient at discharge. The HCPOA also stated she would be open to a SNF placement if the doctor decides the therapy provided at [facility] would not be adequate. Weekday CSW will follow up with [facility] re: discharge plans.   Assessment/plan status:  Information/Referral to [facility] Other assessment/ plan:   Information/referral to community resources:    PATIENT'S/FAMILY'S RESPONSE TO PLAN OF CARE: HCPOA thanked CSW for facilitating discharge plan and providing support during patient's hospital stay.   Lia Foyer, LCSWA [facility] Clinical Social Worker Contact #: [phone] (weekend)

## 2012-10-19 NOTE — Progress Notes (Signed)
PATIENT DETAILS Name: [name] Age: 78 y.o. Sex: female Date of Birth: Sep 18, 1922 Admit Date: 10/16/2012 Admitting Physician Ripudeep Jenna Luo, MD [id number], Sherilyn Cooter, MD  Subjective: Complains to Right shoulder pain-"making me anxious"  Assessment/Plan: Principal Problem:  *Acute hypoxic respiratory failure  -2/2 HCAP with necrosis -titrate of O2 as tolerated -on admission-required BIPAP support  Active Problems: Healthcare-associated pneumonia- necrotizing -Afebrile, leukocytosis has resolved -C/w Vanco/Primaxin-started 10/24 -sputum c/s-not diagnostic  NSTEMI -more likely demand ischemia -likely has underlying CAD -cardiology recommending conservative treatment -on ASA,Plavix, Metoprolol and Statins -Echo-10/24-shows moderate hypokinesis in ant wall and apex  CHF -both systolic and diastolic -EF per YNWG-95-62 % -clinically compensated-now off Lasix -taper off IVF  Hemoptysis -seems to have mostly resolved -2/2 to PNA-c/w Antibiotics  Right Shoulder Pain -exam shows no swelling or overlying erythema -suspect OA -check Xray -start scheduled Tylenol -heat pad   Peripheral arterial disease -c/w ASA, plavix  CKD -stage 3 -monitor lytes periodically  Dementia -stable -c/w Aricept  Anxiety -some anxiety 2/2 to right shoulder pain -as needed Xanax  Disposition: Remain inpatient  DVT Prophylaxis: Prophylactic Lovenox   Code Status: DNR  Procedures:  None  CONSULTS:  cardiology and pulmonary/intensive care  PHYSICAL EXAM: Vital signs in last 24 hours: Filed Vitals:   10/18/12 2037 10/19/12 0527 10/19/12 0822 10/19/12 0833  BP: 165/79 132/74    Pulse: 101 88    Temp: 97.5 F (36.4 C) 97.6 F (36.4 C)    TempSrc: Axillary Oral    Resp: 22 22    Height: 5' (1.524 m)     Weight:  47.401 kg (104 lb 8 oz)    SpO2: 95% 93% 91% 91%    Weight change:  Body mass index is 20.41 kg/(m^2).   Gen Exam: Awake and alert with clear speech.     Neck: Supple, No JVD.   Chest: Rales at left base CVS: S1 S2 Regular, no murmurs.  Abdomen: soft, BS +, non tender, non distended.  Extremities: no edema, lower extremities warm to touch.Right shoulder-not tender to palpation, no overlying erythema, not swollen Neurologic: Non Focal.   Skin: No Rash.   Wounds: N/A.    Intake/Output from previous day:  Intake/Output Summary (Last 24 hours) at 10/19/12 0846 Last data filed at 10/19/12 0700  Gross per 24 hour  Intake   2191 ml  Output      2 ml  Net   2189 ml     LAB RESULTS: CBC  Lab 10/17/12 0540 10/16/12 0835  WBC 9.1 16.3*  HGB 10.6* 15.5*  HCT 33.4* 46.8*  PLT 152 299  MCV 87.9 88.8  MCH 27.9 29.4  MCHC 31.7 33.1  RDW 13.4 13.2  LYMPHSABS -- --  MONOABS -- --  EOSABS -- --  BASOSABS -- --  BANDABS -- --    Chemistries   Lab 10/18/12 0530 10/17/12 1747 10/17/12 0540 10/16/12 0835  NA 138 133* 137 137  K 4.3 4.5 4.4 4.7  CL 108 102 105 102  CO2 23 20 22 23   GLUCOSE 113* 153* 112* 199*  BUN 28* 30* 30* 21  CREATININE 1.57* 1.50* 1.81* 1.38*  CALCIUM 8.6 8.9 8.6 9.2  MG -- -- -- --    CBG: No results found for this basename: GLUCAP:5 in the last 168 hours  GFR Estimated Creatinine Clearance: 17.8 ml/min (by C-G formula based on Cr of 1.57).  Coagulation profile  Lab 10/16/12 0835  INR 1.06  PROTIME --    Cardiac  Enzymes  Lab 10/17/12 0021 10/16/12 1830 10/16/12 1824 10/16/12 1452  CKMB 6.8* 8.8* -- 9.0*  TROPONINI 1.87* -- 1.04* 1.62*  MYOGLOBIN -- -- -- --    No components found with this basename: POCBNP:3 No results found for this basename: DDIMER:2 in the last 72 hours No results found for this basename: HGBA1C:2 in the last 72 hours  Basename 10/17/12 0540  CHOL 98  HDL 56  LDLCALC 27  TRIG 73  CHOLHDL 1.8  LDLDIRECT --    Basename 10/16/12 1824  TSH 1.520  T4TOTAL --  T3FREE --  THYROIDAB --   No results found for this basename:  VITAMINB12:2,FOLATE:2,FERRITIN:2,TIBC:2,IRON:2,RETICCTPCT:2 in the last 72 hours No results found for this basename: LIPASE:2,AMYLASE:2 in the last 72 hours  Urine Studies No results found for this basename: UACOL:2,UAPR:2,USPG:2,UPH:2,UTP:2,UGL:2,UKET:2,UBIL:2,UHGB:2,UNIT:2,UROB:2,ULEU:2,UEPI:2,UWBC:2,URBC:2,UBAC:2,CAST:2,CRYS:2,UCOM:2,BILUA:2 in the last 72 hours  MICROBIOLOGY: Recent Results (from the past 240 hour(s))  CULTURE, BLOOD (ROUTINE X 2)     Status: Normal (Preliminary result)   Collection Time   10/16/12  9:40 AM      Component Value Range Status Comment   Specimen Description BLOOD LEFT RADIAL   Final    Special Requests BOTTLES DRAWN AEROBIC ONLY 6CC   Final    Culture  Setup Time 10/16/2012 15:37   Final    Culture     Final    Value:        BLOOD CULTURE RECEIVED NO GROWTH TO DATE CULTURE WILL BE HELD FOR 5 DAYS BEFORE ISSUING A FINAL NEGATIVE REPORT   Report Status PENDING   Incomplete   CULTURE, BLOOD (ROUTINE X 2)     Status: Normal (Preliminary result)   Collection Time   10/16/12  9:50 AM      Component Value Range Status Comment   Specimen Description BLOOD ARM LEFT   Final    Special Requests     Final    Value: BOTTLES DRAWN AEROBIC AND ANAEROBIC 10CC AER 3CC ANA   Culture  Setup Time 10/16/2012 15:37   Final    Culture     Final    Value:        BLOOD CULTURE RECEIVED NO GROWTH TO DATE CULTURE WILL BE HELD FOR 5 DAYS BEFORE ISSUING A FINAL NEGATIVE REPORT   Report Status PENDING   Incomplete   MRSA PCR SCREENING     Status: Normal   Collection Time   10/16/12  1:55 PM      Component Value Range Status Comment   MRSA by PCR NEGATIVE  NEGATIVE Final   URINE CULTURE     Status: Normal   Collection Time   10/16/12  4:32 PM      Component Value Range Status Comment   Specimen Description URINE, CLEAN CATCH   Final    Special Requests Normal   Final    Culture  Setup Time 10/16/2012 17:23   Final    Colony Count NO GROWTH   Final    Culture NO GROWTH    Final    Report Status 10/17/2012 FINAL   Final   CULTURE, EXPECTORATED SPUTUM-ASSESSMENT     Status: Normal   Collection Time   10/17/12  6:32 AM      Component Value Range Status Comment   Specimen Description SPUTUM   Final    Special Requests Normal   Final    Sputum evaluation     Final    Value: MICROSCOPIC FINDINGS SUGGEST THAT THIS SPECIMEN IS NOT REPRESENTATIVE  OF LOWER RESPIRATORY SECRETIONS. PLEASE RECOLLECT.     Gram Stain Report Called to,Read Back By and Verified With: B BRADT,RN AT 0802 10/17/12 BY K BARR   Report Status 10/17/2012 FINAL   Final     RADIOLOGY STUDIES/RESULTS: Ct Angio Chest Pe W/cm &/or Wo Cm  10/16/2012  *RADIOLOGY REPORT*  Clinical Data: 78 year old female with peripheral artery disease. Hypertension.  Acute respiratory distress and hemoptysis.  CT ANGIOGRAPHY CHEST  Technique:  Multidetector CT imaging of the chest using the standard protocol during bolus administration of intravenous contrast. Multiplanar reconstructed images including MIPs were obtained and reviewed to evaluate the vascular anatomy.  Contrast: 80mL OMNIPAQUE IOHEXOL 350 MG/ML SOLN  Comparison: Portable chest radiograph the same day and earlier.  Findings: Good contrast bolus timing in the pulmonary arterial tree.  Mild respiratory motion artifact. No focal filling defect identified in the pulmonary arterial tree to suggest the presence of acute pulmonary embolism.  Extensive left greater than right lower lobe pulmonary consolidation.  Within the consolidated lung, typically on the left enhancing pulmonary vasculature is noted.  No definite areas of contrast extravasation within the lungs are identified.  Scattered areas of gas within the consolidated lung on the left, some suspicious for early cavitary change, but no definite areas of pulmonary necrosis.  The right lower lobe is significantly less affected.  Underlying centrilobular emphysema.  Major airways are patent.  Cardiomegaly.  No  pericardial effusion.  Only trace pleural effusions.  Negative visualized upper abdominal viscera.  No mediastinal lymphadenopathy.  Intermittent gaseous distention of the thoracic esophagus.  Negative visualized thoracic inlet.  No acute osseous abnormality identified.  IMPRESSION: 1. No evidence of acute pulmonary embolus.  No definite pulmonary vascular contrast extravasation identified in the abnormal left lung, see #2.  2.  Extensive left lower lobe consolidation.  Severe pneumonia or aspiration could have this appearance.  Necrotizing pneumonia cannot be excluded.  3.  Comparatively mild right lower lobe consolidation.  Trace bilateral pleural effusions.  4.  Underlying emphysema.  Cardiomegaly.   Original Report Authenticated By: Harley Hallmark, M.D.    Dg Chest Port 1 View  10/17/2012  *RADIOLOGY REPORT*  Clinical Data: Pneumonia  PORTABLE CHEST - 1 VIEW  Comparison: 10/16/2012  Findings: Extensive airspace disease within the left mid and lower lung zones has improved.  Cardiomegaly persist.  No pneumothorax. Stable appearance of the right lung with scattered scarring verse atelectasis.  IMPRESSION: Improving left lower lobe pneumonia.   Original Report Authenticated By: Donavan Burnet, M.D.    Dg Chest Portable 1 View  10/16/2012  *RADIOLOGY REPORT*  Clinical Data: Shortness of breath, respiratory distress  PORTABLE CHEST - 1 VIEW  Comparison: Portable exam 0857 hours compared to 06/13/2012  Findings: Minimal enlargement of cardiac silhouette. Mediastinal contours normal. Extensive airspace infiltrate identified in lingula and left lower lobe consistent with pneumonia. Aspiration not entirely excluded. Underlying emphysematous changes. Probable mild infiltrate at right base as well. No gross pleural effusion or pneumothorax. Diffuse osseous demineralization.  IMPRESSION: Extensive airspace consolidation involving the lingula and left lower lobe consistent with pneumonia though aspiration not  excluded. Minimal similar right basilar infiltrate.   Original Report Authenticated By: Lollie Marrow, M.D.    Dg Swallowing Func-speech Pathology  10/17/2012  Vivi Ferns McCoy, CCC-SLP     10/17/2012 12:07 PM Objective Swallowing Evaluation: Modified Barium Swallowing Study   Patient Details  Name: [name] MRN: [id number] Date of Birth: 05-03-1922  Today's Date: 10/17/2012 Time: [phone]  SLP Time Calculation (min): 20 min  Past Medical History:  Past Medical History  Diagnosis Date  . Hypertension   . Peripheral arterial disease   . Cognitive disorder   . Esophageal stricture   . Osteoarthritis   . Anxiety   . UTI (lower urinary tract infection) June 2013  . COPD (chronic obstructive pulmonary disease)    Past Surgical History:  Past Surgical History  Procedure Date  . Femoral bypass   . Esophagogastroduodenoscopy 06/21/2012    Procedure: ESOPHAGOGASTRODUODENOSCOPY (EGD);  Surgeon: Theda Belfast, MD;  Location: [facility];  Service: Endoscopy;   Laterality: N/A;   HPI:  78 year old female who lives in an ALF. She has baseline  exertional dyspnea and has had cough (non-productive) for > 2  weeks which seems to be worse when she is recumbent. She also has  noticed the sensation of difficulty swallowing for the last 2  weeks (of note she has had prior esophageal dilations in past).  Was in usual stat of health until acutely the am of 10/24. States  had even been seen on 10/23 w/ clean bill of health by rounding  NP at ALF. She denies sick exposure, fevers, chills, chest pain,  nausea or vomiting. Has had not LE swelling. No wt gain or loss.  She presented to the ER in acute distress requiring NIPPV.   Presented to ER on 10/24 with acute on chronic dyspnea,  hemoptysis: initially bright red, ~1/4 teaspoon in volume  estimated that she had a total of 6-8 episodes of this before it  started to subside: no more pink tinged. CXR indicates left  greater than right airspace disease.      Assessment / Plan /  Recommendation Clinical Impression  Dysphagia Diagnosis: Suspected primary esophageal dysphagia;Mild  oral phase dysphagia;Mild pharyngeal phase dysphagia Clinical impression: Patient continues to present with a  suspected primary esophageal dysphagia with a mild, secondary,  oropharyngeal component. Overall function characterized by  intermittently delayed swallow initiation with larger thin liquid  bolus sizes, when combined with mildly decreased base of tongue  strength, decreased hyo-laryngeal elevation, excursion, and UES  relaxation, result in trace penetrates which are spontaneously  cleared. Otherwise, full airway protection noted. Spontaneous dry  swallow additonally successful to clear trace-minimal pharyngeal  residuals post swallow. Although combination of esophageal and  pharyngeal components does increase aspiration risk, patient  judged safe to resume a po diet with aspiration precautions at  this time. SLP will f/u closely at bedside for continued  education and diet tolerance.     Treatment Recommendation  Therapy as outlined in treatment plan below    Diet Recommendation Dysphagia 3 (Mechanical Soft);Thin liquid   Liquid Administration via: Cup;Straw Medication Administration: Whole meds with liquid Supervision: Patient able to self feed;Intermittent supervision  to cue for compensatory strategies Compensations: Slow rate;Small sips/bites Postural Changes and/or Swallow Maneuvers: Seated upright 90  degrees;Upright 30-60 min after meal    Other  Recommendations Oral Care Recommendations: Oral care BID   Follow Up Recommendations  None    Frequency and Duration min 2x/week  2 weeks       SLP Swallow Goals Patient will utilize recommended strategies during swallow to  increase swallowing safety with: Independent assistance Swallow Study Goal #2 - Progress: Not met   General HPI: 78 year old female who lives in an ALF. She has  baseline exertional dyspnea and has had cough (non-productive)  for > 2  weeks which seems to  be worse when she is recumbent. She  also has noticed the sensation of difficulty swallowing for the  last 2 weeks (of note she has had prior esophageal dilations in  past). Was in usual stat of health until acutely the am of 10/24.  States had even been seen on 10/23 w/ clean bill of health by  rounding NP at ALF. She denies sick exposure, fevers, chills,  chest pain, nausea or vomiting. Has had not LE swelling. No wt  gain or loss. She presented to the ER in acute distress requiring  NIPPV.  Presented to ER on 10/24 with acute on chronic dyspnea,  hemoptysis: initially bright red, ~1/4 teaspoon in volume  estimated that she had a total of 6-8 episodes of this before it  started to subside: no more pink tinged. CXR indicates left  greater than right airspace disease.  Type of Study: Modified Barium Swallowing Study Reason for Referral: Objectively evaluate swallowing function Previous Swallow Assessment: Bedside swallow evaluation complete  during previous [facility] admission on 06/16/12 and indicated a suspected  primary esophageal dysphagia. Barium swallow complete during that  same admission indicated Marked esophageal dysmotility with  possible diffuse esophageal Diet Prior to this Study: NPO Temperature Spikes Noted: Yes Respiratory Status: Supplemental O2 delivered via (comment) (2 L  nasal cannula) History of Recent Intubation: No Behavior/Cognition: Alert;Cooperative;Pleasant mood Oral Cavity - Dentition: Adequate natural dentition Oral Motor / Sensory Function: Impaired motor Oral impairment: Right lingual (tongue deviates to the right, no  other focal deficits) Self-Feeding Abilities: Able to feed self Patient Positioning: Upright in bed Baseline Vocal Quality: Clear Volitional Cough: Strong Volitional Swallow: Able to elicit Anatomy: Within functional limits Pharyngeal Secretions: Not observed secondary MBS    Reason for Referral Objectively evaluate swallowing function   Oral Phase Oral  Preparation/Oral Phase Oral Phase: WFL   Pharyngeal Phase Pharyngeal Phase Pharyngeal Phase: Impaired Pharyngeal - Thin Pharyngeal - Thin Cup: Delayed swallow initiation;Premature  spillage to valleculae;Pharyngeal residue - valleculae;Pharyngeal  residue - pyriform sinuses;Reduced tongue base retraction Pharyngeal - Thin Straw: Delayed swallow initiation;Premature  spillage to pyriform sinuses;Reduced tongue base  retraction;Penetration/Aspiration before swallow;Pharyngeal  residue - pyriform sinuses;Pharyngeal residue - valleculae Penetration/Aspiration details (thin straw): Material enters  airway, CONTACTS cords then ejected out (spontaneous throat clear  to clear trace penetrates) Pharyngeal - Solids Pharyngeal - Puree: Reduced tongue base retraction;Pharyngeal  residue - valleculae Pharyngeal - Mechanical Soft: Reduced tongue base  retraction;Pharyngeal residue - valleculae Pharyngeal - Pill: Within functional limits (provided whole with  thin liquid)  Cervical Esophageal Phase    GO   Leah McCoy MA, CCC-SLP [phone]  Cervical Esophageal Phase Cervical Esophageal Phase: Impaired Cervical Esophageal Phase - Thin Thin Cup: Reduced cricopharyngeal relaxation Thin Straw: Reduced cricopharyngeal relaxation Cervical Esophageal Phase - Solids Puree: Reduced cricopharyngeal relaxation Mechanical Soft: Reduced cricopharyngeal relaxation Pill: Reduced cricopharyngeal relaxation (mild)         McCoy Leah [name] 10/17/2012, 12:06 PM      MEDICATIONS: Scheduled Meds:   . acetaminophen  1,000 mg Oral TID  . aspirin  81 mg Oral Daily  . cholecalciferol  1,000 Units Oral Daily  . clopidogrel  75 mg Oral Q breakfast  . donepezil  5 mg Oral QHS  . famotidine  20 mg Oral BID  . feeding supplement  237 mL Oral BID BM  . Fluticasone-Salmeterol  1 puff Inhalation Q12H  . guaiFENesin  600 mg Oral BID  . imipenem-cilastatin  250 mg Intravenous Q12H  . metoprolol  tartrate  25 mg Oral BID  . mirtazapine  7.5 mg  Oral QHS  . montelukast  10 mg Oral q morning - 10a  . simvastatin  10 mg Oral q1800  . sodium chloride  2 spray Nasal q morning - 10a  . tetrahydrozoline  1 drop Both Eyes QID  . vancomycin  500 mg Intravenous Q48H  . DISCONTD: clopidogrel  75 mg Oral Q breakfast  . DISCONTD: metoprolol tartrate  12.5 mg Oral TID   Continuous Infusions:   . dextrose 5 % and 0.45% NaCl 40 mL/hr at 10/19/12 0808   PRN Meds:.acetaminophen, ALPRAZolam, dextromethorphan, ipratropium, levalbuterol, ondansetron (ZOFRAN) IV, polyvinyl alcohol, traMADol, DISCONTD: acetaminophen  Antibiotics: Anti-infectives     Start     Dose/Rate Route Frequency Ordered Stop   10/18/12 1000   levofloxacin (LEVAQUIN) IVPB 500 mg  Status:  Discontinued        500 mg 100 mL/hr over 60 Minutes Intravenous Every 48 hours 10/16/12 1414 10/16/12 1446   10/18/12 1000   vancomycin (VANCOCIN) 500 mg in sodium chloride 0.9 % 100 mL IVPB        500 mg 100 mL/hr over 60 Minutes Intravenous Every 48 hours 10/16/12 1415     10/16/12 1600   imipenem-cilastatin (PRIMAXIN) 250 mg in sodium chloride 0.9 % 100 mL IVPB        250 mg 200 mL/hr over 30 Minutes Intravenous Every 12 hours 10/16/12 1456     10/16/12 1345   levofloxacin (LEVAQUIN) IVPB 750 mg  Status:  Discontinued        750 mg 100 mL/hr over 90 Minutes Intravenous Every 24 hours 10/16/12 1344 10/16/12 1348   10/16/12 1015   vancomycin (VANCOCIN) IVPB 1000 mg/200 mL premix        1,000 mg 200 mL/hr over 60 Minutes Intravenous  Once 10/16/12 1006 10/16/12 1601   10/16/12 0930   levofloxacin (LEVAQUIN) IVPB 750 mg  Status:  Discontinued        750 mg 100 mL/hr over 90 Minutes Intravenous Every 24 hours 10/16/12 0925 10/16/12 1414           Jeoffrey Massed, MD  [facility] Pager:[phone]  If 7PM-7AM, please contact night-coverage [email or web address] Password [email or web address] 10/19/2012, 8:46 AM   LOS: 3 days

## 2012-10-19 NOTE — Progress Notes (Signed)
Pt had 100cc greenish brown emesis, zofran IV 4mg  given, pt currently resting in bed, will continue to monitor and assist as needed. [name] [facility]

## 2012-10-20 ENCOUNTER — Inpatient Hospital Stay (HOSPITAL_COMMUNITY): Payer: Medicare Other

## 2012-10-20 DIAGNOSIS — I10 Essential (primary) hypertension: Secondary | ICD-10-CM

## 2012-10-20 LAB — CBC
Hemoglobin: 11.7 g/dL — ABNORMAL LOW (ref 12.0–15.0)
RBC: 4.18 MIL/uL (ref 3.87–5.11)
WBC: 7.6 10*3/uL (ref 4.0–10.5)

## 2012-10-20 LAB — BASIC METABOLIC PANEL
GFR calc Af Amer: 40 mL/min — ABNORMAL LOW (ref >90)
GFR calc non Af Amer: 35 mL/min — ABNORMAL LOW (ref >90)
Potassium: 5.7 mEq/L — ABNORMAL HIGH (ref 3.5–5.1)
Sodium: 133 mEq/L — ABNORMAL LOW (ref 135–145)

## 2012-10-20 LAB — TROPONIN I
Troponin I: <0.3 ng/mL (ref <0.30)
Troponin I: 0.37 ng/mL (ref <0.30)

## 2012-10-20 MED ORDER — ENOXAPARIN SODIUM 30 MG/0.3ML ~~LOC~~ SOLN
30.0000 mg | SUBCUTANEOUS | Status: DC
  Administered 2012-10-20 – 2012-10-21 (×2): 30 mg via SUBCUTANEOUS
  Filled 2012-10-20 (×2): qty 0.3

## 2012-10-20 MED ORDER — SODIUM CHLORIDE 0.9 % IV SOLN
INTRAVENOUS | Status: DC
  Administered 2012-10-20 – 2012-10-21 (×2): via INTRAVENOUS

## 2012-10-20 MED ORDER — NITROGLYCERIN 0.4 MG SL SUBL: 0.4000 mg | SUBLINGUAL_TABLET | SUBLINGUAL | Status: DC | PRN

## 2012-10-20 MED ORDER — GI COCKTAIL ~~LOC~~
30.0000 mL | Freq: Three times a day (TID) | ORAL | Status: DC | PRN
  Administered 2012-10-20: 30 mL via ORAL
  Filled 2012-10-20 (×2): qty 30

## 2012-10-20 MED ORDER — SODIUM POLYSTYRENE SULFONATE 15 GM/60ML PO SUSP
30.0000 g | Freq: Once | ORAL | Status: AC
  Administered 2012-10-20: 30 g via ORAL
  Filled 2012-10-20: qty 120

## 2012-10-20 MED ORDER — PANTOPRAZOLE SODIUM 40 MG PO TBEC: 40.0000 mg | DELAYED_RELEASE_TABLET | Freq: Every day | ORAL | Status: DC

## 2012-10-20 MED ORDER — ISOSORBIDE MONONITRATE ER 30 MG PO TB24: 30.0000 mg | ORAL_TABLET | Freq: Every day | ORAL | Status: DC

## 2012-10-20 MED ORDER — PANTOPRAZOLE SODIUM 40 MG IV SOLR
40.0000 mg | Freq: Two times a day (BID) | INTRAVENOUS | Status: DC
  Administered 2012-10-20 – 2012-10-22 (×5): 40 mg via INTRAVENOUS
  Filled 2012-10-20 (×6): qty 40

## 2012-10-20 MED ORDER — ISOSORBIDE MONONITRATE ER 30 MG PO TB24
30.0000 mg | ORAL_TABLET | Freq: Every day | ORAL | Status: DC
  Administered 2012-10-20 – 2012-10-24 (×5): 30 mg via ORAL
  Filled 2012-10-20 (×6): qty 1

## 2012-10-20 MED ORDER — METOCLOPRAMIDE HCL 5 MG/ML IJ SOLN
5.0000 mg | Freq: Three times a day (TID) | INTRAMUSCULAR | Status: DC
  Administered 2012-10-20 – 2012-10-22 (×6): 5 mg via INTRAVENOUS
  Filled 2012-10-20 (×9): qty 1

## 2012-10-20 MED ORDER — PROMETHAZINE HCL 25 MG/ML IJ SOLN: 12.5000 mg | Freq: Three times a day (TID) | INTRAMUSCULAR | Status: DC | PRN

## 2012-10-20 NOTE — Progress Notes (Signed)
0830 Patient continue to have nausea and vomiting moderate amount of hemoptysis.

## 2012-10-20 NOTE — Progress Notes (Signed)
Additional Cardiology Note     I came back up to talk with patient and her daughter. The ECG suggests ongoing ischemia.  Her cardiac enzymes are + .  I suspect that her symptoms are due to coronary ischemia.  She is a very poor candidate for invasive procedures.  I suspect she has multivessel disease and would require CABG.  I think her chances of making it through CABG would be low given her age and comorbid conditions.  The daughter just left.  The patient says she is feeling better.  I will try again later today.  Vesta Mixer, Montez Hageman., MD, [facility] 10/20/2012, 1:13 PM Office - [phone] Pager [phone]

## 2012-10-20 NOTE — Progress Notes (Signed)
Patient states her head feels funny spit up  Small amount of hemoptysis  B/P 172/95 Dr. Jerral Ralph made aware. Daughter at bedside. Dr. Jerral Ralph in to see patient and talk with daughter

## 2012-10-20 NOTE — Progress Notes (Signed)
Utilization review complete 

## 2012-10-20 NOTE — Progress Notes (Signed)
Per MD on call it's ok to give pt her meds with sips of water otherwise keep NPO.

## 2012-10-20 NOTE — Progress Notes (Signed)
PATIENT DETAILS Name: [name] Age: 78 y.o. Sex: female Date of Birth: 10/01/1922 Admit Date: 10/16/2012 Admitting Physician Ripudeep Jenna Luo, MD AVW:[id number], Sherilyn Cooter, MD  Subjective:  Right shoulder pain-better today, however had numerous episodes of nausea and vomiting. Having burning chest pain  Assessment/Plan: Principal Problem:  *Acute hypoxic respiratory failure  -2/2 HCAP with necrosis -titrate of O2 as tolerated -on admission-required BIPAP support  Active Problems: Healthcare-associated pneumonia- necrotizing -Afebrile, leukocytosis has resolved -C/w Vanco/Primaxin-started 10/24 -sputum c/s-not diagnostic -blood culture on 10/24-no growth to date  NSTEMI -more likely demand ischemia -likely has underlying CAD -cardiology recommending conservative treatment -on ASA,Plavix, Metoprolol and Statins -Echo-10/24-shows moderate hypokinesis in ant wall and apex  Chest Pain -likely GI-as had numerous episodes of vomiting/retching overnight -getting EKG  Vomiting -?etiology -stop Tramadol -Abd Xray-no significant changes -change to liquid diet, add Reglan -supportive care and follow clinically  CHF -both systolic and diastolic -EF per BJYN-82-95 % -clinically compensated-now off Lasix -taper off IVF  Hemoptysis -seems to have mostly resolved-some blackish/green sputum coming up-no fresh blood -2/2 to PNA-c/w Antibiotics  Right Shoulder Pain -exam shows no swelling or overlying erythema -suspect OA -Xray shoulder-no significant abnormalities -better with Tylenol -heat pad   Peripheral arterial disease -c/w ASA, plavix  CKD -stage 3 -monitor lytes periodically  Dementia -stable -c/w Aricept  Anxiety -some anxiety 2/2 to right shoulder pain -as needed Xanax  Disposition: Remain inpatient  DVT Prophylaxis: Prophylactic Lovenox   Code Status: DNR  Procedures:  None  CONSULTS:  cardiology and pulmonary/intensive care  PHYSICAL  EXAM: Vital signs in last 24 hours: Filed Vitals:   10/20/12 0310 10/20/12 0414 10/20/12 1057 10/20/12 1117  BP: 143/86 142/84 172/95   Pulse: 73 76 86   Temp: 97.4 F (36.3 C) 97.5 F (36.4 C)  97.3 F (36.3 C)  TempSrc: Oral Oral  Oral  Resp: 18 18    Height:      Weight:  48.671 kg (107 lb 4.8 oz)    SpO2: 93% 95%      Weight change: 1.27 kg (2 lb 12.8 oz) Body mass index is 20.96 kg/(m^2).   Gen Exam: Awake and alert with clear speech.   Neck: Supple, No JVD.   Chest: Rales at left base CVS: S1 S2 Regular, no murmurs.  Abdomen: soft, BS +, non tender, non distended.  Extremities: no edema, lower extremities warm to touch.Right shoulder-not tender to palpation, no overlying erythema, not swollen Neurologic: Non Focal.   Skin: No Rash.   Wounds: N/A.    Intake/Output from previous day:  Intake/Output Summary (Last 24 hours) at 10/20/12 1127 Last data filed at 10/20/12 0318  Gross per 24 hour  Intake    464 ml  Output    340 ml  Net    124 ml     LAB RESULTS: CBC  Lab 10/20/12 0948 10/17/12 0540 10/16/12 0835  WBC 7.6 9.1 16.3*  HGB 11.7* 10.6* 15.5*  HCT 36.6 33.4* 46.8*  PLT 164 152 299  MCV 87.6 87.9 88.8  MCH 28.0 27.9 29.4  MCHC 32.0 31.7 33.1  RDW 13.2 13.4 13.2  LYMPHSABS -- -- --  MONOABS -- -- --  EOSABS -- -- --  BASOSABS -- -- --  BANDABS -- -- --    Chemistries   Lab 10/20/12 0610 10/18/12 0530 10/17/12 1747 10/17/12 0540 10/16/12 0835  NA 133* 138 133* 137 137  K 5.7* 4.3 4.5 4.4 4.7  CL 100 108 102 105 102  CO2 24 23 20 22 23   GLUCOSE 115* 113* 153* 112* 199*  BUN 29* 28* 30* 30* 21  CREATININE 1.32* 1.57* 1.50* 1.81* 1.38*  CALCIUM 9.4 8.6 8.9 8.6 9.2  MG -- -- -- -- --    CBG: No results found for this basename: GLUCAP:5 in the last 168 hours  GFR Estimated Creatinine Clearance: 21.2 ml/min (by C-G formula based on Cr of 1.32).  Coagulation profile  Lab 10/16/12 0835  INR 1.06  PROTIME --    Cardiac Enzymes  Lab  10/17/12 0021 10/16/12 1830 10/16/12 1824 10/16/12 1452  CKMB 6.8* 8.8* -- 9.0*  TROPONINI 1.87* -- 1.04* 1.62*  MYOGLOBIN -- -- -- --    No components found with this basename: POCBNP:3 No results found for this basename: DDIMER:2 in the last 72 hours No results found for this basename: HGBA1C:2 in the last 72 hours No results found for this basename: CHOL:2,HDL:2,LDLCALC:2,TRIG:2,CHOLHDL:2,LDLDIRECT:2 in the last 72 hours No results found for this basename: TSH,T4TOTAL,FREET3,T3FREE,THYROIDAB in the last 72 hours No results found for this basename: VITAMINB12:2,FOLATE:2,FERRITIN:2,TIBC:2,IRON:2,RETICCTPCT:2 in the last 72 hours No results found for this basename: LIPASE:2,AMYLASE:2 in the last 72 hours  Urine Studies No results found for this basename: UACOL:2,UAPR:2,USPG:2,UPH:2,UTP:2,UGL:2,UKET:2,UBIL:2,UHGB:2,UNIT:2,UROB:2,ULEU:2,UEPI:2,UWBC:2,URBC:2,UBAC:2,CAST:2,CRYS:2,UCOM:2,BILUA:2 in the last 72 hours  MICROBIOLOGY: Recent Results (from the past 240 hour(s))  CULTURE, BLOOD (ROUTINE X 2)     Status: Normal (Preliminary result)   Collection Time   10/16/12  9:40 AM      Component Value Range Status Comment   Specimen Description BLOOD LEFT RADIAL   Final    Special Requests BOTTLES DRAWN AEROBIC ONLY 6CC   Final    Culture  Setup Time 10/16/2012 15:37   Final    Culture     Final    Value:        BLOOD CULTURE RECEIVED NO GROWTH TO DATE CULTURE WILL BE HELD FOR 5 DAYS BEFORE ISSUING A FINAL NEGATIVE REPORT   Report Status PENDING   Incomplete   CULTURE, BLOOD (ROUTINE X 2)     Status: Normal (Preliminary result)   Collection Time   10/16/12  9:50 AM      Component Value Range Status Comment   Specimen Description BLOOD ARM LEFT   Final    Special Requests     Final    Value: BOTTLES DRAWN AEROBIC AND ANAEROBIC 10CC AER 3CC ANA   Culture  Setup Time 10/16/2012 15:37   Final    Culture     Final    Value:        BLOOD CULTURE RECEIVED NO GROWTH TO DATE CULTURE WILL BE  HELD FOR 5 DAYS BEFORE ISSUING A FINAL NEGATIVE REPORT   Report Status PENDING   Incomplete   MRSA PCR SCREENING     Status: Normal   Collection Time   10/16/12  1:55 PM      Component Value Range Status Comment   MRSA by PCR NEGATIVE  NEGATIVE Final   URINE CULTURE     Status: Normal   Collection Time   10/16/12  4:32 PM      Component Value Range Status Comment   Specimen Description URINE, CLEAN CATCH   Final    Special Requests Normal   Final    Culture  Setup Time 10/16/2012 17:23   Final    Colony Count NO GROWTH   Final    Culture NO GROWTH   Final    Report Status 10/17/2012 FINAL   Final  CULTURE, EXPECTORATED SPUTUM-ASSESSMENT     Status: Normal   Collection Time   10/17/12  6:32 AM      Component Value Range Status Comment   Specimen Description SPUTUM   Final    Special Requests Normal   Final    Sputum evaluation     Final    Value: MICROSCOPIC FINDINGS SUGGEST THAT THIS SPECIMEN IS NOT REPRESENTATIVE OF LOWER RESPIRATORY SECRETIONS. PLEASE RECOLLECT.     Gram Stain Report Called to,Read Back By and Verified With: B BRADT,RN AT 0802 10/17/12 BY K BARR   Report Status 10/17/2012 FINAL   Final     RADIOLOGY STUDIES/RESULTS: Ct Angio Chest Pe W/cm &/or Wo Cm  10/16/2012  *RADIOLOGY REPORT*  Clinical Data: 78 year old female with peripheral artery disease. Hypertension.  Acute respiratory distress and hemoptysis.  CT ANGIOGRAPHY CHEST  Technique:  Multidetector CT imaging of the chest using the standard protocol during bolus administration of intravenous contrast. Multiplanar reconstructed images including MIPs were obtained and reviewed to evaluate the vascular anatomy.  Contrast: 80mL OMNIPAQUE IOHEXOL 350 MG/ML SOLN  Comparison: Portable chest radiograph the same day and earlier.  Findings: Good contrast bolus timing in the pulmonary arterial tree.  Mild respiratory motion artifact. No focal filling defect identified in the pulmonary arterial tree to suggest the presence  of acute pulmonary embolism.  Extensive left greater than right lower lobe pulmonary consolidation.  Within the consolidated lung, typically on the left enhancing pulmonary vasculature is noted.  No definite areas of contrast extravasation within the lungs are identified.  Scattered areas of gas within the consolidated lung on the left, some suspicious for early cavitary change, but no definite areas of pulmonary necrosis.  The right lower lobe is significantly less affected.  Underlying centrilobular emphysema.  Major airways are patent.  Cardiomegaly.  No pericardial effusion.  Only trace pleural effusions.  Negative visualized upper abdominal viscera.  No mediastinal lymphadenopathy.  Intermittent gaseous distention of the thoracic esophagus.  Negative visualized thoracic inlet.  No acute osseous abnormality identified.  IMPRESSION: 1. No evidence of acute pulmonary embolus.  No definite pulmonary vascular contrast extravasation identified in the abnormal left lung, see #2.  2.  Extensive left lower lobe consolidation.  Severe pneumonia or aspiration could have this appearance.  Necrotizing pneumonia cannot be excluded.  3.  Comparatively mild right lower lobe consolidation.  Trace bilateral pleural effusions.  4.  Underlying emphysema.  Cardiomegaly.   Original Report Authenticated By: Harley Hallmark, M.D.    Dg Chest Port 1 View  10/17/2012  *RADIOLOGY REPORT*  Clinical Data: Pneumonia  PORTABLE CHEST - 1 VIEW  Comparison: 10/16/2012  Findings: Extensive airspace disease within the left mid and lower lung zones has improved.  Cardiomegaly persist.  No pneumothorax. Stable appearance of the right lung with scattered scarring verse atelectasis.  IMPRESSION: Improving left lower lobe pneumonia.   Original Report Authenticated By: Donavan Burnet, M.D.    Dg Chest Portable 1 View  10/16/2012  *RADIOLOGY REPORT*  Clinical Data: Shortness of breath, respiratory distress  PORTABLE CHEST - 1 VIEW  Comparison:  Portable exam 0857 hours compared to 06/13/2012  Findings: Minimal enlargement of cardiac silhouette. Mediastinal contours normal. Extensive airspace infiltrate identified in lingula and left lower lobe consistent with pneumonia. Aspiration not entirely excluded. Underlying emphysematous changes. Probable mild infiltrate at right base as well. No gross pleural effusion or pneumothorax. Diffuse osseous demineralization.  IMPRESSION: Extensive airspace consolidation involving the lingula and left lower lobe consistent  with pneumonia though aspiration not excluded. Minimal similar right basilar infiltrate.   Original Report Authenticated By: Lollie Marrow, M.D.    Dg Swallowing Func-speech Pathology  10/17/2012  Vivi Ferns McCoy, CCC-SLP     10/17/2012 12:07 PM Objective Swallowing Evaluation: Modified Barium Swallowing Study   Patient Details  Name: [name] MRN: [id number] Date of Birth: [birth year]  Today's Date: 10/17/2012 Time: 1478-2956 SLP Time Calculation (min): 20 min  Past Medical History:  Past Medical History  Diagnosis Date  . Hypertension   . Peripheral arterial disease   . Cognitive disorder   . Esophageal stricture   . Osteoarthritis   . Anxiety   . UTI (lower urinary tract infection) June 2013  . COPD (chronic obstructive pulmonary disease)    Past Surgical History:  Past Surgical History  Procedure Date  . Femoral bypass   . Esophagogastroduodenoscopy 06/21/2012    Procedure: ESOPHAGOGASTRODUODENOSCOPY (EGD);  Surgeon: Theda Belfast, MD;  Location: [facility];  Service: Endoscopy;   Laterality: N/A;   HPI:  78 year old female who lives in an ALF. She has baseline  exertional dyspnea and has had cough (non-productive) for > 2  weeks which seems to be worse when she is recumbent. She also has  noticed the sensation of difficulty swallowing for the last 2  weeks (of note she has had prior esophageal dilations in past).  Was in usual stat of health until acutely the am of 10/24. States  had even  been seen on 10/23 w/ clean bill of health by rounding  NP at ALF. She denies sick exposure, fevers, chills, chest pain,  nausea or vomiting. Has had not LE swelling. No wt gain or loss.  She presented to the ER in acute distress requiring NIPPV.   Presented to ER on 10/24 with acute on chronic dyspnea,  hemoptysis: initially bright red, ~1/4 teaspoon in volume  estimated that she had a total of 6-8 episodes of this before it  started to subside: no more pink tinged. CXR indicates left  greater than right airspace disease.      Assessment / Plan / Recommendation Clinical Impression  Dysphagia Diagnosis: Suspected primary esophageal dysphagia;Mild  oral phase dysphagia;Mild pharyngeal phase dysphagia Clinical impression: Patient continues to present with a  suspected primary esophageal dysphagia with a mild, secondary,  oropharyngeal component. Overall function characterized by  intermittently delayed swallow initiation with larger thin liquid  bolus sizes, when combined with mildly decreased base of tongue  strength, decreased hyo-laryngeal elevation, excursion, and UES  relaxation, result in trace penetrates which are spontaneously  cleared. Otherwise, full airway protection noted. Spontaneous dry  swallow additonally successful to clear trace-minimal pharyngeal  residuals post swallow. Although combination of esophageal and  pharyngeal components does increase aspiration risk, patient  judged safe to resume a po diet with aspiration precautions at  this time. SLP will f/u closely at bedside for continued  education and diet tolerance.     Treatment Recommendation  Therapy as outlined in treatment plan below    Diet Recommendation Dysphagia 3 (Mechanical Soft);Thin liquid   Liquid Administration via: Cup;Straw Medication Administration: Whole meds with liquid Supervision: Patient able to self feed;Intermittent supervision  to cue for compensatory strategies Compensations: Slow rate;Small sips/bites Postural Changes  and/or Swallow Maneuvers: Seated upright 90  degrees;Upright 30-60 min after meal    Other  Recommendations Oral Care Recommendations: Oral care BID   Follow Up Recommendations  None    Frequency and Duration  min 2x/week  2 weeks       SLP Swallow Goals Patient will utilize recommended strategies during swallow to  increase swallowing safety with: Independent assistance Swallow Study Goal #2 - Progress: Not met   General HPI: 78 year old female who lives in an ALF. She has  baseline exertional dyspnea and has had cough (non-productive)  for > 2 weeks which seems to be worse when she is recumbent. She  also has noticed the sensation of difficulty swallowing for the  last 2 weeks (of note she has had prior esophageal dilations in  past). Was in usual stat of health until acutely the am of 10/24.  States had even been seen on 10/23 w/ clean bill of health by  rounding NP at ALF. She denies sick exposure, fevers, chills,  chest pain, nausea or vomiting. Has had not LE swelling. No wt  gain or loss. She presented to the ER in acute distress requiring  NIPPV.  Presented to ER on 10/24 with acute on chronic dyspnea,  hemoptysis: initially bright red, ~1/4 teaspoon in volume  estimated that she had a total of 6-8 episodes of this before it  started to subside: no more pink tinged. CXR indicates left  greater than right airspace disease.  Type of Study: Modified Barium Swallowing Study Reason for Referral: Objectively evaluate swallowing function Previous Swallow Assessment: Bedside swallow evaluation complete  during previous [facility] admission on 06/16/12 and indicated a suspected  primary esophageal dysphagia. Barium swallow complete during that  same admission indicated Marked esophageal dysmotility with  possible diffuse esophageal Diet Prior to this Study: NPO Temperature Spikes Noted: Yes Respiratory Status: Supplemental O2 delivered via (comment) (2 L  nasal cannula) History of Recent Intubation: No Behavior/Cognition:  Alert;Cooperative;Pleasant mood Oral Cavity - Dentition: Adequate natural dentition Oral Motor / Sensory Function: Impaired motor Oral impairment: Right lingual (tongue deviates to the right, no  other focal deficits) Self-Feeding Abilities: Able to feed self Patient Positioning: Upright in bed Baseline Vocal Quality: Clear Volitional Cough: Strong Volitional Swallow: Able to elicit Anatomy: Within functional limits Pharyngeal Secretions: Not observed secondary MBS    Reason for Referral Objectively evaluate swallowing function   Oral Phase Oral Preparation/Oral Phase Oral Phase: WFL   Pharyngeal Phase Pharyngeal Phase Pharyngeal Phase: Impaired Pharyngeal - Thin Pharyngeal - Thin Cup: Delayed swallow initiation;Premature  spillage to valleculae;Pharyngeal residue - valleculae;Pharyngeal  residue - pyriform sinuses;Reduced tongue base retraction Pharyngeal - Thin Straw: Delayed swallow initiation;Premature  spillage to pyriform sinuses;Reduced tongue base  retraction;Penetration/Aspiration before swallow;Pharyngeal  residue - pyriform sinuses;Pharyngeal residue - valleculae Penetration/Aspiration details (thin straw): Material enters  airway, CONTACTS cords then ejected out (spontaneous throat clear  to clear trace penetrates) Pharyngeal - Solids Pharyngeal - Puree: Reduced tongue base retraction;Pharyngeal  residue - valleculae Pharyngeal - Mechanical Soft: Reduced tongue base  retraction;Pharyngeal residue - valleculae Pharyngeal - Pill: Within functional limits (provided whole with  thin liquid)  Cervical Esophageal Phase    GO   Leah McCoy MA, CCC-SLP [phone]  Cervical Esophageal Phase Cervical Esophageal Phase: Impaired Cervical Esophageal Phase - Thin Thin Cup: Reduced cricopharyngeal relaxation Thin Straw: Reduced cricopharyngeal relaxation Cervical Esophageal Phase - Solids Puree: Reduced cricopharyngeal relaxation Mechanical Soft: Reduced cricopharyngeal relaxation Pill: Reduced cricopharyngeal  relaxation (mild)         McCoy Leah [name] 10/17/2012, 12:06 PM      MEDICATIONS: Scheduled Meds:    . acetaminophen  1,000 mg Oral TID  . aspirin  81 mg Oral  Daily  . cholecalciferol  1,000 Units Oral Daily  . clopidogrel  75 mg Oral Q breakfast  . donepezil  5 mg Oral QHS  . enoxaparin (LOVENOX) injection  30 mg Subcutaneous Q24H  . famotidine  20 mg Oral Daily  . feeding supplement  237 mL Oral BID BM  . Fluticasone-Salmeterol  1 puff Inhalation Q12H  . guaiFENesin  600 mg Oral BID  . imipenem-cilastatin  250 mg Intravenous Q12H  . isosorbide mononitrate  30 mg Oral Daily  . metoCLOPramide (REGLAN) injection  5 mg Intravenous Q8H  . metoprolol tartrate  25 mg Oral BID  . mirtazapine  7.5 mg Oral QHS  . montelukast  10 mg Oral q morning - 10a  . pantoprazole  40 mg Oral Q1200  . simvastatin  10 mg Oral q1800  . sodium chloride  2 spray Nasal q morning - 10a  . sodium polystyrene  30 g Oral Once  . tetrahydrozoline  1 drop Both Eyes QID  . vancomycin  500 mg Intravenous Q48H  . DISCONTD: enoxaparin (LOVENOX) injection  40 mg Subcutaneous Q24H  . DISCONTD: famotidine  20 mg Oral BID  . DISCONTD: isosorbide mononitrate  30 mg Oral Daily   Continuous Infusions:    . sodium chloride 40 mL/hr at 10/20/12 1053  . dextrose 5 % and 0.45% NaCl 40 mL/hr at 10/19/12 0903   PRN Meds:.acetaminophen, ALPRAZolam, dextromethorphan, gi cocktail, ipratropium, levalbuterol, ondansetron (ZOFRAN) IV, polyvinyl alcohol, DISCONTD: traMADol  Antibiotics: Anti-infectives     Start     Dose/Rate Route Frequency Ordered Stop   10/18/12 1000   levofloxacin (LEVAQUIN) IVPB 500 mg  Status:  Discontinued        500 mg 100 mL/hr over 60 Minutes Intravenous Every 48 hours 10/16/12 1414 10/16/12 1446   10/18/12 1000   vancomycin (VANCOCIN) 500 mg in sodium chloride 0.9 % 100 mL IVPB        500 mg 100 mL/hr over 60 Minutes Intravenous Every 48 hours 10/16/12 1415     10/16/12 1600    imipenem-cilastatin (PRIMAXIN) 250 mg in sodium chloride 0.9 % 100 mL IVPB        250 mg 200 mL/hr over 30 Minutes Intravenous Every 12 hours 10/16/12 1456     10/16/12 1345   levofloxacin (LEVAQUIN) IVPB 750 mg  Status:  Discontinued        750 mg 100 mL/hr over 90 Minutes Intravenous Every 24 hours 10/16/12 1344 10/16/12 1348   10/16/12 1015   vancomycin (VANCOCIN) IVPB 1000 mg/200 mL premix        1,000 mg 200 mL/hr over 60 Minutes Intravenous  Once 10/16/12 1006 10/16/12 1601   10/16/12 0930   levofloxacin (LEVAQUIN) IVPB 750 mg  Status:  Discontinued        750 mg 100 mL/hr over 90 Minutes Intravenous Every 24 hours 10/16/12 0925 10/16/12 1414           Jeoffrey Massed, MD  [facility] Pager:[phone]  If 7PM-7AM, please contact night-coverage [email or web address] Password [email or web address] 10/20/2012, 11:27 AM   LOS: 4 days

## 2012-10-20 NOTE — Progress Notes (Signed)
Pt c/o nausea spitting up saliva with brownish color, zofran 4mg  IV given, noted abdominal distention & hypoactive bowel sounds, Pt has had several small BM's since admission with small to loose consistency. Daphane Shepherd notified and orders entered by M. Lynch for abd xray in addition to previously scheduled chest xray this AM. VS - Blood pressure 143/86, pulse 73, temperature 97.4 F (36.3 C), temperature source Oral, resp. rate 18, height 5' (1.524 m), weight 47.401 kg (104 lb 8 oz), SpO2 93.00%., O2   @ 3L . Pt in bed comfortable and resting, zofran effective. [name] [facility]

## 2012-10-20 NOTE — Progress Notes (Signed)
4098 Patient stated she is feeling a burning sensation in her chest Dr. Jerral Ralph made aware. Daughter at bedside.

## 2012-10-20 NOTE — Progress Notes (Signed)
Patient admitted from [facility] and plans are to return there at d/c- PT following and recommend [facility] PT f/u- will update and advise ALF of this- Reece Levy, MSW, [employer] [phone]

## 2012-10-20 NOTE — Progress Notes (Signed)
1915 Lab called and reported Troponin level 0.37 spoke with Maurine Minister in lab. Dr. Jerral Ralph made aware.

## 2012-10-20 NOTE — Progress Notes (Signed)
PT Cancellation Note  Patient Details Name: [name] MRN: [id number] DOB: 01-10-1922   Cancelled Treatment:    Reason Eval/Treat Not Completed: Medical issues which prohibited therapy.  Noted pt with CP today and changes on EKG.  Will hold PT at this time.  If plan becomes Palliative, please advise if pt/family is still wishing PT involvement or if PT should sign off.  Thanks.     Sunny Schlein, [place] [phone] 10/20/2012, 2:20 PM

## 2012-10-20 NOTE — Progress Notes (Signed)
Speech Language Pathology Dysphagia Treatment Patient Details Name: [name] MRN: [id number] DOB: 21-Dec-1922 Today's Date: 10/20/2012 Time: 1415-1430 SLP Time Calculation (min): 15 min  Assessment / Plan / Recommendation Clinical Impression  Treatment focused on caregiver education as patient lethargic this pm following events of this am. Daughter present and supportive. Reviewed results of MBS including diet recommendations, compensatory strategies, and aspiration precautions. Daughter verbalized understanding however requested that SLP f/u x1 with patient for f/u education.     Diet Recommendation  Continue with Current Diet: Dysphagia 3 (mechanical soft);Thin liquid    SLP Plan Continue with current plan of care   Pertinent Vitals/Pain n/a   Swallowing Goals  SLP Swallowing Goals Patient will utilize recommended strategies during swallow to increase swallowing safety with: Independent assistance Swallow Study Goal #2 - Progress: Progressing toward goal  General Temperature Spikes Noted: No Respiratory Status: Supplemental O2 delivered via (comment) (3 L nasal cannula) Behavior/Cognition: Lethargic Oral Cavity - Dentition: Adequate natural dentition Patient Positioning: Upright in bed      Dysphagia Treatment Treatment focused on: Patient/family/caregiver education Patient observed directly with PO's: No Reason PO's not observed: [employer] MA, CCC-SLP [phone]  [name] [name] 10/20/2012, 2:47 PM

## 2012-10-20 NOTE — Progress Notes (Signed)
PROGRESS NOTE  Subjective:   Ms. Verbeek is an 78 yo frail female admitted with necrotizing pneumonia.  She has also had lots of nausea and vomiting.    She complained of some chest pain and an ECG was ordered. Objective:    Vital Signs:   Temp:  [96.9 F (36.1 C)-97.6 F (36.4 C)] 97.5 F (36.4 C) (10/28 0414) Pulse Rate:  [73-82] 76  (10/28 0414) Resp:  [18-20] 18  (10/28 0414) BP: (136-147)/(78-86) 142/84 mmHg (10/28 0414) SpO2:  [93 %-95 %] 95 % (10/28 0414) Weight:  [107 lb 4.8 oz (48.671 kg)] 107 lb 4.8 oz (48.671 kg) (10/28 0414)  Last BM Date: 10/20/12   24-hour weight change: Weight change: 2 lb 12.8 oz (1.27 kg)  Weight trends: Filed Weights   10/16/12 1345 10/19/12 0527 10/20/12 0414  Weight: 92 lb 9.5 oz (42 kg) 104 lb 8 oz (47.401 kg) 107 lb 4.8 oz (48.671 kg)    Intake/Output:  10/27 0701 - 10/28 0700 In: 464 [P.O.:360; IV Piggyback:104] Out: 440 [Urine:400; Emesis/NG output:40]     Physical Exam: BP 142/84  Pulse 76  Temp 97.5 F (36.4 C) (Oral)  Resp 18  Ht 5' (1.524 m)  Wt 107 lb 4.8 oz (48.671 kg)  BMI 20.96 kg/m2  SpO2 95%  General: Vital signs reviewed and noted. Hard of hearing  Head: Normocephalic, atraumatic.  Eyes: conjunctivae/corneas clear.  EOM's intact.   Throat: normal  Neck: Supple. Normal carotids. No JVD  Lungs:  Rales bilaterally, L > R  Heart: Regular rate,  With normal  S1 S2. No murmurs, gallops or rubs  Abdomen:  Soft, non-tender, non-distended with normoactive bowel sounds. No hepatomegaly. No rebound/guarding. No abdominal masses.  Extremities: Distal pedal pulses are 2+ .  No edema.    Neurologic: A&O X3, CN II - XII are grossly intact. Motor strength is 5/5 in the all 4 extremities.  Psych: Responds to questions appropriately with normal affect.    Labs: BMET:  Basename 10/20/12 0610 10/18/12 0530  NA 133* 138  K 5.7* 4.3  CL 100 108  CO2 24 23  GLUCOSE 115* 113*  BUN 29* 28*  CREATININE 1.32* 1.57*    CALCIUM 9.4 8.6  MG -- --  PHOS -- --    Liver function tests: No results found for this basename: AST:2,ALT:2,ALKPHOS:2,BILITOT:2,PROT:2,ALBUMIN:2 in the last 72 hours No results found for this basename: LIPASE:2,AMYLASE:2 in the last 72 hours  CBC: No results found for this basename: WBC:2,NEUTROABS:2,HGB:2,HCT:2,MCV:2,PLT:2 in the last 72 hours  Cardiac Enzymes: No results found for this basename: CKTOTAL:4,CKMB:4,TROPONINI:4 in the last 72 hours  Coagulation Studies: No results found for this basename: LABPROT:5,INR:5 in the last 72 hours    Tele: not on tele  ECG: pending    Medications:    Infusions:    . sodium chloride    . dextrose 5 % and 0.45% NaCl 40 mL/hr at 10/19/12 0454    Scheduled Medications:    . acetaminophen  1,000 mg Oral TID  . aspirin  81 mg Oral Daily  . cholecalciferol  1,000 Units Oral Daily  . clopidogrel  75 mg Oral Q breakfast  . donepezil  5 mg Oral QHS  . enoxaparin (LOVENOX) injection  30 mg Subcutaneous Q24H  . famotidine  20 mg Oral Daily  . feeding supplement  237 mL Oral BID BM  . Fluticasone-Salmeterol  1 puff Inhalation Q12H  . guaiFENesin  600 mg Oral BID  . imipenem-cilastatin  250 mg Intravenous Q12H  . metoCLOPramide (REGLAN) injection  5 mg Intravenous Q8H  . metoprolol tartrate  25 mg Oral BID  . mirtazapine  7.5 mg Oral QHS  . montelukast  10 mg Oral q morning - 10a  . pantoprazole  40 mg Oral Q1200  . simvastatin  10 mg Oral q1800  . sodium chloride  2 spray Nasal q morning - 10a  . sodium polystyrene  30 g Oral Once  . tetrahydrozoline  1 drop Both Eyes QID  . vancomycin  500 mg Intravenous Q48H  . DISCONTD: enoxaparin (LOVENOX) injection  40 mg Subcutaneous Q24H  . DISCONTD: famotidine  20 mg Oral BID    Assessment/ Plan:    Acute hypoxic respiratory failure  (10/16/2012) Plans per medicine  Healthcare-associated pneumonia (10/16/2012)  Hemoptysis (10/16/2012)   NSTEMI, initial episode of care  (10/17/2012)  Pt has + cardiac enzymes.  Given her frailty, I would agree with medical therapy.  I think she is at increased risk for invasive / interventional procedures.  Continue Plavix, ASA,  metoprolol, Will add Imdur 30 QD.  Systolic and diastolic CHF, acute on chronic (10/17/2012) Echo shows EF of 40-45%.  She has anterio-septal wall motion abnormality.  Continue meds    Disposition:  Length of Stay: 4  Vesta Mixer, Montez Hageman., MD, [facility] 10/20/2012, 10:36 AM Office [phone] Pager [phone]

## 2012-10-20 NOTE — Progress Notes (Signed)
Patient was having nausea, vomting and "burning" chest pain earlier this am. Now having mostly lightheadedness. EKG done a while earlier-shows new T wave inversions in the inferior lateral leads. Imp-?ongoing ischemia Plan-spoke with Dr [name] Hashimoto, he reviewed EKG -and thought the patient would be a very poor candidate for LHC given her frailty and likelihood of having multiple vessel disease-likley requiring CABG. He suggested to continue with medical treatment. He will speak with daughter at bedside. I have spoken with the daughter and patient at bedside and have explained to them, regarding the above. I have suggested that if we dont get control of her symptoms with conservative measures, then we might need to consider Palliative care.Family understands, and do not want aggressive care, however patient's daughter will get in touch with her brother and then get back to me. Patient already on ASA, Plavix, statins, Imdur, and beta blockers Since no active chest pain-will not give morphine, but will order SL nitro-prn Anti-emetics as tolerated NPO for now IVF Place on telemetry

## 2012-10-21 ENCOUNTER — Inpatient Hospital Stay (HOSPITAL_COMMUNITY): Payer: Medicare Other

## 2012-10-21 LAB — CBC
MCH: 27.8 pg (ref 26.0–34.0)
Platelets: 144 10*3/uL — ABNORMAL LOW (ref 150–400)
RBC: 3.45 MIL/uL — ABNORMAL LOW (ref 3.87–5.11)

## 2012-10-21 LAB — BASIC METABOLIC PANEL
CO2: 23 mEq/L (ref 19–32)
Calcium: 8.5 mg/dL (ref 8.4–10.5)
GFR calc Af Amer: 35 mL/min — ABNORMAL LOW (ref >90)
GFR calc non Af Amer: 30 mL/min — ABNORMAL LOW (ref >90)
Sodium: 137 mEq/L (ref 135–145)

## 2012-10-21 LAB — TROPONIN I: Troponin I: 0.38 ng/mL (ref <0.30)

## 2012-10-21 MED ORDER — ALPRAZOLAM 0.25 MG PO TABS
0.2500 mg | ORAL_TABLET | Freq: Two times a day (BID) | ORAL | Status: DC
  Administered 2012-10-21 – 2012-10-24 (×7): 0.25 mg via ORAL
  Filled 2012-10-21 (×7): qty 1

## 2012-10-21 MED ORDER — SUCRALFATE 1 GM/10ML PO SUSP
1.0000 g | Freq: Three times a day (TID) | ORAL | Status: DC
  Administered 2012-10-21 – 2012-10-22 (×5): 1 g via ORAL
  Filled 2012-10-21 (×9): qty 10

## 2012-10-21 NOTE — Care Management Note (Addendum)
    Page 1 of 1   10/24/2012     3:12:30 PM   CARE MANAGEMENT NOTE 10/23/2012  Patient:  [name], [name]   Account Number:  [id number]  Date Initiated:  10/21/2012  Documentation initiated by:  Letha Cape  Subjective/Objective Assessment:   dx acute hypoxic res failure  admit- from [facility].     Action/Plan:   pt eval- recs hhpt   Anticipated DC Date:  10/23/2012   Anticipated DC Plan:  SKILLED NURSING FACILITY  In-house referral  Clinical Social Worker      DC Planning Services  CM consult      Choice offered to / List presented to:             Status of service:  Completed, signed off Medicare Important Message given?   (If response is "NO", the following Medicare IM given date fields will be blank) Date Medicare IM given:   Date Additional Medicare IM given:    Discharge Disposition:  SKILLED NURSING FACILITY  Per UR Regulation:  Reviewed for med. necessity/level of care/duration of stay  If discussed at Long Length of Stay Meetings, dates discussed:   10/23/2012    Comments:  10/23/12 9:49 Letha Cape RN, BSN [phone] patient is for dc to snf today, CSW following.  10/21/12 14:19 Letha Cape RN, BSN [phone] patient is form [facility]. Patient with ongoing ischemia, resp failure, copd, anxiety issues, n/v, shoulder pain, necrotic pna and MI.  Per physical therapy recs hhpt, NCM will continue to follow for dc needs.

## 2012-10-21 NOTE — Progress Notes (Signed)
PATIENT DETAILS Name: Shylynn Bruning Age: 78 y.o. Sex: female Date of Birth: 09-24-1922 Admit Date: 10/16/2012 Admitting Physician Ripudeep Jenna Luo, MD [id number], Sherilyn Cooter, MD  Subjective: Feeling much better today. Slept well. Had a "blow out" bowel movement at 3:00 am.  Still has some burning in her throat. Shoulder pain relieved with scheduled tylenol and heating pad.  Assessment/Plan: Principal Problem:  *Acute hypoxic respiratory failure  -2/2 HCAP with necrosis -titrate of O2 as tolerated -on admission-required BIPAP support -breathing comfortably 10/29.  Active Problems: Healthcare-associated pneumonia- necrotizing -Afebrile, leukocytosis has resolved -C/w Vanco/Primaxin-started 10/24 plan to change to PO levaquin 10/30. -sputum c/s-not diagnostic -blood culture on 10/24-no growth to date  New EKG changes -Flipped her T waves in inf-lateral leads on 10/28-she was having burning chest pain and vomiting during this time -see notes from 10/28 -plan for medical management only  NSTEMI -more likely demand ischemia -likely has underlying CAD -cardiology recommending conservative treatment -on ASA,Plavix, Metoprolol and Statins -Echo-10/24-shows moderate hypokinesis in ant wall and apex and diastolic dysfunction.  Vomiting -not sure if this was related to NSTEMI -big issue 10/28. Kept NPO overnight and given IVF -resolved 10/29.  Will trial clear liquids and start carafate. -stop Tramadol -Abd Xray-no significant changes -change to liquid diet, stop Phenergan -supportive care and follow clinically  CHF -both systolic and diastolic -EF per VWUJ-81-19 % -clinically compensated-now off Lasix -taper off IVF  Hemoptysis - resolved-some blackish/green sputum coming up-no fresh blood -2/2 to PNA-c/w Antibiotics  Anemia -Hbg dropped 2 grams overnight. (10/29)-think this is 2/2 to IVF -Uncertain etiology.  Will likely be G+ due to recent hemoptysis. -Will monitor for any  further drop and transfuse if needed.  Right Shoulder Pain -exam shows no swelling or overlying erythema -suspect OA -Xray shoulder-no significant abnormalities -better with Tylenol - patient has no complaints of pain with continued tylenol and heating pad.   Peripheral arterial disease -c/w ASA, plavix  CKD -stage 3 -monitor lytes periodically  Dementia -stable -c/w Aricept  Anxiety -some anxiety 2/2 to right shoulder pain -scheduled Xanax  Social -This family would benefit from a Palliative medicine consultation.  I will offer.  Disposition: -Remain inpatient.  Have requested that PT work with patient despite elevated troponins.  She is cleared for activity as tolerated (at least out of bed to chair).  Her goal is to return to the ALF.  DVT Prophylaxis: Prophylactic Lovenox   Code Status: DNR  Procedures:  None  CONSULTS:  cardiology and pulmonary/intensive care  PHYSICAL EXAM: Vital signs in last 24 hours: Filed Vitals:   10/20/12 2142 10/20/12 2145 10/21/12 0440 10/21/12 0446  BP: 133/73 133/78 127/69   Pulse:  87 71   Temp:  98.3 F (36.8 C) 97.1 F (36.2 C)   TempSrc:  Oral Axillary   Resp:  18 18   Height:      Weight:    50.984 kg (112 lb 6.4 oz)  SpO2:  90% 94%     Weight change: 2.313 kg (5 lb 1.6 oz) Body mass index is 21.95 kg/(m^2).   Gen Exam: Awake and alert with clear speech.   Neck: Supple, No JVD.   Chest: Rales at left base CVS: S1 S2 Regular, no murmurs.  Abdomen: soft, BS +, non tender, non distended.  Extremities: no edema, lower extremities warm to touch.Right shoulder-not tender to palpation, no overlying erythema, not swollen Neurologic: Non Focal.   Skin: No Rash.   Wounds: N/A.    Intake/Output from previous  day:  Intake/Output Summary (Last 24 hours) at 10/21/12 0804 Last data filed at 10/21/12 0109  Gross per 24 hour  Intake 951.33 ml  Output    100 ml  Net 851.33 ml     LAB RESULTS: CBC  Lab 10/21/12  0039 10/20/12 0948 10/17/12 0540 10/16/12 0835  WBC 6.1 7.6 9.1 16.3*  HGB 9.6* 11.7* 10.6* 15.5*  HCT 29.9* 36.6 33.4* 46.8*  PLT 144* 164 152 299  MCV 86.7 87.6 87.9 88.8  MCH 27.8 28.0 27.9 29.4  MCHC 32.1 32.0 31.7 33.1  RDW 13.3 13.2 13.4 13.2  LYMPHSABS -- -- -- --  MONOABS -- -- -- --  EOSABS -- -- -- --  BASOSABS -- -- -- --  BANDABS -- -- -- --    Chemistries   Lab 10/21/12 0039 10/20/12 0610 10/18/12 0530 10/17/12 1747 10/17/12 0540  NA 137 133* 138 133* 137  K 4.2 5.7* 4.3 4.5 4.4  CL 105 100 108 102 105  CO2 23 24 23 20 22   GLUCOSE 95 115* 113* 153* 112*  BUN 34* 29* 28* 30* 30*  CREATININE 1.50* 1.32* 1.57* 1.50* 1.81*  CALCIUM 8.5 9.4 8.6 8.9 8.6  MG -- -- -- -- --    Coagulation profile  Lab 10/16/12 0835  INR 1.06  PROTIME --    Cardiac Enzymes  Lab 10/21/12 0039 10/20/12 1815 10/20/12 1302 10/17/12 0021 10/16/12 1830 10/16/12 1452  CKMB -- -- -- 6.8* 8.8* 9.0*  TROPONINI 0.38* 0.37* <0.30 -- -- --  MYOGLOBIN -- -- -- -- -- --     MICROBIOLOGY: Recent Results (from the past 240 hour(s))  CULTURE, BLOOD (ROUTINE X 2)     Status: Normal (Preliminary result)   Collection Time   10/16/12  9:40 AM      Component Value Range Status Comment   Specimen Description BLOOD LEFT RADIAL   Final    Special Requests BOTTLES DRAWN AEROBIC ONLY 6CC   Final    Culture  Setup Time 10/16/2012 15:37   Final    Culture     Final    Value:        BLOOD CULTURE RECEIVED NO GROWTH TO DATE CULTURE WILL BE HELD FOR 5 DAYS BEFORE ISSUING A FINAL NEGATIVE REPORT   Report Status PENDING   Incomplete   CULTURE, BLOOD (ROUTINE X 2)     Status: Normal (Preliminary result)   Collection Time   10/16/12  9:50 AM      Component Value Range Status Comment   Specimen Description BLOOD ARM LEFT   Final    Special Requests     Final    Value: BOTTLES DRAWN AEROBIC AND ANAEROBIC 10CC AER 3CC ANA   Culture  Setup Time 10/16/2012 15:37   Final    Culture     Final    Value:         BLOOD CULTURE RECEIVED NO GROWTH TO DATE CULTURE WILL BE HELD FOR 5 DAYS BEFORE ISSUING A FINAL NEGATIVE REPORT   Report Status PENDING   Incomplete   MRSA PCR SCREENING     Status: Normal   Collection Time   10/16/12  1:55 PM      Component Value Range Status Comment   MRSA by PCR NEGATIVE  NEGATIVE Final   URINE CULTURE     Status: Normal   Collection Time   10/16/12  4:32 PM      Component Value Range Status Comment   Specimen  Description URINE, CLEAN CATCH   Final    Special Requests Normal   Final    Culture  Setup Time 10/16/2012 17:23   Final    Colony Count NO GROWTH   Final    Culture NO GROWTH   Final    Report Status 10/17/2012 FINAL   Final   CULTURE, EXPECTORATED SPUTUM-ASSESSMENT     Status: Normal   Collection Time   10/17/12  6:32 AM      Component Value Range Status Comment   Specimen Description SPUTUM   Final    Special Requests Normal   Final    Sputum evaluation     Final    Value: MICROSCOPIC FINDINGS SUGGEST THAT THIS SPECIMEN IS NOT REPRESENTATIVE OF LOWER RESPIRATORY SECRETIONS. PLEASE RECOLLECT.     Gram Stain Report Called to,Read Back By and Verified With: B BRADT,RN AT 0802 10/17/12 BY K BARR   Report Status 10/17/2012 FINAL   Final     RADIOLOGY STUDIES/RESULTS: Ct Angio Chest Pe W/cm &/or Wo Cm  10/16/2012  *RADIOLOGY REPORT*  Clinical Data: 78 year old female with peripheral artery disease. Hypertension.  Acute respiratory distress and hemoptysis.  CT ANGIOGRAPHY CHEST  Technique:  Multidetector CT imaging of the chest using the standard protocol during bolus administration of intravenous contrast. Multiplanar reconstructed images including MIPs were obtained and reviewed to evaluate the vascular anatomy.  Contrast: 80mL OMNIPAQUE IOHEXOL 350 MG/ML SOLN  Comparison: Portable chest radiograph the same day and earlier.  Findings: Good contrast bolus timing in the pulmonary arterial tree.  Mild respiratory motion artifact. No focal filling defect  identified in the pulmonary arterial tree to suggest the presence of acute pulmonary embolism.  Extensive left greater than right lower lobe pulmonary consolidation.  Within the consolidated lung, typically on the left enhancing pulmonary vasculature is noted.  No definite areas of contrast extravasation within the lungs are identified.  Scattered areas of gas within the consolidated lung on the left, some suspicious for early cavitary change, but no definite areas of pulmonary necrosis.  The right lower lobe is significantly less affected.  Underlying centrilobular emphysema.  Major airways are patent.  Cardiomegaly.  No pericardial effusion.  Only trace pleural effusions.  Negative visualized upper abdominal viscera.  No mediastinal lymphadenopathy.  Intermittent gaseous distention of the thoracic esophagus.  Negative visualized thoracic inlet.  No acute osseous abnormality identified.  IMPRESSION: 1. No evidence of acute pulmonary embolus.  No definite pulmonary vascular contrast extravasation identified in the abnormal left lung, see #2.  2.  Extensive left lower lobe consolidation.  Severe pneumonia or aspiration could have this appearance.  Necrotizing pneumonia cannot be excluded.  3.  Comparatively mild right lower lobe consolidation.  Trace bilateral pleural effusions.  4.  Underlying emphysema.  Cardiomegaly.   Original Report Authenticated By: Harley Hallmark, M.D.    Dg Chest Port 1 View  10/17/2012  *RADIOLOGY REPORT*  Clinical Data: Pneumonia  PORTABLE CHEST - 1 VIEW  Comparison: 10/16/2012  Findings: Extensive airspace disease within the left mid and lower lung zones has improved.  Cardiomegaly persist.  No pneumothorax. Stable appearance of the right lung with scattered scarring verse atelectasis.  IMPRESSION: Improving left lower lobe pneumonia.   Original Report Authenticated By: Donavan Burnet, M.D.    Dg Chest Portable 1 View  10/16/2012  *RADIOLOGY REPORT*  Clinical Data: Shortness of  breath, respiratory distress  PORTABLE CHEST - 1 VIEW  Comparison: Portable exam 0857 hours compared to 06/13/2012  Findings: Minimal enlargement of cardiac silhouette. Mediastinal contours normal. Extensive airspace infiltrate identified in lingula and left lower lobe consistent with pneumonia. Aspiration not entirely excluded. Underlying emphysematous changes. Probable mild infiltrate at right base as well. No gross pleural effusion or pneumothorax. Diffuse osseous demineralization.  IMPRESSION: Extensive airspace consolidation involving the lingula and left lower lobe consistent with pneumonia though aspiration not excluded. Minimal similar right basilar infiltrate.   Original Report Authenticated By: Lollie Marrow, M.D.    Dg Swallowing Func-speech Pathology  10/17/2012  Vivi Ferns McCoy, CCC-SLP     10/17/2012 12:07 PM Objective Swallowing Evaluation: Modified Barium Swallowing Study   Patient Details  Name: Saadia Dewitt MRN: [id number] Date of Birth: Feb 05, 1922  Today's Date: 10/17/2012 Time: 5366-4403 SLP Time Calculation (min): 20 min  Past Medical History:  Past Medical History  Diagnosis Date  . Hypertension   . Peripheral arterial disease   . Cognitive disorder   . Esophageal stricture   . Osteoarthritis   . Anxiety   . UTI (lower urinary tract infection) June 2013  . COPD (chronic obstructive pulmonary disease)    Past Surgical History:  Past Surgical History  Procedure Date  . Femoral bypass   . Esophagogastroduodenoscopy 06/21/2012    Procedure: ESOPHAGOGASTRODUODENOSCOPY (EGD);  Surgeon: Theda Belfast, MD;  Location: [facility];  Service: Endoscopy;   Laterality: N/A;   HPI:  78 year old female who lives in an ALF. She has baseline  exertional dyspnea and has had cough (non-productive) for > 2  weeks which seems to be worse when she is recumbent. She also has  noticed the sensation of difficulty swallowing for the last 2  weeks (of note she has had prior esophageal dilations in past).  Was in  usual stat of health until acutely the am of 10/24. States  had even been seen on 10/23 w/ clean bill of health by rounding  NP at ALF. She denies sick exposure, fevers, chills, chest pain,  nausea or vomiting. Has had not LE swelling. No wt gain or loss.  She presented to the ER in acute distress requiring NIPPV.   Presented to ER on 10/24 with acute on chronic dyspnea,  hemoptysis: initially bright red, ~1/4 teaspoon in volume  estimated that she had a total of 6-8 episodes of this before it  started to subside: no more pink tinged. CXR indicates left  greater than right airspace disease.      Assessment / Plan / Recommendation Clinical Impression  Dysphagia Diagnosis: Suspected primary esophageal dysphagia;Mild  oral phase dysphagia;Mild pharyngeal phase dysphagia Clinical impression: Patient continues to present with a  suspected primary esophageal dysphagia with a mild, secondary,  oropharyngeal component. Overall function characterized by  intermittently delayed swallow initiation with larger thin liquid  bolus sizes, when combined with mildly decreased base of tongue  strength, decreased hyo-laryngeal elevation, excursion, and UES  relaxation, result in trace penetrates which are spontaneously  cleared. Otherwise, full airway protection noted. Spontaneous dry  swallow additonally successful to clear trace-minimal pharyngeal  residuals post swallow. Although combination of esophageal and  pharyngeal components does increase aspiration risk, patient  judged safe to resume a po diet with aspiration precautions at  this time. SLP will f/u closely at bedside for continued  education and diet tolerance.     Treatment Recommendation  Therapy as outlined in treatment plan below    Diet Recommendation Dysphagia 3 (Mechanical Soft);Thin liquid   Liquid Administration via: Cup;Straw Medication Administration: Whole meds  with liquid Supervision: Patient able to self feed;Intermittent supervision  to cue for compensatory  strategies Compensations: Slow rate;Small sips/bites Postural Changes and/or Swallow Maneuvers: Seated upright 90  degrees;Upright 30-60 min after meal    Other  Recommendations Oral Care Recommendations: Oral care BID   Follow Up Recommendations  None    Frequency and Duration min 2x/week  2 weeks       SLP Swallow Goals Patient will utilize recommended strategies during swallow to  increase swallowing safety with: Independent assistance Swallow Study Goal #2 - Progress: Not met   General HPI: 78 year old female who lives in an ALF. She has  baseline exertional dyspnea and has had cough (non-productive)  for > 2 weeks which seems to be worse when she is recumbent. She  also has noticed the sensation of difficulty swallowing for the  last 2 weeks (of note she has had prior esophageal dilations in  past). Was in usual stat of health until acutely the am of 10/24.  States had even been seen on 10/23 w/ clean bill of health by  rounding NP at ALF. She denies sick exposure, fevers, chills,  chest pain, nausea or vomiting. Has had not LE swelling. No wt  gain or loss. She presented to the ER in acute distress requiring  NIPPV.  Presented to ER on 10/24 with acute on chronic dyspnea,  hemoptysis: initially bright red, ~1/4 teaspoon in volume  estimated that she had a total of 6-8 episodes of this before it  started to subside: no more pink tinged. CXR indicates left  greater than right airspace disease.  Type of Study: Modified Barium Swallowing Study Reason for Referral: Objectively evaluate swallowing function Previous Swallow Assessment: Bedside swallow evaluation complete  during previous [facility] admission on 06/16/12 and indicated a suspected  primary esophageal dysphagia. Barium swallow complete during that  same admission indicated Marked esophageal dysmotility with  possible diffuse esophageal Diet Prior to this Study: NPO Temperature Spikes Noted: Yes Respiratory Status: Supplemental O2 delivered via (comment) (2 L   nasal cannula) History of Recent Intubation: No Behavior/Cognition: Alert;Cooperative;Pleasant mood Oral Cavity - Dentition: Adequate natural dentition Oral Motor / Sensory Function: Impaired motor Oral impairment: Right lingual (tongue deviates to the right, no  other focal deficits) Self-Feeding Abilities: Able to feed self Patient Positioning: Upright in bed Baseline Vocal Quality: Clear Volitional Cough: Strong Volitional Swallow: Able to elicit Anatomy: Within functional limits Pharyngeal Secretions: Not observed secondary MBS    Reason for Referral Objectively evaluate swallowing function   Oral Phase Oral Preparation/Oral Phase Oral Phase: WFL   Pharyngeal Phase Pharyngeal Phase Pharyngeal Phase: Impaired Pharyngeal - Thin Pharyngeal - Thin Cup: Delayed swallow initiation;Premature  spillage to valleculae;Pharyngeal residue - valleculae;Pharyngeal  residue - pyriform sinuses;Reduced tongue base retraction Pharyngeal - Thin Straw: Delayed swallow initiation;Premature  spillage to pyriform sinuses;Reduced tongue base  retraction;Penetration/Aspiration before swallow;Pharyngeal  residue - pyriform sinuses;Pharyngeal residue - valleculae Penetration/Aspiration details (thin straw): Material enters  airway, CONTACTS cords then ejected out (spontaneous throat clear  to clear trace penetrates) Pharyngeal - Solids Pharyngeal - Puree: Reduced tongue base retraction;Pharyngeal  residue - valleculae Pharyngeal - Mechanical Soft: Reduced tongue base  retraction;Pharyngeal residue - valleculae Pharyngeal - Pill: Within functional limits (provided whole with  thin liquid)  Cervical Esophageal Phase    GO   Ferdinand Lango MA, CCC-SLP [phone]  Cervical Esophageal Phase Cervical Esophageal Phase: Impaired Cervical Esophageal Phase - Thin Thin Cup: Reduced cricopharyngeal relaxation Thin Straw: Reduced cricopharyngeal relaxation  Cervical Esophageal Phase - Solids Puree: Reduced cricopharyngeal relaxation Mechanical Soft:  Reduced cricopharyngeal relaxation Pill: Reduced cricopharyngeal relaxation (mild)         McCoy Leah [name] 10/17/2012, 12:06 PM      MEDICATIONS: Scheduled Meds:    . acetaminophen  1,000 mg Oral TID  . aspirin  81 mg Oral Daily  . cholecalciferol  1,000 Units Oral Daily  . clopidogrel  75 mg Oral Q breakfast  . donepezil  5 mg Oral QHS  . enoxaparin (LOVENOX) injection  30 mg Subcutaneous Q24H  . feeding supplement  237 mL Oral BID BM  . Fluticasone-Salmeterol  1 puff Inhalation Q12H  . guaiFENesin  600 mg Oral BID  . imipenem-cilastatin  250 mg Intravenous Q12H  . isosorbide mononitrate  30 mg Oral Daily  . metoCLOPramide (REGLAN) injection  5 mg Intravenous Q8H  . metoprolol tartrate  25 mg Oral BID  . mirtazapine  7.5 mg Oral QHS  . montelukast  10 mg Oral q morning - 10a  . pantoprazole (PROTONIX) IV  40 mg Intravenous Q12H  . simvastatin  10 mg Oral q1800  . sodium chloride  2 spray Nasal q morning - 10a  . sodium polystyrene  30 g Oral Once  . sucralfate  1 g Oral TID WC & HS  . tetrahydrozoline  1 drop Both Eyes QID  . vancomycin  500 mg Intravenous Q48H  . DISCONTD: enoxaparin (LOVENOX) injection  40 mg Subcutaneous Q24H  . DISCONTD: famotidine  20 mg Oral Daily  . DISCONTD: isosorbide mononitrate  30 mg Oral Daily  . DISCONTD: isosorbide mononitrate  30 mg Oral Daily  . DISCONTD: pantoprazole  40 mg Oral Q1200   Continuous Infusions:    . DISCONTD: sodium chloride 60 mL/hr at 10/21/12 0448   PRN Meds:.acetaminophen, ALPRAZolam, dextromethorphan, gi cocktail, ipratropium, levalbuterol, nitroGLYCERIN, ondansetron (ZOFRAN) IV, polyvinyl alcohol, promethazine, DISCONTD: traMADol  Antibiotics: Anti-infectives     Start     Dose/Rate Route Frequency Ordered Stop   10/18/12 1000   levofloxacin (LEVAQUIN) IVPB 500 mg  Status:  Discontinued        500 mg 100 mL/hr over 60 Minutes Intravenous Every 48 hours 10/16/12 1414 10/16/12 1446   10/18/12 1000   vancomycin  (VANCOCIN) 500 mg in sodium chloride 0.9 % 100 mL IVPB        500 mg 100 mL/hr over 60 Minutes Intravenous Every 48 hours 10/16/12 1415     10/16/12 1600   imipenem-cilastatin (PRIMAXIN) 250 mg in sodium chloride 0.9 % 100 mL IVPB        250 mg 200 mL/hr over 30 Minutes Intravenous Every 12 hours 10/16/12 1456     10/16/12 1345   levofloxacin (LEVAQUIN) IVPB 750 mg  Status:  Discontinued        750 mg 100 mL/hr over 90 Minutes Intravenous Every 24 hours 10/16/12 1344 10/16/12 1348   10/16/12 1015   vancomycin (VANCOCIN) IVPB 1000 mg/200 mL premix        1,000 mg 200 mL/hr over 60 Minutes Intravenous  Once 10/16/12 1006 10/16/12 1601   10/16/12 0930   levofloxacin (LEVAQUIN) IVPB 750 mg  Status:  Discontinued        750 mg 100 mL/hr over 90 Minutes Intravenous Every 24 hours 10/16/12 0925 10/16/12 1414           Conley Canal [facility] Pager:[phone]  If 7PM-7AM, please contact night-coverage [email or web address] Password [email or web address] 10/21/2012, 8:04 AM  LOS: 5 days    Attending Patient seen and examined, agree with above documentation.Yesterday she developed burning chest pain, persistent vomiting and new EKG changes suggestive of Ischemia. Troponins were only minimally elevated. Dr [name] Hashimoto did not recommend LHC, we then spoke to daughter and son, and explained plan for conservative care/medical management. Today she is much better, has some odynophagia-will continue with current antibiotics, meds and add carafate. If diet stable by 10/30-can change IV antibiotics to oral.  S Ghimire

## 2012-10-21 NOTE — Progress Notes (Signed)
Physical Therapy Treatment Patient Details Name: [name] MRN: [id number] DOB: 1922-08-02 Today's Date: 10/21/2012 Time: 6644-0347 PT Time Calculation (min): 31 min  PT Assessment / Plan / Recommendation Comments on Treatment Session  Pt with NSTEMI, PNA, respiratory failure who continues to have elevating troponin but cleared for activity. Pt with sats decreasing to 84% on 3L with sitting at EOB and on 6L sats dropping to 74% with in room ambulation and recovered to 98% on 5L once seated in chair after 1 min. On 5L maintained 93-96% with bil LE HEP. Pt limited by cardiopulmonary tolerance and encouraged to continue bil LE HEP and mobility with nursing. Will continue to follow.     Follow Up Recommendations        Does the patient have the potential to tolerate intense rehabilitation     Barriers to Discharge        Equipment Recommendations       Recommendations for Other Services    Frequency     Plan Discharge plan remains appropriate;Frequency remains appropriate    Precautions / Restrictions Precautions Precautions: Fall Precaution Comments: oxygen   Pertinent Vitals/Pain No pain    Mobility  Bed Mobility Bed Mobility: Supine to Sit;Sitting - Scoot to Edge of Bed Supine to Sit: 4: Min assist;HOB elevated;With rails (HOB 20 degrees) Sitting - Scoot to Edge of Bed: 4: Min assist Details for Bed Mobility Assistance: cueing and assist to pivot to EOB and use of pad to scoot to EOB Transfers Transfers: Sit to Stand;Stand to Sit Sit to Stand: 3: Mod assist;4: Min assist;From bed;From toilet Stand to Sit: To toilet;To chair/3-in-1;With armrests;4: Min assist Details for Transfer Assistance: min assist from bed and mod from low toilet with rail by commode and cueing to complete with assist to control descent to surface Ambulation/Gait Ambulation/Gait Assistance: 4: Min assist Ambulation Distance (Feet): 20 Feet (x 2 trials with seated rest between) Assistive device:  Rolling walker Ambulation/Gait Assistance Details: cueing for posture and position in RW with cueing for breathing technique.  Gait Pattern: Decreased stride length;Trunk flexed;Shuffle Gait velocity: decreased Stairs: No    Exercises General Exercises - Lower Extremity Ankle Circles/Pumps: AROM;Both;20 reps;Seated Long Arc Quad: AROM;Both;20 reps;Seated Hip ABduction/ADduction: AROM;Both;20 reps;Seated Hip Flexion/Marching: AROM;Both;20 reps;Seated   PT Diagnosis:    PT Problem List:   PT Treatment Interventions:     PT Goals Acute Rehab PT Goals Pt will go Supine/Side to Sit: with supervision PT Goal: Supine/Side to Sit - Progress: Revised due to lack of progress Pt will go Sit to Stand: with supervision PT Goal: Sit to Stand - Progress: Revised due to lack of progress Pt will go Stand to Sit: with supervision PT Goal: Stand to Sit - Progress: Revised due to lack of progress Pt will Ambulate: 51 - 150 feet;with supervision;with least restrictive assistive device (with sats >88%) PT Goal: Ambulate - Progress: Revised due to lack of progress  Visit Information  Last PT Received On: 10/21/12 Assistance Needed: +1    Subjective Data  Subjective: I'm doing better than I was   Cognition  Overall Cognitive Status: History of cognitive impairments - at baseline Arousal/Alertness: Awake/alert Orientation Level: Appears intact for tasks assessed Behavior During Session: [facility] for tasks performed    Balance     End of Session PT - End of Session Equipment Utilized During Treatment: Gait belt Activity Tolerance: Patient limited by fatigue Patient left: in chair;with call bell/phone within reach;with nursing in room Nurse Communication: Mobility status  GP     Toney Sang Beth 10/21/2012, 12:54 PM Delaney Meigs, PT [phone]

## 2012-10-21 NOTE — Progress Notes (Signed)
INITIAL ADULT NUTRITION ASSESSMENT Date: 10/21/2012   Time: 12:04 PM Reason for Assessment: poor po intake   INTERVENTION: 1. Continue Ensure Complete BID as ordered  2. RD will continue to follow    DOCUMENTATION CODES Per approved criteria  -Underweight     ASSESSMENT: Female 78 y.o.  Dx: Acute respiratory failure with hypoxia  Hx:  Past Medical History  Diagnosis Date  . Hypertension   . Peripheral arterial disease   . Cognitive disorder   . Esophageal stricture   . Osteoarthritis   . Anxiety   . UTI (lower urinary tract infection) June 2013  . COPD (chronic obstructive pulmonary disease)     Past Surgical History  Procedure Date  . Femoral bypass   . Esophagogastroduodenoscopy 06/21/2012    Procedure: ESOPHAGOGASTRODUODENOSCOPY (EGD);  Surgeon: Theda Belfast, MD;  Location: [facility];  Service: Endoscopy;  Laterality: N/A;    Related Meds:     . acetaminophen  1,000 mg Oral TID  . ALPRAZolam  0.25 mg Oral BID  . aspirin  81 mg Oral Daily  . cholecalciferol  1,000 Units Oral Daily  . clopidogrel  75 mg Oral Q breakfast  . donepezil  5 mg Oral QHS  . enoxaparin (LOVENOX) injection  30 mg Subcutaneous Q24H  . feeding supplement  237 mL Oral BID BM  . Fluticasone-Salmeterol  1 puff Inhalation Q12H  . guaiFENesin  600 mg Oral BID  . imipenem-cilastatin  250 mg Intravenous Q12H  . isosorbide mononitrate  30 mg Oral Daily  . metoCLOPramide (REGLAN) injection  5 mg Intravenous Q8H  . metoprolol tartrate  25 mg Oral BID  . mirtazapine  7.5 mg Oral QHS  . montelukast  10 mg Oral q morning - 10a  . pantoprazole (PROTONIX) IV  40 mg Intravenous Q12H  . simvastatin  10 mg Oral q1800  . sodium chloride  2 spray Nasal q morning - 10a  . sodium polystyrene  30 g Oral Once  . sucralfate  1 g Oral TID WC & HS  . tetrahydrozoline  1 drop Both Eyes QID  . vancomycin  500 mg Intravenous Q48H  . DISCONTD: famotidine  20 mg Oral Daily  . DISCONTD: pantoprazole  40 mg  Oral Q1200     Ht: 5' (152.4 cm)  Wt: 112 lb 6.4 oz (50.984 kg)  Ideal Wt: 45.4 kg  % Ideal Wt: 112%  Usual Wt:  Wt Readings from Last 5 Encounters:  10/21/12 112 lb 6.4 oz (50.984 kg)  06/14/12 91 lb 0.8 oz (41.3 kg)  06/14/12 91 lb 0.8 oz (41.3 kg)  92 lbs on admission   % Usual Wt: 101% of admission weight   Body mass index is 21.95 kg/(m^2). Pt is WNL per current BMI   Food/Nutrition Related Hx: Pt denies weight loss or poor appetite PTA  Labs:  CMP     Component Value Date/Time   NA 137 10/21/2012 0039   K 4.2 10/21/2012 0039   CL 105 10/21/2012 0039   CO2 23 10/21/2012 0039   GLUCOSE 95 10/21/2012 0039   BUN 34* 10/21/2012 0039   CREATININE 1.50* 10/21/2012 0039   CALCIUM 8.5 10/21/2012 0039   PROT 7.2 10/16/2012 0835   ALBUMIN 3.3* 10/16/2012 0835   AST 24 10/16/2012 0835   ALT 11 10/16/2012 0835   ALKPHOS 86 10/16/2012 0835   BILITOT 0.3 10/16/2012 0835   GFRNONAA 30* 10/21/2012 0039   GFRAA 35* 10/21/2012 0039    Intake/Output Summary (  Last 24 hours) at 10/21/12 1205 Last data filed at 10/21/12 0109  Gross per 24 hour  Intake 891.33 ml  Output    100 ml  Net 791.33 ml     Diet Order: Clear Liquid  Supplements/Tube Feeding: Ensure Complete po BID, each supplement provides 350 kcal and 13 grams of protein.  IVF:    DISCONTD: sodium chloride Last Rate: 60 mL/hr at 10/21/12 0448    Estimated Nutritional Needs:   Kcal: 1200-1400 Protein: 50-60 gm  Fluid: 1.4-1.6 L  Pt was admitted with acute respiratory distress on 10/24. Pt with poor po intake this admission.  On admission pt weight was 92 lbs, with BMI 18.1, meeting criteria for underweight. Pt weight on admission was stable with previous admission weights.  Pt wight has been trending up, pt is +5L fluids this admission. ? Some level of dehydration on admission.   Pt working with therapy at time of RD visit.  NUTRITION DIAGNOSIS: -Underweight (NI-3.1).  Status: Ongoing  RELATED TO:  advanced age  AS EVIDENCE BY: BMI less then 19 kg/(m^2) on admission   MONITORING/EVALUATION(Goals): Goal: PO intake to meet >90% estimated nutrition needs  Monitor: PO intake, weight, labs  EDUCATION NEEDS: -No education needs identified at this time   Clarene Duke RD, LDN Pager [phone] After Hours pager [phone]  10/21/2012, 12:04 PM

## 2012-10-21 NOTE — Progress Notes (Signed)
Patient Name: [name] Date of Encounter: 10/21/2012  Principal Problem:  *Acute hypoxic respiratory failure  Active Problems:  Healthcare-associated pneumonia  NSTEMI, initial episode of care  Peripheral arterial disease  Elevated troponin  Hemoptysis  Systolic and diastolic CHF, acute on chronic    SUBJECTIVE: Pt feels a little better today, coughing at times and getting some sputum up. No chest pain. Son in room and feels pt appropriate for medical therapy, should be kept comfortable.  She appears to be much better than yesterday.  OBJECTIVE Filed Vitals:   10/20/12 2142 10/20/12 2145 10/21/12 0440 10/21/12 0446  BP: 133/73 133/78 127/69   Pulse:  87 71   Temp:  98.3 F (36.8 C) 97.1 F (36.2 C)   TempSrc:  Oral Axillary   Resp:  18 18   Height:      Weight:    112 lb 6.4 oz (50.984 kg)  SpO2:  90% 94%     Intake/Output Summary (Last 24 hours) at 10/21/12 0947 Last data filed at 10/21/12 0109  Gross per 24 hour  Intake 891.33 ml  Output    100 ml  Net 791.33 ml   Filed Weights   10/19/12 0527 10/20/12 0414 10/21/12 0446  Weight: 104 lb 8 oz (47.401 kg) 107 lb 4.8 oz (48.671 kg) 112 lb 6.4 oz (50.984 kg)    PHYSICAL EXAM General: Well developed, well nourished, elderly female in no acute distress.  More alert today. Head: Normocephalic, atraumatic.  Neck: Supple without bruits, JVD at 10 cm. Lungs:  Resp regular and unlabored, rales bases - coarse, with some crackles. Heart: RRR, S1, S2, no S3, S4, or murmur. Abdomen: Soft, non-tender, non-distended, BS + x 4.  Extremities: No clubbing, cyanosis, no edema.  Neuro: Alert and oriented X 3. Moves all extremities spontaneously. Psych: Normal affect.  LABS: CBC: Basename 10/21/12 0039 10/20/12 0948  WBC 6.1 7.6  NEUTROABS -- --  HGB 9.6* 11.7*  HCT 29.9* 36.6  MCV 86.7 87.6  PLT 144* 164   INR:No results found for this basename: INR in the last 72 hours Basic Metabolic Panel: Basename 10/21/12  0039 10/20/12 0610  NA 137 133*  K 4.2 5.7*  CL 105 100  CO2 23 24  GLUCOSE 95 115*  BUN 34* 29*  CREATININE 1.50* 1.32*  CALCIUM 8.5 9.4  MG -- --  PHOS -- --   Cardiac Enzymes: Basename 10/21/12 0039 10/20/12 1815 10/20/12 1302  CKTOTAL -- -- --  CKMB -- -- --  CKMBINDEX -- -- --  TROPONINI 0.38* 0.37* <0.30   BNP: Pro B Natriuretic peptide (BNP)  Date/Time Value Range Status  10/16/2012  8:36 AM 9826.0* 0 - 450 pg/mL Final    TELE:   SR, rare ST   Radiology/Studies: Dg Chest 1 View 10/20/2012  *RADIOLOGY REPORT*  Clinical Data: Nausea and vomiting with abdominal pain.  Ileus, weakness and shortness of breath.  CHEST - 1 VIEW  Comparison: 10/17/2012 and CT chest 10/16/2012.  Findings: Heart is enlarged, stable.  Thoracic aorta is calcified. Lungs are emphysematous.  Continued improvement in left lower lobe pneumonia.  Small left pleural effusion.  IMPRESSION: Improving left lower lobe pneumonia superimposed on emphysema. Small left pleural effusion.   Original Report Authenticated By: Reyes Ivan, M.D.    Dg Abd Portable 2v 10/21/2012  *RADIOLOGY REPORT*  Clinical Data: Abdominal discomfort, vomiting  PORTABLE ABDOMEN - 2 VIEW  Comparison: Abdomen film of 10/20/2012, CT abdomen pelvis of 06/13/2012  Findings:  Portable supine and erect views of the abdomen show no bowel obstruction.  Air is noted to the rectum.  No free air is seen on the erect view.  Abnormal opacity is noted particularly at the left lung base. In review of the CT from 06/13/2012 the left lung base appeared clear, and therefore this opacity may represent pneumonia.  IMPRESSION: No bowel obstruction.  No free air.  Abnormal opacity at the left lung base, possibly pneumonia as noted above.   Original Report Authenticated By: Juline Patch, M.D.    Dg Swallowing Func-speech Pathology 10/17/2012  Vivi Ferns McCoy, CCC-SLP     10/17/2012 12:07 PM Objective Swallowing Evaluation: Modified Barium Swallowing Study    Patient Details  Name: Jahni Paul MRN: [id number] Date of Birth: 1922-09-17  Today's Date: 10/17/2012 Time: 4098-1191 SLP Time Calculation (min): 20 min  Past Medical History:  Past Medical History  Diagnosis Date  . Hypertension   . Peripheral arterial disease   . Cognitive disorder   . Esophageal stricture   . Osteoarthritis   . Anxiety   . UTI (lower urinary tract infection) June 2013  . COPD (chronic obstructive pulmonary disease)    Past Surgical History:  Past Surgical History  Procedure Date  . Femoral bypass   . Esophagogastroduodenoscopy 06/21/2012    Procedure: ESOPHAGOGASTRODUODENOSCOPY (EGD);  Surgeon: Theda Belfast, MD;  Location: [facility];  Service: Endoscopy;   Laterality: N/A;   HPI:  78 year old female who lives in an ALF. She has baseline  exertional dyspnea and has had cough (non-productive) for > 2  weeks which seems to be worse when she is recumbent. She also has  noticed the sensation of difficulty swallowing for the last 2  weeks (of note she has had prior esophageal dilations in past).  Was in usual stat of health until acutely the am of 10/24. States  had even been seen on 10/23 w/ clean bill of health by rounding  NP at ALF. She denies sick exposure, fevers, chills, chest pain,  nausea or vomiting. Has had not LE swelling. No wt gain or loss.  She presented to the ER in acute distress requiring NIPPV.   Presented to ER on 10/24 with acute on chronic dyspnea,  hemoptysis: initially bright red, ~1/4 teaspoon in volume  estimated that she had a total of 6-8 episodes of this before it  started to subside: no more pink tinged. CXR indicates left  greater than right airspace disease.      Assessment / Plan / Recommendation Clinical Impression  Dysphagia Diagnosis: Suspected primary esophageal dysphagia;Mild  oral phase dysphagia;Mild pharyngeal phase dysphagia Clinical impression: Patient continues to present with a  suspected primary esophageal dysphagia with a mild, secondary,   oropharyngeal component. Overall function characterized by  intermittently delayed swallow initiation with larger thin liquid  bolus sizes, when combined with mildly decreased base of tongue  strength, decreased hyo-laryngeal elevation, excursion, and UES  relaxation, result in trace penetrates which are spontaneously  cleared. Otherwise, full airway protection noted. Spontaneous dry  swallow additonally successful to clear trace-minimal pharyngeal  residuals post swallow. Although combination of esophageal and  pharyngeal components does increase aspiration risk, patient  judged safe to resume a po diet with aspiration precautions at  this time. SLP will f/u closely at bedside for continued  education and diet tolerance.     Treatment Recommendation  Therapy as outlined in treatment plan below    Diet Recommendation Dysphagia 3 (Mechanical  Soft);Thin liquid   Liquid Administration via: Cup;Straw Medication Administration: Whole meds with liquid Supervision: Patient able to self feed;Intermittent supervision  to cue for compensatory strategies Compensations: Slow rate;Small sips/bites Postural Changes and/or Swallow Maneuvers: Seated upright 90  degrees;Upright 30-60 min after meal    Other  Recommendations Oral Care Recommendations: Oral care BID   Follow Up Recommendations  None    Frequency and Duration min 2x/week  2 weeks       SLP Swallow Goals Patient will utilize recommended strategies during swallow to  increase swallowing safety with: Independent assistance Swallow Study Goal #2 - Progress: Not met   General HPI: 78 year old female who lives in an ALF. She has  baseline exertional dyspnea and has had cough (non-productive)  for > 2 weeks which seems to be worse when she is recumbent. She  also has noticed the sensation of difficulty swallowing for the  last 2 weeks (of note she has had prior esophageal dilations in  past). Was in usual stat of health until acutely the am of 10/24.  States had even been  seen on 10/23 w/ clean bill of health by  rounding NP at ALF. She denies sick exposure, fevers, chills,  chest pain, nausea or vomiting. Has had not LE swelling. No wt  gain or loss. She presented to the ER in acute distress requiring  NIPPV.  Presented to ER on 10/24 with acute on chronic dyspnea,  hemoptysis: initially bright red, ~1/4 teaspoon in volume  estimated that she had a total of 6-8 episodes of this before it  started to subside: no more pink tinged. CXR indicates left  greater than right airspace disease.  Type of Study: Modified Barium Swallowing Study Reason for Referral: Objectively evaluate swallowing function Previous Swallow Assessment: Bedside swallow evaluation complete  during previous [facility] admission on 06/16/12 and indicated a suspected  primary esophageal dysphagia. Barium swallow complete during that  same admission indicated Marked esophageal dysmotility with  possible diffuse esophageal Diet Prior to this Study: NPO Temperature Spikes Noted: Yes Respiratory Status: Supplemental O2 delivered via (comment) (2 L  nasal cannula) History of Recent Intubation: No Behavior/Cognition: Alert;Cooperative;Pleasant mood Oral Cavity - Dentition: Adequate natural dentition Oral Motor / Sensory Function: Impaired motor Oral impairment: Right lingual (tongue deviates to the right, no  other focal deficits) Self-Feeding Abilities: Able to feed self Patient Positioning: Upright in bed Baseline Vocal Quality: Clear Volitional Cough: Strong Volitional Swallow: Able to elicit Anatomy: Within functional limits Pharyngeal Secretions: Not observed secondary MBS    Reason for Referral Objectively evaluate swallowing function   Oral Phase Oral Preparation/Oral Phase Oral Phase: WFL   Pharyngeal Phase Pharyngeal Phase Pharyngeal Phase: Impaired Pharyngeal - Thin Pharyngeal - Thin Cup: Delayed swallow initiation;Premature  spillage to valleculae;Pharyngeal residue - valleculae;Pharyngeal  residue - pyriform  sinuses;Reduced tongue base retraction Pharyngeal - Thin Straw: Delayed swallow initiation;Premature  spillage to pyriform sinuses;Reduced tongue base  retraction;Penetration/Aspiration before swallow;Pharyngeal  residue - pyriform sinuses;Pharyngeal residue - valleculae Penetration/Aspiration details (thin straw): Material enters  airway, CONTACTS cords then ejected out (spontaneous throat clear  to clear trace penetrates) Pharyngeal - Solids Pharyngeal - Puree: Reduced tongue base retraction;Pharyngeal  residue - valleculae Pharyngeal - Mechanical Soft: Reduced tongue base  retraction;Pharyngeal residue - valleculae Pharyngeal - Pill: Within functional limits (provided whole with  thin liquid)  Cervical Esophageal Phase    GO   Ferdinand Lango MA, CCC-SLP [phone]  Cervical Esophageal Phase Cervical Esophageal Phase: Impaired Cervical Esophageal Phase -  Thin Thin Cup: Reduced cricopharyngeal relaxation Thin Straw: Reduced cricopharyngeal relaxation Cervical Esophageal Phase - Solids Puree: Reduced cricopharyngeal relaxation Mechanical Soft: Reduced cricopharyngeal relaxation Pill: Reduced cricopharyngeal relaxation (mild)         McCoy Leah [name] 10/17/2012, 12:06 PM      Current Medications:     . acetaminophen  1,000 mg Oral TID  . aspirin  81 mg Oral Daily  . cholecalciferol  1,000 Units Oral Daily  . clopidogrel  75 mg Oral Q breakfast  . donepezil  5 mg Oral QHS  . enoxaparin (LOVENOX) injection  30 mg Subcutaneous Q24H  . feeding supplement  237 mL Oral BID BM  . Fluticasone-Salmeterol  1 puff Inhalation Q12H  . guaiFENesin  600 mg Oral BID  . imipenem-cilastatin  250 mg Intravenous Q12H  . isosorbide mononitrate  30 mg Oral Daily  . metoCLOPramide (REGLAN) injection  5 mg Intravenous Q8H  . metoprolol tartrate  25 mg Oral BID  . mirtazapine  7.5 mg Oral QHS  . montelukast  10 mg Oral q morning - 10a  . pantoprazole (PROTONIX) IV  40 mg Intravenous Q12H  . simvastatin  10 mg Oral q1800    . sodium chloride  2 spray Nasal q morning - 10a  . sodium polystyrene  30 g Oral Once  . sucralfate  1 g Oral TID WC & HS  . tetrahydrozoline  1 drop Both Eyes QID  . vancomycin  500 mg Intravenous Q48H    ASSESSMENT AND PLAN: NSTEMI, initial episode of care (10/17/2012) Pt has + cardiac enzymes. Given her frailty, I would agree with medical therapy. I think she is at increased risk for invasive / interventional procedures. Continue Plavix, ASA, metoprolol; Imdur 30 QD added 10/28, tolerating so far. On statin also.   I suspect that her symptoms are due to coronary ischemia. She is a very poor candidate for invasive procedures. I suspect she has multivessel disease and would require CABG. I think her chances of making it through CABG would be low given her age and comorbid conditions.   Systolic and diastolic CHF, acute on chronic (10/17/2012) Echo shows EF of 40-45%. She has anterio-septal wall motion abnormality. Continue meds  -  Not currently on diuretic but CXR yesterday with PNA, not significant edema. However, I/O are net + > 5 liters since admit and BUN/Cr are trending up (without diuretics). May need to put on diuretics, continue to follow RF/volume closely.  Otherwise, per primary MD Acute hypoxic respiratory failure (10/16/2012) Healthcare-associated pneumonia (10/16/2012) Hemoptysis (10/16/2012) PAD  Signed, Theodore Demark , PA-C 10:42 AM 10/21/2012  Attending Note:  The patient was seen and examined.  Agree with assessment and plan as noted above.  i have made changes as needed in the H&P.  She is considerably better today.  At this point, I think she could be managed at a SNF and not necessarily go to the Hospice / Palliative care service.  She has remained hemodynamically stable and the episode of CP / nausea / vomiting that she was having yesterday turned out to be not as serious as we initially thought.  There was some initial concern that this was a significant  NSTEMI.  She is feeling quite a bit better.  The daughter has some concerns about her going to Hospice and Palliative care and I agree that the patient does not seem to be in any acute distress at this time.   Vesta Mixer, Montez Hageman., MD, [facility] 10/21/2012,  1:05 PM

## 2012-10-21 NOTE — Progress Notes (Signed)
PT Cancellation Note  Patient Details Name: [name] MRN: [id number] DOB: 09-Mar-1922   Cancelled Treatment:    Reason Eval/Treat Not Completed: Medical issues which prohibited therapy (pt with continued elevating troponin and need to have declining values before able to proceed with therapy ). Will check back next date. Delaney Meigs, PT [phone]

## 2012-10-22 LAB — CBC
MCV: 88.2 fL (ref 78.0–100.0)
Platelets: 141 10*3/uL — ABNORMAL LOW (ref 150–400)
RBC: 3.8 MIL/uL — ABNORMAL LOW (ref 3.87–5.11)
WBC: 4.8 10*3/uL (ref 4.0–10.5)

## 2012-10-22 LAB — CULTURE, BLOOD (ROUTINE X 2): Culture: NO GROWTH

## 2012-10-22 LAB — BASIC METABOLIC PANEL
CO2: 23 mEq/L (ref 19–32)
Chloride: 103 mEq/L (ref 96–112)
Potassium: 3.9 mEq/L (ref 3.5–5.1)
Sodium: 135 mEq/L (ref 135–145)

## 2012-10-22 MED ORDER — PANTOPRAZOLE SODIUM 40 MG PO TBEC
40.0000 mg | DELAYED_RELEASE_TABLET | Freq: Two times a day (BID) | ORAL | Status: DC
  Administered 2012-10-22 – 2012-10-24 (×4): 40 mg via ORAL
  Filled 2012-10-22 (×4): qty 1

## 2012-10-22 MED ORDER — FUROSEMIDE 10 MG/ML IJ SOLN
20.0000 mg | Freq: Once | INTRAMUSCULAR | Status: AC
  Administered 2012-10-22: 20 mg via INTRAVENOUS
  Filled 2012-10-22: qty 2

## 2012-10-22 MED ORDER — LEVOFLOXACIN 500 MG PO TABS
500.0000 mg | ORAL_TABLET | ORAL | Status: DC
  Administered 2012-10-22: 500 mg via ORAL
  Filled 2012-10-22 (×2): qty 1

## 2012-10-22 MED ORDER — SUCRALFATE 1 GM/10ML PO SUSP
1.0000 g | Freq: Three times a day (TID) | ORAL | Status: DC | PRN
  Filled 2012-10-22: qty 10

## 2012-10-22 NOTE — Progress Notes (Signed)
ANTIBIOTIC CONSULT NOTE - FOLLOW UP  Pharmacy Consult for vancomycin, Primaxin Indication: pneumonia  Allergies  Allergen Reactions  . Altace (Ramipril) Other (See Comments)    Reaction unknown  . Ambien (Zolpidem Tartrate) Other (See Comments)    Reaction unknown  . Codeine Other (See Comments)    Reaction unknown  . Demerol (Meperidine) Other (See Comments)    Reaction unknown  . Penicillins Other (See Comments)    Reaction unknown  . Sulfa Antibiotics Other (See Comments)    Reaction unknown  . Versed (Midazolam) Other (See Comments)    Reaction unknown    Patient Measurements: Height: 5' (152.4 cm) Weight: 105 lb 9.6 oz (47.9 kg) IBW/kg (Calculated) : 45.5    Vital Signs: Temp: 97.6 F (36.4 C) (10/30 0557) Temp src: Oral (10/30 0557) BP: 147/70 mmHg (10/30 0557) Pulse Rate: 75  (10/30 0557) Intake/Output from previous day:   Intake/Output from this shift:    Labs:  Basename 10/22/12 0645 10/21/12 0039 10/20/12 0948 10/20/12 0610  WBC 4.8 6.1 7.6 --  HGB 10.5* 9.6* 11.7* --  PLT 141* 144* 164 --  LABCREA -- -- -- --  CREATININE 1.40* 1.50* -- 1.32*   Estimated Creatinine Clearance: 20 ml/min (by C-G formula based on Cr of 1.4). No results found for this basename: VANCOTROUGH:2,VANCOPEAK:2,VANCORANDOM:2,GENTTROUGH:2,GENTPEAK:2,GENTRANDOM:2,TOBRATROUGH:2,TOBRAPEAK:2,TOBRARND:2,AMIKACINPEAK:2,AMIKACINTROU:2,AMIKACIN:2, in the last 72 hours    Assessment: 78 yo F admitted 10/16/2012 with worsening DOE, chills and concern for PNA. Pharmacy consulted to dose vancomycin and Primaxin. Noted plans for po Levaquin today. SCr=1.4 and CrCl~ 20.  Vanc 10/24 >> Primaxin 10/24 >>  10/24 Blood >> NG 10/24 Sputum >> ng - recollect suggested 10/24 Urine >> NG 10/24 MRSA PCR: negative   Goal of Therapy:  Vancomycin trough level 15-20 mcg/ml  Plan:  1. Vancomycin 500mg  IV q48h.  2. Primaxin 250 mg Q 12 hours 3. Consider levaquin 500mg  po q48hr  Thank you for  the consult.  Harland German, Pharm D 10/22/2012 8:22 AM

## 2012-10-22 NOTE — Progress Notes (Signed)
Speech Language Pathology Dysphagia Treatment Patient Details Name: [name] MRN: [id number] DOB: [birth year] Today's Date: 10/22/2012 Time: 0840-0900 SLP Time Calculation (min): 20 min  Assessment / Plan / Recommendation Clinical Impression  Treatment focused on f/u diet tolerance assessment and patient education. Patient alert and pleasant, able to self feed am meal without overt s/s of aspiration and with independent use of safe swallowing precautions once physically assisted with repositioning. Overall, current diet continues to be appropriate (changed to full liquids given medical events of past 48 hours but may resume dysphagia 3 when appropriate). No further skilled SLP needs indicated at this time.     Diet Recommendation  Continue with Current Diet: Dysphagia 3 (mechanical soft);Thin liquid    SLP Plan All goals met   Pertinent Vitals/Pain n/a   Swallowing Goals  SLP Swallowing Goals Patient will utilize recommended strategies during swallow to increase swallowing safety with: Independent assistance Swallow Study Goal #2 - Progress: Met  General Temperature Spikes Noted: No Respiratory Status: Supplemental O2 delivered via (comment) (3 L nasal cannula) Behavior/Cognition: Alert;Cooperative;Pleasant mood Oral Cavity - Dentition: Adequate natural dentition Patient Positioning: Upright in bed   Dysphagia Treatment Treatment focused on: Skilled observation of diet tolerance;Patient/family/caregiver education Treatment Methods/Modalities: Skilled observation Patient observed directly with PO's: Yes Type of PO's observed: Dysphagia 1 (puree);Thin liquids Feeding: Able to feed self Liquids provided via: Cup   [name], CCC-SLP [phone]   [name] 10/22/2012, 10:07 AM

## 2012-10-22 NOTE — Progress Notes (Signed)
Patient Name: [name] Date of Encounter: 10/22/2012     Principal Problem:  *Acute hypoxic respiratory failure  Active Problems:  Peripheral arterial disease  Healthcare-associated pneumonia  Elevated troponin  Hemoptysis  NSTEMI, initial episode of care  Systolic and diastolic CHF, acute on chronic    SUBJECTIVE: Improved SOB. Still fairly dyspneic. No chest pain, lightheadedness or palpitations.    OBJECTIVE  Filed Vitals:   10/22/12 0557 10/22/12 0900 10/22/12 0932 10/22/12 1045  BP: 147/70 132/60  143/75  Pulse: 75 76  84  Temp: 97.6 F (36.4 C) 97.2 F (36.2 C)    TempSrc: Oral Oral    Resp: 20 34    Height:      Weight: 47.9 kg (105 lb 9.6 oz)     SpO2: 99% 97% 96%    No intake or output data in the 24 hours ending 10/22/12 1210 Weight change: -3.084 kg (-6 lb 12.8 oz)  PHYSICAL EXAM  General: Elderly, thin, well developed, in no acute distress. Head: Normocephalic, atraumatic, sclera non-icteric, no xanthomas, nares are without discharge.  Neck: Supple without bruits or JVP 7-8 cm Lungs:  Tachypneic on 2.5 L n/c, normal resp effort, diffuse rhonchi and bibasilar rales appreciated. No wheezing.  Heart: Tachycardic, regular, no s3, s4, or murmurs. Abdomen: Soft, non-tender, non-distended, BS + x 4.  Msk:  Strength and tone appears normal for age. Extremities: o clubbing, cyanosis or edema. DP/PT/Radials 2+ and equal bilaterally. Neuro: Alert and oriented X 3. Moves all extremities spontaneously. Psych: Normal affect.  LABS:  Recent Labs  [facility] 10/22/12 0645 10/21/12 0039   WBC 4.8 6.1   HGB 10.5* 9.6*   HCT 33.5* 29.9*   MCV 88.2 86.7   PLT 141* 144*   Lab 10/22/12 0645 10/21/12 0039 10/20/12 0610 10/16/12 0835  NA 135 137 133* --  K 3.9 4.2 5.7* --  CL 103 105 100 --  CO2 23 23 24  --  BUN 29* 34* 29* --  CREATININE 1.40* 1.50* 1.32* --  CALCIUM 9.0 8.5 9.4 --  PROT -- -- -- 7.2  BILITOT -- -- -- 0.3  ALKPHOS -- -- -- 86  ALT --  -- -- 11  AST -- -- -- 24  AMYLASE -- -- -- --  LIPASE -- -- -- --  GLUCOSE 86 95 115* --   Recent Labs  Basename 10/21/12 0039 10/20/12 1815 10/20/12 1302   CKTOTAL -- -- --   CKMB -- -- --   CKMBINDEX -- -- --   TROPONINI 0.38* 0.37* <0.30    TELE: NSR, 60-70 bpm  Radiology/Studies:  Dg Chest 1 View  10/20/2012  *RADIOLOGY REPORT*  Clinical Data: Nausea and vomiting with abdominal pain.  Ileus, weakness and shortness of breath.  CHEST - 1 VIEW  Comparison: 10/17/2012 and CT chest 10/16/2012.  Findings: Heart is enlarged, stable.  Thoracic aorta is calcified. Lungs are emphysematous.  Continued improvement in left lower lobe pneumonia.  Small left pleural effusion.  IMPRESSION: Improving left lower lobe pneumonia superimposed on emphysema. Small left pleural effusion.   Original Report Authenticated By: Reyes Ivan, M.D.    Dg Shoulder Right  10/19/2012  *RADIOLOGY REPORT*  Clinical Data: Shoulder pain for 2 months, severe over the last 3 days.  RIGHT SHOULDER - 2+ VIEW  Comparison: Chest radiographs 10/17/2012.  Chest CT 10/16/2012.  Findings: The mineralization and alignment are normal.  There is no evidence of acute fracture or dislocation.  The subacromial space is preserved.  There are mild acromioclavicular degenerative changes with a small subacromial spur.  Patchy right lung opacities appear grossly unchanged.  IMPRESSION: No acute osseous findings demonstrated.  Mild degenerative changes.   Original Report Authenticated By: Gerrianne Scale, M.D.    Dg Abd 1 View  10/20/2012  *RADIOLOGY REPORT*  Clinical Data: Abdominal pain, weakness  ABDOMEN - 1 VIEW  Comparison: 06/13/2012  Findings: Contrast material is noted within the colon.  Multiple diverticula are noted within the sigmoid colon.  No abnormal mass or abnormal calcifications are noted.  Mild scoliosis concave to the right is seen.  Postsurgical changes in the inguinal region are noted bilaterally.  Changes are noted  in the left lung base similar to that seen on recent chest x-ray.  IMPRESSION: No acute intra-abdominal abnormality is noted.  Diverticular disease is seen.   Original Report Authenticated By: Phillips Odor, M.D.    Ct Angio Chest Pe W/cm &/or Wo Cm  10/16/2012  *RADIOLOGY REPORT*  Clinical Data: 78 year old female with peripheral artery disease. Hypertension.  Acute respiratory distress and hemoptysis.  CT ANGIOGRAPHY CHEST  Technique:  Multidetector CT imaging of the chest using the standard protocol during bolus administration of intravenous contrast. Multiplanar reconstructed images including MIPs were obtained and reviewed to evaluate the vascular anatomy.  Contrast: 80mL OMNIPAQUE IOHEXOL 350 MG/ML SOLN  Comparison: Portable chest radiograph the same day and earlier.  Findings: Good contrast bolus timing in the pulmonary arterial tree.  Mild respiratory motion artifact. No focal filling defect identified in the pulmonary arterial tree to suggest the presence of acute pulmonary embolism.  Extensive left greater than right lower lobe pulmonary consolidation.  Within the consolidated lung, typically on the left enhancing pulmonary vasculature is noted.  No definite areas of contrast extravasation within the lungs are identified.  Scattered areas of gas within the consolidated lung on the left, some suspicious for early cavitary change, but no definite areas of pulmonary necrosis.  The right lower lobe is significantly less affected.  Underlying centrilobular emphysema.  Major airways are patent.  Cardiomegaly.  No pericardial effusion.  Only trace pleural effusions.  Negative visualized upper abdominal viscera.  No mediastinal lymphadenopathy.  Intermittent gaseous distention of the thoracic esophagus.  Negative visualized thoracic inlet.  No acute osseous abnormality identified.  IMPRESSION: 1. No evidence of acute pulmonary embolus.  No definite pulmonary vascular contrast extravasation identified in the  abnormal left lung, see #2.  2.  Extensive left lower lobe consolidation.  Severe pneumonia or aspiration could have this appearance.  Necrotizing pneumonia cannot be excluded.  3.  Comparatively mild right lower lobe consolidation.  Trace bilateral pleural effusions.  4.  Underlying emphysema.  Cardiomegaly.   Original Report Authenticated By: Harley Hallmark, M.D.    Dg Chest Port 1 View  10/17/2012  *RADIOLOGY REPORT*  Clinical Data: Pneumonia  PORTABLE CHEST - 1 VIEW  Comparison: 10/16/2012  Findings: Extensive airspace disease within the left mid and lower lung zones has improved.  Cardiomegaly persist.  No pneumothorax. Stable appearance of the right lung with scattered scarring verse atelectasis.  IMPRESSION: Improving left lower lobe pneumonia.   Original Report Authenticated By: Donavan Burnet, M.D.    Dg Chest Portable 1 View  10/16/2012  *RADIOLOGY REPORT*  Clinical Data: Shortness of breath, respiratory distress  PORTABLE CHEST - 1 VIEW  Comparison: Portable exam 0857 hours compared to 06/13/2012  Findings: Minimal enlargement of cardiac silhouette. Mediastinal contours normal. Extensive airspace infiltrate identified in lingula and  left lower lobe consistent with pneumonia. Aspiration not entirely excluded. Underlying emphysematous changes. Probable mild infiltrate at right base as well. No gross pleural effusion or pneumothorax. Diffuse osseous demineralization.  IMPRESSION: Extensive airspace consolidation involving the lingula and left lower lobe consistent with pneumonia though aspiration not excluded. Minimal similar right basilar infiltrate.   Original Report Authenticated By: Lollie Marrow, M.D.    Dg Abd Portable 2v  10/21/2012  *RADIOLOGY REPORT*  Clinical Data: Abdominal discomfort, vomiting  PORTABLE ABDOMEN - 2 VIEW  Comparison: Abdomen film of 10/20/2012, CT abdomen pelvis of 06/13/2012  Findings: Portable supine and erect views of the abdomen show no bowel obstruction.  Air is  noted to the rectum.  No free air is seen on the erect view.  Abnormal opacity is noted particularly at the left lung base. In review of the CT from 06/13/2012 the left lung base appeared clear, and therefore this opacity may represent pneumonia.  IMPRESSION: No bowel obstruction.  No free air.  Abnormal opacity at the left lung base, possibly pneumonia as noted above.   Original Report Authenticated By: Juline Patch, M.D.    Dg Swallowing Func-speech Pathology  10/17/2012  Vivi Ferns McCoy, CCC-SLP     10/17/2012 12:07 PM Objective Swallowing Evaluation: Modified Barium Swallowing Study   Patient Details  Name: Starlena Beil MRN: [id number] Date of Birth: 03/04/1922  Today's Date: 10/17/2012 Time: 4098-1191 SLP Time Calculation (min): 20 min  Past Medical History:  Past Medical History  Diagnosis Date  . Hypertension   . Peripheral arterial disease   . Cognitive disorder   . Esophageal stricture   . Osteoarthritis   . Anxiety   . UTI (lower urinary tract infection) June 2013  . COPD (chronic obstructive pulmonary disease)    Past Surgical History:  Past Surgical History  Procedure Date  . Femoral bypass   . Esophagogastroduodenoscopy 06/21/2012    Procedure: ESOPHAGOGASTRODUODENOSCOPY (EGD);  Surgeon: Theda Belfast, MD;  Location: [facility];  Service: Endoscopy;   Laterality: N/A;   HPI:  78 year old female who lives in an ALF. She has baseline  exertional dyspnea and has had cough (non-productive) for > 2  weeks which seems to be worse when she is recumbent. She also has  noticed the sensation of difficulty swallowing for the last 2  weeks (of note she has had prior esophageal dilations in past).  Was in usual stat of health until acutely the am of 10/24. States  had even been seen on 10/23 w/ clean bill of health by rounding  NP at ALF. She denies sick exposure, fevers, chills, chest pain,  nausea or vomiting. Has had not LE swelling. No wt gain or loss.  She presented to the ER in acute distress requiring  NIPPV.   Presented to ER on 10/24 with acute on chronic dyspnea,  hemoptysis: initially bright red, ~1/4 teaspoon in volume  estimated that she had a total of 6-8 episodes of this before it  started to subside: no more pink tinged. CXR indicates left  greater than right airspace disease.      Assessment / Plan / Recommendation Clinical Impression  Dysphagia Diagnosis: Suspected primary esophageal dysphagia;Mild  oral phase dysphagia;Mild pharyngeal phase dysphagia Clinical impression: Patient continues to present with a  suspected primary esophageal dysphagia with a mild, secondary,  oropharyngeal component. Overall function characterized by  intermittently delayed swallow initiation with larger thin liquid  bolus sizes, when combined with mildly decreased base of  tongue  strength, decreased hyo-laryngeal elevation, excursion, and UES  relaxation, result in trace penetrates which are spontaneously  cleared. Otherwise, full airway protection noted. Spontaneous dry  swallow additonally successful to clear trace-minimal pharyngeal  residuals post swallow. Although combination of esophageal and  pharyngeal components does increase aspiration risk, patient  judged safe to resume a po diet with aspiration precautions at  this time. SLP will f/u closely at bedside for continued  education and diet tolerance.     Treatment Recommendation  Therapy as outlined in treatment plan below    Diet Recommendation Dysphagia 3 (Mechanical Soft);Thin liquid   Liquid Administration via: Cup;Straw Medication Administration: Whole meds with liquid Supervision: Patient able to self feed;Intermittent supervision  to cue for compensatory strategies Compensations: Slow rate;Small sips/bites Postural Changes and/or Swallow Maneuvers: Seated upright 90  degrees;Upright 30-60 min after meal    Other  Recommendations Oral Care Recommendations: Oral care BID   Follow Up Recommendations  None    Frequency and Duration min 2x/week  2 weeks       SLP  Swallow Goals Patient will utilize recommended strategies during swallow to  increase swallowing safety with: Independent assistance Swallow Study Goal #2 - Progress: Not met   General HPI: 78 year old female who lives in an ALF. She has  baseline exertional dyspnea and has had cough (non-productive)  for > 2 weeks which seems to be worse when she is recumbent. She  also has noticed the sensation of difficulty swallowing for the  last 2 weeks (of note she has had prior esophageal dilations in  past). Was in usual stat of health until acutely the am of 10/24.  States had even been seen on 10/23 w/ clean bill of health by  rounding NP at ALF. She denies sick exposure, fevers, chills,  chest pain, nausea or vomiting. Has had not LE swelling. No wt  gain or loss. She presented to the ER in acute distress requiring  NIPPV.  Presented to ER on 10/24 with acute on chronic dyspnea,  hemoptysis: initially bright red, ~1/4 teaspoon in volume  estimated that she had a total of 6-8 episodes of this before it  started to subside: no more pink tinged. CXR indicates left  greater than right airspace disease.  Type of Study: Modified Barium Swallowing Study Reason for Referral: Objectively evaluate swallowing function Previous Swallow Assessment: Bedside swallow evaluation complete  during previous [facility] admission on 06/16/12 and indicated a suspected  primary esophageal dysphagia. Barium swallow complete during that  same admission indicated Marked esophageal dysmotility with  possible diffuse esophageal Diet Prior to this Study: NPO Temperature Spikes Noted: Yes Respiratory Status: Supplemental O2 delivered via (comment) (2 L  nasal cannula) History of Recent Intubation: No Behavior/Cognition: Alert;Cooperative;Pleasant mood Oral Cavity - Dentition: Adequate natural dentition Oral Motor / Sensory Function: Impaired motor Oral impairment: Right lingual (tongue deviates to the right, no  other focal deficits) Self-Feeding Abilities:  Able to feed self Patient Positioning: Upright in bed Baseline Vocal Quality: Clear Volitional Cough: Strong Volitional Swallow: Able to elicit Anatomy: Within functional limits Pharyngeal Secretions: Not observed secondary MBS    Reason for Referral Objectively evaluate swallowing function   Oral Phase Oral Preparation/Oral Phase Oral Phase: WFL   Pharyngeal Phase Pharyngeal Phase Pharyngeal Phase: Impaired Pharyngeal - Thin Pharyngeal - Thin Cup: Delayed swallow initiation;Premature  spillage to valleculae;Pharyngeal residue - valleculae;Pharyngeal  residue - pyriform sinuses;Reduced tongue base retraction Pharyngeal - Thin Straw: Delayed swallow initiation;Premature  spillage  to pyriform sinuses;Reduced tongue base  retraction;Penetration/Aspiration before swallow;Pharyngeal  residue - pyriform sinuses;Pharyngeal residue - valleculae Penetration/Aspiration details (thin straw): Material enters  airway, CONTACTS cords then ejected out (spontaneous throat clear  to clear trace penetrates) Pharyngeal - Solids Pharyngeal - Puree: Reduced tongue base retraction;Pharyngeal  residue - valleculae Pharyngeal - Mechanical Soft: Reduced tongue base  retraction;Pharyngeal residue - valleculae Pharyngeal - Pill: Within functional limits (provided whole with  thin liquid)  Cervical Esophageal Phase    GO   Ferdinand Lango MA, CCC-SLP [phone]  Cervical Esophageal Phase Cervical Esophageal Phase: Impaired Cervical Esophageal Phase - Thin Thin Cup: Reduced cricopharyngeal relaxation Thin Straw: Reduced cricopharyngeal relaxation Cervical Esophageal Phase - Solids Puree: Reduced cricopharyngeal relaxation Mechanical Soft: Reduced cricopharyngeal relaxation Pill: Reduced cricopharyngeal relaxation (mild)         McCoy Leah [name] 10/17/2012, 12:06 PM      Current Medications:     . acetaminophen  1,000 mg Oral TID  . ALPRAZolam  0.25 mg Oral BID  . aspirin  81 mg Oral Daily  . cholecalciferol  1,000 Units Oral Daily  .  clopidogrel  75 mg Oral Q breakfast  . donepezil  5 mg Oral QHS  . feeding supplement  237 mL Oral BID BM  . Fluticasone-Salmeterol  1 puff Inhalation Q12H  . guaiFENesin  600 mg Oral BID  . imipenem-cilastatin  250 mg Intravenous Q12H  . isosorbide mononitrate  30 mg Oral Daily  . metoCLOPramide (REGLAN) injection  5 mg Intravenous Q8H  . metoprolol tartrate  25 mg Oral BID  . mirtazapine  7.5 mg Oral QHS  . montelukast  10 mg Oral q morning - 10a  . pantoprazole (PROTONIX) IV  40 mg Intravenous Q12H  . simvastatin  10 mg Oral q1800  . sodium chloride  2 spray Nasal q morning - 10a  . sucralfate  1 g Oral TID WC & HS  . tetrahydrozoline  1 drop Both Eyes QID  . vancomycin  500 mg Intravenous Q48H  . DISCONTD: enoxaparin (LOVENOX) injection  30 mg Subcutaneous Q24H    ASSESSMENT:  1. Acute hypoxic respiratory failure 2. Necrotizing HCAP 3. NSTEMI, type 2 in setting of septic shock from necrotizing PNA earlier in admission 4. Acute on chronic combined CHF 5. Hemoptysis  6. PAD  DISCUSSION/PLAN:  Doing well symptomatically. Still short of breath. No chest pain. Does not endorse ischemic symptoms. On exam, JVP is up, underlying rhonchi and rales on lung exam may correspond to CHF and underlying HCAP. No significant edema peripherally. I/Os continue to trend up. Echo on 10/24 showed LVEF 40-45%, grade 1 dd, moderate LVH, multiple WMAs, mild LA dilatation and moderate pHTN. She does have new TWIs anterolaterally on 10/28 EKG. Will discuss with MD utility of repeat echo. Would not aggressively diurese, but consider trial of Lasix IV x 1 with UOP monitoring. NSTEMI, type 2 secondary to HCAP. No plans for invasive diagnostics/intervention. Continue ASA/Plavix (dual benefit with PAD and likely underlying CAD), BB, statin, Imdur. Cr trending down (<1.5) and has room for further BP control. Consider adding ACEi. Hemoptysis has stopped. H/H stable. Has apparently improved clinically and plan is  now for SNF. Further management per primary team.     Signed, R. Hurman Horn, PA-C 10/22/2012, 12:10 PM

## 2012-10-22 NOTE — Progress Notes (Signed)
    I have seen and examined the patient. I agree with the above note with the addition of : mildly elevated CEs in setting of necrotizing pneumonia, likely type 2 NSTEMI . EF mildly reduced by echo.  She does appear mildly fluid overloaded. Agree with IV Lasix prn.  Due to age and comorbidities, no plans for ischemic cardiac evaluation.   Lorine Bears MD, [facility] 10/22/2012 6:10 PM

## 2012-10-22 NOTE — Clinical Social Work Placement (Addendum)
     Clinical Social Work Department CLINICAL SOCIAL WORK PLACEMENT NOTE 10/24/2012  Patient:  [name], [name]  Account Number:  [id number] Admit date:  10/16/2012  Clinical Social Worker:  Robin Searing  Date/time:  10/22/2012 10:25 AM  Clinical Social Work is seeking post-discharge placement for this patient at the following level of care:   SKILLED NURSING   (*CSW will update this form in Epic as items are completed)   10/22/2012  Patient/family provided with [facility] Department of Clinical Social Works list of facilities offering this level of care within the geographic area requested by the patient (or if unable, by the patients family).  10/22/2012  Patient/family informed of their freedom to choose among providers that offer the needed level of care, that participate in Medicare, Medicaid or managed care program needed by the patient, have an available bed and are willing to accept the patient.  10/22/2012  Patient/family informed of [facility] ownership interest in [facility], as well as of the fact that they are under no obligation to receive care at this facility.  PASARR submitted to EDS on 10/22/2012 PASARR number received from EDS on 10/24/2012  FL2 transmitted to all facilities in geographic area requested by pt/family on  10/22/2012 FL2 transmitted to all facilities within larger geographic area on   Patient informed that his/her managed care company has contracts with or will negotiate with  certain facilities, including the following:     Patient/family informed of bed offers received:  10/23/2012 Patient chooses bed at [facility] Physician recommends and patient chooses bed at    Patient to be transferred to [facility] on  10/24/2012 Patient to be transferred to facility by EMS  The following physician request were entered in Epic:   Additional Comments:

## 2012-10-22 NOTE — Progress Notes (Signed)
Addendum  Patient seen and examined, chart and data base reviewed.  I agree with the above assessment and plan.  For full details please see Mrs. Algis Downs PA. Note.  Acute hypoxic respiratory failure secondary to necrotizing pneumonia.  Continue antibiotics, continue PT/OT she is likely going to need SNF.   Clint Lipps, MD [facility] Pager: [phone] 10/22/2012, 5:42 PM

## 2012-10-22 NOTE — Progress Notes (Signed)
PATIENT DETAILS Name: [name] Age: 78 y.o. Sex: female Date of Birth: 11/08/1922 Admit Date: 10/16/2012 Admitting Physician Ripudeep Jenna Luo, MD WUJ:[id number], Sherilyn Cooter, MD  Subjective: Feeling much better today except for her breathing. She is still SOB.  She becomes particularly hypoxic when exerting herself.  Assessment/Plan: Principal Problem:  *Acute hypoxic respiratory failure  -2/2 HCAP with necrosis -titrate of O2 as tolerated.  Currently requiring 3 liters at rest and desaturating occassionally (was not on O2 pta) -on admission-required BIPAP support -increased work of breathing noted 10/30.  Active Problems: Healthcare-associated pneumonia- necrotizing -Afebrile, leukocytosis has resolved -D/c IV antibiotics and start oral levaquin -sputum c/s-not diagnostic -blood culture on 10/24-no growth   New EKG changes -Flipped her T waves in inf-lateral leads on 10/28-she was having burning chest pain and vomiting during this time -see notes from 10/28 -plan for medical management only  NSTEMI -Significantly elevated troponin 10/24 - 10/25.  (1.8) -more likely demand ischemia -likely has underlying CAD -cardiology recommending conservative treatment -on ASA,Plavix, Metoprolol and Statins -Echo-10/24-shows moderate hypokinesis in ant wall and apex and diastolic dysfunction.  Vomiting -not sure if this was related to NSTEMI -big issue 10/28. Kept NPO overnight and given IVF -resolved 10/29.  Will advance to D3 diet as tolerated on 10/30. -carafate PRN -stop Tramadol -Abd Xray-no significant changes (10/29) -supportive care and follow clinically  CHF -both systolic and diastolic -EF per YNWG-95-62 % -seems to be slightly overloaded today.  After conferring with cards PA will give 1 dose lasix and monitor.  Hemoptysis -Minimal red streaked sputum in suction tube. -2/2 to PNA-c/w Antibiotics -Spirometry  Anemia -Hbg stable.   -Uncertain etiology.  Will likely be  G+ due to recent hemoptysis. Jonne Ply, Plavix, SCDs.  Off of lovenox due to concern for bleeding.  Right Shoulder Pain -exam shows no swelling or overlying erythema -suspect OA -Xray shoulder-no significant abnormalities -better with Tylenol - patient has no complaints of pain with continued tylenol and heating pad.   Peripheral arterial disease -c/w ASA, plavix  CKD -stage 3.  Creatinine at baseline (approx 1.5) -monitor lytes periodically  Dementia -stable -c/w Aricept  Anxiety -some anxiety 2/2 to right shoulder pain -scheduled Xanax  Disposition: To ALF versus SNF depending on how she does.  Currently still very hypoxic.  DVT Prophylaxis: Prophylactic Lovenox   Code Status: DNR  Procedures:  None  CONSULTS:  cardiology and pulmonary/intensive care  PHYSICAL EXAM: Vital signs in last 24 hours: Filed Vitals:   10/22/12 0557 10/22/12 0900 10/22/12 0932 10/22/12 1045  BP: 147/70 132/60  143/75  Pulse: 75 76  84  Temp: 97.6 F (36.4 C) 97.2 F (36.2 C)    TempSrc: Oral Oral    Resp: 20 34    Height:      Weight: 47.9 kg (105 lb 9.6 oz)     SpO2: 99% 97% 96%     Weight change: -3.084 kg (-6 lb 12.8 oz) Body mass index is 20.62 kg/(m^2).   Gen Exam: Awake and alert with clear speech.   Neck: Supple, + JVD noted. Chest: Rales at left base, mildly increased work of breathing. CVS: S1 S2 Regular, no murmurs.  Abdomen: soft, BS +, non tender, non distended.  Extremities: no edema, lower extremities warm to touch.Right shoulder-not tender to palpation, no overlying erythema, not swollen Neurologic: Non Focal.   Skin: multiple bruises on arms. Wounds: N/A.    Intake/Output  +5478 over the course of the hospitalization.   LAB RESULTS: CBC  Lab 10/22/12 0645 10/21/12 0039 10/20/12 0948 10/17/12 0540 10/16/12 0835  WBC 4.8 6.1 7.6 9.1 16.3*  HGB 10.5* 9.6* 11.7* 10.6* 15.5*  HCT 33.5* 29.9* 36.6 33.4* 46.8*  PLT 141* 144* 164 152 299  MCV 88.2 86.7  87.6 87.9 88.8  MCH 27.6 27.8 28.0 27.9 29.4  MCHC 31.3 32.1 32.0 31.7 33.1  RDW 13.3 13.3 13.2 13.4 13.2  LYMPHSABS -- -- -- -- --  MONOABS -- -- -- -- --  EOSABS -- -- -- -- --  BASOSABS -- -- -- -- --  BANDABS -- -- -- -- --    Chemistries   Lab 10/22/12 0645 10/21/12 0039 10/20/12 0610 10/18/12 0530 10/17/12 1747  NA 135 137 133* 138 133*  K 3.9 4.2 5.7* 4.3 4.5  CL 103 105 100 108 102  CO2 23 23 24 23 20   GLUCOSE 86 95 115* 113* 153*  BUN 29* 34* 29* 28* 30*  CREATININE 1.40* 1.50* 1.32* 1.57* 1.50*  CALCIUM 9.0 8.5 9.4 8.6 8.9  MG -- -- -- -- --    Coagulation profile  Lab 10/16/12 0835  INR 1.06  PROTIME --    Cardiac Enzymes  Lab 10/21/12 0039 10/20/12 1815 10/20/12 1302 10/17/12 0021 10/16/12 1830 10/16/12 1452  CKMB -- -- -- 6.8* 8.8* 9.0*  TROPONINI 0.38* 0.37* <0.30 -- -- --  MYOGLOBIN -- -- -- -- -- --     MICROBIOLOGY: Recent Results (from the past 240 hour(s))  CULTURE, BLOOD (ROUTINE X 2)     Status: Normal   Collection Time   10/16/12  9:40 AM      Component Value Range Status Comment   Specimen Description BLOOD LEFT RADIAL   Final    Special Requests BOTTLES DRAWN AEROBIC ONLY [facility]   Final    Culture  Setup Time 10/16/2012 15:37   Final    Culture NO GROWTH 5 DAYS   Final    Report Status 10/22/2012 FINAL   Final   CULTURE, BLOOD (ROUTINE X 2)     Status: Normal   Collection Time   10/16/12  9:50 AM      Component Value Range Status Comment   Specimen Description BLOOD ARM LEFT   Final    Special Requests     Final    Value: BOTTLES DRAWN AEROBIC AND ANAEROBIC 10CC AER 3CC ANA   Culture  Setup Time 10/16/2012 15:37   Final    Culture NO GROWTH 5 DAYS   Final    Report Status 10/22/2012 FINAL   Final   MRSA PCR SCREENING     Status: Normal   Collection Time   10/16/12  1:55 PM      Component Value Range Status Comment   MRSA by PCR NEGATIVE  NEGATIVE Final   URINE CULTURE     Status: Normal   Collection Time   10/16/12  4:32 PM       Component Value Range Status Comment   Specimen Description URINE, CLEAN CATCH   Final    Special Requests Normal   Final    Culture  Setup Time 10/16/2012 17:23   Final    Colony Count NO GROWTH   Final    Culture NO GROWTH   Final    Report Status 10/17/2012 FINAL   Final   CULTURE, EXPECTORATED SPUTUM-ASSESSMENT     Status: Normal   Collection Time   10/17/12  6:32 AM      Component Value Range Status  Comment   Specimen Description SPUTUM   Final    Special Requests Normal   Final    Sputum evaluation     Final    Value: MICROSCOPIC FINDINGS SUGGEST THAT THIS SPECIMEN IS NOT REPRESENTATIVE OF LOWER RESPIRATORY SECRETIONS. PLEASE RECOLLECT.     Gram Stain Report Called to,Read Back By and Verified With: B BRADT,RN AT 0802 10/17/12 BY K BARR   Report Status 10/17/2012 FINAL   Final     RADIOLOGY STUDIES/RESULTS: Ct Angio Chest Pe W/cm &/or Wo Cm  10/16/2012  *RADIOLOGY REPORT*  Clinical Data: 78 year old female with peripheral artery disease. Hypertension.  Acute respiratory distress and hemoptysis.  CT ANGIOGRAPHY CHEST  Technique:  Multidetector CT imaging of the chest using the standard protocol during bolus administration of intravenous contrast. Multiplanar reconstructed images including MIPs were obtained and reviewed to evaluate the vascular anatomy.  Contrast: 80mL OMNIPAQUE IOHEXOL 350 MG/ML SOLN  Comparison: Portable chest radiograph the same day and earlier.  Findings: Good contrast bolus timing in the pulmonary arterial tree.  Mild respiratory motion artifact. No focal filling defect identified in the pulmonary arterial tree to suggest the presence of acute pulmonary embolism.  Extensive left greater than right lower lobe pulmonary consolidation.  Within the consolidated lung, typically on the left enhancing pulmonary vasculature is noted.  No definite areas of contrast extravasation within the lungs are identified.  Scattered areas of gas within the consolidated lung on the  left, some suspicious for early cavitary change, but no definite areas of pulmonary necrosis.  The right lower lobe is significantly less affected.  Underlying centrilobular emphysema.  Major airways are patent.  Cardiomegaly.  No pericardial effusion.  Only trace pleural effusions.  Negative visualized upper abdominal viscera.  No mediastinal lymphadenopathy.  Intermittent gaseous distention of the thoracic esophagus.  Negative visualized thoracic inlet.  No acute osseous abnormality identified.  IMPRESSION: 1. No evidence of acute pulmonary embolus.  No definite pulmonary vascular contrast extravasation identified in the abnormal left lung, see #2.  2.  Extensive left lower lobe consolidation.  Severe pneumonia or aspiration could have this appearance.  Necrotizing pneumonia cannot be excluded.  3.  Comparatively mild right lower lobe consolidation.  Trace bilateral pleural effusions.  4.  Underlying emphysema.  Cardiomegaly.   Original Report Authenticated By: Harley Hallmark, M.D.    Dg Chest Port 1 View  10/17/2012  *RADIOLOGY REPORT*  Clinical Data: Pneumonia  PORTABLE CHEST - 1 VIEW  Comparison: 10/16/2012  Findings: Extensive airspace disease within the left mid and lower lung zones has improved.  Cardiomegaly persist.  No pneumothorax. Stable appearance of the right lung with scattered scarring verse atelectasis.  IMPRESSION: Improving left lower lobe pneumonia.   Original Report Authenticated By: Donavan Burnet, M.D.    Dg Chest Portable 1 View  10/16/2012  *RADIOLOGY REPORT*  Clinical Data: Shortness of breath, respiratory distress  PORTABLE CHEST - 1 VIEW  Comparison: Portable exam 0857 hours compared to 06/13/2012  Findings: Minimal enlargement of cardiac silhouette. Mediastinal contours normal. Extensive airspace infiltrate identified in lingula and left lower lobe consistent with pneumonia. Aspiration not entirely excluded. Underlying emphysematous changes. Probable mild infiltrate at right  base as well. No gross pleural effusion or pneumothorax. Diffuse osseous demineralization.  IMPRESSION: Extensive airspace consolidation involving the lingula and left lower lobe consistent with pneumonia though aspiration not excluded. Minimal similar right basilar infiltrate.   Original Report Authenticated By: Lollie Marrow, M.D.    Dg Swallowing Func-speech Pathology  10/17/2012  Leah [name] McCoy, CCC-SLP     10/17/2012 12:07 PM Objective Swallowing Evaluation: Modified Barium Swallowing Study   Patient Details  Name: Aubriegh Celedon MRN: [id number] Date of Birth: 22-Sep-1922  Today's Date: 10/17/2012 Time: 4098-1191 SLP Time Calculation (min): 20 min  Past Medical History:  Past Medical History  Diagnosis Date  . Hypertension   . Peripheral arterial disease   . Cognitive disorder   . Esophageal stricture   . Osteoarthritis   . Anxiety   . UTI (lower urinary tract infection) June 2013  . COPD (chronic obstructive pulmonary disease)    Past Surgical History:  Past Surgical History  Procedure Date  . Femoral bypass   . Esophagogastroduodenoscopy 06/21/2012    Procedure: ESOPHAGOGASTRODUODENOSCOPY (EGD);  Surgeon: Theda Belfast, MD;  Location: [facility];  Service: Endoscopy;   Laterality: N/A;   HPI:  78 year old female who lives in an ALF. She has baseline  exertional dyspnea and has had cough (non-productive) for > 2  weeks which seems to be worse when she is recumbent. She also has  noticed the sensation of difficulty swallowing for the last 2  weeks (of note she has had prior esophageal dilations in past).  Was in usual stat of health until acutely the am of 10/24. States  had even been seen on 10/23 w/ clean bill of health by rounding  NP at ALF. She denies sick exposure, fevers, chills, chest pain,  nausea or vomiting. Has had not LE swelling. No wt gain or loss.  She presented to the ER in acute distress requiring NIPPV.   Presented to ER on 10/24 with acute on chronic dyspnea,  hemoptysis: initially bright  red, ~1/4 teaspoon in volume  estimated that she had a total of 6-8 episodes of this before it  started to subside: no more pink tinged. CXR indicates left  greater than right airspace disease.      Assessment / Plan / Recommendation Clinical Impression  Dysphagia Diagnosis: Suspected primary esophageal dysphagia;Mild  oral phase dysphagia;Mild pharyngeal phase dysphagia Clinical impression: Patient continues to present with a  suspected primary esophageal dysphagia with a mild, secondary,  oropharyngeal component. Overall function characterized by  intermittently delayed swallow initiation with larger thin liquid  bolus sizes, when combined with mildly decreased base of tongue  strength, decreased hyo-laryngeal elevation, excursion, and UES  relaxation, result in trace penetrates which are spontaneously  cleared. Otherwise, full airway protection noted. Spontaneous dry  swallow additonally successful to clear trace-minimal pharyngeal  residuals post swallow. Although combination of esophageal and  pharyngeal components does increase aspiration risk, patient  judged safe to resume a po diet with aspiration precautions at  this time. SLP will f/u closely at bedside for continued  education and diet tolerance.     Treatment Recommendation  Therapy as outlined in treatment plan below    Diet Recommendation Dysphagia 3 (Mechanical Soft);Thin liquid   Liquid Administration via: Cup;Straw Medication Administration: Whole meds with liquid Supervision: Patient able to self feed;Intermittent supervision  to cue for compensatory strategies Compensations: Slow rate;Small sips/bites Postural Changes and/or Swallow Maneuvers: Seated upright 90  degrees;Upright 30-60 min after meal    Other  Recommendations Oral Care Recommendations: Oral care BID   Follow Up Recommendations  None    Frequency and Duration min 2x/week  2 weeks       SLP Swallow Goals Patient will utilize recommended strategies during swallow to  increase  swallowing safety with: Independent assistance  Swallow Study Goal #2 - Progress: Not met   General HPI: 78 year old female who lives in an ALF. She has  baseline exertional dyspnea and has had cough (non-productive)  for > 2 weeks which seems to be worse when she is recumbent. She  also has noticed the sensation of difficulty swallowing for the  last 2 weeks (of note she has had prior esophageal dilations in  past). Was in usual stat of health until acutely the am of 10/24.  States had even been seen on 10/23 w/ clean bill of health by  rounding NP at ALF. She denies sick exposure, fevers, chills,  chest pain, nausea or vomiting. Has had not LE swelling. No wt  gain or loss. She presented to the ER in acute distress requiring  NIPPV.  Presented to ER on 10/24 with acute on chronic dyspnea,  hemoptysis: initially bright red, ~1/4 teaspoon in volume  estimated that she had a total of 6-8 episodes of this before it  started to subside: no more pink tinged. CXR indicates left  greater than right airspace disease.  Type of Study: Modified Barium Swallowing Study Reason for Referral: Objectively evaluate swallowing function Previous Swallow Assessment: Bedside swallow evaluation complete  during previous [facility] admission on 06/16/12 and indicated a suspected  primary esophageal dysphagia. Barium swallow complete during that  same admission indicated Marked esophageal dysmotility with  possible diffuse esophageal Diet Prior to this Study: NPO Temperature Spikes Noted: Yes Respiratory Status: Supplemental O2 delivered via (comment) (2 L  nasal cannula) History of Recent Intubation: No Behavior/Cognition: Alert;Cooperative;Pleasant mood Oral Cavity - Dentition: Adequate natural dentition Oral Motor / Sensory Function: Impaired motor Oral impairment: Right lingual (tongue deviates to the right, no  other focal deficits) Self-Feeding Abilities: Able to feed self Patient Positioning: Upright in bed Baseline Vocal Quality: Clear  Volitional Cough: Strong Volitional Swallow: Able to elicit Anatomy: Within functional limits Pharyngeal Secretions: Not observed secondary MBS    Reason for Referral Objectively evaluate swallowing function   Oral Phase Oral Preparation/Oral Phase Oral Phase: WFL   Pharyngeal Phase Pharyngeal Phase Pharyngeal Phase: Impaired Pharyngeal - Thin Pharyngeal - Thin Cup: Delayed swallow initiation;Premature  spillage to valleculae;Pharyngeal residue - valleculae;Pharyngeal  residue - pyriform sinuses;Reduced tongue base retraction Pharyngeal - Thin Straw: Delayed swallow initiation;Premature  spillage to pyriform sinuses;Reduced tongue base  retraction;Penetration/Aspiration before swallow;Pharyngeal  residue - pyriform sinuses;Pharyngeal residue - valleculae Penetration/Aspiration details (thin straw): Material enters  airway, CONTACTS cords then ejected out (spontaneous throat clear  to clear trace penetrates) Pharyngeal - Solids Pharyngeal - Puree: Reduced tongue base retraction;Pharyngeal  residue - valleculae Pharyngeal - Mechanical Soft: Reduced tongue base  retraction;Pharyngeal residue - valleculae Pharyngeal - Pill: Within functional limits (provided whole with  thin liquid)  Cervical Esophageal Phase    GO   Leah McCoy MA, CCC-SLP [phone]  Cervical Esophageal Phase Cervical Esophageal Phase: Impaired Cervical Esophageal Phase - Thin Thin Cup: Reduced cricopharyngeal relaxation Thin Straw: Reduced cricopharyngeal relaxation Cervical Esophageal Phase - Solids Puree: Reduced cricopharyngeal relaxation Mechanical Soft: Reduced cricopharyngeal relaxation Pill: Reduced cricopharyngeal relaxation (mild)         McCoy Leah [name] 10/17/2012, 12:06 PM      MEDICATIONS: Scheduled Meds:    . acetaminophen  1,000 mg Oral TID  . ALPRAZolam  0.25 mg Oral BID  . aspirin  81 mg Oral Daily  . cholecalciferol  1,000 Units Oral Daily  . clopidogrel  75 mg Oral Q breakfast  . donepezil  5 mg Oral QHS  . feeding  supplement  237 mL Oral BID BM  . Fluticasone-Salmeterol  1 puff Inhalation Q12H  . furosemide  20 mg Intravenous Once  . guaiFENesin  600 mg Oral BID  . isosorbide mononitrate  30 mg Oral Daily  . metoprolol tartrate  25 mg Oral BID  . mirtazapine  7.5 mg Oral QHS  . montelukast  10 mg Oral q morning - 10a  . pantoprazole (PROTONIX) IV  40 mg Intravenous Q12H  . simvastatin  10 mg Oral q1800  . sodium chloride  2 spray Nasal q morning - 10a  . sucralfate  1 g Oral TID WC & HS  . tetrahydrozoline  1 drop Both Eyes QID  . DISCONTD: enoxaparin (LOVENOX) injection  30 mg Subcutaneous Q24H  . DISCONTD: imipenem-cilastatin  250 mg Intravenous Q12H  . DISCONTD: metoCLOPramide (REGLAN) injection  5 mg Intravenous Q8H  . DISCONTD: vancomycin  500 mg Intravenous Q48H   Continuous Infusions:   PRN Meds:.acetaminophen, dextromethorphan, gi cocktail, ipratropium, levalbuterol, nitroGLYCERIN, ondansetron (ZOFRAN) IV, polyvinyl alcohol  Antibiotics: Anti-infectives     Start     Dose/Rate Route Frequency Ordered Stop   10/18/12 1000   levofloxacin (LEVAQUIN) IVPB 500 mg  Status:  Discontinued        500 mg 100 mL/hr over 60 Minutes Intravenous Every 48 hours 10/16/12 1414 10/16/12 1446   10/18/12 1000   vancomycin (VANCOCIN) 500 mg in sodium chloride 0.9 % 100 mL IVPB  Status:  Discontinued        500 mg 100 mL/hr over 60 Minutes Intravenous Every 48 hours 10/16/12 1415 10/22/12 1331   10/16/12 1600   imipenem-cilastatin (PRIMAXIN) 250 mg in sodium chloride 0.9 % 100 mL IVPB  Status:  Discontinued        250 mg 200 mL/hr over 30 Minutes Intravenous Every 12 hours 10/16/12 1456 10/22/12 1331   10/16/12 1345   levofloxacin (LEVAQUIN) IVPB 750 mg  Status:  Discontinued        750 mg 100 mL/hr over 90 Minutes Intravenous Every 24 hours 10/16/12 1344 10/16/12 1348   10/16/12 1015   vancomycin (VANCOCIN) IVPB 1000 mg/200 mL premix        1,000 mg 200 mL/hr over 60 Minutes Intravenous  Once  10/16/12 1006 10/16/12 1601   10/16/12 0930   levofloxacin (LEVAQUIN) IVPB 750 mg  Status:  Discontinued        750 mg 100 mL/hr over 90 Minutes Intravenous Every 24 hours 10/16/12 0925 10/16/12 1414           Conley Canal [facility] Pager:[phone]  If 7PM-7AM, please contact night-coverage [email or web address] Password [email or web address] 10/22/2012, 1:39 PM   LOS: 6 days

## 2012-10-23 DIAGNOSIS — D696 Thrombocytopenia, unspecified: Secondary | ICD-10-CM

## 2012-10-23 MED ORDER — IPRATROPIUM BROMIDE 0.02 % IN SOLN
0.5000 mg | Freq: Four times a day (QID) | RESPIRATORY_TRACT | Status: DC
  Administered 2012-10-23: 0.5 mg via RESPIRATORY_TRACT
  Filled 2012-10-23: qty 2.5

## 2012-10-23 MED ORDER — ALBUTEROL SULFATE HFA 108 (90 BASE) MCG/ACT IN AERS
2.0000 | INHALATION_SPRAY | RESPIRATORY_TRACT | Status: DC | PRN
  Filled 2012-10-23: qty 6.7

## 2012-10-23 MED ORDER — ALBUTEROL SULFATE (5 MG/ML) 0.5% IN NEBU
2.5000 mg | INHALATION_SOLUTION | Freq: Four times a day (QID) | RESPIRATORY_TRACT | Status: DC | PRN
  Administered 2012-10-23: 2.5 mg via RESPIRATORY_TRACT
  Filled 2012-10-23: qty 0.5

## 2012-10-23 MED ORDER — IPRATROPIUM BROMIDE 0.02 % IN SOLN: 0.5000 mg | Freq: Four times a day (QID) | RESPIRATORY_TRACT | Status: DC | PRN

## 2012-10-23 MED ORDER — ENSURE COMPLETE PO LIQD: 237.0000 mL | Freq: Two times a day (BID) | ORAL | Status: DC

## 2012-10-23 MED ORDER — IPRATROPIUM BROMIDE 0.02 % IN SOLN
0.5000 mg | Freq: Three times a day (TID) | RESPIRATORY_TRACT | Status: DC
  Administered 2012-10-23 – 2012-10-24 (×2): 0.5 mg via RESPIRATORY_TRACT
  Filled 2012-10-23 (×3): qty 2.5

## 2012-10-23 MED ORDER — ALBUTEROL SULFATE HFA 108 (90 BASE) MCG/ACT IN AERS
2.0000 | INHALATION_SPRAY | Freq: Three times a day (TID) | RESPIRATORY_TRACT | Status: DC
  Administered 2012-10-23: 2 via RESPIRATORY_TRACT
  Filled 2012-10-23: qty 6.7

## 2012-10-23 MED ORDER — ALBUTEROL SULFATE (5 MG/ML) 0.5% IN NEBU
2.5000 mg | INHALATION_SOLUTION | Freq: Three times a day (TID) | RESPIRATORY_TRACT | Status: DC
  Administered 2012-10-23 – 2012-10-24 (×2): 2.5 mg via RESPIRATORY_TRACT
  Filled 2012-10-23 (×3): qty 0.5

## 2012-10-23 MED ORDER — ALBUTEROL SULFATE HFA 108 (90 BASE) MCG/ACT IN AERS: 2.0000 | INHALATION_SPRAY | RESPIRATORY_TRACT | Status: DC | PRN

## 2012-10-23 MED ORDER — WHITE PETROLATUM GEL
Status: AC
  Administered 2012-10-23: 0.2
  Filled 2012-10-23: qty 5

## 2012-10-23 NOTE — Progress Notes (Signed)
Physical Therapy Treatment Patient Details Name: [name] MRN: [id number] DOB: 1922-08-26 Today's Date: 10/23/2012 Time: 1030-1059 PT Time Calculation (min): 29 min  PT Assessment / Plan / Recommendation Comments on Treatment Session  Pt with NSTEMI, PNA, resp failure who continuues to progress with activity today. Pt with decreased sats to 85% on 4L with in room ambulation but able to maintain 97% on 6L via [place] with hall ambulation which is greatly improved from last visit. Pt encouraged to continue mobility with nursing and HEP.     Follow Up Recommendations        Does the patient have the potential to tolerate intense rehabilitation     Barriers to Discharge        Equipment Recommendations       Recommendations for Other Services    Frequency     Plan Discharge plan remains appropriate;Frequency remains appropriate    Precautions / Restrictions Precautions Precautions: Fall   Pertinent Vitals/Pain No pain Sat remained greater than 85% throughout today    Mobility  Bed Mobility Bed Mobility: Supine to Sit Supine to Sit: HOB elevated;With rails;6: Modified independent (Device/Increase time) (HOB 30 degrees) Sitting - Scoot to Edge of Bed: 6: Modified independent (Device/Increase time) Details for Bed Mobility Assistance: pt able to complete bed mobility without physical assist today with increased time Transfers Sit to Stand: 4: Min guard;4: Min assist;From bed;From toilet Stand to Sit: To toilet;To chair/3-in-1;4: Min assist;With armrests Details for Transfer Assistance: increased assist to stand from low commode with cueing for use of rail and anterior translation, with sitting cueing for use of armrests and safety with fully backing to surface Ambulation/Gait Ambulation/Gait Assistance: 4: Min guard Ambulation Distance (Feet): 150 Feet (15', 150') Assistive device: Rolling walker Ambulation/Gait Assistance Details: cueing for posture and position in RW  Gait  Pattern: Step-through pattern;Decreased stride length;Trunk flexed Gait velocity: decreased Stairs: No    Exercises General Exercises - Lower Extremity Long Arc Quad: AROM;Both;20 reps;Seated Hip Flexion/Marching: AROM;Both;20 reps;Seated   PT Diagnosis:    PT Problem List:   PT Treatment Interventions:     PT Goals Acute Rehab PT Goals Pt will go Supine/Side to Sit: with modified independence;with HOB 0 degrees PT Goal: Supine/Side to Sit - Progress: Updated due to goal met PT Goal: Sit to Stand - Progress: Progressing toward goal PT Goal: Stand to Sit - Progress: Progressing toward goal Pt will Ambulate: >150 feet;with supervision;with least restrictive assistive device PT Goal: Ambulate - Progress: Updated due to goal met  Visit Information  Last PT Received On: 10/23/12 Assistance Needed: +1    Subjective Data  Subjective: "I needed that"   Cognition  Overall Cognitive Status: History of cognitive impairments - at baseline Arousal/Alertness: Awake/alert Orientation Level: Appears intact for tasks assessed Behavior During Session: [facility] for tasks performed    Balance     End of Session PT - End of Session Activity Tolerance: Patient tolerated treatment well Patient left: in chair;with call bell/phone within reach;with nursing in room Nurse Communication: Mobility status   GP     Toney Sang Beth 10/23/2012, 11:08 AM Delaney Meigs, PT [phone]

## 2012-10-23 NOTE — Discharge Summary (Signed)
Physician Discharge Summary  Izabella Marcantel ZOX:[id number] DOB: 21-Aug-1922 DOA: 10/16/2012  PCP: [name], MD CC:  Dr. Vernona Rieger, Fax number: [phone]         Dr. Sherryl Manges, [facility]         Ms. Rubye Oaks, NP, [facility]   Admit date: 10/16/2012 Discharge date: 10/24/2012  Time spent:  45 minutes  Recommendations for Outpatient Follow-up:  To skilled nursing for physical rehabilitation, speech therapy and respiratory therapy. Monitor breathing status and oxygen requirements.  Wean O2 to oxygen sats of 92 - 94% Monitor volume status.  Recent NSTEMI, chronic CHF.  Discharge Diagnoses:  Principal Problem:  *Acute hypoxic respiratory failure  Active Problems:  Healthcare-associated pneumonia  NSTEMI, initial episode of care  Systolic and diastolic CHF, acute on chronic  Peripheral arterial disease  Elevated troponin  Hemoptysis  Oral thrush  Sacral pressure sore   Discharge Condition: stable.  Still becomes quite SOB on exertion.  Requiring 4 - 6 L of oxygen. Stage 1 sacral decubitus.  Diet recommendation: Ensure complete vanilla bid, soft solid diet with increased liquids. Utilize aspiration precautions.  Filed Weights   10/21/12 0446 10/22/12 0557 10/24/12 0537  Weight: 50.984 kg (112 lb 6.4 oz) 47.9 kg (105 lb 9.6 oz) 52.1 kg (114 lb 13.8 oz)    History of present illness:  Patient is a 78 year old female (DNR) with history of peripheral arterial disease, hypertension, osteoarthritis, history of a esophageal stricture status post dilatation in June 2013 was sent from skilled nursing facility for acute respiratory distress today. At the time of my evaluation patient was on BiPAP, daughter present in the room and was able to converse. Per the daughter, she saw the patient yesterday and was "fine". Patient states that she was having coughing and worsening shortness of breath since last night. She then developed hemoptysis with more than 4 or 5  episodes today morning with coughing. Patient reported that she noticed "fresh blood" with coughing. Patient was placed on CPAP via EMS. She also reported some left-sided chest pain posteriorly worse with coughing. At the time of arrival, O2 sats were in the 70s on room air and respiratory rate in 50s to 60s. Patient's daughter reported that she has been having dyspnea on exertion, progressively worsening over last 3-4 months.  Hospital Course:   *Acute hypoxic respiratory failure  Secondary to necrotizing HCAP with hemoptysis.  Blood cultures negative, Sputum cultures (not a good sample) She required BIPAP support on admission and is still requiring O2 via nasal canula 4 L at rest and 6 liters when mobile.  The patient was able to ambulate 300 feet on 6 liters of oxygen today and maintain oxygen saturations in the 90s.  Necrotizing Aspiration vs Healthcare-associated pneumonia- in the setting of chronic COPD. Evaluated by Pulmonary Critical Care, Dr. Craige Cotta. Initially she was treated with IV Vancomycin and Levaquin.  Now with one of levaquin left 10/25/12. She has been afebrile for 3 - 4 days.  Leukocytosis has resolved.  The patient experience hemoptysis and required suction.  The hemoptysis trailed off and the patient no longer requires suction but still has difficulty taking a deep breath and appears to have significant congestion in her chest that needs to be cleaned out.  She will remain on mucinex bid, nebulizer therapy and spirometry.   Speech therapy evaluated and did not find symptoms of aspiration.  They recommended a dysphagia 3 diet and aspiration precautions.  NSTEMI  Mrs. Chandra had significantly elevated troponin  10/24 - 10/25. (1.8).  This was felt to be demand ischemia secondary to pneumonia. She was already on aspirin, plavix, metoprolol and statins for her history of CAD.  Echo-10/24-showed moderate hypokinesis in ant wall and apex and diastolic dysfunction.   On 10/28 she developed  burning chest pain and began vomiting.  She developed new EKG changes specifically, flipped T waves in inf-lateral leads. Cardiology consulted and recommended medical management as she is very frail and at the time could not undergo any invasive procedures.   Vomiting  The patient experienced multiple episodes of vomiting on 10/28.  She complained of burning in her chest.  She was placed on BID protonix and scheduled carafate.  Tramadol was stopped.  Her symptoms improved and her diet was advanced.   Dysphagia The patient has a history of dysphagia requiring an esophageal dilation in June 2013.  She been able to advance her diet very slowly but seems to prefer ensure liquid and very soft foods.  She has difficulty swallowing pills.  They must be crushed for her.  This am (11/1) she was noted to have mild thrush in her mouth.  She will be treated with 1 dose of diflucan and nystatin swish and swallow.  This may be contributing to her swallowing difficulty.  We recommend speech therapy continue working with her at the skilled nursing facility and that if she continues to have difficulty swallowing, she may need a repeat dilation.  CHF  Both systolic and diastolic.  EF per Echo-40-45 %.  She became overloaded with increased JVD 10/30. Received 20 mg IV lasix. She is more comfortable sleeping sitting up in her chair and has difficulty lying flat.  Dr. [name] Hashimoto advised against scheduled lasix as she is so frail and would be easily over diuresed.  She will be discharged with lasix 20 mg daily PRN for SOB or increased JVD.  Follow up is scheduled with Cardiology on 12/2.  Anemia  During her hospitalization her Hgb dropped to 9.6.  It appears her baseline is approximately 11.  Hbg climbing (10.5 on 10/30).  Uncertain etiology. Stool will likely be G+ due to recent hemoptysis. She was continued on Asa and Plavix.  Monitor outpatient.   Right Shoulder Pain  The patient has ongoing right shoulder pain.  Exam shows  no swelling or overlying erythema, suspect OA.  Xray of the shoulder shows no significant abnormalities.  She was treated with tyenol and a heating pad and her pain was relieved.  IBS Ms. Kihn has IBS and experienced multiple "Blow outs" in the hospital.  She had been given kayexalate for hyperkalemia which was felt to cause the blow outs for several days.  This am she had a good regular BM.  Peripheral arterial disease  Continue with ASA, plavix   CKD  Stage 3. Creatinine at baseline (approx 1.5).  Creatinine at 1.18 at d/c.  Dementia (also has hearing loss) Stable continue with Aricept.  Anxiety  Patient experienced anxiety secondary to her health related concerns.  She was placed on Xanax .25 mg BID in house.  We recommend weaning her Xanax after she is comfortable at the skilled nursing facility.  Oral Thrush Observed 11/1.  Given PO diflucan (1 dose) and started on nystatin swish and swallow.  Stage 1 sacral decubitus Thick barrier cream employed.  Patient will need frequent repositioning and monitoring of this area to ensure it doesn't worsen.   Consultations:  Cardiology, Dr. [name] Hashimoto  Pulmonary critical care, Dr. Craige Cotta  Discharge Exam: Filed Vitals:   10/23/12 2146 10/24/12 0537 10/24/12 0802 10/24/12 0846  BP:  137/63    Pulse:  70    Temp:  97.4 F (36.3 C)    TempSrc:  Oral    Resp:  20    Height:      Weight:  52.1 kg (114 lb 13.8 oz)    SpO2: 93% 97% 94% 95%    General: A&O smiling, extremely pleasant, sitting in chair, NAD.  Hard of Hearing. Cardiovascular: RRR no M/R/G.  No edema in extremities. Respiratory: Shallow inspirations, but no obvious w/c/r Abdomen:  Thin soft, NT, ND, +BS Skin:  Multiple bruises on arms.  Paper thin skin.     Discharge Instructions      Discharge Orders    Future Appointments: Provider: Department: Dept Phone: Center:   11/06/2012 11:30 AM Julio Sicks, NP [facility] [phone] None   11/25/2012 9:45 AM  Vesta Mixer, MD [facility] [phone] [facility]     Future Orders Please Complete By Expires   Diet - low sodium heart healthy      Increase activity slowly      Discharge instructions      Comments:   Patient currently requiring 4L of oxygen at rest and 6L of oxygen with ambulation to maintain oxygen sats in the mid 90s.    She has a stage 1 sacral pressure ulceration, oropharyngeal thrush with some difficulty swallowing.   She is delicate from a volume stand point with CHF however she can easily be over-diuresed.       Medication List     As of 10/24/2012 10:59 AM    STOP taking these medications         pravastatin 20 MG tablet   Commonly known as: PRAVACHOL      promethazine 12.5 MG tablet   Commonly known as: PHENERGAN      TAKE these medications         albuterol 108 (90 BASE) MCG/ACT inhaler   Commonly known as: PROVENTIL HFA;VENTOLIN HFA   Inhale 2 puffs into the lungs every 4 (four) hours as needed. Wheezing/shortness of breath.      albuterol (5 MG/ML) 0.5% nebulizer solution   Commonly known as: PROVENTIL   Take 0.5 mLs (2.5 mg total) by nebulization every 6 (six) hours as needed for wheezing or shortness of breath.      ALPRAZolam 0.25 MG tablet   Commonly known as: XANAX   Take 0.25 mg by mouth 2 (two) times daily as needed. For anxiety      aspirin 81 MG chewable tablet   Chew 81 mg by mouth daily.      benzocaine 20 % Pste   Commonly known as: ORABASE-B   Use as directed 1 application in the mouth or throat 4 (four) times daily as needed. For pain or discomfort      Carboxymethylcellul-Glycerin 0.5-0.9 % Soln   Place 1 drop into both eyes 4 (four) times daily as needed. For dry eyes      clopidogrel 75 MG tablet   Commonly known as: PLAVIX   Take 75 mg by mouth daily.      donepezil 5 MG tablet   Commonly known as: ARICEPT   Take 5 mg by mouth at bedtime.      feeding supplement Liqd   Take 237 mLs by mouth 2 (two) times  daily between meals.      Fluticasone-Salmeterol 100-50 MCG/DOSE Aepb  Commonly known as: ADVAIR   Inhale 1 puff into the lungs every 12 (twelve) hours.      furosemide 20 MG tablet   Commonly known as: LASIX   Take 1 tablet (20 mg total) by mouth daily as needed (shortness of breath or increased JVD. Be careful not to over diurese).      guaiFENesin 600 MG 12 hr tablet   Commonly known as: MUCINEX   Take 2 tablets (1,200 mg total) by mouth 2 (two) times daily.      isosorbide mononitrate 30 MG 24 hr tablet   Commonly known as: IMDUR   Take 1 tablet (30 mg total) by mouth daily.      levofloxacin 500 MG tablet   Commonly known as: LEVAQUIN   Take 1 tablet (500 mg total) by mouth every other day.   Start taking on: 10/25/2012      MAPAP 325 MG tablet   Generic drug: acetaminophen   Take 650 mg by mouth every 6 (six) hours as needed. For pain      Melatonin 1 MG Tabs   Take 1 mg by mouth at bedtime.      metoprolol tartrate 25 MG tablet   Commonly known as: LOPRESSOR   Take 25 mg by mouth 2 (two) times daily.      mirtazapine 15 MG tablet   Commonly known as: REMERON   Take 7.5 mg by mouth at bedtime.      montelukast 10 MG tablet   Commonly known as: SINGULAIR   Take 10 mg by mouth every morning.      nitroGLYCERIN 0.4 MG SL tablet   Commonly known as: NITROSTAT   Place 1 tablet (0.4 mg total) under the tongue every 5 (five) minutes x 3 doses as needed for chest pain.      nystatin 100000 UNIT/ML suspension   Commonly known as: MYCOSTATIN   Take 5 mLs (500,000 Units total) by mouth 4 (four) times daily.      pantoprazole 40 MG tablet   Commonly known as: PROTONIX   Take 1 tablet (40 mg total) by mouth daily before breakfast.      ranitidine 300 MG tablet   Commonly known as: ZANTAC   Take 300 mg by mouth 2 (two) times daily.      sodium chloride 0.65 % Soln nasal spray   Commonly known as: OCEAN   Place 2 sprays into the nose every morning. May keep at  bedside.      sucralfate 1 GM/10ML suspension   Commonly known as: CARAFATE   Take 10 mLs (1 g total) by mouth 3 (three) times daily as needed (indigestion).      tetrahydrozoline 0.05 % ophthalmic solution   Place 1 drop into both eyes 4 (four) times daily.      Vitamin D 1000 UNITS capsule   Take 1,000 Units by mouth daily.         Follow-up Information    Follow up with PARRETT,TAMMY, NP. On 11/06/2012. (11:30)    Contact information:   [facility] [address] [place] [address] [phone]       Follow up with Elyn Aquas., MD. On 11/25/2012. (9:45)    Contact information:   [address] [place] [address] [phone] [facility]          The results of significant diagnostics from this hospitalization (including imaging, microbiology, ancillary and laboratory) are listed below for reference.  Significant Diagnostic Studies: Dg Chest 1 View  10/20/2012  *RADIOLOGY REPORT*  Clinical Data: Nausea and vomiting with abdominal pain.  Ileus, weakness and shortness of breath.  CHEST - 1 VIEW  Comparison: 10/17/2012 and CT chest 10/16/2012.  Findings: Heart is enlarged, stable.  Thoracic aorta is calcified. Lungs are emphysematous.  Continued improvement in left lower lobe pneumonia.  Small left pleural effusion.  IMPRESSION: Improving left lower lobe pneumonia superimposed on emphysema. Small left pleural effusion.   Original Report Authenticated By: Reyes Ivan, M.D.    Dg Shoulder Right  10/19/2012  *RADIOLOGY REPORT*  Clinical Data: Shoulder pain for 2 months, severe over the last 3 days.  RIGHT SHOULDER - 2+ VIEW  Comparison: Chest radiographs 10/17/2012.  Chest CT 10/16/2012.  Findings: The mineralization and alignment are normal.  There is no evidence of acute fracture or dislocation.  The subacromial space is preserved.  There are mild acromioclavicular degenerative changes with a small subacromial spur.  Patchy right  lung opacities appear grossly unchanged.  IMPRESSION: No acute osseous findings demonstrated.  Mild degenerative changes.   Original Report Authenticated By: Gerrianne Scale, M.D.    Dg Abd 1 View  10/20/2012  *RADIOLOGY REPORT*  Clinical Data: Abdominal pain, weakness  ABDOMEN - 1 VIEW  Comparison: 06/13/2012  Findings: Contrast material is noted within the colon.  Multiple diverticula are noted within the sigmoid colon.  No abnormal mass or abnormal calcifications are noted.  Mild scoliosis concave to the right is seen.  Postsurgical changes in the inguinal region are noted bilaterally.  Changes are noted in the left lung base similar to that seen on recent chest x-ray.  IMPRESSION: No acute intra-abdominal abnormality is noted.  Diverticular disease is seen.   Original Report Authenticated By: Phillips Odor, M.D.    Ct Angio Chest Pe W/cm &/or Wo Cm  10/16/2012  *RADIOLOGY REPORT*  Clinical Data: 78 year old female with peripheral artery disease. Hypertension.  Acute respiratory distress and hemoptysis.  CT ANGIOGRAPHY CHEST  Technique:  Multidetector CT imaging of the chest using the standard protocol during bolus administration of intravenous contrast. Multiplanar reconstructed images including MIPs were obtained and reviewed to evaluate the vascular anatomy.  Contrast: 80mL OMNIPAQUE IOHEXOL 350 MG/ML SOLN  Comparison: Portable chest radiograph the same day and earlier.  Findings: Good contrast bolus timing in the pulmonary arterial tree.  Mild respiratory motion artifact. No focal filling defect identified in the pulmonary arterial tree to suggest the presence of acute pulmonary embolism.  Extensive left greater than right lower lobe pulmonary consolidation.  Within the consolidated lung, typically on the left enhancing pulmonary vasculature is noted.  No definite areas of contrast extravasation within the lungs are identified.  Scattered areas of gas within the consolidated lung on the left, some  suspicious for early cavitary change, but no definite areas of pulmonary necrosis.  The right lower lobe is significantly less affected.  Underlying centrilobular emphysema.  Major airways are patent.  Cardiomegaly.  No pericardial effusion.  Only trace pleural effusions.  Negative visualized upper abdominal viscera.  No mediastinal lymphadenopathy.  Intermittent gaseous distention of the thoracic esophagus.  Negative visualized thoracic inlet.  No acute osseous abnormality identified.  IMPRESSION: 1. No evidence of acute pulmonary embolus.  No definite pulmonary vascular contrast extravasation identified in the abnormal left lung, see #2.  2.  Extensive left lower lobe consolidation.  Severe pneumonia or aspiration could have this appearance.  Necrotizing pneumonia cannot be excluded.  3.  Comparatively mild right lower lobe consolidation.  Trace bilateral pleural effusions.  4.  Underlying emphysema.  Cardiomegaly.   Original Report Authenticated By: Harley Hallmark, M.D.    Dg Chest Port 1 View  10/17/2012  *RADIOLOGY REPORT*  Clinical Data: Pneumonia  PORTABLE CHEST - 1 VIEW  Comparison: 10/16/2012  Findings: Extensive airspace disease within the left mid and lower lung zones has improved.  Cardiomegaly persist.  No pneumothorax. Stable appearance of the right lung with scattered scarring verse atelectasis.  IMPRESSION: Improving left lower lobe pneumonia.   Original Report Authenticated By: Donavan Burnet, M.D.    Dg Chest Portable 1 View  10/16/2012  *RADIOLOGY REPORT*  Clinical Data: Shortness of breath, respiratory distress  PORTABLE CHEST - 1 VIEW  Comparison: Portable exam 0857 hours compared to 06/13/2012  Findings: Minimal enlargement of cardiac silhouette. Mediastinal contours normal. Extensive airspace infiltrate identified in lingula and left lower lobe consistent with pneumonia. Aspiration not entirely excluded. Underlying emphysematous changes. Probable mild infiltrate at right base as well.  No gross pleural effusion or pneumothorax. Diffuse osseous demineralization.  IMPRESSION: Extensive airspace consolidation involving the lingula and left lower lobe consistent with pneumonia though aspiration not excluded. Minimal similar right basilar infiltrate.   Original Report Authenticated By: Lollie Marrow, M.D.    Dg Abd Portable 2v  10/21/2012  *RADIOLOGY REPORT*  Clinical Data: Abdominal discomfort, vomiting  PORTABLE ABDOMEN - 2 VIEW  Comparison: Abdomen film of 10/20/2012, CT abdomen pelvis of 06/13/2012  Findings: Portable supine and erect views of the abdomen show no bowel obstruction.  Air is noted to the rectum.  No free air is seen on the erect view.  Abnormal opacity is noted particularly at the left lung base. In review of the CT from 06/13/2012 the left lung base appeared clear, and therefore this opacity may represent pneumonia.  IMPRESSION: No bowel obstruction.  No free air.  Abnormal opacity at the left lung base, possibly pneumonia as noted above.   Original Report Authenticated By: Juline Patch, M.D.     Microbiology: Recent Results (from the past 240 hour(s))  CULTURE, BLOOD (ROUTINE X 2)     Status: Normal   Collection Time   10/16/12  9:40 AM      Component Value Range Status Comment   Specimen Description BLOOD LEFT RADIAL   Final    Special Requests BOTTLES DRAWN AEROBIC ONLY [facility]   Final    Culture  Setup Time 10/16/2012 15:37   Final    Culture NO GROWTH 5 DAYS   Final    Report Status 10/22/2012 FINAL   Final   CULTURE, BLOOD (ROUTINE X 2)     Status: Normal   Collection Time   10/16/12  9:50 AM      Component Value Range Status Comment   Specimen Description BLOOD ARM LEFT   Final    Special Requests     Final    Value: BOTTLES DRAWN AEROBIC AND ANAEROBIC 10CC AER 3CC ANA   Culture  Setup Time 10/16/2012 15:37   Final    Culture NO GROWTH 5 DAYS   Final    Report Status 10/22/2012 FINAL   Final   MRSA PCR SCREENING     Status: Normal   Collection Time     10/16/12  1:55 PM      Component Value Range Status Comment   MRSA by PCR NEGATIVE  NEGATIVE Final   URINE CULTURE     Status: Normal  Collection Time   10/16/12  4:32 PM      Component Value Range Status Comment   Specimen Description URINE, CLEAN CATCH   Final    Special Requests Normal   Final    Culture  Setup Time 10/16/2012 17:23   Final    Colony Count NO GROWTH   Final    Culture NO GROWTH   Final    Report Status 10/17/2012 FINAL   Final   CULTURE, EXPECTORATED SPUTUM-ASSESSMENT     Status: Normal   Collection Time   10/17/12  6:32 AM      Component Value Range Status Comment   Specimen Description SPUTUM   Final    Special Requests Normal   Final    Sputum evaluation     Final    Value: MICROSCOPIC FINDINGS SUGGEST THAT THIS SPECIMEN IS NOT REPRESENTATIVE OF LOWER RESPIRATORY SECRETIONS. PLEASE RECOLLECT.     Gram Stain Report Called to,Read Back By and Verified With: B BRADT,RN AT 0802 10/17/12 BY K BARR   Report Status 10/17/2012 FINAL   Final      Labs: Basic Metabolic Panel:  Lab 10/24/12 0454 10/22/12 0645 10/21/12 0039 10/20/12 0610 10/18/12 0530  NA 140 135 137 133* 138  K 3.6 3.9 4.2 5.7* 4.3  CL 103 103 105 100 108  CO2 32 23 23 24 23   GLUCOSE 92 86 95 115* 113*  BUN 22 29* 34* 29* 28*  CREATININE 1.18* 1.40* 1.50* 1.32* 1.57*  CALCIUM 8.9 9.0 8.5 9.4 8.6  MG -- -- -- -- --  PHOS -- -- -- -- --   CBC:  Lab 10/22/12 0645 10/21/12 0039 10/20/12 0948  WBC 4.8 6.1 7.6  NEUTROABS -- -- --  HGB 10.5* 9.6* 11.7*  HCT 33.5* 29.9* 36.6  MCV 88.2 86.7 87.6  PLT 141* 144* 164   Cardiac Enzymes:  Lab 10/21/12 0039 10/20/12 1815 10/20/12 1302  CKTOTAL -- -- --  CKMB -- -- --  CKMBINDEX -- -- --  TROPONINI 0.38* 0.37* <0.30   BNP: BNP (last 3 results)  Basename 10/16/12 0836  PROBNP 9826.0*      Signed:  Conley Canal [facility] [phone] 10/24/2012, 10:59 AM

## 2012-10-23 NOTE — Progress Notes (Signed)
SNF bed selected at [facility]- she is for d/c tomorrow per PA- daughter and SNF advised- will f/u tomorrow for d/c.   Reece Levy, MSW, Theresia Majors [phone]

## 2012-10-23 NOTE — Progress Notes (Signed)
[facility] PROGRESS NOTE  [name] RUE:[id number] DOB: 22-Mar-1922 DOA: 10/16/2012 PCP: [name], MD   Assessment/Plan:  Principal Problem:  *Acute hypoxic respiratory failure  -Secondary to necrotizing HCAP with hemoptysis -on admission-required BIPAP support  -titrate of O2 as tolerated. Currently requiring 3 liters at rest.  Was able to ambulate 150 feet today on 6L at 97%  Active Problems:  Healthcare-associated pneumonia- necrotizing  -Afebrile, leukocytosis has resolved  -D/c IV antibiotics and started oral levaquin 10/30.  Plan to d/c Ab after 11/1 dose. -sputum c/s-not diagnostic  -blood culture on 10/24-no growth   New EKG changes  -Flipped her T waves in inf-lateral leads on 10/28-she was having burning chest pain and vomiting during this time  -see notes from 10/28 - chest pain resolved. -cardiology plan for medical management only   NSTEMI  -Significantly elevated troponin 10/24 - 10/25. (1.8)  -more likely demand ischemia secondary to pneumonia -likely has underlying CAD  -cardiology recommending conservative treatment  -on ASA,Plavix, Metoprolol and Statins  -Echo-10/24-shows moderate hypokinesis in ant wall and apex and diastolic dysfunction.   Vomiting  -not sure if this was related to NSTEMI -big issue 10/28. Kept NPO overnight and given IVF  -resolved 10/29. Will advance to D3 diet as tolerated on 10/30.  -carafate PRN  -stop Tramadol  -Abd Xray-no significant changes (10/29)  -supportive care and follow clinically   CHF  -both systolic and diastolic  -EF per Echo-40-45 %  -Became overloaded with increased JVD 10/30.  Received 20 mg IV lasix.  Sleeps sitting up in chair. -Dr. [name] Hashimoto advised against scheduled lasix as she is so frail and would be easily over diuresed.  Hemoptysis  -Minimal red streaked sputum in suction tube. Resolving. -2/2 to PNA-c/w Antibiotics  -Spirometry   Anemia  -Hbg climbing (10.5 on 10/30) -Uncertain  etiology. Will likely be G+ due to recent hemoptysis.  Jonne Ply, Plavix, SCDs. Off of lovenox due to concern for bleeding.   Right Shoulder Pain  -exam shows no swelling or overlying erythema  -suspect OA  -Xray shoulder-no significant abnormalities  -better with Tylenol - patient has no complaints of pain with continued tylenol and heating pad.   Peripheral arterial disease  -c/w ASA, plavix   CKD  -stage 3. Creatinine at baseline (approx 1.5)  -monitor lytes periodically   Dementia  -stable  -c/w Aricept   Anxiety  -some anxiety 2/2 to right shoulder pain  -scheduled Xanax   DVT Prophylaxis:  Prophylactic Lovenox   Code Status: DNR Family Communication: Spoke with daughter Chip Boer today. Regarding d/c to SNF [facility] home) on 11/1 Disposition Plan:  d/c to SNF [facility] home) on 11/1  Consultants:  Cardiology  HPI/Subjective: My breathing is so shallow.  I feel I have to sit up.  Objective: Filed Vitals:   10/23/12 0520 10/23/12 1056 10/23/12 1100 10/23/12 1342  BP: 145/66 153/76  146/66  Pulse: 76 79  73  Temp: 97.4 F (36.3 C)   98.1 F (36.7 C)  TempSrc: Oral   Oral  Resp: 20   20  Height:      Weight:      SpO2: 100%  97% 96%    Intake/Output Summary (Last 24 hours) at 10/23/12 1431 Last data filed at 10/23/12 1300  Gross per 24 hour  Intake    600 ml  Output    700 ml  Net   -100 ml   Filed Weights   10/20/12 0414 10/21/12 0446 10/22/12 0557  Weight: 48.671  kg (107 lb 4.8 oz) 50.984 kg (112 lb 6.4 oz) 47.9 kg (105 lb 9.6 oz)    Exam:   General:  A&O, extremely pleasant.  Sitting up in bed.  Cardiovascular: RRR no M/R/G  Respiratory: Shallow breaths, decreased air movement, but no frank c/w/r  Abdomen: thin soft, nt, nd, +bs  Data Reviewed: Basic Metabolic Panel:  Lab 10/22/12 6578 10/21/12 0039 10/20/12 0610 10/18/12 0530 10/17/12 1747  NA 135 137 133* 138 133*  K 3.9 4.2 5.7* 4.3 4.5  CL 103 105 100 108 102  CO2 23 23 24 23 20     GLUCOSE 86 95 115* 113* 153*  BUN 29* 34* 29* 28* 30*  CREATININE 1.40* 1.50* 1.32* 1.57* 1.50*  CALCIUM 9.0 8.5 9.4 8.6 8.9  MG -- -- -- -- --  PHOS -- -- -- -- --   CBC:  Lab 10/22/12 0645 10/21/12 0039 10/20/12 0948 10/17/12 0540  WBC 4.8 6.1 7.6 9.1  NEUTROABS -- -- -- --  HGB 10.5* 9.6* 11.7* 10.6*  HCT 33.5* 29.9* 36.6 33.4*  MCV 88.2 86.7 87.6 87.9  PLT 141* 144* 164 152   Cardiac Enzymes:  Lab 10/21/12 0039 10/20/12 1815 10/20/12 1302 10/17/12 0021 10/16/12 1830 10/16/12 1824 10/16/12 1452  CKTOTAL -- -- -- 93 117 -- 94  CKMB -- -- -- 6.8* 8.8* -- 9.0*  CKMBINDEX -- -- -- -- -- -- --  TROPONINI 0.38* 0.37* <0.30 1.87* -- 1.04* --   BNP (last 3 results)  Basename 10/16/12 0836  PROBNP 9826.0*    Recent Results (from the past 240 hour(s))  CULTURE, BLOOD (ROUTINE X 2)     Status: Normal   Collection Time   10/16/12  9:40 AM      Component Value Range Status Comment   Specimen Description BLOOD LEFT RADIAL   Final    Special Requests BOTTLES DRAWN AEROBIC ONLY [facility]   Final    Culture  Setup Time 10/16/2012 15:37   Final    Culture NO GROWTH 5 DAYS   Final    Report Status 10/22/2012 FINAL   Final   CULTURE, BLOOD (ROUTINE X 2)     Status: Normal   Collection Time   10/16/12  9:50 AM      Component Value Range Status Comment   Specimen Description BLOOD ARM LEFT   Final    Special Requests     Final    Value: BOTTLES DRAWN AEROBIC AND ANAEROBIC 10CC AER 3CC ANA   Culture  Setup Time 10/16/2012 15:37   Final    Culture NO GROWTH 5 DAYS   Final    Report Status 10/22/2012 FINAL   Final   MRSA PCR SCREENING     Status: Normal   Collection Time   10/16/12  1:55 PM      Component Value Range Status Comment   MRSA by PCR NEGATIVE  NEGATIVE Final   URINE CULTURE     Status: Normal   Collection Time   10/16/12  4:32 PM      Component Value Range Status Comment   Specimen Description URINE, CLEAN CATCH   Final    Special Requests Normal   Final    Culture   Setup Time 10/16/2012 17:23   Final    Colony Count NO GROWTH   Final    Culture NO GROWTH   Final    Report Status 10/17/2012 FINAL   Final   CULTURE, EXPECTORATED SPUTUM-ASSESSMENT  Status: Normal   Collection Time   10/17/12  6:32 AM      Component Value Range Status Comment   Specimen Description SPUTUM   Final    Special Requests Normal   Final    Sputum evaluation     Final    Value: MICROSCOPIC FINDINGS SUGGEST THAT THIS SPECIMEN IS NOT REPRESENTATIVE OF LOWER RESPIRATORY SECRETIONS. PLEASE RECOLLECT.     Gram Stain Report Called to,Read Back By and Verified With: B BRADT,RN AT 0802 10/17/12 BY K BARR   Report Status 10/17/2012 FINAL   Final      Studies: No results found.  Scheduled Meds:   . acetaminophen  1,000 mg Oral TID  . albuterol  2 puff Inhalation TID  . ALPRAZolam  0.25 mg Oral BID  . aspirin  81 mg Oral Daily  . cholecalciferol  1,000 Units Oral Daily  . clopidogrel  75 mg Oral Q breakfast  . donepezil  5 mg Oral QHS  . feeding supplement  237 mL Oral BID BM  . Fluticasone-Salmeterol  1 puff Inhalation Q12H  . furosemide  20 mg Intravenous Once  . guaiFENesin  600 mg Oral BID  . ipratropium  0.5 mg Nebulization Q6H  . isosorbide mononitrate  30 mg Oral Daily  . levofloxacin  500 mg Oral Q48H  . metoprolol tartrate  25 mg Oral BID  . mirtazapine  7.5 mg Oral QHS  . montelukast  10 mg Oral q morning - 10a  . pantoprazole  40 mg Oral BID AC  . simvastatin  10 mg Oral q1800  . sodium chloride  2 spray Nasal q morning - 10a  . tetrahydrozoline  1 drop Both Eyes QID  . DISCONTD: feeding supplement  237 mL Oral BID BM   Continuous Infusions:   Principal Problem:  *Acute hypoxic respiratory failure  Active Problems:  Peripheral arterial disease  Healthcare-associated pneumonia  Elevated troponin  Hemoptysis  NSTEMI, initial episode of care  Systolic and diastolic CHF, acute on chronic    Time spent: 30 min.    Stephani Police  [facility] Pager [phone]. If 8PM-8AM, please contact night-coverage at [email or web address], password [facility] 10/23/2012, 2:31 PM  LOS: 7 days

## 2012-10-23 NOTE — Progress Notes (Signed)
Addendum  Patient seen and examined, chart and data base reviewed.  I agree with the above assessment and plan.  For full details please see Mrs. Algis Downs PA. Note.  Acute Resp Failure, Necrotizing PNA and type 2 NSTEMI.  Likely for discharge in AM to SNF.   Clint Lipps, MD [facility] Pager: [phone] 10/23/2012, 4:52 PM

## 2012-10-23 NOTE — Progress Notes (Signed)
PROGRESS NOTE  Subjective:   [name] is an 78 yo with necrotizing pneumonia.  We were consulted when she had + Troponin levels and ECG changes.  She has not had any further cardiac issues. No chest pain.    She is not a candidate for further cardiology evaluation.  Objective:    Vital Signs:   Temp:  [97.2 F (36.2 C)-98.8 F (37.1 C)] 97.4 F (36.3 C) (10/31 0520) Pulse Rate:  [74-84] 76  (10/31 0520) Resp:  [18-34] 20  (10/31 0520) BP: (122-145)/(50-75) 145/66 mmHg (10/31 0520) SpO2:  [91 %-100 %] 100 % (10/31 0520)  Last BM Date: 10/22/12   24-hour weight change: Weight change:   Weight trends: Filed Weights   10/20/12 0414 10/21/12 0446 10/22/12 0557  Weight: 107 lb 4.8 oz (48.671 kg) 112 lb 6.4 oz (50.984 kg) 105 lb 9.6 oz (47.9 kg)    Intake/Output:  10/30 0701 - 10/31 0700 In: 240 [P.O.:240] Out: 500 [Urine:500]     Physical Exam: BP 145/66  Pulse 76  Temp 97.4 F (36.3 C) (Oral)  Resp 20  Ht 5' (1.524 m)  Wt 105 lb 9.6 oz (47.9 kg)  BMI 20.62 kg/m2  SpO2 100%  General: Vital signs reviewed and noted.   Head: Normocephalic, atraumatic.  Eyes: conjunctivae/corneas clear.  EOM's intact.   Throat: normal  Neck: Supple. Normal carotids. No JVD  Lungs:  Rales, rhonchi left base  Heart: Regular rate,  With normal  S1 S2. No murmurs, gallops or rubs  Abdomen:  Soft, non-tender, non-distended with normoactive bowel sounds. No hepatomegaly. No rebound/guarding. No abdominal masses.  Extremities: Distal pedal pulses are 2+ .  No edema.    Neurologic: A&O X3, CN II - XII are grossly intact. Generally weak.  Psych: Responds to questions appropriately with normal affect.    Labs: BMET:  Basename 10/22/12 0645 10/21/12 0039  NA 135 137  K 3.9 4.2  CL 103 105  CO2 23 23  GLUCOSE 86 95  BUN 29* 34*  CREATININE 1.40* 1.50*  CALCIUM 9.0 8.5  MG -- --  PHOS -- --    Liver function tests: No results found for this basename:  AST:2,ALT:2,ALKPHOS:2,BILITOT:2,PROT:2,ALBUMIN:2 in the last 72 hours No results found for this basename: LIPASE:2,AMYLASE:2 in the last 72 hours  CBC:  Basename 10/22/12 0645 10/21/12 0039  WBC 4.8 6.1  NEUTROABS -- --  HGB 10.5* 9.6*  HCT 33.5* 29.9*  MCV 88.2 86.7  PLT 141* 144*    Cardiac Enzymes:  Basename 10/21/12 0039 10/20/12 1815 10/20/12 1302  CKTOTAL -- -- --  CKMB -- -- --  TROPONINI 0.38* 0.37* <0.30    Coagulation Studies: No results found for this basename: LABPROT:5,INR:5 in the last 72 hours  Other: No components found with this basename: POCBNP:3 No results found for this basename: DDIMER in the last 72 hours No results found for this basename: HGBA1C in the last 72 hours No results found for this basename: CHOL,HDL,LDLCALC,TRIG,CHOLHDL in the last 72 hours No results found for this basename: TSH,T4TOTAL,FREET3,T3FREE,THYROIDAB in the last 72 hours No results found for this basename: VITAMINB12,FOLATE,FERRITIN,TIBC,IRON,RETICCTPCT in the last 72 hours   Tele:  Oct. 31-- NSR  Medications:    Infusions:    Scheduled Medications:    . acetaminophen  1,000 mg Oral TID  . albuterol  2 puff Inhalation TID  . ALPRAZolam  0.25 mg Oral BID  . aspirin  81 mg Oral Daily  . cholecalciferol  1,000 Units  Oral Daily  . clopidogrel  75 mg Oral Q breakfast  . donepezil  5 mg Oral QHS  . feeding supplement  237 mL Oral BID BM  . Fluticasone-Salmeterol  1 puff Inhalation Q12H  . furosemide  20 mg Intravenous Once  . guaiFENesin  600 mg Oral BID  . isosorbide mononitrate  30 mg Oral Daily  . levofloxacin  500 mg Oral Q48H  . metoprolol tartrate  25 mg Oral BID  . mirtazapine  7.5 mg Oral QHS  . montelukast  10 mg Oral q morning - 10a  . pantoprazole  40 mg Oral BID AC  . simvastatin  10 mg Oral q1800  . sodium chloride  2 spray Nasal q morning - 10a  . tetrahydrozoline  1 drop Both Eyes QID  . DISCONTD: imipenem-cilastatin  250 mg Intravenous Q12H  .  DISCONTD: metoCLOPramide (REGLAN) injection  5 mg Intravenous Q8H  . DISCONTD: pantoprazole (PROTONIX) IV  40 mg Intravenous Q12H  . DISCONTD: sucralfate  1 g Oral TID WC & HS  . DISCONTD: vancomycin  500 mg Intravenous Q48H    Assessment/ Plan:    Acute hypoxic respiratory failure  (10/16/2012) Due to pneumonia, plans per int. Medicine team  Elevated troponin (10/16/2012) Likely due to stress from pneumonia.  She is not a candidate for invasive work up given her frail condition.  Systolic and diastolic CHF, acute on chronic (10/17/2012) She has mild - moderate LV dysfunction.  Continue medical therapy.  We will sign off. Call for questions.  Disposition: per medicine team. Length of Stay: 7  [name], Allison Hageman., MD, [facility] 10/23/2012, 7:59 AM Office [phone] Pager [phone]

## 2012-10-24 DIAGNOSIS — K222 Esophageal obstruction: Secondary | ICD-10-CM

## 2012-10-24 LAB — BASIC METABOLIC PANEL
CO2: 32 mEq/L (ref 19–32)
Chloride: 103 mEq/L (ref 96–112)
Glucose, Bld: 92 mg/dL (ref 70–99)
Potassium: 3.6 mEq/L (ref 3.5–5.1)
Sodium: 140 mEq/L (ref 135–145)

## 2012-10-24 MED ORDER — NITROGLYCERIN 0.4 MG SL SUBL: 0.4000 mg | SUBLINGUAL_TABLET | SUBLINGUAL | Status: DC | PRN

## 2012-10-24 MED ORDER — ALBUTEROL SULFATE (5 MG/ML) 0.5% IN NEBU: 2.5000 mg | INHALATION_SOLUTION | Freq: Four times a day (QID) | RESPIRATORY_TRACT | Status: DC | PRN

## 2012-10-24 MED ORDER — NYSTATIN 100000 UNIT/ML MT SUSP: 500000.0000 [IU] | Freq: Four times a day (QID) | OROMUCOSAL | Status: DC

## 2012-10-24 MED ORDER — LEVOFLOXACIN 500 MG PO TABS: 500.0000 mg | ORAL_TABLET | ORAL | Status: DC

## 2012-10-24 MED ORDER — ENSURE COMPLETE PO LIQD: 237.0000 mL | Freq: Two times a day (BID) | ORAL | Status: DC

## 2012-10-24 MED ORDER — PANTOPRAZOLE SODIUM 40 MG PO TBEC: 40.0000 mg | DELAYED_RELEASE_TABLET | Freq: Every day | ORAL | Status: DC

## 2012-10-24 MED ORDER — FUROSEMIDE 20 MG PO TABS: 20.0000 mg | ORAL_TABLET | Freq: Every day | ORAL | Status: DC | PRN

## 2012-10-24 MED ORDER — TETRAHYDROZOLINE HCL 0.05 % OP SOLN: 1.0000 [drp] | Freq: Four times a day (QID) | OPHTHALMIC | Status: DC

## 2012-10-24 MED ORDER — SUCRALFATE 1 GM/10ML PO SUSP: 1.0000 g | Freq: Three times a day (TID) | ORAL | Status: DC | PRN

## 2012-10-24 MED ORDER — GUAIFENESIN ER 600 MG PO TB12: 1200.0000 mg | ORAL_TABLET | Freq: Two times a day (BID) | ORAL | Status: DC

## 2012-10-24 MED ORDER — FLUCONAZOLE 100 MG PO TABS
100.0000 mg | ORAL_TABLET | Freq: Every day | ORAL | Status: DC
  Administered 2012-10-24: 100 mg via ORAL
  Filled 2012-10-24: qty 1

## 2012-10-24 MED ORDER — ISOSORBIDE MONONITRATE ER 30 MG PO TB24: 30.0000 mg | ORAL_TABLET | Freq: Every day | ORAL | Status: DC

## 2012-10-24 MED ORDER — ALPRAZOLAM 0.25 MG PO TABS: 0.2500 mg | ORAL_TABLET | Freq: Two times a day (BID) | ORAL | Status: DC | PRN

## 2012-10-24 NOTE — Progress Notes (Signed)
Physical Therapy Treatment Patient Details Name: [name] MRN: [id number] DOB: 1922/02/27 Today's Date: 10/24/2012 Time: 4098-1191 PT Time Calculation (min): 25 min  PT Assessment / Plan / Recommendation Comments on Treatment Session  Pt with NSTEMI, PNA, resp failure who continues progression. Pt maintaining 95% on 6L with long hall ambulation with increased distance and 93% at rest on 4L. Pt in better spirits today and states she is ready for SNF to regain strength and function. Will continue to follow.     Follow Up Recommendations  Supervision/Assistance - 24 hour;Post acute inpatient     Does the patient have the potential to tolerate intense rehabilitation  No, Recommend SNF  Barriers to Discharge        Equipment Recommendations  None recommended by PT    Recommendations for Other Services    Frequency     Plan Discharge plan needs to be updated    Precautions / Restrictions Precautions Precautions: Fall Precaution Comments: oxygen   Pertinent Vitals/Pain Chronic sore right shoulder. Pt denied putting heating pad on until after breakfast    Mobility  Bed Mobility Bed Mobility: Not assessed Details for Bed Mobility Assistance: pt on BSC on arrival Transfers Transfers: Sit to Stand;Stand to Sit Sit to Stand: 4: Min guard;From chair/3-in-1;With armrests Stand to Sit: 4: Min guard;To chair/3-in-1;With armrests Details for Transfer Assistance: x 3 trials from recliner and BSC with cueing for hand placement on armrests and to fully back to surface before trying to sit as well as keeping RW with her until fully to surface Ambulation/Gait Ambulation/Gait Assistance: 4: Min guard Ambulation Distance (Feet): 250 Feet Assistive device: Rolling walker Ambulation/Gait Assistance Details: cueing for posture and position in RW Gait Pattern: Step-through pattern;Decreased stride length;Trunk flexed Gait velocity: decreased Stairs: No    Exercises General Exercises - Lower  Extremity Long Arc Quad: AROM;Both;20 reps;Seated Hip Flexion/Marching: AROM;Both;20 reps;Seated   PT Diagnosis:    PT Problem List:   PT Treatment Interventions:     PT Goals Acute Rehab PT Goals PT Goal: Sit to Stand - Progress: Progressing toward goal PT Goal: Stand to Sit - Progress: Progressing toward goal PT Goal: Ambulate - Progress: Progressing toward goal  Visit Information  Last PT Received On: 10/24/12 Assistance Needed: +1    Subjective Data  Subjective: "I guess I'm going to that nursing home today"   Cognition  Overall Cognitive Status: History of cognitive impairments - at baseline Arousal/Alertness: Awake/alert Orientation Level: Appears intact for tasks assessed Behavior During Session: [facility] for tasks performed    Balance  Static Standing Balance Static Standing - Balance Support: No upper extremity supported Static Standing - Level of Assistance: 5: Stand by assistance Static Standing - Comment/# of Minutes: 4 min at sink while fixing hair  End of Session PT - End of Session Activity Tolerance: Patient tolerated treatment well Patient left: in chair;with call bell/phone within reach Nurse Communication: Mobility status   GP     Toney Sang Beth 10/24/2012, 8:52 AM Delaney Meigs, PT [phone]

## 2012-10-24 NOTE — Progress Notes (Signed)
Patient for d/c today to SNF bed at [facility]. Daughter and patient agreeable to this plan- plan transfer via EMS. Reece Levy, MSW, Theres[facility] Majors [phone]

## 2012-10-24 NOTE — Progress Notes (Signed)
Nsg Discharge Note  Admit Date:  10/16/2012 Discharge date: 10/24/2012   [name] to be D/C'd Nursing Home per MD order.  AVS completed.  Copy for chart, and copy for patient signed, and dated. Patient/caregiver able to verbalize understanding.  Discharge Medication:  [name], [name]  Home Medication Instructions NFA:[id number]   Printed on:10/24/12 1421  Medication Information                    Fluticasone-Salmeterol (ADVAIR) 100-50 MCG/DOSE AEPB Inhale 1 puff into the lungs every 12 (twelve) hours.           metoprolol tartrate (LOPRESSOR) 25 MG tablet Take 25 mg by mouth 2 (two) times daily.           Melatonin 1 MG TABS Take 1 mg by mouth at bedtime.           sodium chloride (OCEAN) 0.65 % SOLN nasal spray Place 2 sprays into the nose every morning. May keep at bedside.           clopidogrel (PLAVIX) 75 MG tablet Take 75 mg by mouth daily.           aspirin 81 MG chewable tablet Chew 81 mg by mouth daily.           Cholecalciferol (VITAMIN D) 1000 UNITS capsule Take 1,000 Units by mouth daily.           donepezil (ARICEPT) 5 MG tablet Take 5 mg by mouth at bedtime.           mirtazapine (REMERON) 15 MG tablet Take 7.5 mg by mouth at bedtime.           acetaminophen (MAPAP) 325 MG tablet Take 650 mg by mouth every 6 (six) hours as needed. For pain           albuterol (PROVENTIL HFA;VENTOLIN HFA) 108 (90 BASE) MCG/ACT inhaler Inhale 2 puffs into the lungs every 4 (four) hours as needed. Wheezing/shortness of breath.           ranitidine (ZANTAC) 300 MG tablet Take 300 mg by mouth 2 (two) times daily.           Carboxymethylcellul-Glycerin 0.5-0.9 % SOLN Place 1 drop into both eyes 4 (four) times daily as needed. For dry eyes           montelukast (SINGULAIR) 10 MG tablet Take 10 mg by mouth every morning.           benzocaine (ORABASE-B) 20 % PSTE Use as directed 1 application in the mouth or throat 4 (four) times daily as needed. For pain or discomfort          albuterol (PROVENTIL) (5 MG/ML) 0.5% nebulizer solution Take 0.5 mLs (2.5 mg total) by nebulization every 6 (six) hours as needed for wheezing or shortness of breath.           feeding supplement (ENSURE COMPLETE) LIQD Take 237 mLs by mouth 2 (two) times daily between meals.           isosorbide mononitrate (IMDUR) 30 MG 24 hr tablet Take 1 tablet (30 mg total) by mouth daily.           levofloxacin (LEVAQUIN) 500 MG tablet Take 1 tablet (500 mg total) by mouth every other day.           nitroGLYCERIN (NITROSTAT) 0.4 MG SL tablet Place 1 tablet (0.4 mg total) under the tongue every 5 (five) minutes x 3 doses as needed  for chest pain.           pantoprazole (PROTONIX) 40 MG tablet Take 1 tablet (40 mg total) by mouth daily before breakfast.           sucralfate (CARAFATE) 1 GM/10ML suspension Take 10 mLs (1 g total) by mouth 3 (three) times daily as needed (indigestion).           tetrahydrozoline 0.05 % ophthalmic solution Place 1 drop into both eyes 4 (four) times daily.           guaiFENesin (MUCINEX) 600 MG 12 hr tablet Take 2 tablets (1,200 mg total) by mouth 2 (two) times daily.           nystatin (MYCOSTATIN) 100000 UNIT/ML suspension Take 5 mLs (500,000 Units total) by mouth 4 (four) times daily.           furosemide (LASIX) 20 MG tablet Take 1 tablet (20 mg total) by mouth daily as needed (shortness of breath or increased JVD. Be careful not to over diurese).           ALPRAZolam (XANAX) 0.25 MG tablet Take 1 tablet (0.25 mg total) by mouth 2 (two) times daily as needed. For anxiety             Discharge Assessment: Filed Vitals:   10/24/12 0537  BP: 137/63  Pulse: 70  Temp: 97.4 F (36.3 C)  Resp: 20   Sacral stage I using barrier cream. IV catheter discontinued intact. Site without signs and symptoms of complications - no redness or edema noted at insertion site, patient denies c/o pain - only slight tenderness at site.  Dressing with slight pressure  applied.  D/c Instructions-Education: Discharge instructions given to patient/family with verbalized understanding. D/c education completed with patient/family including follow up instructions, medication list, d/c activities limitations if indicated, with other d/c instructions as indicated by MD - patient able to verbalize understanding, all questions fully answered. Patient instructed to return to ED, call 911, or call MD for any changes in condition.  Patient escorted via WC, and D/C home via private auto.  [name] Consuella Lose, RN 10/24/2012 2:21 PM

## 2012-10-24 NOTE — Discharge Summary (Signed)
Addendum  Patient seen and examined, chart and data base reviewed.  I agree with the above assessment and discharge plan.  For full details please see Mrs. Algis Downs PA. Note.  Necrotizing CAP, type 2 NSTEMI and acute respiratory failure. Improved to SNF today.   Clint Lipps, MD [facility] Pager: [phone] 10/24/2012, 2:07 PM

## 2012-11-05 ENCOUNTER — Ambulatory Visit
Admission: RE | Admit: 2012-11-05 | Discharge: 2012-11-05 | Disposition: A | Discharge status: Home / Self Care | Payer: Medicare Other | Source: Ambulatory Visit | Attending: Geriatric Medicine | Admitting: Geriatric Medicine

## 2012-11-05 ENCOUNTER — Other Ambulatory Visit: Payer: Self-pay | Admitting: Geriatric Medicine

## 2012-11-05 DIAGNOSIS — R0600 Dyspnea, unspecified: Secondary | ICD-10-CM

## 2012-11-05 DIAGNOSIS — R0602 Shortness of breath: Secondary | ICD-10-CM

## 2012-11-05 MED ORDER — IOHEXOL 350 MG/ML SOLN: 75.0000 mL | Freq: Once | INTRAVENOUS | Status: DC | PRN

## 2012-11-05 MED ORDER — IOHEXOL 350 MG/ML SOLN
50.0000 mL | Freq: Once | INTRAVENOUS | Status: AC | PRN
  Administered 2012-11-05: 50 mL via INTRAVENOUS

## 2012-11-06 ENCOUNTER — Ambulatory Visit (INDEPENDENT_AMBULATORY_CARE_PROVIDER_SITE_OTHER): Payer: Medicare Other | Admitting: Adult Health

## 2012-11-06 ENCOUNTER — Telehealth: Payer: Self-pay | Admitting: Cardiovascular Disease

## 2012-11-06 ENCOUNTER — Encounter: Payer: Self-pay | Admitting: Adult Health

## 2012-11-06 VITALS — BP 118/64 | HR 86 | Temp 97.4°F | Ht 60.0 in | Wt 93.8 lb

## 2012-11-06 DIAGNOSIS — J189 Pneumonia, unspecified organism: Secondary | ICD-10-CM

## 2012-11-06 DIAGNOSIS — I5043 Acute on chronic combined systolic (congestive) and diastolic (congestive) heart failure: Secondary | ICD-10-CM

## 2012-11-06 DIAGNOSIS — J449 Chronic obstructive pulmonary disease, unspecified: Secondary | ICD-10-CM

## 2012-11-06 DIAGNOSIS — I509 Heart failure, unspecified: Secondary | ICD-10-CM

## 2012-11-06 NOTE — Telephone Encounter (Signed)
Pt has a new pt hospital appt with nahser on 12-4, pt was at [facility] office at pulmonary and they requested her to be seen sooner, having anxiety, has chf, pls call dtr vickie

## 2012-11-06 NOTE — Assessment & Plan Note (Signed)
Cont on Advair  Will need spirometry vs PFT in future

## 2012-11-06 NOTE — Patient Instructions (Addendum)
Continue on Advair 1 puff Twice daily   Continue on continuous oxygen .  follow up Dr. Craige Cotta  In 3 weeks with chest xray  We are referring you to cardiology for sooner follow up next week.  Please contact office for sooner follow up if symptoms do not improve or worsen or seek emergency care

## 2012-11-06 NOTE — Progress Notes (Signed)
Subjective:    Patient ID: [name], female    DOB: 06-10-1922, 78 y.o.   MRN: [id number]  HPI 78 yo female former smoker ,  (quit 1998)  [facility] pt seen for initial pulmonary consult during hospital admission 10/24-11/12/2011 for acute resp failure due to necrotizing HCAP, COPD , NSTEMI and Decompensated CHF   11/06/2012 Post Hospital follow up  Patient was admitted October 24 through 10/24/2012 for acute hypoxic respiratory failure secondary to a necrotizing hospital-acquired pneumonia with hemoptysis. She was treated initially with BiPAP support and transitioned over to nasal cannula. She was treated with aggressive pulmonary hygiene, IV antibiotics, appeared. She did undergo a speech therapy evaluation, and did not find any symptoms of aspiration. However, was placed on a dysphagia 3 diet with aspiration precautions. She was seen by cardiology. Related to significantly elevated. Troponins and felt to have a non-STEMI versus intermediate ischemia, secondary to pneumonia. 2-D echo showed moderate hypokinesis in the anterior wall and apex. diastolic dysfunction.  Ejection fraction was 40-45% During her hospitalization. She did have acute chest pain on October 28 and had new EKG changes. Cardiology recommended conservative therapy, and she was not considered any invasive procedure candidate She did require IV diuresis to 2 fluid overload  Since discharge. The patient does feel that she is slowly improving with decreased cough, congestion, however, has had several of, what she describes as attacks of shortness of breath, diaphoresis, and weakness. She was seen by nursing home physician and started on Lasix and was informed that she had an elevated heart failur marker . Records from the nursing home aren't available at today's visit. This was conveyed by patient and her daughter She denies any fever or purulent sputum, edema, vomiting She reports that prior to her hospitalization. She was not on oxygen  therapy, however, was discharged on oxygen, and wears a, continuously, nail. She does have a history of COPD, however, do not have any previous records, a pulmonary function test  CT scan was done yesterday showing no pulmonary emboli, marked improvement in left lower lobe pneumonia with residual air space consolidation in the lingula and left lower lobe and small bilateral pleural effusions         Review of Systems Constitutional:   No  weight loss, night sweats,  Fevers, chills,  +fatigue, or  lassitude.  HEENT:   No headaches,  Difficulty swallowing,  Tooth/dental problems, or  Sore throat,                No sneezing, itching, ear ache, nasal congestion, post nasal drip,   CV:  No chest pain,  Orthopnea, PND, swelling in lower extremities, anasarca, dizziness, palpitations, syncope.   GI  No heartburn, indigestion, abdominal pain, nausea, vomiting, diarrhea, change in bowel habits, loss of appetite, bloody stools.   Resp:   No excess mucus, no productive cough,  No non-productive cough,  No coughing up of blood.  No change in color of mucus.  No wheezing.  No chest wall deformity  Skin: no rash or lesions.  GU: no dysuria, change in color of urine, no urgency or frequency.  No flank pain, no hematuria   MS:  No joint pain or swelling.  No decreased range of motion.     Psych:  No change in mood or affect. No depression or anxiety.           Objective:   Physical Exam GEN: A/Ox3; pleasant , NAD frail elderly   HEENT:  [place]/AT,  EACs-clear, TMs-wnl, NOSE-clear, THROAT-clear, no lesions, no postnasal drip or exudate noted.   NECK:  Supple w/ fair ROM; no JVD; normal carotid impulses w/o bruits; no thyromegaly or nodules palpated; no lymphadenopathy.  RESP  Diminished BS in bases no accessory muscle use, no dullness to percussion  CARD:  RRR, no m/r/g  , no peripheral edema, pulses intact, no cyanosis or clubbing.  GI:   Soft & nt; nml bowel sounds; no organomegaly or  masses detected.  Musco: Warm bil, no deformities or joint swelling noted.   Neuro: alert, no focal deficits noted.    Skin: Warm, no lesions or rashes         Assessment & Plan:

## 2012-11-06 NOTE — Telephone Encounter (Signed)
App given sooner with Norma Fredrickson np.

## 2012-11-06 NOTE — Assessment & Plan Note (Signed)
Recent admission w/ decompensation during critical illness  Refer to cardiology for follow up next week  Sounds as if she has been having more difficulty , cont on lasix as directed

## 2012-11-06 NOTE — Assessment & Plan Note (Signed)
Resolving HCAP clinically improving w/ CT chest showing improvement  No further abx at this time.  follow up in 3-4 weeks with cxr

## 2012-11-07 ENCOUNTER — Other Ambulatory Visit: Payer: Medicare Other

## 2012-11-07 NOTE — Progress Notes (Signed)
Reviewed and agree with assessment/plan. 

## 2012-11-07 NOTE — Addendum Note (Signed)
Addended by: Boone Master E on: 11/07/2012 09:05 AM   Modules accepted: Orders

## 2012-11-11 ENCOUNTER — Encounter: Payer: Medicare Other | Admitting: Nurse Practitioner

## 2012-11-12 ENCOUNTER — Ambulatory Visit (INDEPENDENT_AMBULATORY_CARE_PROVIDER_SITE_OTHER): Payer: Medicare Other | Admitting: Nurse Practitioner

## 2012-11-12 ENCOUNTER — Encounter: Payer: Self-pay | Admitting: Nurse Practitioner

## 2012-11-12 VITALS — BP 120/64 | HR 68 | Ht 60.0 in | Wt 92.8 lb

## 2012-11-12 DIAGNOSIS — R0600 Dyspnea, unspecified: Secondary | ICD-10-CM

## 2012-11-12 DIAGNOSIS — R0609 Other forms of dyspnea: Secondary | ICD-10-CM

## 2012-11-12 LAB — CBC WITH DIFFERENTIAL/PLATELET
Basophils Absolute: 0 10*3/uL (ref 0.0–0.1)
Basophils Relative: 0.5 % (ref 0.0–3.0)
Eosinophils Absolute: 0.2 10*3/uL (ref 0.0–0.7)
Eosinophils Relative: 2.5 % (ref 0.0–5.0)
HCT: 33.6 % — ABNORMAL LOW (ref 36.0–46.0)
Hemoglobin: 10.8 g/dL — ABNORMAL LOW (ref 12.0–15.0)
Lymphocytes Relative: 12 % (ref 12.0–46.0)
Lymphs Abs: 1.2 10*3/uL (ref 0.7–4.0)
MCHC: 32 g/dL (ref 30.0–36.0)
MCV: 84.8 fl (ref 78.0–100.0)
Monocytes Absolute: 0.5 10*3/uL (ref 0.1–1.0)
Monocytes Relative: 5.2 % (ref 3.0–12.0)
Neutro Abs: 8 10*3/uL — ABNORMAL HIGH (ref 1.4–7.7)
Neutrophils Relative %: 79.8 % — ABNORMAL HIGH (ref 43.0–77.0)
Platelets: 239 10*3/uL (ref 150.0–400.0)
RBC: 3.96 Mil/uL (ref 3.87–5.11)
RDW: 14.7 % — ABNORMAL HIGH (ref 11.5–14.6)
WBC: 10 10*3/uL (ref 4.5–10.5)

## 2012-11-12 LAB — BRAIN NATRIURETIC PEPTIDE: Pro B Natriuretic peptide (BNP): 1697 pg/mL — ABNORMAL HIGH (ref 0.0–100.0)

## 2012-11-12 LAB — BASIC METABOLIC PANEL
BUN: 34 mg/dL — ABNORMAL HIGH (ref 6–23)
CO2: 36 mEq/L — ABNORMAL HIGH (ref 19–32)
Calcium: 9.4 mg/dL (ref 8.4–10.5)
Chloride: 96 mEq/L (ref 96–112)
Creatinine, Ser: 1.8 mg/dL — ABNORMAL HIGH (ref 0.4–1.2)
GFR: 27.85 mL/min — ABNORMAL LOW (ref >60.00)
Glucose, Bld: 82 mg/dL (ref 70–99)
Potassium: 4.1 mEq/L (ref 3.5–5.1)
Sodium: 141 mEq/L (ref 135–145)

## 2012-11-12 NOTE — Progress Notes (Signed)
[name] Date of Birth: 1922/07/31 Medical Record #[id number]  History of Present Illness: Ms. Corporan is seen today for a post hospital visit. She is seen for Dr. [name] Hashimoto. She is an 78 year old female with multiple medical issues. Has no known CAD but is presumed to have CAD. Has had extensive PAD. Previously cared for at [facility] in [facility]. Now at [facility]). Other issues include esophageal stricture, HTN, and PVD. She is a DNR.   She was recently admitted with respiratory failure, pneumonia, CHF, elevated troponin, CKD, dementia and anxiety. Was treated with antibiotics, diuresis, oxygen. Has a sacral decubitus as well. Discharged to SNF at the [facility].  She comes in here today. She is here with her daughter and son. They provide a lot of the history. They think she is doing a little better. Was having more "panic attacks" once she got to the facility. She says she would get very hot, dizzy, then cold and clammy. No actual syncope. Also with more right shoulder pain. Thought this might be her heart. Given more Tylenol and had her Xanax increased. Symptoms seem to be "simmering down" now. On continuous oxygen. Has plans to go to [facility] at the end of the month. No actual chest pain. Swelling has improved. Not able to have daily weights. No falls. Seen pulmonary already. Had follow up CT showing improvement in her pneumonia. Medicines are clarified with the facility. Not on Imdur. Not on antibiotics.   Current Outpatient Prescriptions on File Prior to Visit  Medication Sig Dispense Refill  . acetaminophen (MAPAP) 325 MG tablet Take 650 mg by mouth every 6 (six) hours as needed. For pain      . albuterol (PROVENTIL HFA;VENTOLIN HFA) 108 (90 BASE) MCG/ACT inhaler Inhale 2 puffs into the lungs every 4 (four) hours as needed. Wheezing/shortness of breath.      Marland Kitchen albuterol (PROVENTIL) (5 MG/ML) 0.5% nebulizer solution Take 0.5 mLs (2.5 mg total) by nebulization every  6 (six) hours as needed for wheezing or shortness of breath.  20 mL  0  . ALPRAZolam (XANAX) 0.25 MG tablet Take 1 tablet (0.25 mg total) by mouth 2 (two) times daily as needed. For anxiety  30 tablet  0  . aspirin 81 MG chewable tablet Chew 81 mg by mouth daily.      . Carboxymethylcellul-Glycerin 0.5-0.9 % SOLN Place 1 drop into both eyes 4 (four) times daily as needed. For dry eyes       . Cholecalciferol (VITAMIN D) 1000 UNITS capsule Take 1,000 Units by mouth daily.      . clopidogrel (PLAVIX) 75 MG tablet Take 75 mg by mouth daily.      Marland Kitchen donepezil (ARICEPT) 5 MG tablet Take 5 mg by mouth at bedtime.      . feeding supplement (ENSURE COMPLETE) LIQD Take 237 mLs by mouth 2 (two) times daily between meals.      . Fluticasone-Salmeterol (ADVAIR) 100-50 MCG/DOSE AEPB Inhale 1 puff into the lungs every 12 (twelve) hours.      Marland Kitchen guaiFENesin (MUCINEX) 600 MG 12 hr tablet Take 2 tablets (1,200 mg total) by mouth 2 (two) times daily.      . Melatonin 1 MG TABS Take 1 mg by mouth at bedtime.      . metoprolol tartrate (LOPRESSOR) 25 MG tablet Take 25 mg by mouth 2 (two) times daily.      . montelukast (SINGULAIR) 10 MG tablet Take 10 mg by mouth  every morning.      . ranitidine (ZANTAC) 300 MG tablet Take 300 mg by mouth 2 (two) times daily.      . sodium chloride (OCEAN) 0.65 % SOLN nasal spray Place 2 sprays into the nose every morning. May keep at bedside.      Marland Kitchen tetrahydrozoline 0.05 % ophthalmic solution Place 1 drop into both eyes 4 (four) times daily.  15 mL  0  . [DISCONTINUED] furosemide (LASIX) 20 MG tablet Take 1 tablet (20 mg total) by mouth daily as needed (shortness of breath or increased JVD. Be careful not to over diurese).  30 tablet  0    Allergies  Allergen Reactions  . Altace (Ramipril) Other (See Comments)    Reaction unknown  . Ambien (Zolpidem Tartrate) Other (See Comments)    Reaction unknown  . Codeine Other (See Comments)    Reaction unknown  . Demerol (Meperidine)  Other (See Comments)    Reaction unknown  . Penicillins Other (See Comments)    Reaction unknown  . Sulfa Antibiotics Other (See Comments)    Reaction unknown  . Versed (Midazolam) Other (See Comments)    Reaction unknown    Past Medical History  Diagnosis Date  . Hypertension   . Peripheral arterial disease   . Cognitive disorder   . Esophageal stricture     prior dilatation in June 2013  . Osteoarthritis   . Anxiety   . UTI (lower urinary tract infection) June 2013  . COPD (chronic obstructive pulmonary disease)     acute respiratory failure November 2013 with PNA  . NSTEMI (non-ST elevated myocardial infarction) November 2013    felt to be due to demand ischemia; managed conservatively; not a candidate for invasive testing due to general frailty  . IBS (irritable bowel syndrome)   . Acute combined systolic and diastolic HF (heart failure)     EF is 40 to 45% per echo November 2013  . Anemia     uncertain etiology  . Dementia   . Anxiety   . Sacral decubitus ulcer     Past Surgical History  Procedure Date  . Femoral bypass   . Esophagogastroduodenoscopy 06/21/2012    Procedure: ESOPHAGOGASTRODUODENOSCOPY (EGD);  Surgeon: Theda Belfast, MD;  Location: [facility];  Service: Endoscopy;  Laterality: N/A;    History  Smoking status  . Former Smoker  . Quit date: 12/24/1996  Smokeless tobacco  . Not on file    Comment: per dtr, pt quit tobacco in 1998.    History  Alcohol Use No    Family History  Problem Relation Age of Onset  . Coronary artery disease Father   . Coronary artery disease Mother   . Hypertension Mother   . Breast cancer Maternal Grandmother   . Breast cancer Maternal Aunt     Review of Systems: The review of systems is per the HPI.  All other systems were reviewed and are negative.  Physical Exam: BP 120/64  Pulse 68  Ht 5' (1.524 m)  Wt 92 lb 12.8 oz (42.094 kg)  BMI 18.12 kg/m2 Patient is very pleasant and in no acute distress.  She is quite hard of hearing. She looks pretty frail. She is in a wheelchair. She is quite thin as well. Skin is warm and dry. Color is normal.  HEENT is unremarkable. Normocephalic/atraumatic. PERRL. Sclera are nonicteric. Neck is supple. No masses. No JVD. Lungs are fairly clear. Cardiac exam shows a regular rate and rhythm. Abdomen  is soft. Extremities are without edema. Gait is not tested. ROM appears intact. No gross neurologic deficits noted.  LABORATORY DATA: Pending  Echo Study Conclusions from October 2013  - Left ventricle: The cavity size was normal. Wall thickness was increased in a pattern of moderate LVH. Systolic function was mildly to moderately reduced. The estimated ejection fraction was in the range of 40% to 45%. Moderate hypokinesis of the mid-distalanteroseptal, anterior, and apical myocardium. Doppler parameters are consistent with abnormal left ventricular relaxation (grade 1 diastolic dysfunction). - Left atrium: The atrium was mildly dilated. - Pulmonary arteries: Systolic pressure was moderately increased.   Lab Results  Component Value Date   WBC 4.8 10/22/2012   HGB 10.5* 10/22/2012   HCT 33.5* 10/22/2012   PLT 141* 10/22/2012   GLUCOSE 92 10/24/2012   CHOL 98 10/17/2012   TRIG 73 10/17/2012   HDL 56 10/17/2012   LDLCALC 27 10/17/2012   ALT 11 10/16/2012   AST 24 10/16/2012   NA 140 10/24/2012   K 3.6 10/24/2012   CL 103 10/24/2012   CREATININE 1.18* 10/24/2012   BUN 22 10/24/2012   CO2 32 10/24/2012   TSH 1.520 10/16/2012   INR 1.06 10/16/2012   Ct Angio Chest Pe W/cm &/or Wo Cm  11/05/2012  *RADIOLOGY REPORT*  Clinical Data: Ex-smoker with shortness of breath.  Question pulmonary embolus.  Shallow breathing.  Syncope.  CT ANGIOGRAPHY CHEST  Technique:  Multidetector CT imaging of the chest using the standard protocol during bolus administration of intravenous contrast. Multiplanar reconstructed images including MIPs were obtained and reviewed to  evaluate the vascular anatomy.  Contrast:  OMNIPAQUE IOHEXOL 350 MG/ML SOLN, 50mL OMNIPAQUE IOHEXOL 350 MG/ML SOLN  Comparison: 10/16/2012.  Findings: Evaluation of the basilar pulmonary arteries is limited by respiratory motion.  No definite pulmonary embolus.  Mediastinal lymph nodes measure up to approximately 11 mm in the precarinal station, as before.  Mild bihilar lymphoid tissue.  No axillary adenopathy.  Heart is enlarged.  No pericardial effusion. Esophagus is dilated throughout its course.  Distal esophageal wall is thickened, which can be seen with gastroesophageal reflux disease.  Small bilateral pleural effusions.  Biapical pleural parenchymal scarring.  Emphysema.  Minimal compressive atelectasis in the lower lobes.  Marked interval improvement and airspace consolidation in the left lower lobe.  There may be residual areas of mild air space disease in the lingula and left lower lobe (images 61 and 62).  A nodule in the left lower lobe is difficult to completely exclude. Airway is unremarkable.  Incidental imaging of the upper abdomen shows reflux of contrast into the IVC and hepatic veins.  No worrisome lytic or sclerotic lesions.  IMPRESSION:  1.  Evaluation for basilar pulmonary emboli is limited by respiratory motion.  No definite pulmonary embolus. 2.  Marked interval improvement in left lower lobe pneumonia with residual foci of airspace consolidation in the lingula and left lower lobe.  A left lower lobe nodule is difficult to completely exclude.  If further evaluation is desired, CT chest without contrast could be performed in 4-6 weeks. 3.  Small bilateral pleural effusions, increased from 10/16/2012.   Original Report Authenticated By: Leanna Battles, M.D.    Assessment / Plan: 1. Recent admission for respiratory failure/pneumonia/systolic and diastolic heart failure - now with continuous oxygen. CT showed some improvement. She will need to be managed medically. I have left her on her low  dose BB and lasix. See Dr. [name] Hashimoto back in 4 weeks. Facility  not able to do daily weights and adjusting of Lasix as needed.   2. ? Panic attacks - hard to say what this is. May be arrhythmic. In any event, would continue with conservative management. She is not a candidate for invasive testing/procedures.   3. NSTEMI - felt to be due to demand ischemia. Has PAD and could certainly have CAD. I do not think her right shoulder pain is cardiac related. It has responded to Tylenol.   4. Advanced age - her options are limited. Would focus on comfort and safety at this time.

## 2012-11-12 NOTE — Patient Instructions (Addendum)
We need to check some labs today (BMET, CBC, BNP)  Continue with your current medicines  See Dr. [name] Hashimoto in 4 weeks  Call the [facility] office at [phone] if you have any questions, problems or concerns.

## 2012-11-25 ENCOUNTER — Encounter: Payer: Medicare Other | Admitting: Cardiovascular Disease

## 2012-11-27 ENCOUNTER — Ambulatory Visit (INDEPENDENT_AMBULATORY_CARE_PROVIDER_SITE_OTHER): Payer: Medicare Other | Admitting: Cardiovascular Disease

## 2012-11-27 ENCOUNTER — Encounter: Payer: Self-pay | Admitting: Cardiovascular Disease

## 2012-11-27 VITALS — BP 110/70 | HR 80 | Ht 60.0 in | Wt 92.0 lb

## 2012-11-27 DIAGNOSIS — J449 Chronic obstructive pulmonary disease, unspecified: Secondary | ICD-10-CM

## 2012-11-27 DIAGNOSIS — R0602 Shortness of breath: Secondary | ICD-10-CM

## 2012-11-27 DIAGNOSIS — I5043 Acute on chronic combined systolic (congestive) and diastolic (congestive) heart failure: Secondary | ICD-10-CM

## 2012-11-27 DIAGNOSIS — I509 Heart failure, unspecified: Secondary | ICD-10-CM

## 2012-11-27 LAB — BASIC METABOLIC PANEL
BUN: 22 mg/dL (ref 6–23)
CO2: 33 mEq/L — ABNORMAL HIGH (ref 19–32)
Chloride: 99 mEq/L (ref 96–112)
Creatinine, Ser: 1.4 mg/dL — ABNORMAL HIGH (ref 0.4–1.2)

## 2012-11-27 NOTE — Assessment & Plan Note (Signed)
[name] is doing quite a bit better. She had an elevated troponin level when she presented to the hospital with acute respiratory distress.  She's not having any angina. She has an ejection fraction of 40-45%.  We will be very conservative with her. I do not think that she needs any additional medications. I do not perceive any stress testing or other diagnostic procedures. We will concentrate on her safety and her comfort.  We'll get a basic metabolic profile and BNP today to followup from her hospitalization.  I will see her again in 6 months if the patient and her daughter desire. Otherwise she can follow up with her general medical Dr.

## 2012-11-27 NOTE — Patient Instructions (Signed)
Your physician wants you to follow-up in: 6 months  You will receive a reminder letter in the mail two months in advance. If you don't receive a letter, please call our office to schedule the follow-up appointment.  Your physician recommends that you return for lab work in: today bmet/ bnp

## 2012-11-27 NOTE — Progress Notes (Signed)
[name] Date of Birth  18-Jul-1922       [facility]    [place] [address]  [address] [place], [place]  [address]   [place], [place]  [address] [id number]     [phone]   Fax  [phone]    Fax [phone]  Problem List: 1. CAD ( presumed) 2. PAD 3. Acute respiratory failure, acute necrotizing pneumonia 4.COPD 5. CHF - EF 40-45% 6.  CKD - stage 3 7. Dementia  History of Present Illness:  [name] is an 78 yo who was recently in the hospital for pneumonia, mild CHF, COPD, and dementia.  She came with her daughter this am.  She is feeling much better.  Denies any chest pain or worsening of her dyspnea.    Current Outpatient Prescriptions on File Prior to Visit  Medication Sig Dispense Refill  . acetaminophen (MAPAP) 325 MG tablet Take 650 mg by mouth every 6 (six) hours as needed. For pain      . albuterol (PROVENTIL HFA;VENTOLIN HFA) 108 (90 BASE) MCG/ACT inhaler Inhale 2 puffs into the lungs every 4 (four) hours as needed. Wheezing/shortness of breath.      Marland Kitchen albuterol (PROVENTIL) (5 MG/ML) 0.5% nebulizer solution Take 0.5 mLs (2.5 mg total) by nebulization every 6 (six) hours as needed for wheezing or shortness of breath.  20 mL  0  . ALPRAZolam (XANAX) 0.25 MG tablet Take 1 tablet (0.25 mg total) by mouth 2 (two) times daily as needed. For anxiety  30 tablet  0  . aspirin 81 MG chewable tablet Chew 81 mg by mouth daily.      . Carboxymethylcellul-Glycerin 0.5-0.9 % SOLN Place 1 drop into both eyes 4 (four) times daily as needed. For dry eyes       . Cholecalciferol (VITAMIN D) 1000 UNITS capsule Take 1,000 Units by mouth daily.      . clopidogrel (PLAVIX) 75 MG tablet Take 75 mg by mouth daily.      Marland Kitchen donepezil (ARICEPT) 5 MG tablet Take 5 mg by mouth at bedtime.      . feeding supplement (ENSURE COMPLETE) LIQD Take 237 mLs by mouth 2 (two) times daily between meals.      . Fluticasone-Salmeterol (ADVAIR) 100-50  MCG/DOSE AEPB Inhale 1 puff into the lungs every 12 (twelve) hours.      . furosemide (LASIX) 20 MG tablet Take 20 mg by mouth daily.       . Melatonin 1 MG TABS Take 1 mg by mouth at bedtime.      . metoprolol tartrate (LOPRESSOR) 25 MG tablet Take 25 mg by mouth 2 (two) times daily.      . montelukast (SINGULAIR) 10 MG tablet Take 10 mg by mouth every morning.      . ranitidine (ZANTAC) 300 MG tablet Take 300 mg by mouth 2 (two) times daily.      . sodium chloride (OCEAN) 0.65 % SOLN nasal spray Place 2 sprays into the nose every morning. May keep at bedside.      Marland Kitchen tetrahydrozoline 0.05 % ophthalmic solution Place 1 drop into both eyes 4 (four) times daily.  15 mL  0    Allergies  Allergen Reactions  . Altace (Ramipril) Other (See Comments)    Reaction unknown  . Ambien (Zolpidem Tartrate) Other (See Comments)    Reaction unknown  . Codeine Other (See Comments)    Reaction unknown  . Demerol (  Meperidine) Other (See Comments)    Reaction unknown  . Penicillins Other (See Comments)    Reaction unknown  . Sulfa Antibiotics Other (See Comments)    Reaction unknown  . Versed (Midazolam) Other (See Comments)    Reaction unknown    Past Medical History  Diagnosis Date  . Hypertension   . Peripheral arterial disease   . Cognitive disorder   . Esophageal stricture     prior dilatation in June 2013  . Osteoarthritis   . Anxiety   . UTI (lower urinary tract infection) June 2013  . COPD (chronic obstructive pulmonary disease)     acute respiratory failure November 2013 with PNA  . NSTEMI (non-ST elevated myocardial infarction) November 2013    felt to be due to demand ischemia; managed conservatively; not a candidate for invasive testing due to general frailty  . IBS (irritable bowel syndrome)   . Acute combined systolic and diastolic HF (heart failure)     EF is 40 to 45% per echo November 2013  . Anemia     uncertain etiology  . Dementia   . Anxiety   . Sacral decubitus  ulcer     Past Surgical History  Procedure Date  . Femoral bypass   . Esophagogastroduodenoscopy 06/21/2012    Procedure: ESOPHAGOGASTRODUODENOSCOPY (EGD);  Surgeon: Theda Belfast, MD;  Location: [facility];  Service: Endoscopy;  Laterality: N/A;    History  Smoking status  . Former Smoker  . Quit date: 12/24/1996  Smokeless tobacco  . Not on file    Comment: per dtr, pt quit tobacco in 1998.    History  Alcohol Use No    Family History  Problem Relation Age of Onset  . Coronary artery disease Father   . Coronary artery disease Mother   . Hypertension Mother   . Breast cancer Maternal Grandmother   . Breast cancer Maternal Aunt     Reviw of Systems:  Reviewed in the HPI.  All other systems are negative.  Physical Exam: Blood pressure 110/70, pulse 80, height 5' (1.524 m), weight 92 lb (41.731 kg), SpO2 93.00%. General: Well developed, well nourished, in no acute distress.  Elderly thin female  Head: Normocephalic, atraumatic, sclera non-icteric, mucus membranes are moist,   Neck: Supple. Carotids are 2 + without bruits. No JVD   Lungs: Clear , slightly decreased breath sounds left base  Heart: RR  Abdomen: Soft, non-tender, non-distended with normal bowel sounds.  Msk:  Strength and tone are normal   Extremities: No clubbing or cyanosis. No edema.  Distal pedal pulses are 2+ and equal    Neuro: CN II - XII intact.  Alert and oriented X 3.   Psych:  Normal   ECG:  Assessment / Plan:

## 2012-11-28 NOTE — Assessment & Plan Note (Signed)
She is very stable at this point.      Echo showed: Study Conclusions  - Left ventricle: The cavity size was normal. Wall thickness was increased in a pattern of moderate LVH. Systolic function was mildly to moderately reduced. The estimated ejection fraction was in the range of 40% to 45%. Moderate hypokinesis of the mid-distalanteroseptal, anterior, and apical myocardium. Doppler parameters are consistent with abnormal left ventricular relaxation (grade 1 diastolic dysfunction). - Left atrium: The atrium was mildly dilated. - Pulmonary arteries: Systolic pressure was moderately Increased. - ~ 62 mm Hg.  She seems to be comfortable.  I had a long discussion with her daughter and explained that we would concentrate on comfort and safety.  She is not a good candidate for any invasive procedures.   Her BP and HR are well controlled.  I will see her in 6 months.  We will decide at that point if she needs to follow up with [place] vs. Her primary medical doctor.

## 2012-12-02 ENCOUNTER — Encounter: Payer: Self-pay | Admitting: Pulmonary Disease

## 2012-12-02 ENCOUNTER — Ambulatory Visit (INDEPENDENT_AMBULATORY_CARE_PROVIDER_SITE_OTHER): Payer: Medicare Other | Admitting: Pulmonary Disease

## 2012-12-02 VITALS — BP 90/62 | HR 83 | Temp 97.7°F | Ht 60.0 in | Wt 91.8 lb

## 2012-12-02 DIAGNOSIS — J9611 Chronic respiratory failure with hypoxia: Secondary | ICD-10-CM

## 2012-12-02 DIAGNOSIS — J439 Emphysema, unspecified: Secondary | ICD-10-CM

## 2012-12-02 DIAGNOSIS — J438 Other emphysema: Secondary | ICD-10-CM

## 2012-12-02 DIAGNOSIS — J9610 Chronic respiratory failure, unspecified whether with hypoxia or hypercapnia: Secondary | ICD-10-CM

## 2012-12-02 DIAGNOSIS — R0902 Hypoxemia: Secondary | ICD-10-CM

## 2012-12-02 NOTE — Patient Instructions (Signed)
Stop singulair Continue advair one puff twice per day, and rinse mouth after each use Albuterol one vial nebulized up to 4 times per day as needed for cough, wheeze, or chest congestion Albuterol inhaler two puffs up to 4 times per day as needed for cough, wheeze, or chest congestion Use 2 liters oxygen with exertion and sleep.  You don't need to use oxygen at rest Follow up in 6 months

## 2012-12-02 NOTE — Assessment & Plan Note (Signed)
She maintained her oxygen on room air at rest.  She still has oxygen desaturation with exertion.  She needs to continue 2 liters oxygen with exertion and sleep.

## 2012-12-02 NOTE — Progress Notes (Signed)
Chief Complaint  Patient presents with  . Follow-up    Pt reports that she gets pretty winded at night even when wearing o2--daughter would like to know how long she needs to be on nebs and if singulair is needed bc seems like it is causing runny nose    History of Present Illness: [name] is a 78 y.o. female with COPD/emphysema, and chronic hypoxic respiratory failure.  She was in hospital in October for pneumonia.  She resides in [facility].  She is accompanied by her daughter.  Her breathing has been doing better.  She is still using 3 liters oxygen 24/7.  She uses advair twice per day.  She is not sure how much this helps.  She uses singulair daily.  She was started on this for nasal congestion, but it hasn't helped.  She has been using albuterol nebulizer three times per day.  She does this because she was told to, and not because of symptoms.  She gets winded with exertion.  She has occasional dry cough.  She denies wheeze, chest pain, hemoptysis, or fever.   TESTS: 10/16/12 Echo >> mod LVH, EF 40 to 45%, grade 1 diastolic dysfx 11/05/12 CT chest >> no PE, small b/l effusions, emphysema, basilar ATX 12/02/12 Spirometry >> FEV1 0.65 (47%), FEV1% 40  Past Medical History  Diagnosis Date  . Hypertension   . Peripheral arterial disease   . Cognitive disorder   . Esophageal stricture     prior dilatation in June 2013  . Osteoarthritis   . Anxiety   . UTI (lower urinary tract infection) June 2013  . COPD (chronic obstructive pulmonary disease)     acute respiratory failure November 2013 with PNA  . NSTEMI (non-ST elevated myocardial infarction) November 2013    felt to be due to demand ischemia; managed conservatively; not a candidate for invasive testing due to general frailty  . IBS (irritable bowel syndrome)   . Acute combined systolic and diastolic HF (heart failure)     EF is 40 to 45% per echo November 2013  . Anemia     uncertain etiology  . Dementia    . Anxiety   . Sacral decubitus ulcer     Past Surgical History  Procedure Date  . Femoral bypass   . Esophagogastroduodenoscopy 06/21/2012    Procedure: ESOPHAGOGASTRODUODENOSCOPY (EGD);  Surgeon: Theda Belfast, MD;  Location: [facility];  Service: Endoscopy;  Laterality: N/A;    Outpatient Encounter Prescriptions as of 12/02/2012  Medication Sig Dispense Refill  . acetaminophen (MAPAP) 325 MG tablet Take 650 mg by mouth every 6 (six) hours as needed. For pain      . albuterol (PROVENTIL HFA;VENTOLIN HFA) 108 (90 BASE) MCG/ACT inhaler Inhale 2 puffs into the lungs every 4 (four) hours as needed. Wheezing/shortness of breath.      Marland Kitchen albuterol (PROVENTIL) (5 MG/ML) 0.5% nebulizer solution Take 0.5 mLs (2.5 mg total) by nebulization every 6 (six) hours as needed for wheezing or shortness of breath.  20 mL  0  . ALPRAZolam (XANAX) 0.25 MG tablet Take 0.5 mg by mouth 2 (two) times daily as needed. For anxiety      . Cholecalciferol (VITAMIN D) 1000 UNITS capsule Take 1,000 Units by mouth daily.      . clopidogrel (PLAVIX) 75 MG tablet Take 75 mg by mouth daily.      Marland Kitchen donepezil (ARICEPT) 5 MG tablet Take 5 mg by mouth at bedtime.      Marland Kitchen  feeding supplement (ENSURE COMPLETE) LIQD Take 237 mLs by mouth 2 (two) times daily between meals.      . Fluticasone-Salmeterol (ADVAIR) 100-50 MCG/DOSE AEPB Inhale 1 puff into the lungs every 12 (twelve) hours.      . furosemide (LASIX) 20 MG tablet Take 20 mg by mouth daily.       . Melatonin 1 MG TABS Take 1 mg by mouth at bedtime.      . metoprolol tartrate (LOPRESSOR) 25 MG tablet Take 25 mg by mouth 2 (two) times daily.      . mirtazapine (REMERON) 7.5 MG tablet Take 7.5 mg by mouth at bedtime.      . montelukast (SINGULAIR) 10 MG tablet Take 10 mg by mouth every morning.      . promethazine (PHENERGAN) 12.5 MG tablet Take 12.5 mg by mouth every 6 (six) hours as needed.      . ranitidine (ZANTAC) 300 MG tablet Take 150 mg by mouth daily.       .  sodium chloride (OCEAN) 0.65 % SOLN nasal spray Place 2 sprays into the nose every morning. May keep at bedside.      . traMADol (ULTRAM) 50 MG tablet Take 50 mg by mouth every 6 (six) hours as needed.      . [DISCONTINUED] ALPRAZolam (XANAX) 0.25 MG tablet Take 1 tablet (0.25 mg total) by mouth 2 (two) times daily as needed. For anxiety  30 tablet  0  . aspirin 81 MG chewable tablet Chew 81 mg by mouth daily.      . Carboxymethylcellul-Glycerin 0.5-0.9 % SOLN Place 1 drop into both eyes 4 (four) times daily as needed. For dry eyes       . tetrahydrozoline 0.05 % ophthalmic solution Place 1 drop into both eyes 4 (four) times daily.  15 mL  0    Allergies  Allergen Reactions  . Altace (Ramipril) Other (See Comments)    Reaction unknown  . Ambien (Zolpidem Tartrate) Other (See Comments)    Reaction unknown  . Codeine Other (See Comments)    Reaction unknown  . Demerol (Meperidine) Other (See Comments)    Reaction unknown  . Penicillins Other (See Comments)    Reaction unknown  . Sulfa Antibiotics Other (See Comments)    Reaction unknown  . Versed (Midazolam) Other (See Comments)    Reaction unknown    Physical Exam:  Filed Vitals:   12/02/12 1614  BP: 90/62  Pulse: 83  Temp: 97.7 F (36.5 C)  TempSrc: Oral  Height: 5' (1.524 m)  Weight: 91 lb 12.8 oz (41.64 kg)  SpO2: 98%    Current Encounter SPO2  12/02/12 1614 98%  11/27/12 0910 93%  11/06/12 1217 90%    Body mass index is 17.93 kg/(m^2).   Wt Readings from Last 2 Encounters:  12/02/12 91 lb 12.8 oz (41.64 kg)  11/27/12 92 lb (41.731 kg)    General - Thin, no distress, wearing oxygen ENT - No sinus tenderness, no oral exudate, no LAN Cardiac - s1s2 regular, no murmur Chest - Prolonged exhalation, decreased breath sounds, no wheeze Back - No focal tenderness Abd - Soft, non-tender Ext - No edema Neuro - Normal strength Skin - No rashes Psych - normal mood, and behavior   Assessment/Plan:  Coralyn Helling, MD [facility] Pager:  [phone] 12/02/2012, 4:21 PM

## 2012-12-02 NOTE — Assessment & Plan Note (Signed)
She has severe airflow obstruction on spirometry, and extensive changes of emphysema on CT chest.    She can continue advair.  Advised she can use albuterol as needed.  I see no role for singulair at this point, and have advised her to stop this.

## 2012-12-10 ENCOUNTER — Emergency Department (HOSPITAL_COMMUNITY): Payer: Medicare Other

## 2012-12-10 ENCOUNTER — Encounter (HOSPITAL_COMMUNITY): Payer: Self-pay

## 2012-12-10 ENCOUNTER — Inpatient Hospital Stay (HOSPITAL_COMMUNITY)
Admission: EM | Admit: 2012-12-10 | Discharge: 2012-12-13 | DRG: 291 | Disposition: A | Discharge status: Home / Self Care | Payer: Medicare Other | Attending: Internal Medicine | Admitting: Internal Medicine

## 2012-12-10 DIAGNOSIS — J9611 Chronic respiratory failure with hypoxia: Secondary | ICD-10-CM

## 2012-12-10 DIAGNOSIS — I428 Other cardiomyopathies: Secondary | ICD-10-CM | POA: Diagnosis present

## 2012-12-10 DIAGNOSIS — F411 Generalized anxiety disorder: Secondary | ICD-10-CM | POA: Diagnosis present

## 2012-12-10 DIAGNOSIS — D696 Thrombocytopenia, unspecified: Secondary | ICD-10-CM

## 2012-12-10 DIAGNOSIS — I5042 Chronic combined systolic (congestive) and diastolic (congestive) heart failure: Secondary | ICD-10-CM | POA: Diagnosis present

## 2012-12-10 DIAGNOSIS — I739 Peripheral vascular disease, unspecified: Secondary | ICD-10-CM

## 2012-12-10 DIAGNOSIS — Z66 Do not resuscitate: Secondary | ICD-10-CM | POA: Diagnosis present

## 2012-12-10 DIAGNOSIS — I369 Nonrheumatic tricuspid valve disorder, unspecified: Secondary | ICD-10-CM

## 2012-12-10 DIAGNOSIS — I214 Non-ST elevation (NSTEMI) myocardial infarction: Secondary | ICD-10-CM

## 2012-12-10 DIAGNOSIS — J441 Chronic obstructive pulmonary disease with (acute) exacerbation: Secondary | ICD-10-CM

## 2012-12-10 DIAGNOSIS — L89159 Pressure ulcer of sacral region, unspecified stage: Secondary | ICD-10-CM

## 2012-12-10 DIAGNOSIS — J9620 Acute and chronic respiratory failure, unspecified whether with hypoxia or hypercapnia: Secondary | ICD-10-CM | POA: Diagnosis present

## 2012-12-10 DIAGNOSIS — F0390 Unspecified dementia without behavioral disturbance: Secondary | ICD-10-CM | POA: Diagnosis present

## 2012-12-10 DIAGNOSIS — M1990 Unspecified osteoarthritis, unspecified site: Secondary | ICD-10-CM

## 2012-12-10 DIAGNOSIS — D638 Anemia in other chronic diseases classified elsewhere: Secondary | ICD-10-CM | POA: Diagnosis present

## 2012-12-10 DIAGNOSIS — I252 Old myocardial infarction: Secondary | ICD-10-CM

## 2012-12-10 DIAGNOSIS — N179 Acute kidney failure, unspecified: Secondary | ICD-10-CM | POA: Diagnosis present

## 2012-12-10 DIAGNOSIS — K222 Esophageal obstruction: Secondary | ICD-10-CM

## 2012-12-10 DIAGNOSIS — Z87440 Personal history of urinary (tract) infections: Secondary | ICD-10-CM

## 2012-12-10 DIAGNOSIS — F419 Anxiety disorder, unspecified: Secondary | ICD-10-CM

## 2012-12-10 DIAGNOSIS — J449 Chronic obstructive pulmonary disease, unspecified: Secondary | ICD-10-CM

## 2012-12-10 DIAGNOSIS — I5043 Acute on chronic combined systolic (congestive) and diastolic (congestive) heart failure: Principal | ICD-10-CM | POA: Diagnosis present

## 2012-12-10 DIAGNOSIS — I10 Essential (primary) hypertension: Secondary | ICD-10-CM | POA: Diagnosis present

## 2012-12-10 DIAGNOSIS — J439 Emphysema, unspecified: Secondary | ICD-10-CM

## 2012-12-10 DIAGNOSIS — R5381 Other malaise: Secondary | ICD-10-CM | POA: Diagnosis present

## 2012-12-10 DIAGNOSIS — Z87891 Personal history of nicotine dependence: Secondary | ICD-10-CM

## 2012-12-10 DIAGNOSIS — I509 Heart failure, unspecified: Secondary | ICD-10-CM

## 2012-12-10 LAB — CBC WITH DIFFERENTIAL/PLATELET
Basophils Absolute: 0 10*3/uL (ref 0.0–0.1)
Eosinophils Relative: 2 % (ref 0–5)
HCT: 31.6 % — ABNORMAL LOW (ref 36.0–46.0)
Hemoglobin: 9.8 g/dL — ABNORMAL LOW (ref 12.0–15.0)
Lymphocytes Relative: 13 % (ref 12–46)
MCV: 83.8 fL (ref 78.0–100.0)
Monocytes Absolute: 0.4 10*3/uL (ref 0.1–1.0)
Monocytes Relative: 7 % (ref 3–12)
Neutro Abs: 5 10*3/uL (ref 1.7–7.7)
RDW: 14.4 % (ref 11.5–15.5)
WBC: 6.4 10*3/uL (ref 4.0–10.5)

## 2012-12-10 LAB — COMPREHENSIVE METABOLIC PANEL
ALT: 24 U/L (ref 0–35)
CO2: 27 mEq/L (ref 19–32)
Calcium: 9.5 mg/dL (ref 8.4–10.5)
Creatinine, Ser: 1.15 mg/dL — ABNORMAL HIGH (ref 0.50–1.10)
GFR calc Af Amer: 48 mL/min — ABNORMAL LOW (ref >90)
GFR calc non Af Amer: 41 mL/min — ABNORMAL LOW (ref >90)
Glucose, Bld: 116 mg/dL — ABNORMAL HIGH (ref 70–99)
Sodium: 139 mEq/L (ref 135–145)
Total Bilirubin: 0.3 mg/dL (ref 0.3–1.2)

## 2012-12-10 LAB — URINALYSIS, MICROSCOPIC ONLY
Glucose, UA: NEGATIVE mg/dL
Ketones, ur: NEGATIVE mg/dL
Leukocytes, UA: NEGATIVE
Nitrite: NEGATIVE
Specific Gravity, Urine: 1.011 (ref 1.005–1.030)
Urine-Other: NONE SEEN
pH: 5.5 (ref 5.0–8.0)

## 2012-12-10 LAB — TROPONIN I
Troponin I: <0.3 ng/mL (ref <0.30)
Troponin I: <0.3 ng/mL (ref <0.30)

## 2012-12-10 MED ORDER — ACETAMINOPHEN 325 MG PO TABS
650.0000 mg | ORAL_TABLET | Freq: Three times a day (TID) | ORAL | Status: DC | PRN
  Filled 2012-12-10: qty 2

## 2012-12-10 MED ORDER — SODIUM CHLORIDE 0.9 % IJ SOLN: 3.0000 mL | INTRAMUSCULAR | Status: DC | PRN

## 2012-12-10 MED ORDER — ASPIRIN 81 MG PO CHEW
81.0000 mg | CHEWABLE_TABLET | Freq: Every day | ORAL | Status: DC
  Administered 2012-12-11 – 2012-12-13 (×3): 81 mg via ORAL
  Filled 2012-12-10 (×4): qty 1

## 2012-12-10 MED ORDER — FUROSEMIDE 10 MG/ML IJ SOLN
40.0000 mg | Freq: Two times a day (BID) | INTRAMUSCULAR | Status: DC
  Administered 2012-12-10 – 2012-12-11 (×3): 40 mg via INTRAVENOUS
  Filled 2012-12-10 (×7): qty 4

## 2012-12-10 MED ORDER — MELATONIN 1 MG PO TABS: 1.0000 mg | ORAL_TABLET | Freq: Every day | ORAL | Status: DC

## 2012-12-10 MED ORDER — POLYVINYL ALCOHOL 1.4 % OP SOLN
1.0000 [drp] | Freq: Four times a day (QID) | OPHTHALMIC | Status: DC | PRN
  Filled 2012-12-10: qty 15

## 2012-12-10 MED ORDER — ONDANSETRON HCL 4 MG/2ML IJ SOLN: 4.0000 mg | Freq: Four times a day (QID) | INTRAMUSCULAR | Status: DC | PRN

## 2012-12-10 MED ORDER — FUROSEMIDE 10 MG/ML IJ SOLN
60.0000 mg | Freq: Once | INTRAMUSCULAR | Status: AC
  Administered 2012-12-10: 60 mg via INTRAVENOUS
  Filled 2012-12-10: qty 8

## 2012-12-10 MED ORDER — ACETAMINOPHEN 325 MG PO TABS
650.0000 mg | ORAL_TABLET | ORAL | Status: DC | PRN
  Administered 2012-12-12: 650 mg via ORAL

## 2012-12-10 MED ORDER — ALBUTEROL SULFATE (5 MG/ML) 0.5% IN NEBU: 2.5000 mg | INHALATION_SOLUTION | RESPIRATORY_TRACT | Status: DC | PRN

## 2012-12-10 MED ORDER — MOMETASONE FURO-FORMOTEROL FUM 100-5 MCG/ACT IN AERO
2.0000 | INHALATION_SPRAY | Freq: Two times a day (BID) | RESPIRATORY_TRACT | Status: DC
  Administered 2012-12-10 – 2012-12-13 (×6): 2 via RESPIRATORY_TRACT
  Filled 2012-12-10: qty 8.8

## 2012-12-10 MED ORDER — CLOPIDOGREL BISULFATE 75 MG PO TABS
75.0000 mg | ORAL_TABLET | Freq: Every day | ORAL | Status: DC
  Administered 2012-12-11 – 2012-12-13 (×3): 75 mg via ORAL
  Filled 2012-12-10 (×4): qty 1

## 2012-12-10 MED ORDER — TRAMADOL HCL 50 MG PO TABS: 50.0000 mg | ORAL_TABLET | Freq: Four times a day (QID) | ORAL | Status: DC | PRN

## 2012-12-10 MED ORDER — SODIUM CHLORIDE 0.9 % IV SOLN: 250.0000 mL | INTRAVENOUS | Status: DC | PRN

## 2012-12-10 MED ORDER — DONEPEZIL HCL 5 MG PO TABS
5.0000 mg | ORAL_TABLET | Freq: Every day | ORAL | Status: DC
  Administered 2012-12-10 – 2012-12-12 (×3): 5 mg via ORAL
  Filled 2012-12-10 (×4): qty 1

## 2012-12-10 MED ORDER — FAMOTIDINE 20 MG PO TABS
20.0000 mg | ORAL_TABLET | Freq: Every day | ORAL | Status: DC
  Administered 2012-12-11 – 2012-12-13 (×3): 20 mg via ORAL
  Filled 2012-12-10 (×3): qty 1

## 2012-12-10 MED ORDER — ENOXAPARIN SODIUM 30 MG/0.3ML ~~LOC~~ SOLN
30.0000 mg | SUBCUTANEOUS | Status: DC
  Administered 2012-12-11 – 2012-12-12 (×2): 30 mg via SUBCUTANEOUS
  Filled 2012-12-10 (×4): qty 0.3

## 2012-12-10 MED ORDER — SALINE SPRAY 0.65 % NA SOLN
2.0000 | Freq: Every morning | NASAL | Status: DC
  Administered 2012-12-11 – 2012-12-13 (×3): 2 via NASAL
  Filled 2012-12-10: qty 44

## 2012-12-10 MED ORDER — ENSURE COMPLETE PO LIQD
237.0000 mL | Freq: Two times a day (BID) | ORAL | Status: DC
  Administered 2012-12-11 – 2012-12-13 (×5): 237 mL via ORAL
  Filled 2012-12-10: qty 237

## 2012-12-10 MED ORDER — NITROGLYCERIN 2 % TD OINT
0.5000 [in_us] | TOPICAL_OINTMENT | Freq: Four times a day (QID) | TRANSDERMAL | Status: DC
  Administered 2012-12-10 – 2012-12-13 (×12): 0.5 [in_us] via TOPICAL
  Filled 2012-12-10: qty 30

## 2012-12-10 MED ORDER — SODIUM CHLORIDE 0.9 % IJ SOLN
3.0000 mL | Freq: Two times a day (BID) | INTRAMUSCULAR | Status: DC
  Administered 2012-12-10 – 2012-12-12 (×5): 3 mL via INTRAVENOUS

## 2012-12-10 MED ORDER — ALBUTEROL SULFATE HFA 108 (90 BASE) MCG/ACT IN AERS
2.0000 | INHALATION_SPRAY | RESPIRATORY_TRACT | Status: DC | PRN
  Filled 2012-12-10: qty 6.7

## 2012-12-10 MED ORDER — METOPROLOL TARTRATE 25 MG PO TABS
25.0000 mg | ORAL_TABLET | Freq: Two times a day (BID) | ORAL | Status: DC
  Administered 2012-12-10 – 2012-12-13 (×6): 25 mg via ORAL
  Filled 2012-12-10 (×7): qty 1

## 2012-12-10 MED ORDER — ALPRAZOLAM 0.25 MG PO TABS
0.2500 mg | ORAL_TABLET | Freq: Two times a day (BID) | ORAL | Status: DC | PRN
  Administered 2012-12-10 – 2012-12-11 (×3): 0.25 mg via ORAL
  Filled 2012-12-10 (×3): qty 1

## 2012-12-10 MED ORDER — NITROGLYCERIN 0.4 MG SL SUBL
SUBLINGUAL_TABLET | SUBLINGUAL | Status: AC
  Administered 2012-12-10: 0.4 mg
  Filled 2012-12-10: qty 25

## 2012-12-10 MED ORDER — PROMETHAZINE HCL 25 MG PO TABS
12.5000 mg | ORAL_TABLET | Freq: Four times a day (QID) | ORAL | Status: DC | PRN
  Administered 2012-12-13 (×2): 12.5 mg via ORAL
  Filled 2012-12-10 (×2): qty 1

## 2012-12-10 MED ORDER — SODIUM CHLORIDE 0.9 % IV SOLN: INTRAVENOUS | Status: DC

## 2012-12-10 MED ORDER — PROMETHAZINE HCL 25 MG PO TABS: 12.5000 mg | ORAL_TABLET | Freq: Four times a day (QID) | ORAL | Status: DC | PRN

## 2012-12-10 MED ORDER — ACETAMINOPHEN 325 MG PO TABS
650.0000 mg | ORAL_TABLET | Freq: Once | ORAL | Status: AC
  Administered 2012-12-10: 650 mg via ORAL
  Filled 2012-12-10: qty 2

## 2012-12-10 NOTE — ED Provider Notes (Signed)
History     CSN: [id number]  Arrival date & time 12/10/12  1037   First MD Initiated Contact with Patient 12/10/12 1046      Chief Complaint  Patient presents with  . Shortness of Breath     HPI 78 year old female presents from assisted living with chief complaint shortness of breath.  History of congestive heart failure in the past.  Patient denied chest pain.  Has had some diaphoresis.  Patient's symptoms started after having a holiday meal which was seasoned and with excess salt. Past Medical History  Diagnosis Date  . Hypertension   . Peripheral arterial disease   . Cognitive disorder   . Esophageal stricture     prior dilatation in June 2013  . Osteoarthritis   . Anxiety   . UTI (lower urinary tract infection) June 2013  . COPD (chronic obstructive pulmonary disease)     acute respiratory failure November 2013 with PNA  . NSTEMI (non-ST elevated myocardial infarction) November 2013    felt to be due to demand ischemia; managed conservatively; not a candidate for invasive testing due to general frailty  . IBS (irritable bowel syndrome)   . Acute combined systolic and diastolic HF (heart failure)     EF is 40 to 45% per echo November 2013  . Anemia     uncertain etiology  . Dementia   . Anxiety   . Sacral decubitus ulcer     Past Surgical History  Procedure Date  . Femoral bypass   . Esophagogastroduodenoscopy 06/21/2012    Procedure: ESOPHAGOGASTRODUODENOSCOPY (EGD);  Surgeon: Theda Belfast, MD;  Location: [facility];  Service: Endoscopy;  Laterality: N/A;    Family History  Problem Relation Age of Onset  . Coronary artery disease Father   . Coronary artery disease Mother   . Hypertension Mother   . Breast cancer Maternal Grandmother   . Breast cancer Maternal Aunt     History  Substance Use Topics  . Smoking status: Former Smoker    Quit date: 12/24/1996  . Smokeless tobacco: Never Used     Comment: per dtr, pt quit tobacco in 1998.  Marland Kitchen Alcohol  Use: No    OB History    Grav Para Term Preterm Abortions TAB SAB Ect Mult Living                  Review of Systems Level V caveat (shortness of breath) Allergies  Altace; Ambien; Codeine; Demerol; Penicillins; Sulfa antibiotics; and Versed  Home Medications   Current Outpatient Rx  Name  Route  Sig  Dispense  Refill  . ACETAMINOPHEN 325 MG PO TABS   Oral   Take 650 mg by mouth 3 (three) times daily as needed. For pain         . ALBUTEROL SULFATE HFA 108 (90 BASE) MCG/ACT IN AERS   Inhalation   Inhale 2 puffs into the lungs every 4 (four) hours as needed. Wheezing/shortness of breath.         . ALBUTEROL SULFATE (2.5 MG/3ML) 0.083% IN NEBU   Nebulization   Take 2.5 mg by nebulization 3 (three) times daily.         Marland Kitchen ALPRAZOLAM 0.25 MG PO TABS   Oral   Take 0.25 mg by mouth 2 (two) times daily as needed. For anxiety         . ASPIRIN 81 MG PO CHEW   Oral   Chew 81 mg by mouth daily.         Marland Kitchen  CARBOXYMETHYLCELLUL-GLYCERIN 0.5-0.9 % OP SOLN   Both Eyes   Place 1 drop into both eyes 4 (four) times daily as needed. For dry eyes          . VITAMIN D 1000 UNITS PO CAPS   Oral   Take 1,000 Units by mouth daily.         Marland Kitchen CLOPIDOGREL BISULFATE 75 MG PO TABS   Oral   Take 75 mg by mouth daily.         . DONEPEZIL HCL 5 MG PO TABS   Oral   Take 5 mg by mouth at bedtime.         Nolon Nations COMPLETE PO LIQD   Oral   Take 237 mLs by mouth 2 (two) times daily between meals.           Patient prefers VANILLA   . FLUTICASONE-SALMETEROL 100-50 MCG/DOSE IN AEPB   Inhalation   Inhale 1 puff into the lungs every 12 (twelve) hours.         . FUROSEMIDE 20 MG PO TABS   Oral   Take 20 mg by mouth daily.          Marland Kitchen MELATONIN 1 MG PO TABS   Oral   Take 1 mg by mouth at bedtime.         Marland Kitchen METOPROLOL TARTRATE 25 MG PO TABS   Oral   Take 25 mg by mouth 2 (two) times daily.         Marland Kitchen PROMETHAZINE HCL 12.5 MG PO TABS   Oral   Take 12.5 mg by  mouth every 6 (six) hours as needed. For nausea/vomiting.         Marland Kitchen RANITIDINE HCL 150 MG PO TABS   Oral   Take 150 mg by mouth at bedtime.         Marland Kitchen SALINE 0.65 % NA SOLN   Nasal   Place 2 sprays into the nose every morning. May keep at bedside.         . TRAMADOL HCL 50 MG PO TABS   Oral   Take 50 mg by mouth every 6 (six) hours as needed. For pain.           BP 162/76  Pulse 83  Temp 97.7 F (36.5 C) (Oral)  Resp 20  SpO2 95%  Physical Exam  Nursing note and vitals reviewed. Constitutional: She is oriented to person, place, and time. She appears well-developed and well-nourished. No distress.  HENT:  Head: Normocephalic and atraumatic.  Eyes: Pupils are equal, round, and reactive to light.  Neck: Normal range of motion.  Cardiovascular: Normal rate and intact distal pulses.   Pulmonary/Chest: She is in respiratory distress. She has rales.  Abdominal: Normal appearance. She exhibits no distension. There is no tenderness. There is no rebound.  Musculoskeletal: Normal range of motion. She exhibits no edema.  Neurological: She is alert and oriented to person, place, and time. No cranial nerve deficit.  Skin: Skin is warm and dry. No rash noted.  Psychiatric: She has a normal mood and affect. Her behavior is normal.    ED Course  Procedures (including critical care time)   Date: 12/10/2012  Rate: 85  Rhythm: normal sinus rhythm  QRS Axis: normal  Intervals: normal  ST/T Wave abnormalities: T wave inversion which looks primarily old with some changes in lead 2  Conduction Disutrbances: LVH with secondary repolarization abnormality  Narrative Interpretation: Abnormal EKG  CRITICAL CARE Performed by: Nelia Shi   Total critical care time: 30 min  Critical care time was exclusive of separately billable procedures and treating other patients.  Critical care was necessary to treat or prevent imminent or life-threatening deterioration.  Critical  care was time spent personally by me on the following activities: development of treatment plan with patient and/or surrogate as well as nursing, discussions with consultants, evaluation of patient's response to treatment, examination of patient, obtaining history from patient or surrogate, ordering and performing treatments and interventions, ordering and review of laboratory studies, ordering and review of radiographic studies, pulse oximetry and re-evaluation of patient's condition.   Labs Reviewed  COMPREHENSIVE METABOLIC PANEL - Abnormal; Notable for the following:    Glucose, Bld 116 (*)     Creatinine, Ser 1.15 (*)     Albumin 2.9 (*)     GFR calc non Af Amer 41 (*)     GFR calc Af Amer 48 (*)     All other components within normal limits  PRO B NATRIURETIC PEPTIDE - Abnormal; Notable for the following:    Pro B Natriuretic peptide (BNP) 47088.0 (*)     All other components within normal limits  CBC WITH DIFFERENTIAL - Abnormal; Notable for the following:    RBC 3.77 (*)     Hemoglobin 9.8 (*)     HCT 31.6 (*)     All other components within normal limits  POCT I-STAT TROPONIN I   Dg Chest Portable 1 View  12/10/2012  *RADIOLOGY REPORT*  Clinical Data: Shortness of breath.  PORTABLE CHEST - 1 VIEW  Comparison: 10/20/2012.  CT scan from 11/05/2012  Findings: The cardiopericardial silhouette is enlarged. Interstitial markings are diffusely coarsened with chronic features.  There is some persistent airspace disease at the left base. Imaged bony structures of the thorax are intact. Telemetry leads overlie the chest.  IMPRESSION: Cardiomegaly with advanced underlying chronic interstitial lung disease.  Persistent retrocardiac left base airspace opacity.   Original Report Authenticated By: Kennith Center, M.D.      1. Congestive heart failure       MDM         Nelia Shi, MD 12/12/12 [phone]

## 2012-12-10 NOTE — ED Notes (Signed)
Pt attempting to use bedpan, increased work of breathing noted. sats dropped to 89% on 3L. Pt unable to speak complete sentences. Dr. Radford Pax notified and to bedside to assess pt.

## 2012-12-10 NOTE — ED Notes (Signed)
MD at bedside. 

## 2012-12-10 NOTE — ED Notes (Signed)
XR at bedside

## 2012-12-10 NOTE — Progress Notes (Signed)
  Echocardiogram 2D Echocardiogram has been performed.  [name] 12/10/2012, 3:53 PM

## 2012-12-10 NOTE — ED Notes (Signed)
BJY:[id number]<FA> Expected date:<BR> Expected time:<BR> Means of arrival:<BR> Comments:<BR> SOB

## 2012-12-10 NOTE — ED Notes (Signed)
Per EMS pt from [facility], pt c/o SOB, currently being tx for PNA; pt a/o x4, pt on 2l o2 at home had to increase to 4l in the past few days

## 2012-12-10 NOTE — Consult Note (Signed)
CARDIOLOGY CONSULT NOTE   Patient ID: [name] MRN: [id number] DOB/AGE: [birth year] 76 y.o.  Admit date: 12/10/2012  Primary Physician   Allison Jenny, MD Primary Cardiologist   [name] - saw her 12/5 Reason for Consultation   SOB  [name] is a 78 y.o. female with a history of CHF and NSTEMI. She was doing well on 12/10 when seen by Dr [name]. There was a holiday party at [facility] and she may have had accidental sodium indiscretion. She is not weighed daily. She has not had increased LE edema. She has had increased DOE and gets very SOB going to the bathroom. She also describes PND and possibly orthopnea. She gets "panic attacks" when she has episodic SOB, not associated with exertion and takes Xanax. She generally eats poorly and has no appetite. Her symptoms have gradually increased in severity over the last few days. Today, they were the worst they have been and she was brought to the ER, where she was given Lasix 60 mg IV. Currently, her breathing is improved. She had 1 panic attack in the ER, but it has resolved. She has had no chest pain. Her weight at office visit with Dr [name] 12/10 was 41.64 kg.   Past Medical History  Diagnosis Date  . Hypertension   . Peripheral arterial disease   . Cognitive disorder   . Esophageal stricture     prior dilatation in June 2013  . Osteoarthritis   . Anxiety   . UTI (lower urinary tract infection) June 2013  . COPD (chronic obstructive pulmonary disease)     acute respiratory failure November 2013 with PNA  . NSTEMI (non-ST elevated myocardial infarction) November 2013    felt to be due to demand ischemia; managed conservatively; not a candidate for invasive testing due to general frailty  . IBS (irritable bowel syndrome)   . Acute combined systolic and diastolic HF (heart failure)     EF is 40 to 45% per echo November 2013  . Anemia     uncertain etiology  . Dementia   . Anxiety   . Sacral decubitus ulcer      Past  Surgical History  Procedure Date  . Femoral bypass   . Esophagogastroduodenoscopy 06/21/2012    Procedure: ESOPHAGOGASTRODUODENOSCOPY (EGD);  Surgeon: Allison Belfast, MD;  [name];  Service: Endoscopy;  Laterality: N/A;    Allergies  Allergen Reactions  . Altace (Ramipril) Other (See Comments)    Reaction unknown  . Ambien (Zolpidem Tartrate) Other (See Comments)    Reaction unknown  . Codeine Other (See Comments)    Reaction unknown  . Demerol (Meperidine) Other (See Comments)    Reaction unknown  . Penicillins Other (See Comments)    Reaction unknown  . Sulfa Antibiotics Other (See Comments)    Reaction unknown  . Versed (Midazolam) Other (See Comments)    Reaction unknown    I have reviewed the patient's current medications     Lasix 60 mg IV  once  . nitroGLYCERIN  0.5 inch Topical Q6H      . sodium chloride      Medication Sig  Oxygen Recently decreased from 3 -> 2 lpm  acetaminophen (MAPAP) 325 MG tablet Take 650 mg by mouth 3 (three) times daily as needed. For pain  albuterol (PROVENTIL HFA;VENTOLIN HFA) 108 (90 BASE) MCG/ACT inhaler Inhale 2 puffs into the lungs every 4 (four) hours as needed. Wheezing/shortness of breath.  albuterol (PROVENTIL) (2.5 MG/3ML) 0.083%  nebulizer solution Take 2.5 mg by nebulization 3 (three) times daily.  ALPRAZolam (XANAX) 0.25 MG tablet Take 0.25 mg by mouth 2 (two) times daily as needed. For anxiety  aspirin 81 MG chewable tablet Chew 81 mg by mouth daily.  Carboxymethylcellul-Glycerin 0.5-0.9 % SOLN Place 1 drop into both eyes 4 (four) times daily as needed. For dry eyes   Cholecalciferol (VITAMIN D) 1000 UNITS capsule Take 1,000 Units by mouth daily.  clopidogrel (PLAVIX) 75 MG tablet Take 75 mg by mouth daily.  donepezil (ARICEPT) 5 MG tablet Take 5 mg by mouth at bedtime.  feeding supplement (ENSURE COMPLETE) LIQD Take 237 mLs by mouth 2 (two) times daily between meals.  Fluticasone-Salmeterol (ADVAIR) 100-50  MCG/DOSE AEPB Inhale 1 puff into the lungs every 12 (twelve) hours.  furosemide (LASIX) 20 MG tablet Take 20 mg by mouth daily.   Melatonin 1 MG TABS Take 1 mg by mouth at bedtime.  metoprolol tartrate (LOPRESSOR) 25 MG tablet Take 25 mg by mouth 2 (two) times daily.  promethazine (PHENERGAN) 12.5 MG tablet Take 12.5 mg by mouth every 6 (six) hours as needed. For nausea/vomiting.  ranitidine (ZANTAC) 150 MG tablet Take 150 mg by mouth at bedtime.  sodium chloride (OCEAN) 0.65 % SOLN nasal spray Place 2 sprays into the nose every morning. May keep at bedside.  traMADol (ULTRAM) 50 MG tablet Take 50 mg by mouth every 6 (six) hours as needed. For pain.     History   Social History  . Marital Status: Married    Spouse Name: N/A    Number of Children: N/A  . Years of Education: N/A   Occupational History  . Retired housewife    Social History Main Topics  . Smoking status: Former Smoker    Quit date: 12/24/1996  . Smokeless tobacco: Never Used     Comment: per dtr, pt quit tobacco in 1998.  Marland Kitchen Alcohol Use: No  . Drug Use: No  . Sexually Active: No   Other Topics Concern  . Not on file   Social History Narrative   Married, she lives at [facility] in assisted living, husband is in memory care unit.      Family History  Problem Relation Age of Onset  . Coronary artery disease Father   . Coronary artery disease Mother   . Hypertension Mother   . Breast cancer Maternal Grandmother   . Breast cancer Maternal Aunt      ROS: She is very weak and gets very SOB walking to the bathroom. She has not been able to ambulate to the dining room. She was working with physical therapy after d/c, but has recently bee too weak to work with them. No fevers, non-productive cough. Not wheezing. Full 14 point review of systems complete and found to be negative unless listed above.  Physical Exam: Blood pressure 167/91, pulse 95, temperature 97.7 F (36.5 C), temperature source Oral, resp.  rate 29, SpO2 100.00%.  General: Well developed, frail, elderly, female in no acute distress on O2 Head: Eyes PERRLA, No xanthomas.   Normocephalic and atraumatic, oropharynx without edema or exudate. Dentition: poor Lungs: bilateral dense rales Heart: HRRR S1 S2, no rub/gallop,2/6 murmur. pulses are 2+ all 4 extrem.   Neck: No carotid bruits. No lymphadenopathy.  JVD at 12 cm. Abdomen: Bowel sounds present, abdomen soft and non-tender without masses or hernias noted. Msk:  No spine or cva tenderness. Generalized weakness, no joint deformities or effusions. Extremities: No clubbing or cyanosis.  No edema.  Neuro: Alert and oriented X 3. No focal deficits noted. Psych:  Good affect, responds appropriately Skin: No rashes or lesions noted.  Labs:   Lab Results  Component Value Date   WBC 6.4 12/10/2012   HGB 9.8* 12/10/2012   HCT 31.6* 12/10/2012   MCV 83.8 12/10/2012   PLT 297 12/10/2012   No results found for this basename: INR in the last 72 hours   Lab 12/10/12 1105  NA 139  K 4.7  CL 103  CO2 27  BUN 23  CREATININE 1.15*  CALCIUM 9.5  PROT 6.7  BILITOT 0.3  ALKPHOS 78  ALT 24  AST 36  GLUCOSE 116*    Basename 12/10/12 1109  TROPIPOC 0.03   Pro B Natriuretic peptide (BNP)  Date/Time Value Range Status  12/10/2012 11:05 AM 47088.0* 0 - 450 pg/mL Final  11/27/2012  9:47 AM 1813.0* 0.0 - 100.0 pg/mL Final    Echo: 10/16/2012 Study Conclusions - Left ventricle: The cavity size was normal. Wall thickness was increased in a pattern of moderate LVH. Systolic function was mildly to moderately reduced. The estimated ejection fraction was in the range of 40% to 45%. Moderate hypokinesis of the mid-distalanteroseptal, anterior, and apical myocardium. Doppler parameters are consistent with abnormal left ventricular relaxation (grade 1 diastolic dysfunction). - Left atrium: The atrium was mildly dilated. - Pulmonary arteries: Systolic pressure was moderately  increased.  ECG: SR, rate 85, with lateral Twave changes (old) and inferior T wave changes (leads II and AVF are different from 10/19 ECG)  Radiology:  Dg Chest Portable 1 View 12/10/2012  *RADIOLOGY REPORT*  Clinical Data: Shortness of breath.  PORTABLE CHEST - 1 VIEW  Comparison: 10/20/2012.  CT scan from 11/05/2012  Findings: The cardiopericardial silhouette is enlarged. Interstitial markings are diffusely coarsened with chronic features.  There is some persistent airspace disease at the left base. Imaged bony structures of the thorax are intact. Telemetry leads overlie the chest.  IMPRESSION: Cardiomegaly with advanced underlying chronic interstitial lung disease.  Persistent retrocardiac left base airspace opacity.   Original Report Authenticated By: Kennith Center, M.D.     ASSESSMENT AND PLAN:   The patient was seen today by Dr Eden Emms, the patient evaluated and the data reviewed.  Principal Problem:  *Decompensated COPD with exacerbation (chronic obstructive pulmonary disease) - IM to admit and manage.  Active Problems:  Systolic and diastolic CHF, acute on chronic - her BNP is elevated and she likely had some dietary indiscretion (accidental). She needs diuresis but her volume status may be difficult to assess because of the underlying COPD. Lasix 40 mg IV BID can be started and changed to PO once volume improves. Follow renal function closely.  Physical deconditioning - probably needs PT/OT to see once she improves, will leave to primary MD.  Chronic respiratory failure with hypoxia - per IM, when she saw Dr [name], she was maintaining her O2 sats at rest and decompensating with exertion.   Hx NSTEMI - will need to cycle enzymes, but no further workup planned, medical management only.  Otherwise, per primary MD.   Signed: Theodore Demark 12/10/2012, 2:40 PM Co-Sign MD  Patient examined chart reviewed.  CXR with interstial lung disease no edema.  BNP elevated  Good relief with  diuretic in ER.  Daughter is power of attorney and confirms DNR status.   No acute ECG changes.  Exam remarkable for basilar crackles.    Charlton Haws 3:13 PM 12/10/2012

## 2012-12-10 NOTE — H&P (Signed)
[facility] History and Physical  [name] JXB:[id number] DOB: 02/06/1922 DOA: 12/10/2012  Referring physician: ED physician PCP: [name], MD   Chief Complaint: Shortness of breath   HPI:  Pt is 78 yo female with history of CHF and NSTEMI who presents to [facility] ED with main concern of progressively worsening shortness of breath especially with exertion that initially started several days prior to this admission. She explains she get very short of breath even trying to walk small distances at home, to the bathroom but also reports anxiety attacks associated with SOB. She has been recently hospitalized for necrotizing PNA as she explains and has had difficulty getting back to her baseline. She has poor oral intake and 2 pillow orthopnea, she denies chest pain or lower extremity swelling. She also denies fevers, chills, abdominal or urinary concerns. She denies weight gain.  Assessment and Plan:  Principal Problem:  *Acute on Chronic respiratory failure with hypoxia - this is likely multifactorial in etiology and secondary to mild CHF exacerbation imposed on COPD - will admit the pt to stepdown unit for further evaluation and management - appreciate cardiology recommendations - will start Lasix 40 mg IV BID, please note that at home pt is on Lasix 20 mg PO QD - will also obtain 2 D ECHO - daily weight and I's/O's Active Problems:  Decompensated COPD with exacerbation (chronic obstructive pulmonary disease) - management as noted above - nebulizers as needed  ARF (acute renal failure) - creatinine is currently at baseline - BMP in AM  Systolic and diastolic CHF, acute on chronic - management as noted above - will follow upon 2 D EHCO - follow upon renal functions in the setting of diuresis  Physical deconditioning - PT evaluation   Anemia due to chronic illness - Hg and Hct are at baseline - CBC in AM  Hypertension - continue Metoprolol and Lasix as noted above  Code  Status: DNR Family Communication: Pt and daughter at bedside Disposition Plan: Admit to stepdown unit  Review of Systems:  Constitutional: Negative for fever, chills, positive for malaise/fatigue. Negative for diaphoresis.  HENT: Negative for hearing loss, ear pain, nosebleeds, congestion, sore throat, neck pain, tinnitus and ear discharge.   Eyes: Negative for blurred vision, double vision, photophobia, pain, discharge and redness.  Respiratory: Negative for cough, hemoptysis, sputum production, positive for shortness of breath  Cardiovascular: Negative for chest pain, palpitations, orthopnea, claudication and leg swelling.  Gastrointestinal: Negative for nausea, vomiting and abdominal pain. Negative for heartburn, constipation, blood in stool and melena.  Genitourinary: Negative for dysuria, urgency, frequency, hematuria and flank pain.  Musculoskeletal: Negative for myalgias, back pain, joint pain and falls.  Skin: Negative for itching and rash.  Neurological: Negative for dizziness, positive for weakness. Negative for tingling, tremors, sensory change, speech change, focal weakness, loss of consciousness and headaches.  Endo/Heme/Allergies: Negative for environmental allergies and polydipsia. Does not bruise/bleed easily.  Psychiatric/Behavioral: Negative for suicidal ideas. The patient is not nervous/anxious.      Past Medical History  Diagnosis Date  . Hypertension   . Peripheral arterial disease   . Cognitive disorder   . Esophageal stricture     prior dilatation in June 2013  . Osteoarthritis   . Anxiety   . UTI (lower urinary tract infection) June 2013  . COPD (chronic obstructive pulmonary disease)     acute respiratory failure November 2013 with PNA  . NSTEMI (non-ST elevated myocardial infarction) November 2013    felt to be  due to demand ischemia; managed conservatively; not a candidate for invasive testing due to general frailty  . IBS (irritable bowel syndrome)   .  Acute combined systolic and diastolic HF (heart failure)     EF is 40 to 45% per echo November 2013  . Anemia     uncertain etiology  . Dementia   . Anxiety   . Sacral decubitus ulcer     Past Surgical History  Procedure Date  . Femoral bypass   . Esophagogastroduodenoscopy 06/21/2012    Procedure: ESOPHAGOGASTRODUODENOSCOPY (EGD);  Surgeon: Theda Belfast, MD;  Location[facility];  Service: Endoscopy;  Laterality: N/A;    Social History:  reports that she quit smoking about 15 years ago. She has never used smokeless tobacco. She reports that she does not drink alcohol or use illicit drugs.  Allergies  Allergen Reactions  . Altace (Ramipril) Other (See Comments)    Reaction unknown  . Ambien (Zolpidem Tartrate) Other (See Comments)    Reaction unknown  . Codeine Other (See Comments)    Reaction unknown  . Demerol (Meperidine) Other (See Comments)    Reaction unknown  . Penicillins Other (See Comments)    Reaction unknown  . Sulfa Antibiotics Other (See Comments)    Reaction unknown  . Versed (Midazolam) Other (See Comments)    Reaction unknown    Family History  Problem Relation Age of Onset  . Coronary artery disease Father   . Coronary artery disease Mother   . Hypertension Mother   . Breast cancer Maternal Grandmother   . Breast cancer Maternal Aunt     Prior to Admission medications   Medication Sig Start Date End Date Taking? Authorizing Provider  acetaminophen (MAPAP) 325 MG tablet Take 650 mg by mouth 3 (three) times daily as needed. For pain   Yes Historical Provider, MD  albuterol (PROVENTIL HFA;VENTOLIN HFA) 108 (90 BASE) MCG/ACT inhaler Inhale 2 puffs into the lungs every 4 (four) hours as needed. Wheezing/shortness of breath.   Yes Historical Provider, MD  albuterol (PROVENTIL) (2.5 MG/3ML) 0.083% nebulizer solution Take 2.5 mg by nebulization 3 (three) times daily.   Yes Historical Provider, MD  ALPRAZolam (XANAX) 0.25 MG tablet Take 0.25 mg by mouth  2 (two) times daily as needed. For anxiety 10/24/12  Yes Tora Kindred York, PA  aspirin 81 MG chewable tablet Chew 81 mg by mouth daily.   Yes Historical Provider, MD  Carboxymethylcellul-Glycerin 0.5-0.9 % SOLN Place 1 drop into both eyes 4 (four) times daily as needed. For dry eyes    Yes Historical Provider, MD  Cholecalciferol (VITAMIN D) 1000 UNITS capsule Take 1,000 Units by mouth daily.   Yes Historical Provider, MD  clopidogrel (PLAVIX) 75 MG tablet Take 75 mg by mouth daily.   Yes Historical Provider, MD  donepezil (ARICEPT) 5 MG tablet Take 5 mg by mouth at bedtime.   Yes Historical Provider, MD  feeding supplement (ENSURE COMPLETE) LIQD Take 237 mLs by mouth 2 (two) times daily between meals. 10/24/12  Yes Marianne L York, PA  Fluticasone-Salmeterol (ADVAIR) 100-50 MCG/DOSE AEPB Inhale 1 puff into the lungs every 12 (twelve) hours.   Yes Historical Provider, MD  furosemide (LASIX) 20 MG tablet Take 20 mg by mouth daily.  10/24/12  Yes Marianne L York, PA  Melatonin 1 MG TABS Take 1 mg by mouth at bedtime.   Yes Historical Provider, MD  metoprolol tartrate (LOPRESSOR) 25 MG tablet Take 25 mg by mouth 2 (two) times daily.  Yes Historical Provider, MD  promethazine (PHENERGAN) 12.5 MG tablet Take 12.5 mg by mouth every 6 (six) hours as needed. For nausea/vomiting.   Yes Historical Provider, MD  ranitidine (ZANTAC) 150 MG tablet Take 150 mg by mouth at bedtime.   Yes Historical Provider, MD  sodium chloride (OCEAN) 0.65 % SOLN nasal spray Place 2 sprays into the nose every morning. May keep at bedside.   Yes Historical Provider, MD  traMADol (ULTRAM) 50 MG tablet Take 50 mg by mouth every 6 (six) hours as needed. For pain.   Yes Historical Provider, MD    Physical Exam: Filed Vitals:   12/10/12 1257 12/10/12 1300 12/10/12 1315 12/10/12 1430  BP: 191/77 182/85 167/91 128/72  Pulse: 95 94 95 85  Temp:      TempSrc:      Resp: 34 28 29 28   SpO2: 100% 100% 100% 95%    Physical Exam   Constitutional: Appears well-developed and well-nourished. No distress.  HENT: Normocephalic. External right and left ear normal. Oropharynx is clear and moist.  Eyes: Conjunctivae and EOM are normal. PERRLA, no scleral icterus.  Neck: Normal ROM. Neck supple. No JVD. No tracheal deviation. No thyromegaly.  CVS: RRR, S1/S2 +, no murmurs, no gallops, no carotid bruit.  Pulmonary: Rales bilaterally, no wheezing or stridor Abdominal: Soft. BS +,  no distension, tenderness, rebound or guarding.  Musculoskeletal: Normal range of motion. No edema and no tenderness.  Lymphadenopathy: No lymphadenopathy noted, cervical, inguinal. Neuro: Alert. Normal reflexes, muscle tone coordination. No cranial nerve deficit. Skin: Skin is warm and dry. No rash noted. Not diaphoretic. No erythema. No pallor.  Psychiatric: Normal mood and affect. Behavior, judgment, thought content normal.   Labs on Admission:  Basic Metabolic Panel:  Lab 12/10/12 1610  NA 139  K 4.7  CL 103  CO2 27  GLUCOSE 116*  BUN 23  CREATININE 1.15*  CALCIUM 9.5  MG --  PHOS --   Liver Function Tests:  Lab 12/10/12 1105  AST 36  ALT 24  ALKPHOS 78  BILITOT 0.3  PROT 6.7  ALBUMIN 2.9*   CBC:  Lab 12/10/12 1105  WBC 6.4  NEUTROABS 5.0  HGB 9.8*  HCT 31.6*  MCV 83.8  PLT 297   Radiological Exams on Admission: Dg Chest Portable 1 View 12/10/2012  Cardiomegaly with advanced underlying chronic interstitial lung disease.   Persistent retrocardiac left base airspace opacity.     EKG: Normal sinus rhythm, no ST/T wave changes  Debbora Presto, MD  [facility] Pager [phone]  If 7PM-7AM, please contact night-coverage [email or web address] Password [email or web address] 12/10/2012, 3:10 PM

## 2012-12-10 NOTE — ED Notes (Signed)
Echo at bedside

## 2012-12-10 NOTE — Clinical Social Work Note (Signed)
CSW reviewed pt chart and noticed pt from ALF.  CSW called [facility] at [phone] and confirmed that pt was a resident at this Assisted Living Facility.  CSW is available for assistance if needed.  Vickii Penna, LCSWA [phone]  Clinical Social Work

## 2012-12-10 NOTE — Progress Notes (Signed)
PHARMACIST - PHYSICIAN ORDER COMMUNICATION  CONCERNING: P&T Medication Policy on Herbal Medications  DESCRIPTION:  This patient's order for:  Melatonin  has been noted.  This product(s) is classified as an "herbal" or natural product. Due to a lack of definitive safety studies or FDA approval, nonstandard manufacturing practices, plus the potential risk of unknown drug-drug interactions while on inpatient medications, the Pharmacy and Therapeutics Committee does not permit the use of "herbal" or natural products of this type within [facility].   ACTION TAKEN: The pharmacy department is unable to verify this order at this time and your patient has been informed of this safety policy. Please reevaluate patient's clinical condition at discharge and address if the herbal or natural product(s) should be resumed at that time.  Thank you Otho Bellows PharmD 12/10/2012 3:47 PM

## 2012-12-10 NOTE — Progress Notes (Signed)
Clinical Social Work Department BRIEF PSYCHOSOCIAL ASSESSMENT 12/10/2012  Patient:  [name], [name]     Account Number:  [id number]     Admit date:  12/10/2012  Clinical Social Worker:  Doree Albee  Date/Time:  12/10/2012 04:38 PM  Referred by:  RN  Date Referred:  12/10/2012 Referred for  ALF Placement   Other Referral:   Interview type:  Patient Other interview type:    PSYCHOSOCIAL DATA Living Status:  FACILITY Admitted from facility:  [facility] Level of care:  Assisted Living Primary support name:  Sunny Schlein Primary support relationship to patient:  CHILD, ADULT Degree of support available:   hcpoa, strong    CURRENT CONCERNS Current Concerns  Post-Acute Placement   Other Concerns:    SOCIAL WORK ASSESSMENT / PLAN CSW met with pt and pt daughter, Sunny Schlein who is pt hcpoa at bedside to complete psychosocial assessment. CSW introduced self and CSW role. Pt shared that she is a resident at [facility] where she has lived since June 2012. Pt spouse is also a resident there but in memory care. Pt is a resident in [facility] traditional assisted living.    CSW will complete fl2 for MD signature and will place in patient shadow chart.    CSW spoke with Karel Jarvis at [facility] who confirmed that patient is a resident. Per facility, pt may need a nurse assessment before patient can return. Unit CSW to follow up with ALF to discuss patient discharge plan. Per chart review pt will have a phsycial therapy evaluation. As of now, pt plans to return to [facility] when medically stable unless a higher level of care is recommended for patient.   Assessment/plan status:  Psychosocial Support/Ongoing Assessment of Needs Other assessment/ plan:   discharge planning   Information/referral to community resources:   none identified at this time, pt plans to return to ALF    PATIENT'S/FAMILY'S RESPONSE TO PLAN OF CARE: Pt and pt  daughter thanked csw for concern and support. Pt is motivated to return to [facility] ALF when medically stable.    Catha Gosselin, LCSWA  [phone] .12/10/2012 1645pm

## 2012-12-10 NOTE — ED Notes (Signed)
Pt placed on 3L [place]. Sats in 95%.

## 2012-12-11 DIAGNOSIS — I5043 Acute on chronic combined systolic (congestive) and diastolic (congestive) heart failure: Principal | ICD-10-CM

## 2012-12-11 DIAGNOSIS — I509 Heart failure, unspecified: Secondary | ICD-10-CM

## 2012-12-11 DIAGNOSIS — F411 Generalized anxiety disorder: Secondary | ICD-10-CM

## 2012-12-11 DIAGNOSIS — D638 Anemia in other chronic diseases classified elsewhere: Secondary | ICD-10-CM

## 2012-12-11 LAB — BASIC METABOLIC PANEL
BUN: 23 mg/dL (ref 6–23)
Calcium: 9.7 mg/dL (ref 8.4–10.5)
Creatinine, Ser: 1.38 mg/dL — ABNORMAL HIGH (ref 0.50–1.10)
GFR calc Af Amer: 38 mL/min — ABNORMAL LOW (ref >90)
GFR calc non Af Amer: 33 mL/min — ABNORMAL LOW (ref >90)
Glucose, Bld: 95 mg/dL (ref 70–99)

## 2012-12-11 LAB — TROPONIN I: Troponin I: <0.3 ng/mL (ref <0.30)

## 2012-12-11 LAB — CBC
MCH: 25.8 pg — ABNORMAL LOW (ref 26.0–34.0)
MCHC: 31 g/dL (ref 30.0–36.0)
MCV: 83.3 fL (ref 78.0–100.0)
Platelets: 293 10*3/uL (ref 150–400)

## 2012-12-11 MED ORDER — ALPRAZOLAM 0.25 MG PO TABS
0.2500 mg | ORAL_TABLET | Freq: Three times a day (TID) | ORAL | Status: DC | PRN
  Administered 2012-12-11 – 2012-12-13 (×6): 0.25 mg via ORAL
  Filled 2012-12-11 (×6): qty 1

## 2012-12-11 NOTE — Progress Notes (Signed)
Tx room 1417 by W/C after report to [employer]

## 2012-12-11 NOTE — Progress Notes (Deleted)
Tx to room 1432 by bed after report to [facility]

## 2012-12-11 NOTE — Progress Notes (Signed)
Patient ID: [name], female   DOB: [birth year], 78 y.o.   MRN: [id number]  [facility] PROGRESS NOTE  Dawana Asper NWG:[id number] DOB: Jan 24, 1922 DOA: 12/10/2012 PCP: [name], MD  Brief narrative:  HPI:  Pt is 78 yo female with history of CHF and NSTEMI who presents to [facility] ED with main concern of progressively worsening shortness of breath especially with exertion that initially started several days prior to this admission. She explains she get very short of breath even trying to walk small distances at home, to the bathroom but also reports anxiety attacks associated with SOB. She has been recently hospitalized for necrotizing PNA as she explains and has had difficulty getting back to her baseline. She has poor oral intake and 2 pillow orthopnea, she denies chest pain or lower extremity swelling. She also denies fevers, chills, abdominal or urinary concerns. She denies weight gain.   Assessment and Plan:  Principal Problem:  *Acute on Chronic respiratory failure with hypoxia  - this is likely multifactorial in etiology and secondary to mild CHF exacerbation imposed on COPD  - per 2 D ECHO systolic function reduced to 35% - will continue to follow upon cardiology recommendations  - daily weight and I's/O's  Active Problems:  Decompensated COPD with exacerbation (chronic obstructive pulmonary disease)  - management with oxygen and nebulizers as needed ARF (acute renal failure)  - creatinine is trending up with diuresis - plan to transition to PO Lasix in AM - BMP in AM  Systolic and diastolic CHF, acute on chronic  - management as noted above  - follow upon renal functions in the setting of diuresis  Physical deconditioning  - PT evaluation  Anemia due to chronic illness  - Hg and Hct are at baseline  - CBC in AM  Hypertension  - continue Metoprolol and Lasix as noted above   Consultants:  Cardiology  Procedures/Studies: Dg Chest Portable 1 View 12/10/2012   Cardiomegaly with advanced underlying chronic interstitial lung disease.  Persistent retrocardiac left base airspace opacity.     Antibiotics: None  Code Status:DNR Family Communication: Pt at bedside, daughter over the phone Disposition Plan: Transfer to telemetry bed  HPI/Subjective: No events overnight.   Objective: Filed Vitals:   12/11/12 0700 12/11/12 0800 12/11/12 1114 12/11/12 1200  BP: 141/63 157/56  139/50  Pulse: 66 77    Temp:  98.1 F (36.7 C)  97.9 F (36.6 C)  TempSrc:  Oral  Oral  Resp: 22 22    Height:      Weight:      SpO2: 95% 96% 97%     Intake/Output Summary (Last 24 hours) at 12/11/12 1448 Last data filed at 12/11/12 1225  Gross per 24 hour  Intake    160 ml  Output   4025 ml  Net  -3865 ml    Exam:   General:  Pt is alert, follows commands appropriately, not in acute distress  Cardiovascular: Regular rate and rhythm, S1/S2, no murmurs, no rubs, no gallops  Respiratory: Clear to auscultation bilaterally, minimal bibasilar crackles   Abdomen: Soft, non tender, non distended, bowel sounds present, no guarding  Extremities: No edema, pulses DP and PT palpable bilaterally  Neuro: Grossly nonfocal  Data Reviewed: Basic Metabolic Panel:  Lab 12/11/12 5784 12/10/12 1105  NA 136 139  K 5.1 4.7  CL 96 103  CO2 35* 27  GLUCOSE 95 116*  BUN 23 23  CREATININE 1.38* 1.15*  CALCIUM 9.7 9.5  MG -- --  PHOS -- --   Liver Function Tests:  Lab 12/10/12 1105  AST 36  ALT 24  ALKPHOS 78  BILITOT 0.3  PROT 6.7  ALBUMIN 2.9*   CBC:  Lab 12/11/12 0244 12/10/12 1105  WBC 9.2 6.4  NEUTROABS -- 5.0  HGB 9.9* 9.8*  HCT 31.9* 31.6*  MCV 83.3 83.8  PLT 293 297   Cardiac Enzymes:  Lab 12/11/12 0244 12/10/12 2059 12/10/12 1520  CKTOTAL -- -- --  CKMB -- -- --  CKMBINDEX -- -- --  TROPONINI <0.30 <0.30 <0.30     Recent Results (from the past 240 hour(s))  MRSA PCR SCREENING     Status: Normal   Collection Time   12/10/12  5:44  PM      Component Value Range Status Comment   MRSA by PCR NEGATIVE  NEGATIVE Final      Scheduled Meds:   . aspirin  81 mg Oral Daily  . clopidogrel  75 mg Oral QAC breakfast  . donepezil  5 mg Oral QHS  . enoxaparin  30 mg Subcutaneous Q24H  . famotidine  20 mg Oral Daily  . feeding supplement  237 mL Oral BID BM  . furosemide  40 mg Intravenous BID  . metoprolol tartrate  25 mg Oral BID  . mometasone-formoterol  2 puff Inhalation BID  . nitroGLYCERIN  0.5 inch Topical Q6H  . sodium chloride  2 spray Nasal q morning - 10a  . sodium chloride  3 mL Intravenous Q12H   Continuous Infusions:    Debbora Presto, MD  [facility] Pager [phone]  If 7PM-7AM, please contact night-coverage [email or web address] Password [email or web address] 12/11/2012, 2:48 PM   LOS: 1 day

## 2012-12-11 NOTE — Evaluation (Signed)
Physical Therapy Evaluation Patient Details Name: [name] MRN: [id number] DOB: 1922-12-21 Today's Date: 12/11/2012 Time: 5621-3086 PT Time Calculation (min): 33 min  PT Assessment / Plan / Recommendation Clinical Impression  Pt. is 78 yo admitted 12/18 progressive weakness, pneumonia. Pt recently in hospital with necrotizing Pneumonia. NSTEMI< went to [facility] for rehab. Has been back to ALF x 3 weeks. Pt. compromised in breathing on 4 l. for short, 8 ft., walk. Pt. will benefit from PT while in acute care. Will see how pt progresses to determine need for SNF again.     PT Assessment  Patient needs continued PT services    Follow Up Recommendations  Home health PT;SNF (may need more assistance than ALF.)    Does the patient have the potential to tolerate intense rehabilitation      Barriers to Discharge Decreased caregiver support      Equipment Recommendations  None recommended by PT    Recommendations for Other Services OT consult   Frequency Min 3X/week    Precautions / Restrictions Precautions Precaution Comments: dyspnea, On home O2   Pertinent Vitals/Pain Reports both legs are cramping. Has had Xanax      Mobility  Bed Mobility Bed Mobility: Supine to Sit Supine to Sit: 5: Supervision;HOB elevated;With rails Details for Bed Mobility Assistance: pt required no ssistance Transfers Transfers: Sit to Stand;Stand to Sit Sit to Stand: 4: Min guard Stand to Sit: 4: Min guard Details for Transfer Assistance: pt moved quickly. cues to slow down. Ambulation/Gait Ambulation/Gait Assistance: 1: +2 Total assist Ambulation/Gait: Patient Percentage: 70% Ambulation Distance (Feet): 8 Feet Assistive device: Rolling walker Ambulation/Gait Assistance Details: cues to slow down, Gait Pattern: Step-through pattern General Gait Details: limited by dyspnea.    Shoulder Instructions     Exercises     PT Diagnosis: Difficulty walking;Acute pain;Generalized weakness  PT  Problem List: Decreased strength;Decreased activity tolerance;Decreased knowledge of use of DME;Cardiopulmonary status limiting activity;Pain PT Treatment Interventions: DME instruction;Gait training;Functional mobility training;Therapeutic activities;Therapeutic exercise;Patient/family education   PT Goals Acute Rehab PT Goals PT Goal Formulation: With patient Time For Goal Achievement: 12/25/12 Potential to Achieve Goals: Good Pt will go Sit to Stand: with modified independence PT Goal: Sit to Stand - Progress: Goal set today Pt will go Stand to Sit: with modified independence PT Goal: Stand to Sit - Progress: Goal set today Pt will Ambulate: 51 - 150 feet;with supervision;with rolling walker PT Goal: Ambulate - Progress: Goal set today Pt will Perform Home Exercise Program: with supervision, verbal cues required/provided PT Goal: Perform Home Exercise Program - Progress: Goal set today  Visit Information  Last PT Received On: 12/11/12 Assistance Needed: +2    Subjective Data  Subjective: It does not take much to get me short of brath Patient Stated Goal: I will see what I need. I can go to rehab.   Prior Functioning  Home Living Available Help at Discharge: Available PRN/intermittently Type of Home: Assisted living Home Adaptive Equipment: Walker - rolling;Wheelchair - manual Additional Comments: pt was at [facility] x 1 month. Had been home ALF  x 3 weekes. Prior Function Level of Independence: Needs assistance Needs Assistance: Bathing;Meal Prep;Gait Gait Assistance: has been taking meals in her room. Driving: No Communication Communication: HOH    Cognition  Overall Cognitive Status: Impaired Area of Impairment: Memory Arousal/Alertness: Awake/alert Orientation Level: Appears intact for tasks assessed Behavior During Session: [facility] for tasks performed Memory Deficits: Pt. stated that she has some memory problems, wants her daughter to get  info    Extremity/Trunk  Assessment Right Upper Extremity Assessment RUE ROM/Strength/Tone: [facility] for tasks assessed Left Upper Extremity Assessment LUE ROM/Strength/Tone: [facility] for tasks assessed Right Lower Extremity Assessment RLE ROM/Strength/Tone: [facility] for tasks assessed Left Lower Extremity Assessment LLE ROM/Strength/Tone: WFL for tasks assessed Trunk Assessment Trunk Assessment: Normal   Balance Static Sitting Balance Static Sitting - Balance Support: Feet supported;No upper extremity supported Static Sitting - Level of Assistance: 7: Independent  End of Session PT - End of Session Activity Tolerance: Treatment limited secondary to medical complications (Comment) (dyspnea.) Patient left: in chair;with call bell/phone within reach;with nursing in room Nurse Communication: Mobility status  GP     Rada Hay 12/11/2012, 9:58 AM  [phone]

## 2012-12-11 NOTE — Evaluation (Signed)
Physical Therapy Evaluation Patient Details Name: [name] MRN: [id number] DOB: 11-Sep-1922 Today's Date: 12/11/2012 Time: 9147-8295 PT Time Calculation (min): 18 min  PT Assessment / Plan / Recommendation Clinical Impression  Pt. is 78 yo admitted 12/18 progressive weakness, pneumonia. Pt recently in hospital with necrotizing Pneumonia. NSTEMI< went to [facility] for rehab. Has been back to ALF x 3 weeks. Pt. compromised in breathing on 4 l. for short, 8 ft., walk. Pt. will benefit from PT while in acute care. Will see how pt progresses to determine need for SNF again.     PT Assessment  Patient needs continued PT services    Follow Up Recommendations  Home health PT;SNF (may need more assistance than ALF.)    Does the patient have the potential to tolerate intense rehabilitation      Barriers to Discharge Decreased caregiver support      Equipment Recommendations  None recommended by PT    Recommendations for Other Services OT consult   Frequency Min 3X/week    Precautions / Restrictions Precautions Precaution Comments: dyspnea, On home O2 Restrictions Weight Bearing Restrictions: No   Pertinent Vitals/Pain Reports that legs are cramping. Had xanax.      Mobility  Bed Mobility Bed Mobility: Supine to Sit Supine to Sit: 5: Supervision;HOB elevated;With rails Details for Bed Mobility Assistance: pt required no ssistance Transfers Transfers: Sit to Stand;Stand to Sit Sit to Stand: 4: Min guard Stand to Sit: 4: Min guard Details for Transfer Assistance: pt moved quickly. cues to slow down. Ambulation/Gait Ambulation/Gait Assistance: 1: +2 Total assist Ambulation/Gait: Patient Percentage: 70% Ambulation Distance (Feet): 8 Feet Assistive device: Rolling walker Ambulation/Gait Assistance Details: cues to slow down, Gait Pattern: Step-through pattern General Gait Details: limited by dyspnea.    Shoulder Instructions     Exercises     PT Diagnosis: Difficulty  walking;Acute pain;Generalized weakness  PT Problem List: Decreased strength;Decreased activity tolerance;Decreased knowledge of use of DME;Cardiopulmonary status limiting activity;Pain PT Treatment Interventions: DME instruction;Gait training;Functional mobility training;Therapeutic activities;Therapeutic exercise;Patient/family education   PT Goals Acute Rehab PT Goals PT Goal Formulation: With patient Time For Goal Achievement: 12/25/12 Potential to Achieve Goals: Good Pt will go Sit to Stand: with modified independence PT Goal: Sit to Stand - Progress: Goal set today Pt will go Stand to Sit: with modified independence PT Goal: Stand to Sit - Progress: Goal set today Pt will Ambulate: 51 - 150 feet;with supervision;with rolling walker PT Goal: Ambulate - Progress: Goal set today Pt will Perform Home Exercise Program: with supervision, verbal cues required/provided PT Goal: Perform Home Exercise Program - Progress: Goal set today  Visit Information  Last PT Received On: 12/11/12 Assistance Needed: +2    Subjective Data  Subjective: It does not take much to get me short of brath Patient Stated Goal: I will see what I need. I can go to rehab.   Prior Functioning  Home Living Available Help at Discharge: Available PRN/intermittently Type of Home: Assisted living Home Adaptive Equipment: Walker - rolling;Wheelchair - manual Additional Comments: pt was at [facility] x 1 month. Had been home ALF  x 3 weekes. Prior Function Level of Independence: Needs assistance Needs Assistance: Bathing;Meal Prep;Gait Gait Assistance: has been taking meals in her room. Driving: No Communication Communication: HOH    Cognition  Overall Cognitive Status: Impaired Area of Impairment: Memory Arousal/Alertness: Awake/alert Orientation Level: Appears intact for tasks assessed Behavior During Session: [facility] for tasks performed Memory Deficits: Pt. stated that she has some memory problems, wants  her  daughter to get info    Extremity/Trunk Assessment Right Upper Extremity Assessment RUE ROM/Strength/Tone: [facility] for tasks assessed Left Upper Extremity Assessment LUE ROM/Strength/Tone: [facility] for tasks assessed Right Lower Extremity Assessment RLE ROM/Strength/Tone: [facility] for tasks assessed Left Lower Extremity Assessment LLE ROM/Strength/Tone: [facility] for tasks assessed Trunk Assessment Trunk Assessment: Normal   Balance Static Sitting Balance Static Sitting - Balance Support: Feet supported;No upper extremity supported Static Sitting - Level of Assistance: 7: Independent  End of Session PT - End of Session Activity Tolerance: Treatment limited secondary to medical complications (Comment) (dyspnea.) Patient left: in chair;with call bell/phone within reach;with nursing in room Nurse Communication: Mobility status  GP     Rada Hay 12/11/2012, 11:44 AM

## 2012-12-11 NOTE — Progress Notes (Signed)
   Subjective:  No chest pain; dyspnea improving   Objective:  Filed Vitals:   12/11/12 0300 12/11/12 0400 12/11/12 0500 12/11/12 0600  BP: 150/58 142/47 150/50 150/54  Pulse: 70 76 77 73  Temp:  97.9 F (36.6 C)    TempSrc:      Resp: 30 28 30 27   Height:      Weight:  91 lb 4.3 oz (41.4 kg)    SpO2: 90% 96% 96% 97%    Intake/Output from previous day:  Intake/Output Summary (Last 24 hours) at 12/11/12 0715 Last data filed at 12/11/12 0539  Gross per 24 hour  Intake      0 ml  Output   3302 ml  Net  -3302 ml    Physical Exam: Physical exam: Well-developed frail in no acute distress.  Skin is warm and dry.  HEENT is normal.  Neck is supple.  Chest with mildly diminised BS Cardiovascular exam is regular rate and rhythm.  Abdominal exam nontender or distended. No masses palpated. Extremities show no edema. neuro grossly intact    Lab Results: Basic Metabolic Panel:  Basename 12/11/12 0244 12/10/12 1105  NA 136 139  K 5.1 4.7  CL 96 103  CO2 35* 27  GLUCOSE 95 116*  BUN 23 23  CREATININE 1.38* 1.15*  CALCIUM 9.7 9.5  MG -- --  PHOS -- --   CBC:  Basename 12/11/12 0244 12/10/12 1105  WBC 9.2 6.4  NEUTROABS -- 5.0  HGB 9.9* 9.8*  HCT 31.9* 31.6*  MCV 83.3 83.8  PLT 293 297   Cardiac Enzymes:  Basename 12/11/12 0244 12/10/12 2059 12/10/12 1520  CKTOTAL -- -- --  CKMB -- -- --  CKMBINDEX -- -- --  TROPONINI <0.30 <0.30 <0.30     Assessment/Plan:  1 acute on chronic combined systolic and diastolic congestive heart failure-she is improving. I would change Lasix back to 40 mg daily tomorrow AM. Follow renal function. 2 COPD/interstitial lung disease-management per primary care. 3 cardiomyopathy -continue beta blocker. I have reviewed Dr. Harvie Bridge most recent office note. He plans conservative therapy given age. I will have her return to see him in 4-6 weeks after discharge for consideration of an ACE inhibitor. I will not add at this point given  renal insufficiency and ongoing diuresis. 4 recent non-ST elevation myocardial infarction-felt secondary to demand ischemia. As outlined previously plan conservative measures. Continue aspirin and Plavix.  Olga Millers 12/11/2012, 7:15 AM

## 2012-12-12 LAB — BASIC METABOLIC PANEL
CO2: 37 mEq/L — ABNORMAL HIGH (ref 19–32)
Chloride: 92 mEq/L — ABNORMAL LOW (ref 96–112)
Creatinine, Ser: 1.4 mg/dL — ABNORMAL HIGH (ref 0.50–1.10)
GFR calc Af Amer: 38 mL/min — ABNORMAL LOW (ref >90)
GFR calc non Af Amer: 32 mL/min — ABNORMAL LOW (ref >90)
Glucose, Bld: 91 mg/dL (ref 70–99)

## 2012-12-12 LAB — CBC
Hemoglobin: 9.9 g/dL — ABNORMAL LOW (ref 12.0–15.0)
MCHC: 31 g/dL (ref 30.0–36.0)
RBC: 3.9 MIL/uL (ref 3.87–5.11)

## 2012-12-12 MED ORDER — FUROSEMIDE 20 MG PO TABS
20.0000 mg | ORAL_TABLET | Freq: Every day | ORAL | Status: DC
  Administered 2012-12-12 – 2012-12-13 (×2): 20 mg via ORAL
  Filled 2012-12-12 (×2): qty 1

## 2012-12-12 NOTE — Progress Notes (Signed)
INITIAL NUTRITION ASSESSMENT  DOCUMENTATION CODES Per approved criteria  -Underweight   INTERVENTION: 1.  Modify diet; recommend diet liberalization to Regular.  Continue with current supplementation regimen.  NUTRITION DIAGNOSIS: Inadequate oral intake related to poor appetite as evidenced by pt report, PO 40-45% of meals.   Monitor:  1. Food/Beverage; pt to consume >50% of meals on average. 2.  Wt/wt change; deter further loss  Reason for Assessment: consult; assessment of needs  78 y.o. female  Admitting Dx: Chronic respiratory failure with hypoxia  ASSESSMENT: Pt admitted for shortness of breath.   Pt currently consuming 40-45% of meals and Ensure.  She requests diet liberalization to Regular as she does not eat a heart healthy diet at home and states she is a "finicky" eater.  MD paged.  Height: Ht Readings from Last 1 Encounters:  12/11/12 5\' 1"  (1.549 m)    Weight: Wt Readings from Last 1 Encounters:  12/12/12 88 lb 6.5 oz (40.1 kg)    Ideal Body Weight: 47.7 kg  % Ideal Body Weight: 84%  Wt Readings from Last 10 Encounters:  12/12/12 88 lb 6.5 oz (40.1 kg)  12/02/12 91 lb 12.8 oz (41.64 kg)  11/27/12 92 lb (41.731 kg)  11/12/12 92 lb 12.8 oz (42.094 kg)  11/06/12 93 lb 12.8 oz (42.547 kg)  10/24/12 114 lb 13.8 oz (52.1 kg)  06/14/12 91 lb 0.8 oz (41.3 kg)  06/14/12 91 lb 0.8 oz (41.3 kg)    Usual Body Weight:  Wt Readings from Last 10 Encounters:  12/12/12 88 lb 6.5 oz (40.1 kg)  12/02/12 91 lb 12.8 oz (41.64 kg)  11/27/12 92 lb (41.731 kg)  11/12/12 92 lb 12.8 oz (42.094 kg)  11/06/12 93 lb 12.8 oz (42.547 kg)  10/24/12 114 lb 13.8 oz (52.1 kg)  06/14/12 91 lb 0.8 oz (41.3 kg)  06/14/12 91 lb 0.8 oz (41.3 kg)   % Usual Body Weight: 96%  BMI:  Body mass index is 16.70 kg/(m^2).  Estimated Nutritional Needs: Kcal: 1100-1250 Protein: 48-56g Fluid: >1.2 L/day  Skin: wnl  Diet Order: Cardiac  EDUCATION NEEDS: -No education needs  identified at this time   Intake/Output Summary (Last 24 hours) at 12/12/12 1456 Last data filed at 12/12/12 0906  Gross per 24 hour  Intake    360 ml  Output   1200 ml  Net   -840 ml    Last BM: 12/19   Labs:   Lab 12/12/12 0445 12/11/12 0244 12/10/12 1105  NA 136 136 139  K 3.7 5.1 4.7  CL 92* 96 103  CO2 37* 35* 27  BUN 30* 23 23  CREATININE 1.40* 1.38* 1.15*  CALCIUM 9.0 9.7 9.5  MG -- -- --  PHOS -- -- --  GLUCOSE 91 95 116*    CBG (last 3)  No results found for this basename: GLUCAP:3 in the last 72 hours  Scheduled Meds:   . aspirin  81 mg Oral Daily  . clopidogrel  75 mg Oral QAC breakfast  . donepezil  5 mg Oral QHS  . enoxaparin  30 mg Subcutaneous Q24H  . famotidine  20 mg Oral Daily  . feeding supplement  237 mL Oral BID BM  . furosemide  20 mg Oral Daily  . metoprolol tartrate  25 mg Oral BID  . mometasone-formoterol  2 puff Inhalation BID  . nitroGLYCERIN  0.5 inch Topical Q6H  . sodium chloride  2 spray Nasal q morning - 10a  . sodium  chloride  3 mL Intravenous Q12H    Continuous Infusions:   Past Medical History  Diagnosis Date  . Hypertension   . Peripheral arterial disease   . Cognitive disorder   . Esophageal stricture     prior dilatation in June 2013  . Osteoarthritis   . Anxiety   . UTI (lower urinary tract infection) June 2013  . COPD (chronic obstructive pulmonary disease)     acute respiratory failure November 2013 with PNA  . NSTEMI (non-ST elevated myocardial infarction) November 2013    felt to be due to demand ischemia; managed conservatively; not a candidate for invasive testing due to general frailty  . IBS (irritable bowel syndrome)   . Acute combined systolic and diastolic HF (heart failure)     EF is 40 to 45% per echo November 2013  . Anemia     uncertain etiology  . Dementia   . Anxiety   . Sacral decubitus ulcer     Past Surgical History  Procedure Date  . Femoral bypass   . Esophagogastroduodenoscopy  06/21/2012    Procedure: ESOPHAGOGASTRODUODENOSCOPY (EGD);  Surgeon: Theda Belfast, MD;  Location: [facility];  Service: Endoscopy;  Laterality: N/A;    Loyce Dys, MS RD LDN Clinical Inpatient Dietitian Pager: [phone] Weekend/After hours pager: [phone]

## 2012-12-12 NOTE — Progress Notes (Signed)
   Subjective:  No chest pain; dyspnea improving   Objective:  Filed Vitals:   12/11/12 1600 12/11/12 1711 12/11/12 2100 12/12/12 0540  BP:  139/53 128/50 139/53  Pulse: 80 70 76 71  Temp:  97.9 F (36.6 C)  98 F (36.7 C)  TempSrc:  Oral  Oral  Resp: 25 22 24 22   Height:  5\' 1"  (1.549 m)    Weight:  91 lb (41.277 kg)  88 lb 6.5 oz (40.1 kg)  SpO2: 97% 99% 94% 94%    Intake/Output from previous day:  Intake/Output Summary (Last 24 hours) at 12/12/12 0703 Last data filed at 12/12/12 0500  Gross per 24 hour  Intake    400 ml  Output   1925 ml  Net  -1525 ml    Physical Exam: Physical exam: Well-developed frail in no acute distress.  Skin is warm and dry.  HEENT is normal.  Neck is supple.  Chest CTA Cardiovascular exam is regular rate and rhythm.  Abdominal exam nontender or distended. No masses palpated. Extremities show no edema. neuro grossly intact    Lab Results: Basic Metabolic Panel:  Basename 12/12/12 0445 12/11/12 0244  NA 136 136  K 3.7 5.1  CL 92* 96  CO2 37* 35*  GLUCOSE 91 95  BUN 30* 23  CREATININE 1.40* 1.38*  CALCIUM 9.0 9.7  MG -- --  PHOS -- --   CBC:  Basename 12/12/12 0445 12/11/12 0244 12/10/12 1105  WBC 6.7 9.2 --  NEUTROABS -- -- 5.0  HGB 9.9* 9.9* --  HCT 31.9* 31.9* --  MCV 81.8 83.3 --  PLT 288 293 --   Cardiac Enzymes:  Basename 12/11/12 0244 12/10/12 2059 12/10/12 1520  CKTOTAL -- -- --  CKMB -- -- --  CKMBINDEX -- -- --  TROPONINI <0.30 <0.30 <0.30     Assessment/Plan:  1 acute on chronic combined systolic and diastolic congestive heart failure-she is improving. I would change Lasix back to 20 mg daily with additional 20 mg daily as needed. Would repeat BMET in one week with results to Dr [name] Hashimoto if patient [facility] today. 2 COPD/interstitial lung disease-management per primary care. This appears to be significant issue. 3 cardiomyopathy -continue beta blocker. I have reviewed Dr. Harvie Bridge most recent office note.  He plans conservative therapy given age. I would have her return to see him in 4-6 weeks after discharge for consideration of an ACE inhibitor. I will not add at this point given renal insufficiency and ongoing diuresis. 4 recent non-ST elevation myocardial infarction-felt secondary to demand ischemia. As outlined previously plan conservative measures. Continue aspirin and Plavix. Add pravachol 20 mg po daily. We will sign off; please call with questions.  Olga Millers 12/12/2012, 7:03 AM

## 2012-12-12 NOTE — Progress Notes (Signed)
Physical Therapy Treatment Patient Details Name: [name] MRN: [id number] DOB: [birth year] Today's Date: 12/12/2012 Time: 1191-4782 PT Time Calculation (min): 29 min  PT Assessment / Plan / Recommendation Comments on Treatment Session  Pt is from [facility] on home O2 admitted for dyspnea.  Pt feeling much better and amb in hallway on 3 lts O2.  Assisted to BR twice for BM.  Assisted back to bed per pt request to take a nap.      Follow Up Recommendations  Home health PT     Does the patient have the potential to tolerate intense rehabilitation     Barriers to Discharge        Equipment Recommendations  None recommended by PT    Recommendations for Other Services    Frequency Min 3X/week   Plan Discharge plan remains appropriate    Precautions / Restrictions Precautions Precaution Comments: dyspnea, On home O2  Restrictions Weight Bearing Restrictions: No   Pertinent Vitals/Pain No c/o pain    Mobility  Bed Mobility Bed Mobility: Sit to Supine Sit to Supine: 5: Supervision Details for Bed Mobility Assistance: pt required no ssistance  Transfers Transfers: Sit to Stand;Stand to Sit Sit to Stand: 5: Supervision;From chair/3-in-1;From toilet Stand to Sit: 5: Supervision;To chair/3-in-1;To toilet;To bed Details for Transfer Assistance: a little impulsive, VC's on safety esp with O2 tubing  Ambulation/Gait Ambulation/Gait Assistance: 4: Min guard Ambulation Distance (Feet): 125 Feet Assistive device: Rolling walker Ambulation/Gait Assistance Details: Assistance with O2 tank and safety cueing on O2 tubing.  Pt a bit impulsive and requires cueing on safety with turns. Gait Pattern: Step-through pattern    PT Goals                                         progressing    Visit Information  Last PT Received On: 12/12/12 Assistance Needed: +1                   End of Session PT - End of Session Equipment Utilized During Treatment: Gait  belt Activity Tolerance: Patient tolerated treatment well Patient left: in bed;with call bell/phone within reach   Felecia Shelling  PTA [facility]  Acute  Rehab Pager     [phone]

## 2012-12-12 NOTE — ED Provider Notes (Addendum)
History     CSN: [id number]  Arrival date & time 12/10/12  1037   First MD Initiated Contact with Patient 12/10/12 1046      Chief Complaint  Patient presents with  . Shortness of Breath    HPI 78 year old female presents with chief complaint of shortness of breath which been getting worse over the last few days.  Patient has history of congestive heart failure along with COPD.  She also has previous history of pneumonia and MI.  Patient denies chest pain or lower extr she has had nocturnal dyspneaemity edema. Past Medical History  Diagnosis Date  . Hypertension   . Peripheral arterial disease   . Cognitive disorder   . Esophageal stricture     prior dilatation in June 2013  . Osteoarthritis   . Anxiety   . UTI (lower urinary tract infection) June 2013  . COPD (chronic obstructive pulmonary disease)     acute respiratory failure November 2013 with PNA  . NSTEMI (non-ST elevated myocardial infarction) November 2013    felt to be due to demand ischemia; managed conservatively; not a candidate for invasive testing due to general frailty  . IBS (irritable bowel syndrome)   . Acute combined systolic and diastolic HF (heart failure)     EF is 40 to 45% per echo November 2013  . Anemia     uncertain etiology  . Dementia   . Anxiety   . Sacral decubitus ulcer     Past Surgical History  Procedure Date  . Femoral bypass   . Esophagogastroduodenoscopy 06/21/2012    Procedure: ESOPHAGOGASTRODUODENOSCOPY (EGD);  Surgeon: Theda Belfast, MD;  Location: [facility];  Service: Endoscopy;  Laterality: N/A;    Family History  Problem Relation Age of Onset  . Coronary artery disease Father   . Coronary artery disease Mother   . Hypertension Mother   . Breast cancer Maternal Grandmother   . Breast cancer Maternal Aunt     History  Substance Use Topics  . Smoking status: Former Smoker    Quit date: 12/24/1996  . Smokeless tobacco: Never Used     Comment: per dtr, pt quit  tobacco in 1998.  Marland Kitchen Alcohol Use: No    OB History    Grav Para Term Preterm Abortions TAB SAB Ect Mult Living                  Review of Systems All other systems reviewed and are negative Allergies  Altace; Ambien; Codeine; Demerol; Penicillins; Sulfa antibiotics; and Versed  Home Medications  No current outpatient prescriptions on file.  BP 139/53  Pulse 71  Temp 98 F (36.7 C) (Oral)  Resp 22  Ht 5\' 1"  (1.549 m)  Wt 88 lb 6.5 oz (40.1 kg)  BMI 16.70 kg/m2  SpO2 94%  Physical Exam  Nursing note and vitals reviewed. Constitutional: She is oriented to person, place, and time. She appears well-developed and well-nourished. No distress.  HENT:  Head: Normocephalic and atraumatic.  Eyes: Pupils are equal, round, and reactive to light.  Neck: Normal range of motion. JVD present.  Cardiovascular: Normal rate and intact distal pulses.   Pulmonary/Chest: She is in respiratory distress (Mild). She has rales.  Abdominal: Normal appearance. She exhibits no distension. There is no tenderness. There is no rebound and no guarding.  Musculoskeletal: Normal range of motion. She exhibits no edema.  Neurological: She is alert and oriented to person, place, and time. No cranial nerve deficit.  Skin:  Skin is warm and dry. No rash noted.  Psychiatric: She has a normal mood and affect. Her behavior is normal.    ED Course  Procedures (including critical care time)   CRITICAL CARE Performed by: Nelva Nay L   Total critical care time: 30 min  Critical care time was exclusive of separately billable procedures and treating other patients.  Critical care was necessary to treat or prevent imminent or life-threatening deterioration.  Critical care was time spent personally by me on the following activities: development of treatment plan with patient and/or surrogate as well as nursing, discussions with consultants, evaluation of patient's response to treatment, examination of  patient, obtaining history from patient or surrogate, ordering and performing treatments and interventions, ordering and review of laboratory studies, ordering and review of radiographic studies, pulse oximetry and re-evaluation of patient's condition.   Date: 12/12/2012  Rate: 85  Rhythm: normal sinus rhythm  QRS Axis: normal  Intervals: normal  ST/T Wave abnormalities: T wave inversion in inferior lateral leads which appears on previous EKG  Conduction Disutrbances: none  Narrative Interpretation: Abnormal EKG      Labs Reviewed  COMPREHENSIVE METABOLIC PANEL - Abnormal; Notable for the following:    Glucose, Bld 116 (*)     Creatinine, Ser 1.15 (*)     Albumin 2.9 (*)     GFR calc non Af Amer 41 (*)     GFR calc Af Amer 48 (*)     All other components within normal limits  PRO B NATRIURETIC PEPTIDE - Abnormal; Notable for the following:    Pro B Natriuretic peptide (BNP) 47088.0 (*)     All other components within normal limits  CBC WITH DIFFERENTIAL - Abnormal; Notable for the following:    RBC 3.77 (*)     Hemoglobin 9.8 (*)     HCT 31.6 (*)     Neutrophils Relative 78 (*)     All other components within normal limits  BASIC METABOLIC PANEL - Abnormal; Notable for the following:    CO2 35 (*)     Creatinine, Ser 1.38 (*)     GFR calc non Af Amer 33 (*)     GFR calc Af Amer 38 (*)     All other components within normal limits  CBC - Abnormal; Notable for the following:    RBC 3.83 (*)     Hemoglobin 9.9 (*)     HCT 31.9 (*)     MCH 25.8 (*)     All other components within normal limits  BASIC METABOLIC PANEL - Abnormal; Notable for the following:    Chloride 92 (*)     CO2 37 (*)     BUN 30 (*)     Creatinine, Ser 1.40 (*)     GFR calc non Af Amer 32 (*)     GFR calc Af Amer 38 (*)     All other components within normal limits  CBC - Abnormal; Notable for the following:    Hemoglobin 9.9 (*)     HCT 31.9 (*)     MCH 25.4 (*)     All other components within  normal limits  POCT I-STAT TROPONIN I  URINALYSIS, MICROSCOPIC ONLY  TROPONIN I  TROPONIN I  TROPONIN I  MRSA PCR SCREENING   Dg Chest Portable 1 View  12/10/2012  *RADIOLOGY REPORT*  Clinical Data: Shortness of breath.  PORTABLE CHEST - 1 VIEW  Comparison: 10/20/2012.  CT scan from 11/05/2012  Findings: The cardiopericardial silhouette is enlarged. Interstitial markings are diffusely coarsened with chronic features.  There is some persistent airspace disease at the left base. Imaged bony structures of the thorax are intact. Telemetry leads overlie the chest.  IMPRESSION: Cardiomegaly with advanced underlying chronic interstitial lung disease.  Persistent retrocardiac left base airspace opacity.   Original Report Authenticated By: Kennith Center, M.D.      1. Congestive heart failure   2. Anemia due to chronic illness   3. Anxiety   4. ARF (acute renal failure)   5. Acute on chronic combined systolic and diastolic heart failure       MDM         Nelia Shi, MD 12/12/12 4098  Nelia Shi, MD 12/12/12 [phone]

## 2012-12-12 NOTE — Progress Notes (Signed)
Patient ID: [name], female   DOB: Dec 09, 1922, 78 y.o.   MRN: [id number]  [facility] PROGRESS NOTE  [name] YNW:[id number] DOB: Sep 12, 1922 DOA: 12/10/2012 PCP: [name], MD  HPI:  Pt is 78 yo female with history of CHF and NSTEMI who presents to [facility] ED with main concern of progressively worsening shortness of breath especially with exertion that initially started several days prior to this admission. She explains she get very short of breath even trying to walk small distances at home, to the bathroom but also reports anxiety attacks associated with SOB. She has been recently hospitalized for necrotizing PNA as she explains and has had difficulty getting back to her baseline. She has poor oral intake and 2 pillow orthopnea, she denies chest pain or lower extremity swelling. She also denies fevers, chills, abdominal or urinary concerns. She denies weight gain.  Assessment and Plan:  Principal Problem:  *Acute on Chronic respiratory failure with hypoxia  - this is likely multifactorial in etiology and secondary to mild CHF exacerbation imposed on COPD  - per 2 D ECHO systolic function reduced to 35%  - pt clinically improving - daily weight and I's/O's  Active Problems:  Decompensated COPD with exacerbation (chronic obstructive pulmonary disease)  - management with oxygen and nebulizers as needed  ARF (acute renal failure)  - creatinine is trending up with diuresis  - plan to transition to PO Lasix today and will continue to monitor clinical response  - BMP in AM  Systolic and diastolic CHF, acute on chronic  - management as noted above  - follow upon renal functions in the setting of diuresis  Physical deconditioning  - PT evaluation  Anemia due to chronic illness  - Hg and Hct are at baseline  - CBC in AM  Hypertension  - continue Metoprolol and Lasix as noted above  Consultants:  Cardiology Procedures/Studies:  Dg Chest Portable 1 View  12/10/2012  Cardiomegaly  with advanced underlying chronic interstitial lung disease. Persistent retrocardiac left base airspace opacity.  Antibiotics: None  Code Status:DNR  Family Communication: Pt at bedside, daughter over the phone  Disposition Plan: Plan d/c in AM   HPI/Subjective: No events overnight.   Objective: Filed Vitals:   12/11/12 2100 12/12/12 0540 12/12/12 1002 12/12/12 1344  BP: 128/50 139/53 137/46 149/50  Pulse: 76 71 72 75  Temp:  98 F (36.7 C)  98 F (36.7 C)  TempSrc:  Oral  Oral  Resp: 24 22  20   Height:      Weight:  40.1 kg (88 lb 6.5 oz)    SpO2: 94% 94%  93%    Intake/Output Summary (Last 24 hours) at 12/12/12 1428 Last data filed at 12/12/12 0906  Gross per 24 hour  Intake    360 ml  Output   1200 ml  Net   -840 ml    Exam:   General:  Pt is alert, follows commands appropriately, not in acute distress  Cardiovascular: Regular rate and rhythm, S1/S2, no murmurs, no rubs, no gallops  Respiratory: Clear to auscultation bilaterally, decreased breath sounds at bases  Abdomen: Soft, non tender, non distended, bowel sounds present, no guarding  Extremities: No edema, pulses DP and PT palpable bilaterally  Neuro: Grossly nonfocal  Data Reviewed: Basic Metabolic Panel:  Lab 12/12/12 6578 12/11/12 0244 12/10/12 1105  NA 136 136 139  K 3.7 5.1 4.7  CL 92* 96 103  CO2 37* 35* 27  GLUCOSE 91 95 116*  BUN 30* 23 23  CREATININE 1.40* 1.38* 1.15*  CALCIUM 9.0 9.7 9.5  MG -- -- --  PHOS -- -- --   Liver Function Tests:  Lab 12/10/12 1105  AST 36  ALT 24  ALKPHOS 78  BILITOT 0.3  PROT 6.7  ALBUMIN 2.9*   No results found for this basename: LIPASE:5,AMYLASE:5 in the last 168 hours No results found for this basename: AMMONIA:5 in the last 168 hours CBC:  Lab 12/12/12 0445 12/11/12 0244 12/10/12 1105  WBC 6.7 9.2 6.4  NEUTROABS -- -- 5.0  HGB 9.9* 9.9* 9.8*  HCT 31.9* 31.9* 31.6*  MCV 81.8 83.3 83.8  PLT 288 293 297   Cardiac Enzymes:  Lab 12/11/12  0244 12/10/12 2059 12/10/12 1520  CKTOTAL -- -- --  CKMB -- -- --  CKMBINDEX -- -- --  TROPONINI <0.30 <0.30 <0.30   BNP: No components found with this basename: POCBNP:5 CBG: No results found for this basename: GLUCAP:5 in the last 168 hours  Recent Results (from the past 240 hour(s))  MRSA PCR SCREENING     Status: Normal   Collection Time   12/10/12  5:44 PM      Component Value Range Status Comment   MRSA by PCR NEGATIVE  NEGATIVE Final      Scheduled Meds:   . aspirin  81 mg Oral Daily  . clopidogrel  75 mg Oral QAC breakfast  . donepezil  5 mg Oral QHS  . enoxaparin  30 mg Subcutaneous Q24H  . famotidine  20 mg Oral Daily  . feeding supplement  237 mL Oral BID BM  . furosemide  20 mg Oral Daily  . metoprolol tartrate  25 mg Oral BID  . mometasone-formoterol  2 puff Inhalation BID  . nitroGLYCERIN  0.5 inch Topical Q6H  . sodium chloride  2 spray Nasal q morning - 10a  . sodium chloride  3 mL Intravenous Q12H   Continuous Infusions:    Debbora Presto, MD  [facility] Pager [phone]  If 7PM-7AM, please contact night-coverage [email or web address] Password [email or web address] 12/12/2012, 2:28 PM   LOS: 2 days

## 2012-12-12 NOTE — Progress Notes (Signed)
Patient transferred to [address]. CSW will follow for return to [facility].  Markesia Crilly C. Magie Ciampa MSW, LCSW [phone]

## 2012-12-12 NOTE — ED Notes (Signed)
ED chart done  Nelia Shi, MD 12/12/12 [phone]

## 2012-12-12 NOTE — Care Management Note (Unsigned)
    Page 1 of 2   12/12/2012     4:16:09 PM   CARE MANAGEMENT NOTE 12/12/2012  Patient:  [name], [name]   Account Number:  [id number]  Date Initiated:  12/11/2012  Documentation initiated by:  DAVIS,RHONDA  Subjective/Objective Assessment:   pt with hx of chf,and recent nstemi, presents with increased sob,     Action/Plan:   lives at [facility]   Anticipated DC Date:  12/14/2012   Anticipated DC Plan:  HOME W HOME HEALTH SERVICES  In-house referral  NA      DC Planning Services  CM consult      [facility] Choice  NA   Choice offered to / List presented to:  C-1 Patient   DME arranged  NA      DME agency  NA     HH arranged  NA      HH agency  NA   Status of service:  In process, will continue to follow Medicare Important Message given?  NA - LOS <3 / Initial given by admissions (If response is "NO", the following Medicare IM given date fields will be blank) Date Medicare IM given:   Date Additional Medicare IM given:    Discharge Disposition:    Per UR Regulation:  Reviewed for med. necessity/level of care/duration of stay  If discussed at Long Length of Stay Meetings, dates discussed:    Comments:  12/12/12 Shiori Adcox RN,BSN NCM [phone] PT/OT-HH.PROVIDED [facility] AGENCY LIST.  [id number] Earlene Plater, RN, BSN, CCM: CHART REVIEWED AND UPDATED.  Next chart review due on [id number]. NO DISCHARGE NEEDS PRESENT AT THIS TIME. CASE MANAGEMENT [phone]

## 2012-12-13 LAB — BASIC METABOLIC PANEL
GFR calc non Af Amer: 37 mL/min — ABNORMAL LOW (ref >90)
Glucose, Bld: 86 mg/dL (ref 70–99)
Potassium: 3.8 mEq/L (ref 3.5–5.1)
Sodium: 136 mEq/L (ref 135–145)

## 2012-12-13 LAB — CBC
Hemoglobin: 10 g/dL — ABNORMAL LOW (ref 12.0–15.0)
MCH: 26.2 pg (ref 26.0–34.0)
RBC: 3.82 MIL/uL — ABNORMAL LOW (ref 3.87–5.11)

## 2012-12-13 MED ORDER — ALPRAZOLAM 0.25 MG PO TABS: 0.5000 mg | ORAL_TABLET | Freq: Two times a day (BID) | ORAL | Status: DC

## 2012-12-13 MED ORDER — ALPRAZOLAM 0.25 MG PO TABS: 0.2500 mg | ORAL_TABLET | Freq: Two times a day (BID) | ORAL | Status: DC

## 2012-12-13 MED ORDER — ALPRAZOLAM 0.25 MG PO TABS: 0.2500 mg | ORAL_TABLET | Freq: Two times a day (BID) | ORAL | Status: DC | PRN

## 2012-12-13 MED ORDER — ALPRAZOLAM 0.5 MG PO TABS: 0.5000 mg | ORAL_TABLET | Freq: Two times a day (BID) | ORAL | Status: DC

## 2012-12-13 MED ORDER — FUROSEMIDE 20 MG PO TABS: ORAL_TABLET | ORAL | Status: DC

## 2012-12-13 MED ORDER — NITROGLYCERIN 2 % TD OINT: 0.5000 [in_us] | TOPICAL_OINTMENT | Freq: Four times a day (QID) | TRANSDERMAL | Status: DC

## 2012-12-13 NOTE — Progress Notes (Signed)
[facility]. Assessment unchanged from am.  

## 2012-12-13 NOTE — Progress Notes (Signed)
Per Pt's daughter, Pt's Xanax is not correctly prescribed.  RN to contact MD to discuss this.  ___________  Per RN, Pt's medications correct, at this time and MD made an addendum to Pt's d/c summary.  Updated FL2.  Attempted to notify Osborne Casco at facility.  Informed that Osborne Casco was gone for the day.  Instructed to fax the new information and to go ahead and send Pt.  Faxed updated FL2 and d/c summary.  Pt's daughter to provide transportation.  Pt to be d/c'd.  Providence Crosby, LCSWA Clinical Social Work [phone] '

## 2012-12-13 NOTE — Discharge Summary (Addendum)
Physician Discharge Summary  Billie Trager WUJ:[id number] DOB: 10-24-1922 DOA: 12/10/2012  PCP: [name], MD  Admit date: 12/10/2012 Discharge date: 12/13/2012  Recommendations for Outpatient Follow-up:  1. Pt will need to follow up with PCP in 2-3 weeks post discharge 2. Pt will also need to see cardiologist in 1 week post discharge 3. Pt was discharged on same dose of Lasix as per prior home regimen 20 mg PO QD  4. Recommendation per cardiologist was to add extra dose of Lasix 20 mg PO if needed for worsening shortness of breath and weight gain > 2-3 lbs 5. Please note that pt is on chronic oxygen and will continue to use is as per prior recommendations  6. Please obtain BMP to evaluate electrolytes and kidney function 7. Please also check CBC to evaluate Hg and Hct levels 8. Please also note that pt's baseline weight during the hospital stay was 89-91 lbs 9. Please ensure that pt receives her first dose of medications before 8 am  Discharge Diagnoses: Acute respiratory failure secondary to combined systolic and diastolic CHF exacerbation imposed on COPD Principal Problem:  *Chronic respiratory failure with hypoxia Active Problems:  Decompensated COPD with exacerbation (chronic obstructive pulmonary disease)  ARF (acute renal failure)  Systolic and diastolic CHF, acute on chronic  Physical deconditioning  Anemia due to chronic illness  Hypertension  Acute on chronic combined systolic and diastolic heart failure  Discharge Condition: Stable  Diet recommendation: Heart healthy diet discussed in details   HPI:  Pt is 78 yo female with history of CHF and NSTEMI who presents to [facility] ED with main concern of progressively worsening shortness of breath especially with exertion that initially started several days prior to this admission. She explains she get very short of breath even trying to walk small distances at home, to the bathroom but also reports anxiety attacks associated with  SOB. She has been recently hospitalized for necrotizing PNA as she explains and has had difficulty getting back to her baseline. She has poor oral intake and 2 pillow orthopnea, she denies chest pain or lower extremity swelling. She also denies fevers, chills, abdominal or urinary concerns. She denies weight gain.  Assessment and Plan:  Principal Problem:  *Acute on Chronic respiratory failure with hypoxia  - this is likely multifactorial in etiology and secondary to combined systolic and diastolic CHF exacerbation imposed on COPD  - per 2 D ECHO systolic function reduced to 35% with persistent grade I diastolic dysfunction - pt clinically improving and with non crackles on exam, no lower extremity swelling - daily weight and I's/O's monitor during the hospital stay - baseline weight per hospital record 89-91 lbs Active Problems:  Decompensated COPD with exacerbation (chronic obstructive pulmonary disease)  - management with oxygen and nebulizers as needed  ARF (acute renal failure)  - creatinine is trending down on current dose of Lasix 20 mg PO QD - this dose will be continue upon discharge with plan to added extra dose of Lasix 20 mg PO as needed as noted above Systolic and diastolic CHF, acute on chronic  - management as noted above  - follow upon renal functions in the setting of diuresis  Physical deconditioning  - PT evaluation done and PT continued while inpatient  Anemia due to chronic illness  - Hg and Hct are at baseline  Hypertension  - continue Metoprolol and Lasix as noted above  Consultants:  Cardiology Procedures/Studies:  Dg Chest Portable 1 View  12/10/2012  Cardiomegaly with  advanced underlying chronic interstitial lung disease. Persistent retrocardiac left base airspace opacity.  Antibiotics: None  Code Status:DNR  Family Communication: Pt at bedside, daughter over the phone   Discharge Exam: Filed Vitals:   12/13/12 0549  BP: 109/54  Pulse: 73  Temp: 97.9  F (36.6 C)  Resp: 18   Filed Vitals:   12/12/12 1344 12/12/12 2039 12/12/12 2100 12/13/12 0549  BP: 149/50  140/51 109/54  Pulse: 75  73 73  Temp: 98 F (36.7 C)   97.9 F (36.6 C)  TempSrc: Oral   Oral  Resp: 20   18  Height:      Weight:    41.1 kg (90 lb 9.7 oz)  SpO2: 93% 95%  98%    General: Pt is alert, follows commands appropriately, not in acute distress Cardiovascular: Regular rate and rhythm, S1/S2 +, no murmurs, no rubs, no gallops Respiratory: Clear to auscultation bilaterally, no wheezing, no crackles, no rhonchi Abdominal: Soft, non tender, non distended, bowel sounds +, no guarding Extremities: no edema, no cyanosis, pulses palpable bilaterally DP and PT Neuro: Grossly nonfocal  Discharge Instructions  Discharge Orders    Future Orders Please Complete By Expires   Diet - low sodium heart healthy      Increase activity slowly          Medication List     As of 12/13/2012  9:54 AM    TAKE these medications         albuterol 108 (90 BASE) MCG/ACT inhaler   Commonly known as: PROVENTIL HFA;VENTOLIN HFA   Inhale 2 puffs into the lungs every 4 (four) hours as needed. Wheezing/shortness of breath.      albuterol (2.5 MG/3ML) 0.083% nebulizer solution   Commonly known as: PROVENTIL   Take 2.5 mg by nebulization 3 (three) times daily.      ALPRAZolam 0.50 MG tablet   Commonly known as: XANAX   Take 1 tablet by mouth 2 (two) times daily.       aspirin 81 MG chewable tablet   Chew 81 mg by mouth daily.      Carboxymethylcellul-Glycerin 0.5-0.9 % Soln   Place 1 drop into both eyes 4 (four) times daily as needed. For dry eyes      clopidogrel 75 MG tablet   Commonly known as: PLAVIX   Take 75 mg by mouth daily.      donepezil 5 MG tablet   Commonly known as: ARICEPT   Take 5 mg by mouth at bedtime.      feeding supplement Liqd   Take 237 mLs by mouth 2 (two) times daily between meals.      Fluticasone-Salmeterol 100-50 MCG/DOSE Aepb   Commonly  known as: ADVAIR   Inhale 1 puff into the lungs every 12 (twelve) hours.      furosemide 20 MG tablet   Commonly known as: LASIX   Take 20 mg by mouth daily.      furosemide 20 MG tablet   Commonly known as: LASIX   Take 20 mg tablet as needed daily for worsening shortness of breath and in the setting of weight gain over 2-3 lbs or worsening lower extremity edema      MAPAP 325 MG tablet   Generic drug: acetaminophen   Take 650 mg by mouth 3 (three) times daily as needed. For pain      Melatonin 1 MG Tabs   Take 1 mg by mouth at bedtime.  metoprolol tartrate 25 MG tablet   Commonly known as: LOPRESSOR   Take 25 mg by mouth 2 (two) times daily.      promethazine 12.5 MG tablet   Commonly known as: PHENERGAN   Take 12.5 mg by mouth every 6 (six) hours as needed. For nausea/vomiting.      ranitidine 150 MG tablet   Commonly known as: ZANTAC   Take 150 mg by mouth at bedtime.      sodium chloride 0.65 % Soln nasal spray   Commonly known as: OCEAN   Place 2 sprays into the nose every morning. May keep at bedside.      traMADol 50 MG tablet   Commonly known as: ULTRAM   Take 50 mg by mouth every 6 (six) hours as needed. For pain.      Vitamin D 1000 UNITS capsule   Take 1,000 Units by mouth daily.           Follow-up Information    Follow up with [name], MD. In 1 week.   Contact information:   [address] [place] [address] [phone]       Follow up with Elyn Aquas., MD. In 1 week.   Contact information:   [address] [place] [address] [phone]           The results of significant diagnostics from this hospitalization (including imaging, microbiology, ancillary and laboratory) are listed below for reference.     Microbiology: Recent Results (from the past 240 hour(s))  MRSA PCR SCREENING     Status: Normal   Collection Time   12/10/12  5:44 PM      Component Value Range Status Comment   MRSA by  PCR NEGATIVE  NEGATIVE Final      Labs: Basic Metabolic Panel:  Lab 12/13/12 7253 12/12/12 0445 12/11/12 0244 12/10/12 1105  NA 136 136 136 139  K 3.8 3.7 5.1 4.7  CL 94* 92* 96 103  CO2 34* 37* 35* 27  GLUCOSE 86 91 95 116*  BUN 29* 30* 23 23  CREATININE 1.25* 1.40* 1.38* 1.15*  CALCIUM 9.2 9.0 9.7 9.5  MG -- -- -- --  PHOS -- -- -- --   Liver Function Tests:  Lab 12/10/12 1105  AST 36  ALT 24  ALKPHOS 78  BILITOT 0.3  PROT 6.7  ALBUMIN 2.9*   CBC:  Lab 12/13/12 0457 12/12/12 0445 12/11/12 0244 12/10/12 1105  WBC 6.3 6.7 9.2 6.4  NEUTROABS -- -- -- 5.0  HGB 10.0* 9.9* 9.9* 9.8*  HCT 31.6* 31.9* 31.9* 31.6*  MCV 82.7 81.8 83.3 83.8  PLT 263 288 293 297   Cardiac Enzymes:  Lab 12/11/12 0244 12/10/12 2059 12/10/12 1520  CKTOTAL -- -- --  CKMB -- -- --  CKMBINDEX -- -- --  TROPONINI <0.30 <0.30 <0.30   BNP: BNP (last 3 results)  Basename 12/10/12 1105 11/27/12 0947 11/12/12 1316  PROBNP 47088.0* 1813.0* 1697.0*     SIGNED: Time coordinating discharge: Over 30 minutes  Debbora Presto, MD  [facility] 12/13/2012, 9:54 AM Pager [phone]  If 7PM-7AM, please contact night-coverage [email or web address] Password [email or web address]

## 2012-12-15 ENCOUNTER — Telehealth: Payer: Self-pay | Admitting: Cardiovascular Disease

## 2012-12-15 NOTE — Telephone Encounter (Signed)
Scheduler will set app due to type of insurance.

## 2012-12-15 NOTE — Telephone Encounter (Signed)
New Problem:    Patient's daughter called in wanting the patient to be seen by Dr. [name] Hashimoto this week.  Please call back.

## 2012-12-15 NOTE — Telephone Encounter (Signed)
Left msg to call back and told her Dr [name] Hashimoto is out of office but we may be able to have her seen by another provider if needed.

## 2012-12-25 ENCOUNTER — Telehealth: Payer: Self-pay | Admitting: Cardiovascular Disease

## 2012-12-25 NOTE — Telephone Encounter (Signed)
Pt had and 12/26/12 pt with Norma Fredrickson it was can canceled by provider (out of office).    Pt's daughter Vikic adv'd pt was released form the hosp. 12/13/12 and 12/26/12 appt was the follow up.   Daughter is saying pt is still having problems and needs to be seen.  Please call daughter back to adv'd when pt can be seen.   Thank you.

## 2012-12-25 NOTE — Telephone Encounter (Signed)
Pt/ daughter informed we will see who can see her tomorrow and will call her back. Daughter agreed to plan.

## 2012-12-26 ENCOUNTER — Encounter: Payer: Self-pay | Admitting: Adult Health

## 2012-12-26 ENCOUNTER — Ambulatory Visit (INDEPENDENT_AMBULATORY_CARE_PROVIDER_SITE_OTHER): Payer: Medicare Other | Admitting: Adult Health

## 2012-12-26 ENCOUNTER — Encounter: Payer: Medicare Other | Admitting: Nurse Practitioner

## 2012-12-26 VITALS — BP 153/64 | HR 87 | Ht 60.0 in | Wt 90.1 lb

## 2012-12-26 DIAGNOSIS — I10 Essential (primary) hypertension: Secondary | ICD-10-CM

## 2012-12-26 DIAGNOSIS — D638 Anemia in other chronic diseases classified elsewhere: Secondary | ICD-10-CM

## 2012-12-26 DIAGNOSIS — I5043 Acute on chronic combined systolic (congestive) and diastolic (congestive) heart failure: Secondary | ICD-10-CM

## 2012-12-26 LAB — CBC WITH DIFFERENTIAL/PLATELET
Basophils Relative: 0.3 % (ref 0.0–3.0)
Eosinophils Absolute: 0.2 10*3/uL (ref 0.0–0.7)
HCT: 33.4 % — ABNORMAL LOW (ref 36.0–46.0)
Hemoglobin: 10.5 g/dL — ABNORMAL LOW (ref 12.0–15.0)
Lymphs Abs: 0.6 10*3/uL — ABNORMAL LOW (ref 0.7–4.0)
MCHC: 31.5 g/dL (ref 30.0–36.0)
MCV: 79.5 fl (ref 78.0–100.0)
Monocytes Absolute: 0.4 10*3/uL (ref 0.1–1.0)
Neutro Abs: 5.7 10*3/uL (ref 1.4–7.7)
RBC: 4.2 Mil/uL (ref 3.87–5.11)

## 2012-12-26 LAB — BASIC METABOLIC PANEL
BUN: 23 mg/dL (ref 6–23)
CO2: 34 mEq/L — ABNORMAL HIGH (ref 19–32)
CO2: 32 mEq/L (ref 19–32)
Calcium: 9.4 mg/dL (ref 8.4–10.5)
Calcium: 9.3 mg/dL (ref 8.4–10.5)
Chloride: 100 mEq/L (ref 96–112)
GFR: 29.13 mL/min — ABNORMAL LOW (ref >60.00)
Glucose, Bld: 125 mg/dL — ABNORMAL HIGH (ref 70–99)
Glucose, Bld: 122 mg/dL — ABNORMAL HIGH (ref 70–99)
Potassium: 4.3 mEq/L (ref 3.5–5.1)
Sodium: 140 mEq/L (ref 135–145)

## 2012-12-26 MED ORDER — MAGNESIUM 200 MG PO TABS: 200.0000 mg | ORAL_TABLET | Freq: Every day | ORAL | Status: DC

## 2012-12-26 NOTE — Progress Notes (Signed)
HPI: [name] is a pleasant 79 y/o patient of Dr. [name] Hashimoto we are seeing for ongoing assessment and treatment of Systolic Dysfunction with EF of 40-45%, CAD, PAD, she also has history of  CKD stage III and mild dementia. She comes today s/p hospitalization for exacerbation of COPD, NSTEMI in the setting of demand ischemia  with CHF exacerbation in December of 2013.  She was diuresed and treated with O2 and nebulizers. She was taken to PT for a short period due to deconditioning.. She has now returned to Assisted living facility and is doing well, She continues to have periods of "hot flashes followed by sudden onset of chills": when she over exerts herself. She is complaining today of leg cramps that are keeping her up at night sometimes. Otherwise, she remains active.   Allergies  Allergen Reactions  . Altace (Ramipril) Other (See Comments)    Reaction unknown  . Ambien (Zolpidem Tartrate) Other (See Comments)    Reaction unknown  . Codeine Other (See Comments)    Reaction unknown  . Demerol (Meperidine) Other (See Comments)    Reaction unknown  . Penicillins Other (See Comments)    Reaction unknown  . Sulfa Antibiotics Other (See Comments)    Reaction unknown  . Versed (Midazolam) Other (See Comments)    Reaction unknown      Past Medical History  Diagnosis Date  . Hypertension   . Peripheral arterial disease   . Cognitive disorder   . Esophageal stricture     prior dilatation in June 2013  . Osteoarthritis   . Anxiety   . UTI (lower urinary tract infection) June 2013  . COPD (chronic obstructive pulmonary disease)     acute respiratory failure November 2013 with PNA  . NSTEMI (non-ST elevated myocardial infarction) November 2013    felt to be due to demand ischemia; managed conservatively; not a candidate for invasive testing due to general frailty  . IBS (irritable bowel syndrome)   . Acute combined systolic and diastolic HF (heart failure)     EF is 40 to 45% per echo  November 2013  . Anemia     uncertain etiology  . Dementia   . Anxiety   . Sacral decubitus ulcer     Past Surgical History  Procedure Date  . Femoral bypass   . Esophagogastroduodenoscopy 06/21/2012    Procedure: ESOPHAGOGASTRODUODENOSCOPY (EGD);  Surgeon: Theda Belfast, MD;  Location: [facility];  Service: Endoscopy;  Laterality: N/A;    ZOX:WRUEAV of systems complete and found to be negative unless listed above  PHYSICAL EXAM BP 153/64  Pulse 87  Ht 5' (1.524 m)  Wt 90 lb 1.9 oz (40.878 kg)  BMI 17.60 kg/m2  General: Well developed, well nourished, in no acute distress Head: Eyes PERRLA, No xanthomas.   Normal cephalic and atramatic  Lungs: Clear bilaterally to auscultation and percussion. Heart: HRRR S1 S2, distant  without MRG.  Pulses are 2+ & equal.            No carotid bruit. No JVD.  No abdominal bruits. No femoral bruits. Abdomen: Bowel sounds are positive, abdomen soft and non-tender without masses or                  Hernia's noted. Msk:  Back normal, normal gait. Normal strength and tone for age. Extremities: Positive for clubbing, neg for  cyanosis or edema.  DP +1 Several sites of old ecchymosis on arms and legs. She uses a  walker for ambulation Neuro: Alert and oriented X 3. Psych:  Good affect, responds appropriatelyEKG:  EKG: NSR with LVH rates of 90 bpm  ASSESSMENT AND PLAN

## 2012-12-26 NOTE — Patient Instructions (Addendum)
**Note De-Identified  Obfuscation** Your physician recommends that you return for lab work in: today  Your physician has recommended you make the following change in your medication: start taking Magnesium 200 mg daily  Your physician recommends that you schedule a follow-up appointment in: 1 month  Your provider recommends that you start a low sodium diet  2 Gram Low Sodium Diet A 2 gram sodium diet restricts the amount of sodium in the diet to no more than 2 g or 2000 mg daily. Limiting the amount of sodium is often used to help lower blood pressure. It is important if you have heart, liver, or kidney problems. Many foods contain sodium for flavor and sometimes as a preservative. When the amount of sodium in a diet needs to be low, it is important to know what to look for when choosing foods and drinks. The following includes some information and guidelines to help make it easier for you to adapt to a low sodium diet. QUICK TIPS  Do not add salt to food.  Avoid convenience items and fast food.  Choose unsalted snack foods.  Buy lower sodium products, often labeled as "lower sodium" or "no salt added."  Check food labels to learn how much sodium is in 1 serving.  When eating at a restaurant, ask that your food be prepared with less salt or none, if possible. READING FOOD LABELS FOR SODIUM INFORMATION The nutrition facts label is a good place to find how much sodium is in foods. Look for products with no more than 500 to 600 mg of sodium per meal and no more than 150 mg per serving. Remember that 2 g = 2000 mg. The food label may also list foods as:  Sodium-free: Less than 5 mg in a serving.  Very low sodium: 35 mg or less in a serving.  Low-sodium: 140 mg or less in a serving.  Light in sodium: 50% less sodium in a serving. For example, if a food that usually has 300 mg of sodium is changed to become light in sodium, it will have 150 mg of sodium.  Reduced sodium: 25% less sodium in a serving. For example, if a  food that usually has 400 mg of sodium is changed to reduced sodium, it will have 300 mg of sodium. CHOOSING FOODS Grains  Avoid: Salted crackers and snack items. Some cereals, including instant hot cereals. Bread stuffing and biscuit mixes. Seasoned rice or pasta mixes.  Choose: Unsalted snack items. Low-sodium cereals, oats, puffed wheat and rice, shredded wheat. English muffins and bread. Pasta. Meats  Avoid: Salted, canned, smoked, spiced, pickled meats, including fish and poultry. Bacon, ham, sausage, cold cuts, hot dogs, anchovies.  Choose: Low-sodium canned tuna and salmon. Fresh or frozen meat, poultry, and fish. Dairy  Avoid: Processed cheese and spreads. Cottage cheese. Buttermilk and condensed milk. Regular cheese.  Choose: Milk. Low-sodium cottage cheese. Yogurt. Sour cream. Low-sodium cheese. Fruits and Vegetables  Avoid: Regular canned vegetables. Regular canned tomato sauce and paste. Frozen vegetables in sauces. Olives. Rosita Fire. Relishes. Sauerkraut.  Choose: Low-sodium canned vegetables. Low-sodium tomato sauce and paste. Frozen or fresh vegetables. Fresh and frozen fruit. Condiments  Avoid: Canned and packaged gravies. Worcestershire sauce. Tartar sauce. Barbecue sauce. Soy sauce. Steak sauce. Ketchup. Onion, garlic, and table salt. Meat flavorings and tenderizers.  Choose: Fresh and dried herbs and spices. Low-sodium varieties of mustard and ketchup. Lemon juice. Tabasco sauce. Horseradish. SAMPLE 2 GRAM SODIUM MEAL PLAN Breakfast / Sodium (mg)  1 cup low-fat milk / 143 **Note De-Identified  Obfuscation** mg  2 slices whole-wheat toast / 270 mg  1 tbs heart-healthy margarine / 153 mg  1 hard-boiled egg / 139 mg  1 small orange / 0 mg Lunch / Sodium (mg)  1 cup raw carrots / 76 mg   cup hummus / 298 mg  1 cup low-fat milk / 143 mg   cup red grapes / 2 mg  1 whole-wheat pita bread / 356 mg Dinner / Sodium (mg)  1 cup whole-wheat pasta / 2 mg  1 cup low-sodium tomato sauce / 73  mg  3 oz lean ground beef / 57 mg  1 small side salad (1 cup raw spinach leaves,  cup cucumber,  cup yellow bell pepper) with 1 tsp olive oil and 1 tsp red wine vinegar / 25 mg Snack / Sodium (mg)  1 container low-fat vanilla yogurt / 107 mg  3 graham cracker squares / 127 mg Nutrient Analysis  Calories: 2033  Protein: 77 g  Carbohydrate: 282 g  Fat: 72 g  Sodium: 1971 mg Document Released: 12/10/2005 Document Revised: 03/03/2012 Document Reviewed: 03/13/2010 [facility] Patient Information 2013 [place], [place].

## 2012-12-26 NOTE — Assessment & Plan Note (Addendum)
She appears to be euvolemic on this assessment. No complaints of leg cramps or DOE. Will not make any changes at this time. She is to continue to weigh herself daily and report wt gain of 2-3 lbs in 1-2 days or 5 lbs in one week.  She is to continue the low sodium diet to limit total to 1500 mg daily. I believe that she will come in under this as she has been diligent about no added salt and reading labels. She will have a BMET drawn today prior to leaving the office. She is complaining of leg cramps. I will begin Magnesium 200mg  daily. . Follow up with Dr.Nahser  in one month.

## 2012-12-26 NOTE — Assessment & Plan Note (Addendum)
Blood pressure is a little elevated on this visit but will not change any medications at this time. She is to adhere to low sodium diet. She will follow up in one month.

## 2013-01-28 ENCOUNTER — Ambulatory Visit (INDEPENDENT_AMBULATORY_CARE_PROVIDER_SITE_OTHER): Payer: Medicare Other | Admitting: Cardiovascular Disease

## 2013-01-28 ENCOUNTER — Encounter: Payer: Self-pay | Admitting: Cardiovascular Disease

## 2013-01-28 VITALS — BP 124/60 | HR 71 | Ht 60.0 in | Wt 91.1 lb

## 2013-01-28 DIAGNOSIS — I509 Heart failure, unspecified: Secondary | ICD-10-CM

## 2013-01-28 DIAGNOSIS — I5043 Acute on chronic combined systolic (congestive) and diastolic (congestive) heart failure: Secondary | ICD-10-CM

## 2013-01-28 MED ORDER — FUROSEMIDE 20 MG PO TABS: ORAL_TABLET | ORAL | Status: DC

## 2013-01-28 MED ORDER — NITROGLYCERIN 0.4 MG SL SUBL: 0.4000 mg | SUBLINGUAL_TABLET | SUBLINGUAL | Status: DC | PRN

## 2013-01-28 NOTE — Patient Instructions (Addendum)
Your physician recommends that you return for lab work in: today bmet  Your physician has recommended you make the following change in your medication:   Decrease lasix to 20 mg m/w/f/and Saturday.  You have nitro SL for break through pain and SOB 0.4 mg One tablet under the tongue for chest pain, may take another in 5 minutes if chest pain continues, may take up to 3 times. If pain continues call 911, this med may cause headache and or low blood pressure  Your physician wants you to follow-up in: 6 months You will receive a reminder letter in the mail two months in advance. If you don't receive a letter, please call our office to schedule the follow-up appointment.

## 2013-01-28 NOTE — Assessment & Plan Note (Signed)
[name] has a fairly difficult situation. She is now 79 years old and has clear-cut evidence of systolic congestive heart failure as well as diastolic congestive heart failure. She's been started on Lasix but now has started developing renal insufficiency. Her most recent creatinine is 1.8 which relates to a GFR of 29. Her creatinine in December, 2013 was 1.2.  She has a dry mouth. She has markedly decreased skin turgor and I suspect that she is intravascularly volume depleted. We have refill and basic metabolic profile today.  We will decrease her Lasix to 20 mg on Mondays, Wednesdays, Fridays, and Saturdays. My hope is that her renal function will improve. We'll have her try to eat less salt in her diet. She has been given a nitroglycerin patch and I have also given her a prescription for sublingual nitroglycerin to take when she has the sudden episodes of shortness breath which I suspect may be due to episodes of coronary ischemia or perhaps sudden decompensation of congestive heart failure.  Have had a long discussion with the daughter it and it made it clear that it is quite possible that we'll not be able to complete resolve these issues. [name] is now 79 years old and has a complex cardiac situation and is not a candidate for any invasive or interventional procedures.  I think we should concentrate on her comfort and safety.

## 2013-01-28 NOTE — Progress Notes (Signed)
[name] Date of Birth  October 13, 1922       [facility]    [place] [address]  [address], [address] [place], [place]  [address]   [place], [place]  [address] [phone]     [phone]   Fax  [phone]    Fax [phone]  Problem List: 1. CAD ( presumed) 2. PAD 3. Acute respiratory failure, acute necrotizing pneumonia 4.COPD 5. CHF - EF 40-45% 6.  CKD - stage 3 7. Dementia  History of Present Illness:  [name] is an 79 yo who was recently in the hospital for pneumonia, mild CHF, COPD, and dementia.  She came with her daughter this am.  She is feeling much better.  Denies any chest pain or worsening of her dyspnea.   Feb. 5, 2014:  She is here today with her daughter , [name]. She was in the hospital in mid December for acute dyspnea - was found to have acute CHF.  She responded to IV lasix .   Her cardiac enzymes were negative this time.  She had a hospitalization in October which resulted in mild elevations of her Troponin levels.     She has continued to have frequent episodes of dyspnea.  These occur suddenly and last 10 minutes.   They are described as a hot, smothering senation - associated with profuse diaphoresis.    She is eating at her nursing home - the diet at times it is fairly salty.  She has had a progressive rise in her creatinine over the past several months ( associated with her Lasix therapy).  She also complains of having a dry mouth ever since starting Lasix therapy.   Current Outpatient Prescriptions on File Prior to Visit  Medication Sig Dispense Refill  . acetaminophen (MAPAP) 325 MG tablet Take 650 mg by mouth 3 (three) times daily as needed. For pain      . albuterol (PROVENTIL) (2.5 MG/3ML) 0.083% nebulizer solution Take 2.5 mg by nebulization 3 (three) times daily.      Marland Kitchen ALPRAZolam (XANAX) 0.5 MG tablet Take 1 tablet (0.5 mg total) by mouth 2 (two) times daily. For anxiety  60 tablet  0  . aspirin 81  MG chewable tablet Chew 81 mg by mouth daily.      . Carboxymethylcellul-Glycerin 0.5-0.9 % SOLN Place 1 drop into both eyes 4 (four) times daily as needed. For dry eyes       . Cholecalciferol (VITAMIN D) 1000 UNITS capsule Take 1,000 Units by mouth daily.      . clopidogrel (PLAVIX) 75 MG tablet Take 75 mg by mouth daily.      Marland Kitchen dipyridamole (PERSANTINE) 50 MG tablet Take 100 mg by mouth 2 (two) times daily.      Marland Kitchen donepezil (ARICEPT) 5 MG tablet Take 5 mg by mouth at bedtime.      . feeding supplement (ENSURE COMPLETE) LIQD Take 237 mLs by mouth 2 (two) times daily between meals.      . Fluticasone-Salmeterol (ADVAIR) 100-50 MCG/DOSE AEPB Inhale 1 puff into the lungs every 12 (twelve) hours.      . Magnesium 200 MG TABS Take 1 tablet (200 mg total) by mouth daily.  30 tablet  3  . Melatonin 1 MG TABS Take 1 mg by mouth at bedtime.      . metoprolol tartrate (LOPRESSOR) 25 MG tablet Take 25 mg by mouth 2 (two) times daily.      Marland Kitchen  mirtazapine (REMERON) 15 MG tablet Take 7.5 mg by mouth at bedtime.      . pravastatin (PRAVACHOL) 20 MG tablet Take 20 mg by mouth daily.      . promethazine (PHENERGAN) 12.5 MG tablet Take 12.5 mg by mouth every 6 (six) hours as needed. For nausea/vomiting.      . ranitidine (ZANTAC) 150 MG tablet Take 150 mg by mouth at bedtime.      . sodium chloride (OCEAN) 0.65 % SOLN nasal spray Place 2 sprays into the nose every morning. May keep at bedside.      . traMADol (ULTRAM) 50 MG tablet Take 50 mg by mouth every 6 (six) hours as needed. For pain.        Allergies  Allergen Reactions  . Altace (Ramipril) Other (See Comments)    Reaction unknown  . Ambien (Zolpidem Tartrate) Other (See Comments)    Reaction unknown  . Codeine Other (See Comments)    Reaction unknown  . Demerol (Meperidine) Other (See Comments)    Reaction unknown  . Penicillins Other (See Comments)    Reaction unknown  . Sulfa Antibiotics Other (See Comments)    Reaction unknown  . Versed  (Midazolam) Other (See Comments)    Reaction unknown    Past Medical History  Diagnosis Date  . Hypertension   . Peripheral arterial disease   . Cognitive disorder   . Esophageal stricture     prior dilatation in June 2013  . Osteoarthritis   . Anxiety   . UTI (lower urinary tract infection) June 2013  . COPD (chronic obstructive pulmonary disease)     acute respiratory failure November 2013 with PNA  . NSTEMI (non-ST elevated myocardial infarction) November 2013    felt to be due to demand ischemia; managed conservatively; not a candidate for invasive testing due to general frailty  . IBS (irritable bowel syndrome)   . Acute combined systolic and diastolic HF (heart failure)     EF is 40 to 45% per echo November 2013  . Anemia     uncertain etiology  . Dementia   . Anxiety   . Sacral decubitus ulcer     Past Surgical History  Procedure Date  . Femoral bypass   . Esophagogastroduodenoscopy 06/21/2012    Procedure: ESOPHAGOGASTRODUODENOSCOPY (EGD);  Surgeon: Theda Belfast, MD;  Location: [facility];  Service: Endoscopy;  Laterality: N/A;    History  Smoking status  . Former Smoker  . Quit date: 12/24/1996  Smokeless tobacco  . Never Used    Comment: per dtr, pt quit tobacco in 1998.    History  Alcohol Use No    Family History  Problem Relation Age of Onset  . Coronary artery disease Father   . Coronary artery disease Mother   . Hypertension Mother   . Breast cancer Maternal Grandmother   . Breast cancer Maternal Aunt     Reviw of Systems:  Reviewed in the HPI.  All other systems are negative.  Physical Exam: Blood pressure 124/60, pulse 71, height 5' (1.524 m), weight 91 lb 1.9 oz (41.332 kg), SpO2 93.00%. General: Chronically ill-appearing, frail, elderly female  Head: Normocephalic, atraumatic, sclera non-icteric, mucus membranes in her mouth are dry.  Neck: Supple. Carotids are 2 + without bruits. No JVD   Lungs: Clear , slightly decreased  breath sounds left base  Heart: RR  Abdomen: Soft, non-tender, non-distended with normal bowel sounds.  Msk:  Strength and tone are normal  Extremities: No clubbing or cyanosis. No edema.   Decreased skin turgur.   Distal pedal pulses are 2+ and equal   Neuro: CN II - XII intact.  Alert and oriented X 3.   Psych:  Normal   ECG:  Assessment / Plan:

## 2013-01-29 LAB — BASIC METABOLIC PANEL
BUN: 20 mg/dL (ref 6–23)
CO2: 39 mEq/L — ABNORMAL HIGH (ref 19–32)
Calcium: 9.1 mg/dL (ref 8.4–10.5)
Creatinine, Ser: 1.5 mg/dL — ABNORMAL HIGH (ref 0.4–1.2)

## 2013-03-03 ENCOUNTER — Other Ambulatory Visit: Payer: Self-pay | Admitting: *Deleted

## 2013-03-03 MED ORDER — FUROSEMIDE 20 MG PO TABS: ORAL_TABLET | ORAL | Status: DC

## 2013-03-03 NOTE — Telephone Encounter (Signed)
Patient request refill

## 2013-07-17 ENCOUNTER — Telehealth: Payer: Self-pay | Admitting: Cardiovascular Disease

## 2013-07-17 NOTE — Telephone Encounter (Signed)
7/25 followed up with patient's daughter's [name] and was told pt is doing much better with the help of the [facility], daughter will call as needed.    OB

## 2013-07-17 NOTE — Telephone Encounter (Signed)
Noted  

## 2013-07-17 NOTE — Telephone Encounter (Signed)
7/25 called patient's Daughter Schmidt,Vicki and was informed patient is doing better with the help of the Nitro pad and will call as needed.     ob

## 2013-07-17 NOTE — Telephone Encounter (Signed)
noted 

## 2013-07-29 ENCOUNTER — Other Ambulatory Visit: Payer: Self-pay

## 2013-10-29 ENCOUNTER — Other Ambulatory Visit: Payer: Self-pay

## 2013-11-04 ENCOUNTER — Encounter: Payer: Self-pay | Admitting: Internal Medicine

## 2013-11-04 ENCOUNTER — Ambulatory Visit (INDEPENDENT_AMBULATORY_CARE_PROVIDER_SITE_OTHER): Payer: Medicare Other | Admitting: Internal Medicine

## 2013-11-04 VITALS — BP 142/70 | HR 70 | Temp 98.2°F | Ht 59.0 in | Wt 90.0 lb

## 2013-11-04 DIAGNOSIS — J438 Other emphysema: Secondary | ICD-10-CM

## 2013-11-04 DIAGNOSIS — J439 Emphysema, unspecified: Secondary | ICD-10-CM

## 2013-11-04 MED ORDER — PANTOPRAZOLE SODIUM 40 MG PO TBEC: 40.0000 mg | DELAYED_RELEASE_TABLET | Freq: Every day | ORAL | Status: DC

## 2013-11-04 MED ORDER — PREDNISONE (PAK) 10 MG PO TABS: ORAL_TABLET | ORAL | Status: DC

## 2013-11-04 MED ORDER — ALBUTEROL SULFATE (2.5 MG/3ML) 0.083% IN NEBU: INHALATION_SOLUTION | RESPIRATORY_TRACT | Status: DC

## 2013-11-04 NOTE — Progress Notes (Signed)
Chief Complaint  Patient presents with  . Follow-up    Pt reports that she gets pretty winded at night even when wearing o2--daughter would like to know how long she needs to be on nebs and if singulair is needed bc seems like it is causing runny nose    History of Present Illness: [name] is a 79 y.o. female with COPD/emphysema, and chronic hypoxic respiratory failure.  She was in hospital in October 2013  for pneumonia.  She resides in [facility].     Her breathing has been doing better.  She is still using 3 liters oxygen 24/7.  She uses advair twice per day.  She is not sure how much this helps.  She uses singulair daily.  She was started on this for nasal congestion, but it hasn't helped.  She has been using albuterol nebulizer three times per day.  She does this because she was told to, and not because of symptoms. rec Stop singulair Continue advair one puff twice per day, and rinse mouth after each use Albuterol one vial nebulized up to 4 times per day as needed for cough, wheeze, or chest congestion Albuterol inhaler two puffs up to 4 times per day as needed for cough, wheeze, or chest congestion Use 2 liters oxygen with exertion and sleep.  You don't need to use oxygen at rest      11/04/2013 f/u ov/[name] re: chronic cough and sob.  Chief Complaint  Patient presents with  . Acute Visit    Pt c/o increased cough x 1 month- prod with moderate clear to white sputum. Sometimes wakes her up at night.   worse sob x months assoc worse cough x one month, more day than night  rx  Zpak 11/04/13   No obvious day to day or daytime variabilty or assoc   cp or chest tightness, subjective wheeze overt sinus or hb symptoms. No unusual exp hx or h/o childhood pna/ asthma or knowledge of premature birth.  Sleeping ok without nocturnal  or early am exacerbation  of respiratory  c/o's or need for noct saba. Also denies any obvious fluctuation of symptoms with weather or  environmental changes or other aggravating or alleviating factors except as outlined above   Current Medications, Allergies, Complete Past Medical History, Past Surgical History, Family History, and Social History were reviewed in [employer] record.  ROS  The following are not active complaints unless bolded sore throat, dysphagia, dental problems, itching, sneezing,  nasal congestion or excess/ purulent secretions, ear ache,   fever, chills, sweats, unintended wt loss, pleuritic or exertional cp, hemoptysis,  orthopnea pnd or leg swelling, presyncope, palpitations, heartburn, abdominal pain, anorexia, nausea, vomiting, diarrhea  or change in bowel or urinary habits, change in stools or urine, dysuria,hematuria,  rash, arthralgias, visual complaints, headache, numbness weakness or ataxia or problems with walking or coordination,  change in mood/affect or memory.       .   TESTS: 10/16/12 Echo >> mod LVH, EF 40 to 45%, grade 1 diastolic dysfx 11/05/12 CT chest >> no PE, small b/l effusions, emphysema, basilar ATX 12/02/12 Spirometry >> FEV1 0.65 (47%), FEV1% 40  Past Medical History  Diagnosis Date  . Hypertension   . Peripheral arterial disease   . Cognitive disorder   . Esophageal stricture     prior dilatation in June 2013  . Osteoarthritis   . Anxiety   . UTI (lower urinary tract infection) June 2013  . COPD (  chronic obstructive pulmonary disease)     acute respiratory failure November 2013 with PNA  . NSTEMI (non-ST elevated myocardial infarction) November 2013    felt to be due to demand ischemia; managed conservatively; not a candidate for invasive testing due to general frailty  . IBS (irritable bowel syndrome)   . Acute combined systolic and diastolic HF (heart failure)     EF is 40 to 45% per echo November 2013  . Anemia     uncertain etiology  . Dementia   . Anxiety   . Sacral decubitus ulcer        Physical Exam:  Pleasant amb wf nad dry  raspy cough      Wt Readings from Last 3 Encounters:  11/04/13 90 lb (40.824 kg)  01/28/13 91 lb 1.9 oz (41.332 kg)  12/26/12 90 lb 1.9 oz (40.878 kg)       General - Thin, no distress, wearing oxygen ENT - No sinus tenderness, no oral exudate, no LAN Cardiac - s1s2 regular, no murmur Chest - Prolonged exhalation, decreased breath sounds, no wheeze Back - No focal tenderness Abd - Soft, non-tender Ext - No edema Neuro - Normal strength Skin - No rashes Psych - normal mood, and behavior   Assessment/Plan:

## 2013-11-04 NOTE — Patient Instructions (Addendum)
Pantoprazole (protonix) 40 mg   Take 30-60 min before first meal of the day    Stop advair and spiriva  Increase the neb back to four times daily   Prednisone 10 mg take  4 each am x 2 days,   2 each am x 2 days,  1 each am x 2 days and stop   Robitussin DM or mucinex dm max dose 1200 mg every 12 hours  and supplement if needed with tramadol 50 mg every  Hours as needed for cough or pain or headache   GERD (REFLUX)  is an extremely common cause of respiratory symptoms, many times with no significant heartburn at all.    It can be treated with medication, but also with lifestyle changes including avoidance of late meals, excessive alcohol, smoking cessation, and avoid fatty foods, chocolate, peppermint, colas, red wine, and acidic juices such as orange juice.  NO MINT OR MENTHOL PRODUCTS SO NO COUGH DROPS  USE SUGARLESS CANDY INSTEAD (jolley ranchers or Stover's)  NO OIL BASED VITAMINS - use powdered substitutes.   Please schedule a follow up office visit in 2 weeks, sooner if needed with Dr Craige Cotta or Babette Relic NP or me   .

## 2013-11-06 NOTE — Assessment & Plan Note (Signed)
DDX of  difficult airways managment all start with A and  include Adherence, Ace Inhibitors, Acid Reflux, Active Sinus Disease, Alpha 1 Antitripsin deficiency, Anxiety masquerading as Airways dz,  ABPA,  allergy(esp in young), Aspiration (esp in elderly), Adverse effects of DPI,  Active smokers, plus two Bs  = Bronchiectasis and Beta blocker use..and one C= CHF  Adherence is always the initial "prime suspect" and is a multilayered concern that requires a "trust but verify" approach in every patient - starting with knowing how to use medications, especially inhalers, correctly, keeping up with refills and understanding the fundamental difference between maintenance and prns vs those medications only taken for a very short course and then stopped and not refilled.   ? Acid (or non-acid) GERD > always difficult to exclude as up to 75% of pts in some series report no assoc GI/ Heartburn symptoms> rec max (24h)  acid suppression and diet restrictions/ reviewed and instructions given in writting   ? Adverse effects from multiple dpi's > try off x 2 weeks and just use the neb qid until sort out the cause of her coughing in 2 weeks    Each maintenance medication was reviewed in detail including most importantly the difference between maintenance and as needed and under what circumstances the prns are to be used.  Please see instructions for details which were reviewed in writing and the patient given a copy.

## 2013-11-10 ENCOUNTER — Other Ambulatory Visit: Payer: Self-pay | Admitting: Internal Medicine

## 2013-11-10 MED ORDER — TRAMADOL HCL 50 MG PO TABS: ORAL_TABLET | ORAL | Status: DC

## 2013-11-17 ENCOUNTER — Encounter: Payer: Self-pay | Admitting: Critical Care Medicine

## 2013-11-17 ENCOUNTER — Ambulatory Visit (INDEPENDENT_AMBULATORY_CARE_PROVIDER_SITE_OTHER): Payer: Medicare Other | Admitting: Critical Care Medicine

## 2013-11-17 VITALS — BP 126/84 | HR 102 | Temp 98.3°F | Ht 60.0 in | Wt 90.2 lb

## 2013-11-17 DIAGNOSIS — J438 Other emphysema: Secondary | ICD-10-CM

## 2013-11-17 DIAGNOSIS — J439 Emphysema, unspecified: Secondary | ICD-10-CM

## 2013-11-17 DIAGNOSIS — J441 Chronic obstructive pulmonary disease with (acute) exacerbation: Secondary | ICD-10-CM

## 2013-11-17 NOTE — Progress Notes (Deleted)
Subjective:    Patient ID: [name], female    DOB: 08-27-1922, 79 y.o.   MRN: [id number]  HPI Chief Complaint  Patient presents with  . Follow-up    Pt reports that she gets pretty winded at night even when wearing o2--daughter would like to know how long she needs to be on nebs and if singulair is needed bc seems like it is causing runny nose    History of Present Illness: [name] is a 79 y.o. female with COPD/emphysema, and chronic hypoxic respiratory failure.  She was in hospital in October 2013  for pneumonia.  She resides in [facility].     Her breathing has been doing better.  She is still using 3 liters oxygen 24/7.  She uses advair twice per day.  She is not sure how much this helps.  She uses singulair daily.  She was started on this for nasal congestion, but it hasn't helped.  She has been using albuterol nebulizer three times per day.  She does this because she was told to, and not because of symptoms. rec Stop singulair Continue advair one puff twice per day, and rinse mouth after each use Albuterol one vial nebulized up to 4 times per day as needed for cough, wheeze, or chest congestion Albuterol inhaler two puffs up to 4 times per day as needed for cough, wheeze, or chest congestion Use 2 liters oxygen with exertion and sleep.  You don't need to use oxygen at rest      10/2013 f/u ov/Wert re: chronic cough and sob. worse sob x months assoc worse cough x one month, more day than night  rx  Zpak 11/04/13   No obvious day to day or daytime variabilty or assoc   cp or chest tightness, subjective wheeze overt sinus or hb symptoms. No unusual exp hx or h/o childhood pna/ asthma or knowledge of premature birth.  Sleeping ok without nocturnal  or early am exacerbation  of respiratory  c/o's or need for noct saba. Also denies any obvious fluctuation of symptoms with weather or environmental changes or other aggravating or alleviating factors except as  outlined above   11/17/2013 Chief Complaint  Patient presents with  . Follow-up    fu per MW.  Stopped spiriva and advair, since stopping cough has improved  Hx of URI and chronic cough  Pt got spiriva and advair and irritated pt and stopped this last ov. Was to see NP but never got on NP schedule. Also on Zpak.    Dyspnea is better off spiriva and advair. Min slimy mucus, this is less now   Current Medications, Allergies, Complete Past Medical History, Past Surgical History, Family History, and Social History were reviewed in [employer] record.  ROS  The following are not active complaints unless bolded sore throat, dysphagia, dental problems, itching, sneezing,  nasal congestion or excess/ purulent secretions, ear ache,   fever, chills, sweats, unintended wt loss, pleuritic or exertional cp, hemoptysis,  orthopnea pnd or leg swelling, presyncope, palpitations, heartburn, abdominal pain, anorexia, nausea, vomiting, diarrhea  or change in bowel or urinary habits, change in stools or urine, dysuria,hematuria,  rash, arthralgias, visual complaints, headache, numbness weakness or ataxia or problems with walking or coordination,  change in mood/affect or memory.       .   TESTS: 10/16/12 Echo >> mod LVH, EF 40 to 45%, grade 1 diastolic dysfx 11/05/12 CT chest >> no PE, small b/l effusions, emphysema,  basilar ATX 12/02/12 Spirometry >> FEV1 0.65 (47%), FEV1% 40  Past Medical History  Diagnosis Date  . Hypertension   . Peripheral arterial disease   . Cognitive disorder   . Esophageal stricture     prior dilatation in June 2013  . Osteoarthritis   . Anxiety   . UTI (lower urinary tract infection) June 2013  . COPD (chronic obstructive pulmonary disease)     acute respiratory failure November 2013 with PNA  . NSTEMI (non-ST elevated myocardial infarction) November 2013    felt to be due to demand ischemia; managed conservatively; not a candidate for invasive  testing due to general frailty  . IBS (irritable bowel syndrome)   . Acute combined systolic and diastolic HF (heart failure)     EF is 40 to 45% per echo November 2013  . Anemia     uncertain etiology  . Dementia   . Anxiety   . Sacral decubitus ulcer        Physical Exam:  Pleasant amb wf nad dry raspy cough      Wt Readings from Last 3 Encounters:  11/17/13 90 lb 3.2 oz (40.914 kg)  11/04/13 90 lb (40.824 kg)  01/28/13 91 lb 1.9 oz (41.332 kg)       General - Thin, no distress, wearing oxygen ENT - No sinus tenderness, no oral exudate, no LAN Cardiac - s1s2 regular, no murmur Chest - Prolonged exhalation, decreased breath sounds, no wheeze Back - No focal tenderness Abd - Soft, non-tender Ext - No edema Neuro - Normal strength Skin - No rashes Psych - normal mood, and behavior   Assessment/Plan:          Review of Systems     Objective:   Physical Exam        Assessment & Plan:

## 2013-11-17 NOTE — Patient Instructions (Signed)
No change in medications. Return in      4 months with Dr Sherene Sires We will work on light weight portable oxygen system

## 2013-11-18 NOTE — Progress Notes (Signed)
Subjective:    Patient ID: [name], female    DOB: 11-05-1922, 79 y.o.   MRN: [id number]  HPI   Chief Complaint  Patient presents with  . Follow-up    Pt reports that she gets pretty winded at night even when wearing o2--daughter would like to know how long she needs to be on nebs and if singulair is needed bc seems like it is causing runny nose    History of Present Illness: [name] is a 79 y.o. female with COPD/emphysema, and chronic hypoxic respiratory failure.  She was in hospital in October 2013  for pneumonia.  She resides in [facility].     Her breathing has been doing better.  She is still using 3 liters oxygen 24/7.  She uses advair twice per day.  She is not sure how much this helps.  She uses singulair daily.  She was started on this for nasal congestion, but it hasn't helped.  She has been using albuterol nebulizer three times per day.  She does this because she was told to, and not because of symptoms. rec Stop singulair Continue advair one puff twice per day, and rinse mouth after each use Albuterol one vial nebulized up to 4 times per day as needed for cough, wheeze, or chest congestion Albuterol inhaler two puffs up to 4 times per day as needed for cough, wheeze, or chest congestion Use 2 liters oxygen with exertion and sleep.  You don't need to use oxygen at rest      10/2013 f/u ov/Wert re: chronic cough and sob. worse sob x months assoc worse cough x one month, more day than night  rx  Zpak 11/04/13   No obvious day to day or daytime variabilty or assoc   cp or chest tightness, subjective wheeze overt sinus or hb symptoms. No unusual exp hx or h/o childhood pna/ asthma or knowledge of premature birth.  Sleeping ok without nocturnal  or early am exacerbation  of respiratory  c/o's or need for noct saba. Also denies any obvious fluctuation of symptoms with weather or environmental changes or other aggravating or alleviating factors except  as outlined above   11/17/2013 Chief Complaint  Patient presents with  . Follow-up    fu per MW.  Stopped spiriva and advair, since stopping cough has improved  Hx of URI and chronic cough  Pt got spiriva and advair and irritated pt and stopped this last ov. Was to see NP but never got on NP schedule. Also on Zpak.    Dyspnea is better off spiriva and advair. Min slimy mucus, this is less now   Current Medications, Allergies, Complete Past Medical History, Past Surgical History, Family History, and Social History were reviewed in [employer] record.   Review of Systems Constitutional:   No  weight loss, night sweats,  Fevers, chills, fatigue, lassitude. HEENT:   No headaches,  Difficulty swallowing,  Tooth/dental problems,  Sore throat,                No sneezing, itching, ear ache, nasal congestion, post nasal drip,   CV:  No chest pain,  Orthopnea, PND, swelling in lower extremities, anasarca, dizziness, palpitations  GI  No heartburn, indigestion, abdominal pain, nausea, vomiting, diarrhea, change in bowel habits, loss of appetite  Resp: ++ shortness of breath with exertion not  at rest.  No excess mucus, no productive cough,  No non-productive cough,  No coughing up of  blood.  No change in color of mucus.  No wheezing.  No chest wall deformity  Skin: no rash or lesions.  GU: no dysuria, change in color of urine, no urgency or frequency.  No flank pain.  MS:  No joint pain or swelling.  No decreased range of motion.  No back pain.  Psych:  No change in mood or affect. No depression or anxiety.  No memory loss.     Objective:   Physical Exam Filed Vitals:   11/17/13 1544  BP: 126/84  Pulse: 102  Temp: 98.3 F (36.8 C)  TempSrc: Oral  Height: 5' (1.524 m)  Weight: 90 lb 3.2 oz (40.914 kg)  SpO2: 92%    Gen: Elderly white female in no distress,  normal affect  ENT: No lesions,  mouth clear,  oropharynx clear, no postnasal drip  Neck: No JVD,  no TMG, no carotid bruits  Lungs: No use of accessory muscles, no dullness to percussion, distant breath sounds  Cardiovascular: RRR, heart sounds normal, no murmur or gallops, no peripheral edema  Abdomen: soft and NT, no HSM,  BS normal  Musculoskeletal: No deformities, no cyanosis or clubbing  Neuro: alert, non focal  Skin: Warm, no lesions or rashes  No results found.        Assessment & Plan:   Decompensated COPD with exacerbation (chronic obstructive pulmonary disease) History of decompensated COPD with associated exacerbation now improved Note patient is markedly better using nebulized albuterol versus prior use of dry powdered inhalers Plan Maintain nebulized albuterol 4 times daily Maintain off Spiriva and Advair at this time   Updated Medication List Outpatient Encounter Prescriptions as of 11/17/2013  Medication Sig  . acetaminophen (MAPAP) 325 MG tablet Take 650 mg by mouth 3 (three) times daily as needed. For pain  . albuterol (PROVENTIL) (2.5 MG/3ML) 0.083% nebulizer solution One four times daily  . ALPRAZolam (XANAX) 0.5 MG tablet Take 1 tablet (0.5 mg total) by mouth 2 (two) times daily. For anxiety  . aspirin 81 MG chewable tablet Chew 81 mg by mouth daily.  . Carboxymethylcellul-Glycerin 0.5-0.9 % SOLN Place 1 drop into both eyes 4 (four) times daily as needed. For dry eyes   . Cholecalciferol (VITAMIN D) 1000 UNITS capsule Take 1,000 Units by mouth daily.  . clopidogrel (PLAVIX) 75 MG tablet Take 75 mg by mouth daily.  Marland Kitchen dipyridamole-aspirin (AGGRENOX) 200-25 MG per 12 hr capsule Take 1 capsule by mouth 2 (two) times daily.  Marland Kitchen donepezil (ARICEPT) 5 MG tablet Take 5 mg by mouth at bedtime.  . furosemide (LASIX) 20 MG tablet Take 20 mg on Monday, Wednesday,  Friday and Saturday.  Marland Kitchen guaiFENesin (MUCINEX) 600 MG 12 hr tablet Take 600 mg by mouth daily.   Marland Kitchen lidocaine (LIDODERM) 5 % Place 1 patch onto the skin daily. Remove & Discard patch within 12 hours or  as directed by MD  . Magnesium 200 MG TABS Take 1 tablet (200 mg total) by mouth daily.  . Melatonin 1 MG TABS Take 1 mg by mouth at bedtime.  . metoprolol tartrate (LOPRESSOR) 25 MG tablet Take 25 mg by mouth 2 (two) times daily.  . mirtazapine (REMERON) 15 MG tablet Take 7.5 mg by mouth at bedtime.  . nitroGLYCERIN (NITRODUR - DOSED IN MG/24 HR) 0.6 mg/hr Place 1 patch onto the skin daily.  . nitroGLYCERIN (NITROSTAT) 0.4 MG SL tablet Place 1 tablet (0.4 mg total) under the tongue every 5 (five) minutes as needed for chest  pain (for break through chest pain in addition to nitro patch).  . pantoprazole (PROTONIX) 40 MG tablet Take 1 tablet (40 mg total) by mouth daily. Take 30-60 min before first meal of the day  . pravastatin (PRAVACHOL) 10 MG tablet Take 10 mg by mouth daily.  . promethazine (PHENERGAN) 12.5 MG tablet Take 12.5 mg by mouth every 6 (six) hours as needed. For nausea/vomiting.  . ranitidine (ZANTAC) 150 MG tablet Take 150 mg by mouth at bedtime.  . sodium chloride (OCEAN) 0.65 % SOLN nasal spray Place 2 sprays into the nose every morning. May keep at bedside.  . traMADol (ULTRAM) 50 MG tablet 1 every 4 hrs as needed for pain or cough  . [DISCONTINUED] azithromycin (ZITHROMAX) 250 MG tablet Take 250 mg by mouth daily.  . [DISCONTINUED] predniSONE (STERAPRED UNI-PAK) 10 MG tablet Prednisone 10 mg take  4 each am x 2 days,   2 each am x 2 days,  1 each am x2days and stop

## 2013-11-18 NOTE — Assessment & Plan Note (Signed)
History of decompensated COPD with associated exacerbation now improved Note patient is markedly better using nebulized albuterol versus prior use of dry powdered inhalers Plan Maintain nebulized albuterol 4 times daily Maintain off Spiriva and Advair at this time

## 2014-01-04 ENCOUNTER — Ambulatory Visit: Payer: Medicare Other | Admitting: Pulmonary Disease

## 2014-03-04 ENCOUNTER — Encounter: Payer: Self-pay | Admitting: Pulmonary Disease

## 2014-03-04 ENCOUNTER — Ambulatory Visit (INDEPENDENT_AMBULATORY_CARE_PROVIDER_SITE_OTHER): Payer: Medicare Other | Admitting: Pulmonary Disease

## 2014-03-04 VITALS — BP 110/66 | HR 55 | Temp 98.1°F | Ht 60.0 in | Wt 90.0 lb

## 2014-03-04 DIAGNOSIS — J439 Emphysema, unspecified: Secondary | ICD-10-CM

## 2014-03-04 DIAGNOSIS — R0902 Hypoxemia: Secondary | ICD-10-CM

## 2014-03-04 DIAGNOSIS — J9610 Chronic respiratory failure, unspecified whether with hypoxia or hypercapnia: Secondary | ICD-10-CM

## 2014-03-04 DIAGNOSIS — J9611 Chronic respiratory failure with hypoxia: Secondary | ICD-10-CM

## 2014-03-04 DIAGNOSIS — J438 Other emphysema: Secondary | ICD-10-CM

## 2014-03-04 NOTE — Patient Instructions (Signed)
Follow up in 6 months 

## 2014-03-04 NOTE — Progress Notes (Signed)
Chief Complaint  Patient presents with  . COPD    Breahing is unchanged. Reports lots of congestion. Mucus is yellow in color. Denies SOB, chest tightness.     History of Present Illness: [name] is a 80 y.o. female with COPD/emphysema and chronic hypoxic respiratory failure.  I last saw her in December 2013.  In the interim she has been seen by Dr. Delford FieldWright and Dr. Sherene SiresWert.  She is using 2 liters oxygen 24/7.  She had her inhalers adjusted due to concern for upper airway irritation from dry powder inhalers.  Her throat irritation has improved.  She uses albuterol 3 to 4 times per day, and this helps.  She is not having leg swelling.  She gets occasional cough with clear sputum.  She denies fever, wheeze, or hemoptysis.  TESTS: 10/16/12 Echo >> mod LVH, EF 40 to 45%, grade 1 diastolic dysfx  11/05/12 CT chest >> no PE, small b/l effusions, emphysema, basilar ATX  12/02/12 Spirometry >> FEV1 0.65 (47%), FEV1% 40  [name]  has a past medical history of Hypertension; Peripheral arterial disease; Cognitive disorder; Esophageal stricture; Osteoarthritis; Anxiety; UTI (lower urinary tract infection) (June 2013); COPD (chronic obstructive pulmonary disease); NSTEMI (non-ST elevated myocardial infarction) (November 2013); IBS (irritable bowel syndrome); Acute combined systolic and diastolic HF (heart failure); Anemia; Dementia; Anxiety; and Sacral decubitus ulcer.  [name]  has past surgical history that includes Femoral bypass and Esophagogastroduodenoscopy (06/21/2012).  Prior to Admission medications   Medication Sig Start Date End Date Taking? Authorizing Provider  acetaminophen (MAPAP) 325 MG tablet Take 650 mg by mouth 3 (three) times daily as needed. For pain   Yes Historical Provider, MD  albuterol (PROVENTIL) (2.5 MG/3ML) 0.083% nebulizer solution One four times daily 11/04/13  Yes Nyoka CowdenMichael B Wert, MD  ALPRAZolam Prudy Feeler(XANAX) 0.5 MG tablet Take 1 tablet (0.5 mg total) by mouth 2 (two)  times daily. For anxiety 12/13/12  Yes Dorothea OgleIskra M Myers, MD  aspirin 81 MG chewable tablet Chew 81 mg by mouth daily.   Yes Historical Provider, MD  Carboxymethylcellul-Glycerin 0.5-0.9 % SOLN Place 1 drop into both eyes 4 (four) times daily as needed. For dry eyes    Yes Historical Provider, MD  Cholecalciferol (VITAMIN D) 1000 UNITS capsule Take 1,000 Units by mouth daily.   Yes Historical Provider, MD  clopidogrel (PLAVIX) 75 MG tablet Take 75 mg by mouth daily.   Yes Historical Provider, MD  dipyridamole-aspirin (AGGRENOX) 200-25 MG per 12 hr capsule Take 1 capsule by mouth 2 (two) times daily.   Yes Historical Provider, MD  donepezil (ARICEPT) 5 MG tablet Take 5 mg by mouth at bedtime.   Yes Historical Provider, MD  furosemide (LASIX) 20 MG tablet Take 20 mg on Monday, Wednesday,  Friday and Saturday. 03/03/13  Yes Vesta MixerPhilip J Nahser, MD  guaiFENesin (MUCINEX) 600 MG 12 hr tablet Take 600 mg by mouth daily.    Yes Historical Provider, MD  HYDROcodone-acetaminophen (NORCO/VICODIN) 5-325 MG per tablet Take 1 tablet by mouth every 12 (twelve) hours as needed for moderate pain.   Yes Historical Provider, MD  Magnesium 200 MG TABS Take 1 tablet (200 mg total) by mouth daily. 12/26/12  Yes Jodelle GrossKathryn M Lawrence, NP  Melatonin 1 MG TABS Take 1 mg by mouth at bedtime.   Yes Historical Provider, MD  metoprolol tartrate (LOPRESSOR) 25 MG tablet Take 25 mg by mouth 2 (two) times daily.   Yes Historical Provider, MD  mirtazapine (REMERON) 15 MG tablet Take  7.5 mg by mouth at bedtime.   Yes Historical Provider, MD  nitroGLYCERIN (NITROSTAT) 0.4 MG SL tablet Place 1 tablet (0.4 mg total) under the tongue every 5 (five) minutes as needed for chest pain (for break through chest pain in addition to nitro patch). 01/28/13  Yes Vesta Mixer, MD  pantoprazole (PROTONIX) 40 MG tablet Take 1 tablet (40 mg total) by mouth daily. Take 30-60 min before first meal of the day 11/04/13  Yes Nyoka Cowden, MD  pravastatin (PRAVACHOL)  10 MG tablet Take 10 mg by mouth daily.   Yes Historical Provider, MD  promethazine (PHENERGAN) 12.5 MG tablet Take 12.5 mg by mouth every 6 (six) hours as needed. For nausea/vomiting.   Yes Historical Provider, MD  ranitidine (ZANTAC) 150 MG tablet Take 150 mg by mouth at bedtime.   Yes Historical Provider, MD  sodium chloride (OCEAN) 0.65 % SOLN nasal spray Place 2 sprays into the nose every morning. May keep at bedside.   Yes Historical Provider, MD  traMADol (ULTRAM) 50 MG tablet 1 every 4 hrs as needed for pain or cough 11/10/13  Yes Nyoka Cowden, MD    Allergies  Allergen Reactions  . Altace [Ramipril] Other (See Comments)    Reaction unknown  . Ambien [Zolpidem Tartrate] Other (See Comments)    Reaction unknown  . Codeine Other (See Comments)    Reaction unknown  . Demerol [Meperidine] Other (See Comments)    Reaction unknown  . Penicillins Other (See Comments)    Reaction unknown  . Sulfa Antibiotics Other (See Comments)    Reaction unknown  . Versed [Midazolam] Other (See Comments)    Reaction unknown     Physical Exam:  General - No distress, thin, wearing oxygen ENT - No sinus tenderness, no oral exudate, no LAN, difficulty with hearing Cardiac - s1s2 regular, no murmur Chest - decreased breath sounds, no wheeze Back - No focal tenderness Abd - Soft, non-tender Ext - No edema Neuro - Normal strength Skin - No rashes Psych - normal mood, and behavior   Assessment/Plan:  Coralyn Helling, MD [place] [facility]/Sleep Pager:  [phone]

## 2014-03-05 NOTE — Assessment & Plan Note (Signed)
Continue 2 liters oxygen 24/7. 

## 2014-03-05 NOTE — Assessment & Plan Note (Signed)
Continue albuterol nebulizer therapy as needed.  She had difficulty with upper airway irritation with dry powder inhalers.

## 2014-03-20 ENCOUNTER — Emergency Department (HOSPITAL_COMMUNITY): Payer: Medicare Other

## 2014-03-20 ENCOUNTER — Encounter (HOSPITAL_COMMUNITY): Payer: Self-pay | Admitting: Emergency Medicine

## 2014-03-20 ENCOUNTER — Emergency Department (HOSPITAL_COMMUNITY)
Admission: EM | Admit: 2014-03-20 | Discharge: 2014-03-20 | Disposition: A | Discharge status: Home / Self Care | Payer: Medicare Other | Attending: Emergency Medicine | Admitting: Emergency Medicine

## 2014-03-20 DIAGNOSIS — Z79899 Other long term (current) drug therapy: Secondary | ICD-10-CM | POA: Insufficient documentation

## 2014-03-20 DIAGNOSIS — I5041 Acute combined systolic (congestive) and diastolic (congestive) heart failure: Secondary | ICD-10-CM | POA: Insufficient documentation

## 2014-03-20 DIAGNOSIS — Z8701 Personal history of pneumonia (recurrent): Secondary | ICD-10-CM | POA: Insufficient documentation

## 2014-03-20 DIAGNOSIS — Z87891 Personal history of nicotine dependence: Secondary | ICD-10-CM | POA: Insufficient documentation

## 2014-03-20 DIAGNOSIS — F0390 Unspecified dementia without behavioral disturbance: Secondary | ICD-10-CM | POA: Insufficient documentation

## 2014-03-20 DIAGNOSIS — I10 Essential (primary) hypertension: Secondary | ICD-10-CM | POA: Insufficient documentation

## 2014-03-20 DIAGNOSIS — F411 Generalized anxiety disorder: Secondary | ICD-10-CM | POA: Insufficient documentation

## 2014-03-20 DIAGNOSIS — R05 Cough: Secondary | ICD-10-CM

## 2014-03-20 DIAGNOSIS — Z7902 Long term (current) use of antithrombotics/antiplatelets: Secondary | ICD-10-CM | POA: Insufficient documentation

## 2014-03-20 DIAGNOSIS — Z862 Personal history of diseases of the blood and blood-forming organs and certain disorders involving the immune mechanism: Secondary | ICD-10-CM | POA: Insufficient documentation

## 2014-03-20 DIAGNOSIS — Z872 Personal history of diseases of the skin and subcutaneous tissue: Secondary | ICD-10-CM | POA: Insufficient documentation

## 2014-03-20 DIAGNOSIS — Z8719 Personal history of other diseases of the digestive system: Secondary | ICD-10-CM | POA: Insufficient documentation

## 2014-03-20 DIAGNOSIS — I252 Old myocardial infarction: Secondary | ICD-10-CM | POA: Insufficient documentation

## 2014-03-20 DIAGNOSIS — Z880 Allergy status to penicillin: Secondary | ICD-10-CM | POA: Insufficient documentation

## 2014-03-20 DIAGNOSIS — Z87440 Personal history of urinary (tract) infections: Secondary | ICD-10-CM | POA: Insufficient documentation

## 2014-03-20 DIAGNOSIS — M1990 Unspecified osteoarthritis, unspecified site: Secondary | ICD-10-CM | POA: Insufficient documentation

## 2014-03-20 DIAGNOSIS — Z7982 Long term (current) use of aspirin: Secondary | ICD-10-CM | POA: Insufficient documentation

## 2014-03-20 DIAGNOSIS — R059 Cough, unspecified: Secondary | ICD-10-CM

## 2014-03-20 DIAGNOSIS — J441 Chronic obstructive pulmonary disease with (acute) exacerbation: Secondary | ICD-10-CM | POA: Insufficient documentation

## 2014-03-20 LAB — BASIC METABOLIC PANEL
BUN: 17 mg/dL (ref 6–23)
CHLORIDE: 97 meq/L (ref 96–112)
CO2: 28 mEq/L (ref 19–32)
CREATININE: 1.16 mg/dL — AB (ref 0.50–1.10)
Calcium: 9.1 mg/dL (ref 8.4–10.5)
GFR calc non Af Amer: 40 mL/min — ABNORMAL LOW (ref >90)
GFR, EST AFRICAN AMERICAN: 47 mL/min — AB (ref >90)
Glucose, Bld: 104 mg/dL — ABNORMAL HIGH (ref 70–99)
Potassium: 3.8 mEq/L (ref 3.7–5.3)
Sodium: 138 mEq/L (ref 137–147)

## 2014-03-20 LAB — CBC WITH DIFFERENTIAL/PLATELET
BASOS PCT: 1 % (ref 0–1)
Basophils Absolute: 0 10*3/uL (ref 0.0–0.1)
EOS ABS: 0.2 10*3/uL (ref 0.0–0.7)
Eosinophils Relative: 3 % (ref 0–5)
HEMATOCRIT: 37.1 % (ref 36.0–46.0)
HEMOGLOBIN: 12.2 g/dL (ref 12.0–15.0)
Lymphocytes Relative: 24 % (ref 12–46)
Lymphs Abs: 1.5 10*3/uL (ref 0.7–4.0)
MCH: 30.5 pg (ref 26.0–34.0)
MCHC: 32.9 g/dL (ref 30.0–36.0)
MCV: 92.8 fL (ref 78.0–100.0)
MONO ABS: 0.5 10*3/uL (ref 0.1–1.0)
Monocytes Relative: 8 % (ref 3–12)
NEUTROS ABS: 4 10*3/uL (ref 1.7–7.7)
Neutrophils Relative %: 65 % (ref 43–77)
Platelets: 209 10*3/uL (ref 150–400)
RBC: 4 MIL/uL (ref 3.87–5.11)
RDW: 12.5 % (ref 11.5–15.5)
WBC: 6.2 10*3/uL (ref 4.0–10.5)

## 2014-03-20 MED ORDER — BENZONATATE 100 MG PO CAPS: 100.0000 mg | ORAL_CAPSULE | Freq: Three times a day (TID) | ORAL | Status: DC

## 2014-03-20 NOTE — Discharge Instructions (Signed)
Cough, Adult  A cough is a reflex. It helps you clear your throat and airways. A cough can help heal your body. A cough can last 2 or 3 weeks (acute) or may last more than 8 weeks (chronic). Some common causes of a cough can include an infection, allergy, or a cold. HOME CARE  Only take medicine as told by your doctor.  If given, take your medicines (antibiotics) as told. Finish them even if you start to feel better.  Use a cold steam vaporizer or humidier in your home. This can help loosen thick spit (secretions).  Sleep so you are almost sitting up (semi-upright). Use pillows to do this. This helps reduce coughing.  Rest as needed.  Stop smoking if you smoke. GET HELP RIGHT AWAY IF:  You have yellowish-white fluid (pus) in your thick spit.  Your cough gets worse.  Your medicine does not reduce coughing, and you are losing sleep.  You cough up blood.  You have trouble breathing.  Your pain gets worse and medicine does not help.  You have a fever. MAKE SURE YOU:   Understand these instructions.  Will watch your condition.  Will get help right away if you are not doing well or get worse. Document Released: 08/23/2011 Document Revised: 03/03/2012 Document Reviewed: 08/23/2011 Exit[employer] Patient Information 2014 [employer].  

## 2014-03-20 NOTE — ED Notes (Signed)
Pt states she has had a cough productive of yellow sputum for @ 1 week.  Pt denies increased SOB at rest (she has COPD and is always on 2L O2 [place]).  Pt denies N/V/D and fever.  Pt is coughing in triage.  Pt denies pain anywhere.

## 2014-03-20 NOTE — ED Provider Notes (Signed)
CSN: [id number]     Arrival date & time 03/20/14  1441 History  First MD Initiated Contact with Patient 03/20/14 1510     Chief Complaint  Patient presents with  . Cough    productive   Patient is a 80 y.o. female presenting with cough. The history is provided by the patient.  Cough Cough characteristics:  Productive Sputum characteristics:  Yellow Severity:  Severe Onset quality:  Gradual Duration:  1 week Timing:  Constant Smoker: no (Hx of COPD.)   Relieved by:  Nothing Worsened by:  Nothing tried Ineffective treatments:  Beta-agonist inhaler Associated symptoms: shortness of breath (when she has severe coughing spells)   Associated symptoms: no chest pain, no chills, no diaphoresis, no fever, no rash, no rhinorrhea, no sinus congestion, no sore throat and no wheezing     Past Medical History  Diagnosis Date  . Hypertension   . Peripheral arterial disease   . Cognitive disorder   . Esophageal stricture     prior dilatation in June 2013  . Osteoarthritis   . Anxiety   . UTI (lower urinary tract infection) June 2013  . COPD (chronic obstructive pulmonary disease)     acute respiratory failure November 2013 with PNA  . NSTEMI (non-ST elevated myocardial infarction) November 2013    felt to be due to demand ischemia; managed conservatively; not a candidate for invasive testing due to general frailty  . IBS (irritable bowel syndrome)   . Acute combined systolic and diastolic HF (heart failure)     EF is 40 to 45% per echo November 2013  . Anemia     uncertain etiology  . Dementia   . Anxiety   . Sacral decubitus ulcer   . Necrotic pneumonia    Past Surgical History  Procedure Laterality Date  . Femoral bypass    . Esophagogastroduodenoscopy  06/21/2012    Procedure: ESOPHAGOGASTRODUODENOSCOPY (EGD);  Surgeon: Theda Belfast, MD;  Location: [facility];  Service: Endoscopy;  Laterality: N/A;   Family History  Problem Relation Age of Onset  . Coronary artery disease  Father   . Coronary artery disease Mother   . Hypertension Mother   . Breast cancer Maternal Grandmother   . Breast cancer Maternal Aunt    History  Substance Use Topics  . Smoking status: Former Smoker    Quit date: 12/24/1996  . Smokeless tobacco: Never Used     Comment: per dtr, pt quit tobacco in 1998.  Marland Kitchen Alcohol Use: No   OB History   Grav Para Term Preterm Abortions TAB SAB Ect Mult Living                 Review of Systems  Constitutional: Negative for fever, chills and diaphoresis.  HENT: Negative for rhinorrhea and sore throat.   Respiratory: Positive for cough and shortness of breath (when she has severe coughing spells). Negative for wheezing.   Cardiovascular: Negative for chest pain.  Skin: Negative for rash.  All other systems reviewed and are negative.      Allergies  Altace; Ambien; Codeine; Demerol; Penicillins; Sulfa antibiotics; and Versed  Home Medications   Current Outpatient Rx  Name  Route  Sig  Dispense  Refill  . acetaminophen (MAPAP) 325 MG tablet   Oral   Take 650 mg by mouth 3 (three) times daily as needed. For pain         . albuterol (PROVENTIL) (2.5 MG/3ML) 0.083% nebulizer solution  One four times daily   75 mL      . ALPRAZolam (XANAX) 0.5 MG tablet   Oral   Take 1 tablet (0.5 mg total) by mouth 2 (two) times daily. For anxiety   60 tablet   0   . aspirin 81 MG chewable tablet   Oral   Chew 81 mg by mouth daily.         . Carboxymethylcellul-Glycerin 0.5-0.9 % SOLN   Both Eyes   Place 1 drop into both eyes 4 (four) times daily as needed. For dry eyes          . Cholecalciferol (VITAMIN D) 1000 UNITS capsule   Oral   Take 1,000 Units by mouth daily.         . clopidogrel (PLAVIX) 75 MG tablet   Oral   Take 75 mg by mouth daily.         Marland Kitchen. dipyridamole-aspirin (AGGRENOX) 200-25 MG per 12 hr capsule   Oral   Take 1 capsule by mouth 2 (two) times daily.         Marland Kitchen. donepezil (ARICEPT) 5 MG tablet    Oral   Take 5 mg by mouth at bedtime.         . furosemide (LASIX) 20 MG tablet      Take 20 mg on Monday, Wednesday,  Friday and Saturday.   20 tablet   5   . guaiFENesin (MUCINEX) 600 MG 12 hr tablet   Oral   Take 600 mg by mouth daily.          Marland Kitchen. HYDROcodone-acetaminophen (NORCO/VICODIN) 5-325 MG per tablet   Oral   Take 1 tablet by mouth every 12 (twelve) hours as needed for moderate pain.         . Melatonin 1 MG TABS   Oral   Take 1 mg by mouth at bedtime.         . metoprolol tartrate (LOPRESSOR) 25 MG tablet   Oral   Take 25 mg by mouth 2 (two) times daily.         . mirtazapine (REMERON) 15 MG tablet   Oral   Take 7.5 mg by mouth at bedtime.         . nitroGLYCERIN (NITROSTAT) 0.4 MG SL tablet   Sublingual   Place 1 tablet (0.4 mg total) under the tongue every 5 (five) minutes as needed for chest pain (for break through chest pain in addition to nitro patch).   25 tablet   5   . pravastatin (PRAVACHOL) 10 MG tablet   Oral   Take 10 mg by mouth daily.         . promethazine (PHENERGAN) 12.5 MG tablet   Oral   Take 12.5 mg by mouth every 6 (six) hours as needed. For nausea/vomiting.         . ranitidine (ZANTAC) 150 MG tablet   Oral   Take 150 mg by mouth at bedtime.         . sodium chloride (OCEAN) 0.65 % SOLN nasal spray   Nasal   Place 2 sprays into the nose every morning. May keep at bedside.         . traMADol (ULTRAM) 50 MG tablet      1 every 4 hrs as needed for pain or cough   40 tablet   2    BP 127/73  Pulse 65  Temp(Src) 98.2 F (36.8 C) (Oral)  Resp 16  Wt 83 lb (37.649 kg)  SpO2 97% Physical Exam  Nursing note and vitals reviewed. Constitutional: No distress.  Frail, elderly  HENT:  Head: Normocephalic and atraumatic.  Right Ear: External ear normal.  Left Ear: External ear normal.  Eyes: Conjunctivae are normal. Right eye exhibits no discharge. Left eye exhibits no discharge. No scleral icterus.  Neck:  Neck supple. No tracheal deviation present.  Cardiovascular: Normal rate, regular rhythm and intact distal pulses.   Pulmonary/Chest: Effort normal. No accessory muscle usage or stridor. Not tachypneic. No respiratory distress. She has decreased breath sounds. She has no wheezes. She has rales in the right lower field.  Abdominal: Soft. Bowel sounds are normal. She exhibits no distension. There is no tenderness. There is no rebound and no guarding.  Musculoskeletal: She exhibits no edema and no tenderness.  Neurological: She is alert. She has normal strength. No cranial nerve deficit (no facial droop, extraocular movements intact, no slurred speech) or sensory deficit. She exhibits normal muscle tone. She displays no seizure activity. Coordination normal.  Skin: Skin is warm and dry. No rash noted. She is not diaphoretic.  Psychiatric: She has a normal mood and affect.    ED Course  Procedures (including critical care time) Labs Review Labs Reviewed  BASIC METABOLIC PANEL - Abnormal; Notable for the following:    Glucose, Bld 104 (*)    Creatinine, Ser 1.16 (*)    GFR calc non Af Amer 40 (*)    GFR calc Af Amer 47 (*)    All other components within normal limits  CBC WITH DIFFERENTIAL   Imaging Review Dg Chest 2 View  03/20/2014   CLINICAL DATA:  Heart disease and CHF.  Productive cough.  EXAM: CHEST  2 VIEW  COMPARISON:  DG CHEST 1V PORT dated 12/10/2012; CT ABD/PELV WO CM dated 06/13/2012; CT ANGIO CHEST W/CM &/OR WO/CM dated 11/05/2012  FINDINGS: Two views of the chest demonstrate upper lung lucency and emphysematous changes. There are bilateral nipple shadows in the lower chest on the frontal view. There is no focal airspace disease or consolidation. Heart size is within normal limits. Trachea is midline. Negative for pleural effusions. No acute bone abnormality. Atherosclerotic calcifications in the thoracic aorta. Few patchy densities at the left lung base could be related to overlying  breast tissue.  IMPRESSION: Chronic lung changes without acute findings.  Probable nipple shadows as described.   Electronically Signed   By: Richarda Overlie M.D.   On: 03/20/2014 16:05     MDM   Final diagnoses:  Cough    No evidence of pna.  Vitals are normal.  Pt denies rhinitis or itchy eyes. Likely viral rather than allergic.  Will dc home with cough medication.  At this time there does not appear to be any evidence of an acute emergency medical condition and the patient appears stable for discharge with appropriate outpatient follow up.     Celene Kras, MD 03/20/14 [phone]

## 2014-03-20 NOTE — ED Notes (Signed)
Pt [facility].

## 2014-04-06 ENCOUNTER — Ambulatory Visit
Admission: RE | Admit: 2014-04-06 | Discharge: 2014-04-06 | Disposition: A | Discharge status: Home / Self Care | Payer: Medicare Other | Source: Ambulatory Visit | Attending: Gastroenterology | Admitting: Gastroenterology

## 2014-04-06 ENCOUNTER — Other Ambulatory Visit: Payer: Self-pay | Admitting: Gastroenterology

## 2014-04-06 DIAGNOSIS — R52 Pain, unspecified: Secondary | ICD-10-CM

## 2014-04-06 DIAGNOSIS — K5900 Constipation, unspecified: Secondary | ICD-10-CM

## 2014-04-20 ENCOUNTER — Ambulatory Visit (INDEPENDENT_AMBULATORY_CARE_PROVIDER_SITE_OTHER): Payer: Medicare Other | Admitting: Cardiovascular Disease

## 2014-04-20 ENCOUNTER — Encounter: Payer: Self-pay | Admitting: Cardiovascular Disease

## 2014-04-20 VITALS — BP 157/60 | HR 63 | Ht 60.0 in | Wt 89.0 lb

## 2014-04-20 DIAGNOSIS — I5042 Chronic combined systolic (congestive) and diastolic (congestive) heart failure: Secondary | ICD-10-CM

## 2014-04-20 DIAGNOSIS — I10 Essential (primary) hypertension: Secondary | ICD-10-CM

## 2014-04-20 DIAGNOSIS — I214 Non-ST elevation (NSTEMI) myocardial infarction: Secondary | ICD-10-CM

## 2014-04-20 DIAGNOSIS — R0902 Hypoxemia: Secondary | ICD-10-CM

## 2014-04-20 DIAGNOSIS — J9611 Chronic respiratory failure with hypoxia: Secondary | ICD-10-CM

## 2014-04-20 DIAGNOSIS — J9610 Chronic respiratory failure, unspecified whether with hypoxia or hypercapnia: Secondary | ICD-10-CM

## 2014-04-20 MED ORDER — DEXTROMETHORPHAN-GUAIFENESIN 10-100 MG/5ML PO SYRP: 5.0000 mL | ORAL_SOLUTION | Freq: Two times a day (BID) | ORAL | Status: DC

## 2014-04-20 MED ORDER — CETIRIZINE HCL 10 MG PO TABS: 10.0000 mg | ORAL_TABLET | Freq: Every day | ORAL | Status: DC

## 2014-04-20 NOTE — Patient Instructions (Signed)
Your physician has recommended you make the following change in your medication:  START Zyrtec 1 tablet daily for cough and throat irritation START Robitussin over the counter cough syrup (follow package instructions) for cough and throat irritation  Your physician wants you to follow-up in: 1 year with Dr. [name] HashimotoNahser.  You will receive a reminder letter in the mail two months in advance. If you don't receive a letter, please call our office to schedule the follow-up appointment.

## 2014-04-20 NOTE — Progress Notes (Signed)
Kandace Blitz Date of Birth  June 27, 1922       [facility]    [place] [address], [address]  [address] [place], [place]  [address]   [place], [place]  [address] [phone]     [phone]   Fax  [phone]    Fax [phone]  Problem List: 1. CAD ( presumed) 2. PAD 3. Acute respiratory failure, acute necrotizing pneumonia 4.COPD 5. CHF - EF 40-45% 6.  CKD - stage 3 7. Dementia  History of Present Illness:  Ms. Suitt is an 80 yo who was recently in the hospital for pneumonia, mild CHF, COPD, and dementia.  She came with her daughter this am.  She is feeling much better.  Denies any chest pain or worsening of her dyspnea.   Feb. 5, 2014:  She is here today with her daughter , Lynden Ang. She was in the hospital in mid December for acute dyspnea - was found to have acute CHF.  She responded to IV lasix .   Her cardiac enzymes were negative this time.  She had a hospitalization in October which resulted in mild elevations of her Troponin levels.     She has continued to have frequent episodes of dyspnea.  These occur suddenly and last 10 minutes.   They are described as a hot, smothering senation - associated with profuse diaphoresis.    She is eating at her nursing home - the diet at times it is fairly salty.  She has had a progressive rise in her creatinine over the past several months ( associated with her Lasix therapy).  She also complains of having a dry mouth ever since starting Lasix therapy.  April 20, 2014:  Ms. Goranson is doing well.  She has noticed a significant improvement with the NTG patch.     Current Outpatient Prescriptions on File Prior to Visit  Medication Sig Dispense Refill  . albuterol (PROVENTIL) (2.5 MG/3ML) 0.083% nebulizer solution Take 2.5 mg by nebulization 4 (four) times daily.      Marland Kitchen ALPRAZolam (XANAX) 0.5 MG tablet Take 0.5 mg by mouth 2 (two) times daily.      Marland Kitchen aspirin 81 MG chewable tablet Chew 81 mg by  mouth every morning.       . clopidogrel (PLAVIX) 75 MG tablet Take 75 mg by mouth every morning.       Marland Kitchen dextromethorphan-guaiFENesin (MUCINEX DM) 30-600 MG per 12 hr tablet Take 1 tablet by mouth 2 (two) times daily as needed for cough.      . dipyridamole-aspirin (AGGRENOX) 200-25 MG per 12 hr capsule Take 1 capsule by mouth 2 (two) times daily.      Marland Kitchen donepezil (ARICEPT) 5 MG tablet Take 5 mg by mouth at bedtime.      . feeding supplement, GLUCERNA SHAKE, (GLUCERNA SHAKE) LIQD Take 237 mLs by mouth 2 (two) times daily between meals.      . furosemide (LASIX) 20 MG tablet Take 20 mg by mouth See admin instructions. Take 1 tablet by mouth on Monday, Wednesday, Friday, and Saturday.      Marland Kitchen HYDROcodone-acetaminophen (NORCO/VICODIN) 5-325 MG per tablet Take 1 tablet by mouth every 12 (twelve) hours as needed for moderate pain.      . magnesium oxide (MAG-OX) 400 MG tablet Take 200 mg by mouth every morning.      . Melatonin 1 MG TABS Take 1 mg by mouth at bedtime.      Marland Kitchen  metoprolol tartrate (LOPRESSOR) 25 MG tablet Take 25 mg by mouth 2 (two) times daily.      . mirtazapine (REMERON) 7.5 MG tablet Take 7.5 mg by mouth at bedtime.      . montelukast (SINGULAIR) 10 MG tablet Take 10 mg by mouth at bedtime.      . nitroGLYCERIN (NITRODUR - DOSED IN MG/24 HR) 0.2 mg/hr patch Place 0.2 mg onto the skin every morning.      . nitroGLYCERIN (NITROSTAT) 0.4 MG SL tablet Place 1 tablet (0.4 mg total) under the tongue every 5 (five) minutes as needed for chest pain (for break through chest pain in addition to nitro patch).  25 tablet  5  . pantoprazole (PROTONIX) 40 MG tablet Take 40 mg by mouth daily before breakfast.      . pravastatin (PRAVACHOL) 10 MG tablet Take 10 mg by mouth at bedtime.       . promethazine (PHENERGAN) 12.5 MG tablet Take 12.5 mg by mouth every 6 (six) hours as needed for nausea or vomiting.       . traMADol (ULTRAM) 50 MG tablet Take 50 mg by mouth every 8 (eight) hours as needed for  moderate pain.      . vitamin E 400 UNIT capsule Take 400 Units by mouth every morning.       No current facility-administered medications on file prior to visit.    Allergies  Allergen Reactions  . Altace [Ramipril] Other (See Comments)    Reaction unknown  . Ambien [Zolpidem Tartrate] Other (See Comments)    Reaction unknown  . Codeine Other (See Comments)    Reaction unknown  . Demerol [Meperidine] Other (See Comments)    Reaction unknown  . Penicillins Other (See Comments)    Reaction unknown  . Sulfa Antibiotics Other (See Comments)    Reaction unknown  . Versed [Midazolam] Other (See Comments)    Reaction unknown    Past Medical History  Diagnosis Date  . Hypertension   . Peripheral arterial disease   . Cognitive disorder   . Esophageal stricture     prior dilatation in June 2013  . Osteoarthritis   . Anxiety   . UTI (lower urinary tract infection) June 2013  . COPD (chronic obstructive pulmonary disease)     acute respiratory failure November 2013 with PNA  . NSTEMI (non-ST elevated myocardial infarction) November 2013    felt to be due to demand ischemia; managed conservatively; not a candidate for invasive testing due to general frailty  . IBS (irritable bowel syndrome)   . Acute combined systolic and diastolic HF (heart failure)     EF is 40 to 45% per echo November 2013  . Anemia     uncertain etiology  . Dementia   . Anxiety   . Sacral decubitus ulcer   . Necrotic pneumonia     Past Surgical History  Procedure Laterality Date  . Femoral bypass    . Esophagogastroduodenoscopy  06/21/2012    Procedure: ESOPHAGOGASTRODUODENOSCOPY (EGD);  Surgeon: Theda BelfastPatrick D Hung, MD;  [facility];  Service: Endoscopy;  Laterality: N/A;    History  Smoking status  . Former Smoker  . Quit date: 12/24/1996  Smokeless tobacco  . Never Used    Comment: per dtr, pt quit tobacco in 1998.    History  Alcohol Use No    Family History  Problem Relation Age  of Onset  . Coronary artery disease Father   . Coronary artery disease  Mother   . Hypertension Mother   . Breast cancer Maternal Grandmother   . Breast cancer Maternal Aunt     Reviw of Systems:  Reviewed in the HPI.  All other systems are negative.  Physical Exam: Blood pressure 157/60, pulse 63, height 5' (1.524 m), weight 89 lb (40.37 kg). General: Chronically ill-appearing, frail, elderly female  Head: Normocephalic, atraumatic, sclera non-icteric, mucus membranes in her mouth are dry.  Neck: Supple. Carotids are 2 + without bruits. No JVD   Lungs: Clear , slightly decreased breath sounds left base  Heart: RR  Abdomen: Soft, non-tender, non-distended with normal bowel sounds.  Msk:  Strength and tone are normal   Extremities: No clubbing or cyanosis. No edema.   Decreased skin turgur.   Distal pedal pulses are 2+ and equal   Neuro: CN II - XII intact.  Alert and oriented X 3.   Psych:  Normal   ECG: April 28,2015:  NSR at 3563 with PACs.  LVH with repol abn.   Assessment / Plan:

## 2014-04-20 NOTE — Assessment & Plan Note (Signed)
Ms. [name] is doing very well. She is on nitroglycerin patch which seems to be helping her quite a bit. He's no longer having any episodes of chest pain or shortness breath.  We'll continue with her same medications. She's having a cough and a tickle in her throat. And requested Robitussin which I think would work well for her. I'll have also recommended that she try Zyrtec one a day. Both of these can be bought over-the-counter.  I'll see her again in one year

## 2014-04-27 ENCOUNTER — Telehealth: Payer: Self-pay | Admitting: Cardiovascular Disease

## 2014-04-27 NOTE — Telephone Encounter (Signed)
Patient 's daughter called regarding concerns that the care facility that the patient resides in is not providing her medications in a timely fashion. She is requesting that Dr. [name] HashimotoNahser provide an order that states that the patient must be given her Xanax medication no later than 9:00 am. Advised that Dr. [name] HashimotoNahser is not the provider that ordered that medication, therefore she would need to speak with the PCP regarding that specific request. When further speaking to the patient's daughter, I noted that she was emotional and stated that she didn't know what to do to ensure her mother received appropriate care. Allowed patient's daughter to vent and express her concerns. Provided support and information on several ideas such as reaching out to Dr. Redmond Schoolripp, PCP, and to the Case Manager/Social Worker at [facility] and to the Nursing Director/Administrator at [facility] or she could also reach out to [facility] [employer] (Office of Patient Experience or the COO) to express her concerns. Patient's daughter verbalized understanding and appreciation for listening to the problem and trying to help problem solve her options. Advised her that should she not receive resolution to please let Dr. [name] HashimotoNahser know so that he is aware of continued concerns related to her care at the living facility.

## 2014-04-27 NOTE — Telephone Encounter (Signed)
Left pt's daughter a message to call back.

## 2014-04-27 NOTE — Telephone Encounter (Signed)
New message     [facility] is at [facility].  She is not getting her xanax by 9am daily.  Daughter said she is supposed to get it by 9am.  Can Dr [name] HashimotoNahser call them [phone] or fax a note [phone] requesting she get her xanax by 9am so that she does not become anxious?  Call daughter and let her know.

## 2014-07-16 ENCOUNTER — Telehealth: Payer: Self-pay | Admitting: Cardiovascular Disease

## 2014-07-16 ENCOUNTER — Ambulatory Visit
Admission: RE | Admit: 2014-07-16 | Discharge: 2014-07-16 | Disposition: A | Discharge status: Home / Self Care | Payer: Medicare Other | Source: Ambulatory Visit | Attending: Gastroenterology | Admitting: Gastroenterology

## 2014-07-16 ENCOUNTER — Other Ambulatory Visit: Payer: Self-pay | Admitting: Gastroenterology

## 2014-07-16 DIAGNOSIS — R140 Abdominal distension (gaseous): Secondary | ICD-10-CM

## 2014-07-16 DIAGNOSIS — R197 Diarrhea, unspecified: Secondary | ICD-10-CM

## 2014-07-16 NOTE — Telephone Encounter (Signed)
Christine from [facility] calling to see if pt can stop Plavix and Aggrenox today . Pt is scheduled for a Flex sigmoid on this coming Friday.  Called [facility] office and let them know that Dr [name] HashimotoNahser is not in the office today. This message will be send to MD for recommendations.

## 2014-07-16 NOTE — Telephone Encounter (Signed)
New message     Pt having a flex sigmoid on fri---need to know today if she can stop her plavix and her aggrenox.  Please call

## 2014-07-19 ENCOUNTER — Telehealth: Payer: Self-pay | Admitting: Cardiovascular Disease

## 2014-07-19 NOTE — Telephone Encounter (Signed)
Received request from Nurse fax box, documents faxed for surgical clearance. To: [facility] Fax number: [phone] Attention: [phone]/km

## 2014-07-19 NOTE — Telephone Encounter (Signed)
Informed Meredith at [facility]. Requested fax.  Will fax to [phone].

## 2014-07-19 NOTE — Telephone Encounter (Signed)
She may hold Plavix for 7 days. I did not prescribe aggrenox.

## 2014-08-09 ENCOUNTER — Encounter (HOSPITAL_COMMUNITY): Payer: Self-pay | Admitting: Emergency Medicine

## 2014-08-09 ENCOUNTER — Emergency Department (HOSPITAL_COMMUNITY): Payer: Medicare Other

## 2014-08-09 ENCOUNTER — Emergency Department (HOSPITAL_COMMUNITY)
Admission: EM | Admit: 2014-08-09 | Discharge: 2014-08-09 | Disposition: A | Discharge status: Home / Self Care | Payer: Medicare Other | Attending: Emergency Medicine | Admitting: Emergency Medicine

## 2014-08-09 DIAGNOSIS — R3589 Other polyuria: Secondary | ICD-10-CM | POA: Diagnosis not present

## 2014-08-09 DIAGNOSIS — Z7982 Long term (current) use of aspirin: Secondary | ICD-10-CM | POA: Insufficient documentation

## 2014-08-09 DIAGNOSIS — J449 Chronic obstructive pulmonary disease, unspecified: Secondary | ICD-10-CM | POA: Diagnosis not present

## 2014-08-09 DIAGNOSIS — Z862 Personal history of diseases of the blood and blood-forming organs and certain disorders involving the immune mechanism: Secondary | ICD-10-CM | POA: Insufficient documentation

## 2014-08-09 DIAGNOSIS — F0390 Unspecified dementia without behavioral disturbance: Secondary | ICD-10-CM | POA: Insufficient documentation

## 2014-08-09 DIAGNOSIS — K589 Irritable bowel syndrome without diarrhea: Secondary | ICD-10-CM | POA: Insufficient documentation

## 2014-08-09 DIAGNOSIS — Z8701 Personal history of pneumonia (recurrent): Secondary | ICD-10-CM | POA: Insufficient documentation

## 2014-08-09 DIAGNOSIS — K5900 Constipation, unspecified: Secondary | ICD-10-CM | POA: Diagnosis not present

## 2014-08-09 DIAGNOSIS — Z7902 Long term (current) use of antithrombotics/antiplatelets: Secondary | ICD-10-CM | POA: Insufficient documentation

## 2014-08-09 DIAGNOSIS — M1990 Unspecified osteoarthritis, unspecified site: Secondary | ICD-10-CM | POA: Insufficient documentation

## 2014-08-09 DIAGNOSIS — M545 Low back pain, unspecified: Secondary | ICD-10-CM | POA: Insufficient documentation

## 2014-08-09 DIAGNOSIS — N189 Chronic kidney disease, unspecified: Secondary | ICD-10-CM | POA: Diagnosis not present

## 2014-08-09 DIAGNOSIS — IMO0002 Reserved for concepts with insufficient information to code with codable children: Secondary | ICD-10-CM | POA: Diagnosis not present

## 2014-08-09 DIAGNOSIS — F411 Generalized anxiety disorder: Secondary | ICD-10-CM | POA: Insufficient documentation

## 2014-08-09 DIAGNOSIS — Z87891 Personal history of nicotine dependence: Secondary | ICD-10-CM | POA: Insufficient documentation

## 2014-08-09 DIAGNOSIS — Z872 Personal history of diseases of the skin and subcutaneous tissue: Secondary | ICD-10-CM | POA: Diagnosis not present

## 2014-08-09 DIAGNOSIS — Z79899 Other long term (current) drug therapy: Secondary | ICD-10-CM | POA: Diagnosis not present

## 2014-08-09 DIAGNOSIS — I252 Old myocardial infarction: Secondary | ICD-10-CM | POA: Insufficient documentation

## 2014-08-09 DIAGNOSIS — I129 Hypertensive chronic kidney disease with stage 1 through stage 4 chronic kidney disease, or unspecified chronic kidney disease: Secondary | ICD-10-CM | POA: Diagnosis not present

## 2014-08-09 DIAGNOSIS — J4489 Other specified chronic obstructive pulmonary disease: Secondary | ICD-10-CM | POA: Insufficient documentation

## 2014-08-09 DIAGNOSIS — Z87440 Personal history of urinary (tract) infections: Secondary | ICD-10-CM | POA: Diagnosis not present

## 2014-08-09 DIAGNOSIS — R358 Other polyuria: Secondary | ICD-10-CM | POA: Diagnosis not present

## 2014-08-09 DIAGNOSIS — I5041 Acute combined systolic (congestive) and diastolic (congestive) heart failure: Secondary | ICD-10-CM | POA: Diagnosis not present

## 2014-08-09 DIAGNOSIS — Z880 Allergy status to penicillin: Secondary | ICD-10-CM | POA: Insufficient documentation

## 2014-08-09 DIAGNOSIS — M791 Myalgia, unspecified site: Secondary | ICD-10-CM

## 2014-08-09 DIAGNOSIS — I2510 Atherosclerotic heart disease of native coronary artery without angina pectoris: Secondary | ICD-10-CM | POA: Insufficient documentation

## 2014-08-09 LAB — CBC WITH DIFFERENTIAL/PLATELET
Basophils Absolute: 0 K/uL (ref 0.0–0.1)
Basophils Relative: 0 % (ref 0–1)
Eosinophils Absolute: 0.1 K/uL (ref 0.0–0.7)
Eosinophils Relative: 1 % (ref 0–5)
HCT: 41 % (ref 36.0–46.0)
Hemoglobin: 13.3 g/dL (ref 12.0–15.0)
Lymphocytes Relative: 16 % (ref 12–46)
Lymphs Abs: 1.1 K/uL (ref 0.7–4.0)
MCH: 29.7 pg (ref 26.0–34.0)
MCHC: 32.4 g/dL (ref 30.0–36.0)
MCV: 91.5 fL (ref 78.0–100.0)
Monocytes Absolute: 0.5 K/uL (ref 0.1–1.0)
Monocytes Relative: 8 % (ref 3–12)
Neutro Abs: 5.1 K/uL (ref 1.7–7.7)
Neutrophils Relative %: 75 % (ref 43–77)
Platelets: 213 K/uL (ref 150–400)
RBC: 4.48 MIL/uL (ref 3.87–5.11)
RDW: 12.6 % (ref 11.5–15.5)
WBC: 6.7 K/uL (ref 4.0–10.5)

## 2014-08-09 LAB — URINALYSIS, ROUTINE W REFLEX MICROSCOPIC
Bilirubin Urine: NEGATIVE
Glucose, UA: NEGATIVE mg/dL
Hgb urine dipstick: NEGATIVE
Ketones, ur: NEGATIVE mg/dL
Leukocytes, UA: NEGATIVE
Nitrite: NEGATIVE
Protein, ur: NEGATIVE mg/dL
Specific Gravity, Urine: 1.007 (ref 1.005–1.030)
Urobilinogen, UA: 0.2 mg/dL (ref 0.0–1.0)
pH: 6 (ref 5.0–8.0)

## 2014-08-09 LAB — BASIC METABOLIC PANEL WITH GFR
Anion gap: 16 — ABNORMAL HIGH (ref 5–15)
BUN: 19 mg/dL (ref 6–23)
CO2: 29 meq/L (ref 19–32)
Calcium: 10.1 mg/dL (ref 8.4–10.5)
Chloride: 98 meq/L (ref 96–112)
Creatinine, Ser: 1.13 mg/dL — ABNORMAL HIGH (ref 0.50–1.10)
GFR calc Af Amer: 48 mL/min — ABNORMAL LOW
GFR calc non Af Amer: 41 mL/min — ABNORMAL LOW
Glucose, Bld: 92 mg/dL (ref 70–99)
Potassium: 4 meq/L (ref 3.7–5.3)
Sodium: 143 meq/L (ref 137–147)

## 2014-08-09 LAB — I-STAT TROPONIN, ED: Troponin i, poc: 0.01 ng/mL (ref 0.00–0.08)

## 2014-08-09 MED ORDER — MELOXICAM 7.5 MG PO TABS: 7.5000 mg | ORAL_TABLET | Freq: Every day | ORAL | Status: DC

## 2014-08-09 MED ORDER — HYDROCODONE-ACETAMINOPHEN 5-325 MG PO TABS: 1.0000 | ORAL_TABLET | Freq: Four times a day (QID) | ORAL | Status: DC | PRN

## 2014-08-09 MED ORDER — HYDROCODONE-ACETAMINOPHEN 5-325 MG PO TABS
1.0000 | ORAL_TABLET | Freq: Once | ORAL | Status: AC
  Administered 2014-08-09: 1 via ORAL
  Filled 2014-08-09: qty 1

## 2014-08-09 MED ORDER — METHOCARBAMOL 500 MG PO TABS: 1000.0000 mg | ORAL_TABLET | Freq: Three times a day (TID) | ORAL | Status: DC | PRN

## 2014-08-09 MED ORDER — MELOXICAM 7.5 MG PO TABS
7.5000 mg | ORAL_TABLET | Freq: Every day | ORAL | Status: DC
  Administered 2014-08-09: 7.5 mg via ORAL
  Filled 2014-08-09: qty 1

## 2014-08-09 MED ORDER — METHOCARBAMOL 500 MG PO TABS
1000.0000 mg | ORAL_TABLET | Freq: Once | ORAL | Status: AC
  Administered 2014-08-09: 1000 mg via ORAL
  Filled 2014-08-09: qty 2

## 2014-08-09 NOTE — ED Provider Notes (Signed)
Pt seen and evaluated.  Discussed with Dr. Valentino Saxonothman.  Pt with Lt back pain from scapula to lumbar paraspinal musculature.  Sx for 7 days, atraumatic.  No additional symptoms. Denies SOB, dysuria, cough, fever, rash. Exam shows pulse 57, sat of 94 on RA (pt also normally on 2lNC). Not dyspneic.  No vesicles. Clear lungs.  No abnormalities on thoracic spine films, chest x-ray. Normal urine. No white blood cell count elevation. I think her symptoms are musculoskeletal. I agree with assessment as above. Plan will be anti-inflammatories for a limited basis for 2-3 days only, pain medication, muscle relaxants. Primary care followup if not improving.  Rolland PorterMark Elide Stalzer, MD 08/09/14 [phone]

## 2014-08-09 NOTE — ED Notes (Addendum)
Pt from [facility], pt c/o left back pain x 5 days. Pt states it is getting worse and could hardly stand up last night or this morning. Hurts with deep breathe as well.

## 2014-08-09 NOTE — Discharge Instructions (Signed)
You were seen in the ED today for your back pain. Please schedule a follow-up appointment with your PCP within the next week for potential MRI referral. You were given pain medications and muscle relaxants for your back. Please seek medical attention or return to the ED if you have new or worsening back pain, weakness, numbness, or other worrisome medical conditions.

## 2014-08-09 NOTE — ED Provider Notes (Signed)
CSN: [id number]     Arrival date & time 08/09/14  1203 History   First MD Initiated Contact with Patient 08/09/14 1416     Chief Complaint  Patient presents with  . Back Pain     (Consider location/radiation/quality/duration/timing/severity/associated sxs/prior Treatment) Patient is a 80 y.o. female presenting with back pain.  Back Pain Associated symptoms: no abdominal pain, no chest pain, no dysuria, no fever and no numbness     [name] is a 81 year old woman w CAD s/p NSTEMI 2013, CHF, CKD, HTN who presents with 5 days of L back pain. She said she woke up 5 days ago at her nursing home facility with this throbbing and sometimes sharp pain. It is left sided from above her pelvic bone up to her mid thoracic spine. It is worse with deep inspiration and her ultram does not help relieve the pain. She uses a walker to ambulate and does not remember any trauma or physical exertion that may have caused or contributed. She denies dysuria and thinks her polyuria is unchanged.   Past Medical History  Diagnosis Date  . Hypertension   . Peripheral arterial disease   . Cognitive disorder   . Esophageal stricture     prior dilatation in June 2013  . Osteoarthritis   . Anxiety   . UTI (lower urinary tract infection) June 2013  . COPD (chronic obstructive pulmonary disease)     acute respiratory failure November 2013 with PNA  . NSTEMI (non-ST elevated myocardial infarction) November 2013    felt to be due to demand ischemia; managed conservatively; not a candidate for invasive testing due to general frailty  . IBS (irritable bowel syndrome)   . Acute combined systolic and diastolic HF (heart failure)     EF is 40 to 45% per echo November 2013  . Anemia     uncertain etiology  . Dementia   . Anxiety   . Sacral decubitus ulcer   . Necrotic pneumonia    Past Surgical History  Procedure Laterality Date  . Femoral bypass    . Esophagogastroduodenoscopy  06/21/2012    Procedure:  ESOPHAGOGASTRODUODENOSCOPY (EGD);  Surgeon: Theda Belfast, MD;  Location: [facility];  Service: Endoscopy;  Laterality: N/A;   Family History  Problem Relation Age of Onset  . Coronary artery disease Father   . Coronary artery disease Mother   . Hypertension Mother   . Breast cancer Maternal Grandmother   . Breast cancer Maternal Aunt    History  Substance Use Topics  . Smoking status: Former Smoker    Quit date: 12/24/1996  . Smokeless tobacco: Never Used     Comment: per dtr, pt quit tobacco in 1998.  Marland Kitchen Alcohol Use: No   OB History   Grav Para Term Preterm Abortions TAB SAB Ect Mult Living                 Review of Systems  Constitutional: Negative for fever, chills and diaphoresis.  Respiratory: Negative for cough, chest tightness and shortness of breath.   Cardiovascular: Negative for chest pain and palpitations.  Gastrointestinal: Positive for constipation. Negative for nausea, vomiting, abdominal pain and diarrhea.  Endocrine: Positive for polyuria.  Genitourinary: Negative for dysuria.  Musculoskeletal: Positive for back pain. Negative for neck pain.  Neurological: Negative for numbness.      Allergies  Altace; Ambien; Codeine; Demerol; Penicillins; Sulfa antibiotics; and Versed  Home Medications   Prior to Admission medications   Medication  Sig Start Date End Date Taking? Authorizing Provider  albuterol (PROVENTIL) (2.5 MG/3ML) 0.083% nebulizer solution Take 2.5 mg by nebulization 4 (four) times daily.   Yes Historical Provider, MD  aspirin 81 MG chewable tablet Chew 81 mg by mouth every morning.    Yes Historical Provider, MD  budesonide (ENTOCORT EC) 3 MG 24 hr capsule Take 9 mg by mouth daily.   Yes Historical Provider, MD  Carboxymethylcellul-Glycerin 0.5-0.9 % SOLN Apply 1 drop to eye 4 (four) times daily.   Yes Historical Provider, MD  clopidogrel (PLAVIX) 75 MG tablet Take 75 mg by mouth every morning.    Yes Historical Provider, MD  donepezil  (ARICEPT) 5 MG tablet Take 5 mg by mouth at bedtime.   Yes Historical Provider, MD  furosemide (LASIX) 20 MG tablet Take 20 mg by mouth See admin instructions. Take 1 tablet by mouth on Monday, Wednesday, Friday, and Saturday.   Yes Historical Provider, MD  Melatonin 1 MG TABS Take 1 mg by mouth at bedtime.   Yes Historical Provider, MD  mirtazapine (REMERON) 7.5 MG tablet Take 7.5 mg by mouth at bedtime.   Yes Historical Provider, MD  nitroGLYCERIN (NITROSTAT) 0.4 MG SL tablet Place 1 tablet (0.4 mg total) under the tongue every 5 (five) minutes as needed for chest pain (for break through chest pain in addition to nitro patch). 01/28/13  Yes Vesta Mixer, MD  pantoprazole (PROTONIX) 40 MG tablet Take 40 mg by mouth daily before breakfast.   Yes Historical Provider, MD  pravastatin (PRAVACHOL) 10 MG tablet Take 10 mg by mouth at bedtime.    Yes Historical Provider, MD  promethazine (PHENERGAN) 12.5 MG tablet Take 12.5 mg by mouth every 6 (six) hours as needed for nausea or vomiting.    Yes Historical Provider, MD  psyllium (METAMUCIL) 58.6 % packet Take 1 packet by mouth daily.   Yes Historical Provider, MD  sodium chloride (OCEAN) 0.65 % SOLN nasal spray Place 2 sprays into both nostrils as needed for congestion.   Yes Historical Provider, MD  traMADol (ULTRAM) 50 MG tablet Take 50 mg by mouth every 8 (eight) hours as needed for moderate pain.   Yes Historical Provider, MD  HYDROcodone-acetaminophen (NORCO/VICODIN) 5-325 MG per tablet Take 1 tablet by mouth every 6 (six) hours as needed for moderate pain. 08/09/14   Lorenda Hatchet, MD  meloxicam (MOBIC) 7.5 MG tablet Take 1 tablet (7.5 mg total) by mouth daily. 08/09/14   Lorenda Hatchet, MD  methocarbamol (ROBAXIN) 500 MG tablet Take 2 tablets (1,000 mg total) by mouth 3 (three) times daily as needed for muscle spasms. 08/09/14   Lorenda Hatchet, MD   BP 143/64  Pulse 57  Temp(Src) 98.4 F (36.9 C) (Oral)  Resp 16  SpO2 96% Physical Exam  Vitals  reviewed. Constitutional: She is oriented to person, place, and time. She appears well-developed. No distress.  thin  HENT:  Head: Normocephalic and atraumatic.  Mouth/Throat: Oropharynx is clear and moist.  Eyes: EOM are normal. Pupils are equal, round, and reactive to light.  Cardiovascular: Normal rate, regular rhythm, normal heart sounds and intact distal pulses.  Exam reveals no gallop and no friction rub.   No murmur heard. Pulmonary/Chest: Effort normal and breath sounds normal. No respiratory distress.  Abdominal: Soft. Bowel sounds are normal. She exhibits no distension. There is no tenderness.  No CVA tenderness  Musculoskeletal: She exhibits no edema and no tenderness.  Neurological: She is alert and oriented  to person, place, and time.  Skin: She is not diaphoretic.    ED Course  Procedures (including critical care time) Labs Review Labs Reviewed  BASIC METABOLIC PANEL - Abnormal; Notable for the following:    Creatinine, Ser 1.13 (*)    GFR calc non Af Amer 41 (*)    GFR calc Af Amer 48 (*)    Anion gap 16 (*)    All other components within normal limits  CBC WITH DIFFERENTIAL  URINALYSIS, ROUTINE W REFLEX MICROSCOPIC  I-STAT TROPOININ, ED    Imaging Review Dg Chest 2 View  08/09/2014   CLINICAL DATA:  Left posterior back pain.  EXAM: CHEST  2 VIEW  COMPARISON:  03/20/2014.  FINDINGS: Trachea is midline. Heart is enlarged. Mild biapical pleural parenchymal scarring. Lungs are hyperinflated. There may be a calcified granuloma in the right upper lobe. Nipple shadows project over the lower hemithoraces. No pleural fluid. Osteopenia.  IMPRESSION: No acute findings.   Electronically Signed   By: Leanna Battles M.D.   On: 08/09/2014 15:49   Dg Thoracic Spine 2 View  08/09/2014   CLINICAL DATA:  Back pain  EXAM: THORACIC SPINE - 3 VIEW  COMPARISON:  Chest radiograph March 20, 2014  FINDINGS: Frontal, lateral, and swimmer's views were obtained. There is thoracolumbar  levoscoliosis. There is no fracture or spondylolisthesis. There is mild disc space narrowing at several levels.  IMPRESSION: Mild osteoarthritic changes several levels. Scoliosis. No fracture or spondylolisthesis.   Electronically Signed   By: Bretta Bang M.D.   On: 08/09/2014 15:56     EKG Interpretation   Date/Time:  Monday August 09 2014 15:07:44 EDT Ventricular Rate:  67 PR Interval:  166 QRS Duration: 77 QT Interval:  455 QTC Calculation: 480 R Axis:   23 Text Interpretation:  Sinus rhythm Supraventricular bigeminy Consider left  ventricular hypertrophy Baseline wander in lead(s) V4 Confirmed by Fayrene Fearing   MD, MARK ([id number]) on 08/09/2014 3:50:52 PM      MDM   Final diagnoses:  Muscular pain    2:56PM: Etiology unclear. Need to rule out atypical presentation of ACS although no substernal chest pain, diaphoresis, neck or L arm pain so will get EKG and troponin. No CVA tenderness or L back tenderness but will get UA.  PE unlikely as she is sating well with no SOB and HR in the 50s. Will also get CBC, BMP. Likely musculoskeletal such as strain or sprain and will get CXR and t spine film.  4:21PM: EKG unchanged w negative troponin. UA, CBC, BMP negative. Films unrevealing this is likely musculoskeletal. We will discharge with mobic, robaxin, and norco for pain and ask patient to schedule f/u within the next week with PCP to evaluate for potential MRI.   Lorenda Hatchet, MD 08/09/14 [phone]

## 2014-08-10 NOTE — ED Provider Notes (Signed)
I saw and evaluated the patient, reviewed the resident's note and I agree with the findings and plan.   EKG Interpretation   Date/Time:  Monday August 09 2014 15:07:44 EDT Ventricular Rate:  67 PR Interval:  166 QRS Duration: 77 QT Interval:  455 QTC Calculation: 480 R Axis:   23 Text Interpretation:  Sinus rhythm Supraventricular bigeminy Consider left  ventricular hypertrophy Baseline wander in lead(s) V4 Confirmed by Fayrene FearingJAMES   MD, Kaidance Pantoja ([id number]) on 08/09/2014 3:50:52 PM     Please see my additional dictation.   Rolland PorterMark Azul Coffie, MD 08/10/14 [phone]

## 2014-08-19 ENCOUNTER — Other Ambulatory Visit: Payer: Self-pay | Admitting: Geriatric Medicine

## 2014-08-19 DIAGNOSIS — M549 Dorsalgia, unspecified: Secondary | ICD-10-CM

## 2014-08-20 ENCOUNTER — Other Ambulatory Visit: Payer: Medicare Other

## 2014-08-25 ENCOUNTER — Other Ambulatory Visit: Payer: Medicare Other

## 2014-10-27 ENCOUNTER — Encounter: Payer: Self-pay | Admitting: Pulmonary Disease

## 2014-10-27 ENCOUNTER — Ambulatory Visit (INDEPENDENT_AMBULATORY_CARE_PROVIDER_SITE_OTHER): Payer: Medicare Other | Admitting: Pulmonary Disease

## 2014-10-27 VITALS — BP 110/68 | HR 69 | Ht 60.0 in | Wt 87.0 lb

## 2014-10-27 DIAGNOSIS — J9611 Chronic respiratory failure with hypoxia: Secondary | ICD-10-CM

## 2014-10-27 DIAGNOSIS — Z23 Encounter for immunization: Secondary | ICD-10-CM

## 2014-10-27 DIAGNOSIS — J432 Centrilobular emphysema: Secondary | ICD-10-CM

## 2014-10-27 DIAGNOSIS — I214 Non-ST elevation (NSTEMI) myocardial infarction: Secondary | ICD-10-CM

## 2014-10-27 NOTE — Assessment & Plan Note (Signed)
Continue 2 liters oxygen 24/7. 

## 2014-10-27 NOTE — Patient Instructions (Signed)
Flu and Prevnar shots today Follow up in 6 months

## 2014-10-27 NOTE — Assessment & Plan Note (Signed)
Continue albuterol nebulizer therapy as needed.  She will get flu and prevnar shots today.

## 2014-10-27 NOTE — Addendum Note (Signed)
Addended by: Maisie FusGREEN, ASHTYN M on: 10/27/2014 05:18 PM   Modules accepted: Orders

## 2014-10-27 NOTE — Progress Notes (Signed)
Chief Complaint  Patient presents with  . Follow-up    Pt reports some SOB with heavier exertional activities. Denies cough, wheezing and chest tightness. Would like to discuss getting PNA vaccine, prone to geting PNA    History of Present Illness: Allison BlitzHelen Errico is a 80 y.o. female with COPD/emphysema and chronic hypoxic respiratory failure.  She has been doing okay.  She gets winded with brisk activity.  She is okay at slow pace.  She is not having cough, wheeze, or sputum.  She is using 2 liters oxygen 24/7.     TESTS: 10/16/12 Echo >> mod LVH, EF 40 to 45%, grade 1 diastolic dysfx  11/05/12 CT chest >> no PE, small b/l effusions, emphysema, basilar ATX  12/02/12 Spirometry >> FEV1 0.65 (47%), FEV1% 40  PMHx, PSHx, Medications, Allergies, Fhx, Shx reviewed.  Physical Exam:  General - No distress, thin, wearing oxygen ENT - No sinus tenderness, no oral exudate, no LAN, difficulty with hearing Cardiac - s1s2 regular, no murmur Chest - decreased breath sounds, no wheeze Back - No focal tenderness Abd - Soft, non-tender Ext - No edema Neuro - Normal strength Skin - No rashes Psych - normal mood, and behavior   Assessment/Plan:  Coralyn HellingVineet Mekhi Sonn, MD [facility] Pager:  [phone]

## 2014-11-06 ENCOUNTER — Emergency Department (INDEPENDENT_AMBULATORY_CARE_PROVIDER_SITE_OTHER)
Admission: EM | Admit: 2014-11-06 | Discharge: 2014-11-06 | Disposition: A | Discharge status: Home / Self Care | Payer: Medicare Other | Source: Home / Self Care | Attending: Family Medicine | Admitting: Family Medicine

## 2014-11-06 ENCOUNTER — Encounter (HOSPITAL_COMMUNITY): Payer: Self-pay | Admitting: Emergency Medicine

## 2014-11-06 DIAGNOSIS — N390 Urinary tract infection, site not specified: Secondary | ICD-10-CM

## 2014-11-06 LAB — POCT URINALYSIS DIP (DEVICE)
Bilirubin Urine: NEGATIVE
GLUCOSE, UA: NEGATIVE mg/dL
KETONES UR: NEGATIVE mg/dL
Nitrite: NEGATIVE
PROTEIN: 100 mg/dL — AB
SPECIFIC GRAVITY, URINE: 1.01 (ref 1.005–1.030)
Urobilinogen, UA: 0.2 mg/dL (ref 0.0–1.0)
pH: 5 (ref 5.0–8.0)

## 2014-11-06 MED ORDER — CIPROFLOXACIN HCL 250 MG PO TABS: 250.0000 mg | ORAL_TABLET | Freq: Two times a day (BID) | ORAL | Status: DC

## 2014-11-06 NOTE — ED Provider Notes (Signed)
Allison BlitzHelen Vaughn is a 80 y.o. female who presents to Urgent Care today for Urinary frequency urgency and burning with urination associated with left flank pain present for 2 days. Symptoms are consistent with previous episodes of urinary tract infection. No medications tried yet. No fevers or chills nausea vomiting or diarrhea.   Past Medical History  Diagnosis Date  . Hypertension   . Peripheral arterial disease   . Cognitive disorder   . Esophageal stricture     prior dilatation in June 2013  . Osteoarthritis   . Anxiety   . UTI (lower urinary tract infection) June 2013  . COPD (chronic obstructive pulmonary disease)     acute respiratory failure November 2013 with PNA  . NSTEMI (non-ST elevated myocardial infarction) November 2013    felt to be due to demand ischemia; managed conservatively; not a candidate for invasive testing due to general frailty  . IBS (irritable bowel syndrome)   . Acute combined systolic and diastolic HF (heart failure)     EF is 40 to 45% per echo November 2013  . Anemia     uncertain etiology  . Dementia   . Anxiety   . Sacral decubitus ulcer   . Necrotic pneumonia    Past Surgical History  Procedure Laterality Date  . Femoral bypass    . Esophagogastroduodenoscopy  06/21/2012    Procedure: ESOPHAGOGASTRODUODENOSCOPY (EGD);  Surgeon: Theda BelfastPatrick D Hung, MD;  Location: [facility];  Service: Endoscopy;  Laterality: N/A;   History  Substance Use Topics  . Smoking status: Former Smoker    Quit date: 12/24/1996  . Smokeless tobacco: Never Used     Comment: per dtr, pt quit tobacco in 1998.  Marland Kitchen. Alcohol Use: No   ROS as above Medications: No current facility-administered medications for this encounter.   Current Outpatient Prescriptions  Medication Sig Dispense Refill  . albuterol (PROVENTIL) (2.5 MG/3ML) 0.083% nebulizer solution Take 2.5 mg by nebulization 4 (four) times daily.    Marland Kitchen. aspirin 81 MG chewable tablet Chew 81 mg by mouth every morning.     .  budesonide (ENTOCORT EC) 3 MG 24 hr capsule Take 9 mg by mouth daily.    . Carboxymethylcellul-Glycerin 0.5-0.9 % SOLN Apply 1 drop to eye 4 (four) times daily.    . ciprofloxacin (CIPRO) 250 MG tablet Take 1 tablet (250 mg total) by mouth 2 (two) times daily. 14 tablet 0  . clopidogrel (PLAVIX) 75 MG tablet Take 75 mg by mouth every morning.     . donepezil (ARICEPT) 5 MG tablet Take 5 mg by mouth at bedtime.    . furosemide (LASIX) 20 MG tablet Take 20 mg by mouth See admin instructions. Take 1 tablet by mouth on Monday, Wednesday, Friday, and Saturday.    Marland Kitchen. HYDROcodone-acetaminophen (NORCO/VICODIN) 5-325 MG per tablet Take 1 tablet by mouth every 6 (six) hours as needed for moderate pain. 12 tablet 0  . Melatonin 1 MG TABS Take 1 mg by mouth at bedtime.    . meloxicam (MOBIC) 7.5 MG tablet Take 1 tablet (7.5 mg total) by mouth daily. 2 tablet 0  . methocarbamol (ROBAXIN) 500 MG tablet Take 2 tablets (1,000 mg total) by mouth 3 (three) times daily as needed for muscle spasms. 42 tablet 0  . mirtazapine (REMERON) 7.5 MG tablet Take 7.5 mg by mouth at bedtime.    . nitroGLYCERIN (NITROSTAT) 0.4 MG SL tablet Place 1 tablet (0.4 mg total) under the tongue every 5 (five) minutes as needed  for chest pain (for break through chest pain in addition to nitro patch). 25 tablet 5  . pantoprazole (PROTONIX) 40 MG tablet Take 40 mg by mouth daily before breakfast.    . pravastatin (PRAVACHOL) 10 MG tablet Take 10 mg by mouth at bedtime.     . promethazine (PHENERGAN) 12.5 MG tablet Take 12.5 mg by mouth every 6 (six) hours as needed for nausea or vomiting.     . psyllium (METAMUCIL) 58.6 % packet Take 1 packet by mouth daily.    . sodium chloride (OCEAN) 0.65 % SOLN nasal spray Place 2 sprays into both nostrils as needed for congestion.    . traMADol (ULTRAM) 50 MG tablet Take 50 mg by mouth every 8 (eight) hours as needed for moderate pain.     Allergies  Allergen Reactions  . Altace [Ramipril] Other (See  Comments)    Reaction unknown  . Ambien [Zolpidem Tartrate] Other (See Comments)    Reaction unknown  . Codeine Other (See Comments)    Reaction unknown  . Demerol [Meperidine] Other (See Comments)    Reaction unknown  . Penicillins Other (See Comments)    Reaction unknown  . Sulfa Antibiotics Other (See Comments)    Reaction unknown  . Versed [Midazolam] Other (See Comments)    Reaction unknown     Exam:  BP 129/78 mmHg  Pulse 71  Temp(Src) 97.6 F (36.4 C) (Oral)  Resp 16  SpO2 97% Gen: Well NAD HEENT: EOMI,  MMM Lungs: Normal work of breathing. CTABL Heart: RRR no MRG Abd: NABS, Soft. Nondistended, Nontender no CV angle tenderness to percussion. Exts: Brisk capillary refill, warm and well perfused.   Results for orders placed or performed during the hospital encounter of 11/06/14 (from the past 24 hour(s))  POCT urinalysis dip (device)     Status: Abnormal   Collection Time: 11/06/14  3:18 PM  Result Value Ref Range   Glucose, UA NEGATIVE NEGATIVE mg/dL   Bilirubin Urine NEGATIVE NEGATIVE   Ketones, ur NEGATIVE NEGATIVE mg/dL   Specific Gravity, Urine 1.010 1.005 - 1.030   Hgb urine dipstick LARGE (A) NEGATIVE   pH 5.0 5.0 - 8.0   Protein, ur 100 (A) NEGATIVE mg/dL   Urobilinogen, UA 0.2 0.0 - 1.0 mg/dL   Nitrite NEGATIVE NEGATIVE   Leukocytes, UA LARGE (A) NEGATIVE   No results found.  Assessment and Plan: 80 y.o. female with urinary tract infection. Culture pending. Treatment with Cipro.  Discussed warning signs or symptoms. Please see discharge instructions. Patient expresses understanding.     Rodolph BongEvan S Corey, MD 11/06/14 [phone]

## 2014-11-06 NOTE — Discharge Instructions (Signed)
Thank you for coming in today. If your belly pain worsens, or you have high fever, bad vomiting, blood in your stool or black tarry stool go to the Emergency Room.   Urinary Tract Infection Urinary tract infections (UTIs) can develop anywhere along your urinary tract. Your urinary tract is your body's drainage system for removing wastes and extra water. Your urinary tract includes two kidneys, two ureters, a bladder, and a urethra. Your kidneys are a pair of bean-shaped organs. Each kidney is about the size of your fist. They are located below your ribs, one on each side of your spine. CAUSES Infections are caused by microbes, which are microscopic organisms, including fungi, viruses, and bacteria. These organisms are so small that they can only be seen through a microscope. Bacteria are the microbes that most commonly cause UTIs. SYMPTOMS  Symptoms of UTIs may vary by age and gender of the patient and by the location of the infection. Symptoms in young women typically include a frequent and intense urge to urinate and a painful, burning feeling in the bladder or urethra during urination. Older women and men are more likely to be tired, shaky, and weak and have muscle aches and abdominal pain. A fever may mean the infection is in your kidneys. Other symptoms of a kidney infection include pain in your back or sides below the ribs, nausea, and vomiting. DIAGNOSIS To diagnose a UTI, your caregiver will ask you about your symptoms. Your caregiver also will ask to provide a urine sample. The urine sample will be tested for bacteria and white blood cells. White blood cells are made by your body to help fight infection. TREATMENT  Typically, UTIs can be treated with medication. Because most UTIs are caused by a bacterial infection, they usually can be treated with the use of antibiotics. The choice of antibiotic and length of treatment depend on your symptoms and the type of bacteria causing your  infection. HOME CARE INSTRUCTIONS  If you were prescribed antibiotics, take them exactly as your caregiver instructs you. Finish the medication even if you feel better after you have only taken some of the medication.  Drink enough water and fluids to keep your urine clear or pale yellow.  Avoid caffeine, tea, and carbonated beverages. They tend to irritate your bladder.  Empty your bladder often. Avoid holding urine for long periods of time.  Empty your bladder before and after sexual intercourse.  After a bowel movement, women should cleanse from front to back. Use each tissue only once. SEEK MEDICAL CARE IF:   You have back pain.  You develop a fever.  Your symptoms do not begin to resolve within 3 days. SEEK IMMEDIATE MEDICAL CARE IF:   You have severe back pain or lower abdominal pain.  You develop chills.  You have nausea or vomiting.  You have continued burning or discomfort with urination. MAKE SURE YOU:   Understand these instructions.  Will watch your condition.  Will get help right away if you are not doing well or get worse. Document Released: 09/19/2005 Document Revised: 06/10/2012 Document Reviewed: 01/18/2012 Exit[employer] Patient Information 2015 [employer]. This information is not intended to replace advice given to you by your health care provider. Make sure you discuss any questions you have with your health care provider.  

## 2014-11-06 NOTE — ED Notes (Signed)
Patient still unable to give urine sample.

## 2014-11-06 NOTE — ED Notes (Signed)
Patient unable to give sample;  Patient given cup of water

## 2014-11-06 NOTE — ED Notes (Signed)
Pt states that she has pain near her kidneys and burning while urinatimg.

## 2014-11-07 ENCOUNTER — Encounter (HOSPITAL_COMMUNITY): Payer: Self-pay | Admitting: *Deleted

## 2014-11-07 ENCOUNTER — Emergency Department (HOSPITAL_COMMUNITY): Payer: Medicare Other

## 2014-11-07 ENCOUNTER — Inpatient Hospital Stay (HOSPITAL_COMMUNITY)
Admission: EM | Admit: 2014-11-07 | Discharge: 2014-11-12 | DRG: 193 | Disposition: A | Discharge status: Skilled Nursing Facility | Payer: Medicare Other | Attending: Internal Medicine | Admitting: Internal Medicine

## 2014-11-07 DIAGNOSIS — I129 Hypertensive chronic kidney disease with stage 1 through stage 4 chronic kidney disease, or unspecified chronic kidney disease: Secondary | ICD-10-CM | POA: Diagnosis present

## 2014-11-07 DIAGNOSIS — D638 Anemia in other chronic diseases classified elsewhere: Secondary | ICD-10-CM | POA: Diagnosis present

## 2014-11-07 DIAGNOSIS — Z66 Do not resuscitate: Secondary | ICD-10-CM | POA: Diagnosis present

## 2014-11-07 DIAGNOSIS — Z7982 Long term (current) use of aspirin: Secondary | ICD-10-CM | POA: Diagnosis not present

## 2014-11-07 DIAGNOSIS — E876 Hypokalemia: Secondary | ICD-10-CM | POA: Diagnosis present

## 2014-11-07 DIAGNOSIS — J441 Chronic obstructive pulmonary disease with (acute) exacerbation: Secondary | ICD-10-CM | POA: Diagnosis present

## 2014-11-07 DIAGNOSIS — E43 Unspecified severe protein-calorie malnutrition: Secondary | ICD-10-CM | POA: Diagnosis present

## 2014-11-07 DIAGNOSIS — Y95 Nosocomial condition: Secondary | ICD-10-CM | POA: Diagnosis present

## 2014-11-07 DIAGNOSIS — Z9981 Dependence on supplemental oxygen: Secondary | ICD-10-CM

## 2014-11-07 DIAGNOSIS — K219 Gastro-esophageal reflux disease without esophagitis: Secondary | ICD-10-CM | POA: Diagnosis present

## 2014-11-07 DIAGNOSIS — I2510 Atherosclerotic heart disease of native coronary artery without angina pectoris: Secondary | ICD-10-CM | POA: Diagnosis present

## 2014-11-07 DIAGNOSIS — F0390 Unspecified dementia without behavioral disturbance: Secondary | ICD-10-CM | POA: Diagnosis present

## 2014-11-07 DIAGNOSIS — N184 Chronic kidney disease, stage 4 (severe): Secondary | ICD-10-CM | POA: Diagnosis present

## 2014-11-07 DIAGNOSIS — I5042 Chronic combined systolic (congestive) and diastolic (congestive) heart failure: Secondary | ICD-10-CM | POA: Diagnosis present

## 2014-11-07 DIAGNOSIS — G9341 Metabolic encephalopathy: Secondary | ICD-10-CM | POA: Diagnosis present

## 2014-11-07 DIAGNOSIS — Z681 Body mass index (BMI) 19 or less, adult: Secondary | ICD-10-CM

## 2014-11-07 DIAGNOSIS — Z87891 Personal history of nicotine dependence: Secondary | ICD-10-CM | POA: Diagnosis not present

## 2014-11-07 DIAGNOSIS — N390 Urinary tract infection, site not specified: Secondary | ICD-10-CM | POA: Diagnosis present

## 2014-11-07 DIAGNOSIS — J189 Pneumonia, unspecified organism: Secondary | ICD-10-CM | POA: Diagnosis not present

## 2014-11-07 DIAGNOSIS — B9620 Unspecified Escherichia coli [E. coli] as the cause of diseases classified elsewhere: Secondary | ICD-10-CM | POA: Diagnosis present

## 2014-11-07 DIAGNOSIS — J432 Centrilobular emphysema: Secondary | ICD-10-CM

## 2014-11-07 DIAGNOSIS — E860 Dehydration: Secondary | ICD-10-CM | POA: Diagnosis present

## 2014-11-07 DIAGNOSIS — J9621 Acute and chronic respiratory failure with hypoxia: Secondary | ICD-10-CM | POA: Diagnosis present

## 2014-11-07 DIAGNOSIS — E871 Hypo-osmolality and hyponatremia: Secondary | ICD-10-CM | POA: Diagnosis present

## 2014-11-07 DIAGNOSIS — J9611 Chronic respiratory failure with hypoxia: Secondary | ICD-10-CM | POA: Diagnosis present

## 2014-11-07 DIAGNOSIS — E785 Hyperlipidemia, unspecified: Secondary | ICD-10-CM | POA: Diagnosis present

## 2014-11-07 DIAGNOSIS — D696 Thrombocytopenia, unspecified: Secondary | ICD-10-CM | POA: Diagnosis present

## 2014-11-07 DIAGNOSIS — R509 Fever, unspecified: Secondary | ICD-10-CM | POA: Diagnosis not present

## 2014-11-07 DIAGNOSIS — N179 Acute kidney failure, unspecified: Secondary | ICD-10-CM | POA: Diagnosis present

## 2014-11-07 DIAGNOSIS — J449 Chronic obstructive pulmonary disease, unspecified: Secondary | ICD-10-CM | POA: Diagnosis present

## 2014-11-07 HISTORY — DX: Emphysema, unspecified: J43.9

## 2014-11-07 LAB — URINE MICROSCOPIC-ADD ON

## 2014-11-07 LAB — CBC
HEMATOCRIT: 34.3 % — AB (ref 36.0–46.0)
HEMOGLOBIN: 11.3 g/dL — AB (ref 12.0–15.0)
MCH: 30.1 pg (ref 26.0–34.0)
MCHC: 32.9 g/dL (ref 30.0–36.0)
MCV: 91.5 fL (ref 78.0–100.0)
Platelets: 172 10*3/uL (ref 150–400)
RBC: 3.75 MIL/uL — ABNORMAL LOW (ref 3.87–5.11)
RDW: 13 % (ref 11.5–15.5)
WBC: 8.9 10*3/uL (ref 4.0–10.5)

## 2014-11-07 LAB — BASIC METABOLIC PANEL
Anion gap: 14 (ref 5–15)
BUN: 32 mg/dL — ABNORMAL HIGH (ref 6–23)
CO2: 26 meq/L (ref 19–32)
Calcium: 9.1 mg/dL (ref 8.4–10.5)
Chloride: 93 mEq/L — ABNORMAL LOW (ref 96–112)
Creatinine, Ser: 1.77 mg/dL — ABNORMAL HIGH (ref 0.50–1.10)
GFR calc Af Amer: 28 mL/min — ABNORMAL LOW (ref >90)
GFR calc non Af Amer: 24 mL/min — ABNORMAL LOW (ref >90)
GLUCOSE: 145 mg/dL — AB (ref 70–99)
POTASSIUM: 3.7 meq/L (ref 3.7–5.3)
SODIUM: 133 meq/L — AB (ref 137–147)

## 2014-11-07 LAB — URINALYSIS, ROUTINE W REFLEX MICROSCOPIC
Bilirubin Urine: NEGATIVE
Glucose, UA: NEGATIVE mg/dL
Ketones, ur: NEGATIVE mg/dL
NITRITE: NEGATIVE
PH: 5.5 (ref 5.0–8.0)
Protein, ur: 100 mg/dL — AB
SPECIFIC GRAVITY, URINE: 1.012 (ref 1.005–1.030)
Urobilinogen, UA: 0.2 mg/dL (ref 0.0–1.0)

## 2014-11-07 LAB — I-STAT CG4 LACTIC ACID, ED: LACTIC ACID, VENOUS: 1.07 mmol/L (ref 0.5–2.2)

## 2014-11-07 LAB — I-STAT CHEM 8, ED
BUN: 33 mg/dL — ABNORMAL HIGH (ref 6–23)
CALCIUM ION: 1.08 mmol/L — AB (ref 1.13–1.30)
CREATININE: 1.9 mg/dL — AB (ref 0.50–1.10)
Chloride: 94 mEq/L — ABNORMAL LOW (ref 96–112)
GLUCOSE: 146 mg/dL — AB (ref 70–99)
HCT: 38 % (ref 36.0–46.0)
HEMOGLOBIN: 12.9 g/dL (ref 12.0–15.0)
Potassium: 3.6 mEq/L — ABNORMAL LOW (ref 3.7–5.3)
SODIUM: 131 meq/L — AB (ref 137–147)
TCO2: 26 mmol/L (ref 0–100)

## 2014-11-07 MED ORDER — VANCOMYCIN HCL IN DEXTROSE 1-5 GM/200ML-% IV SOLN
1000.0000 mg | Freq: Once | INTRAVENOUS | Status: DC
Start: 2014-11-07 — End: 2014-11-07

## 2014-11-07 MED ORDER — CLOPIDOGREL BISULFATE 75 MG PO TABS
75.0000 mg | ORAL_TABLET | Freq: Every morning | ORAL | Status: DC
  Administered 2014-11-08: 75 mg via ORAL
  Filled 2014-11-07: qty 1

## 2014-11-07 MED ORDER — CARBOXYMETHYLCELLUL-GLYCERIN 0.5-0.9 % OP SOLN: 1.0000 [drp] | Freq: Four times a day (QID) | OPHTHALMIC | Status: DC

## 2014-11-07 MED ORDER — LIP MEDEX EX OINT
TOPICAL_OINTMENT | CUTANEOUS | Status: DC | PRN
Start: 2014-11-07 — End: 2014-11-12
  Administered 2014-11-07: 23:00:00 via TOPICAL
  Filled 2014-11-07: qty 7

## 2014-11-07 MED ORDER — SALINE SPRAY 0.65 % NA SOLN: 2.0000 | NASAL | Status: DC | PRN

## 2014-11-07 MED ORDER — LEVOFLOXACIN IN D5W 500 MG/100ML IV SOLN
500.0000 mg | INTRAVENOUS | Status: DC
  Administered 2014-11-09 – 2014-11-11 (×2): 500 mg via INTRAVENOUS
  Filled 2014-11-07 (×2): qty 100

## 2014-11-07 MED ORDER — ASPIRIN 81 MG PO CHEW
81.0000 mg | CHEWABLE_TABLET | Freq: Every morning | ORAL | Status: DC
  Administered 2014-11-08 – 2014-11-12 (×5): 81 mg via ORAL
  Filled 2014-11-07 (×5): qty 1

## 2014-11-07 MED ORDER — PRAVASTATIN SODIUM 10 MG PO TABS
10.0000 mg | ORAL_TABLET | Freq: Every day | ORAL | Status: DC
  Administered 2014-11-07 – 2014-11-11 (×5): 10 mg via ORAL
  Filled 2014-11-07 (×6): qty 1

## 2014-11-07 MED ORDER — POLYVINYL ALCOHOL 1.4 % OP SOLN
1.0000 [drp] | OPHTHALMIC | Status: DC | PRN
  Filled 2014-11-07: qty 15

## 2014-11-07 MED ORDER — METOPROLOL TARTRATE 25 MG PO TABS
25.0000 mg | ORAL_TABLET | Freq: Two times a day (BID) | ORAL | Status: DC
  Administered 2014-11-08 – 2014-11-12 (×9): 25 mg via ORAL
  Filled 2014-11-07 (×11): qty 1

## 2014-11-07 MED ORDER — DEXTROMETHORPHAN POLISTIREX 30 MG/5ML PO LQCR
15.0000 mg | Freq: Every evening | ORAL | Status: DC | PRN
  Administered 2014-11-07 – 2014-11-08 (×2): 15 mg via ORAL
  Filled 2014-11-07 (×4): qty 5

## 2014-11-07 MED ORDER — PROMETHAZINE HCL 25 MG PO TABS
12.5000 mg | ORAL_TABLET | Freq: Four times a day (QID) | ORAL | Status: DC | PRN
  Filled 2014-11-07: qty 1

## 2014-11-07 MED ORDER — ALBUTEROL SULFATE (2.5 MG/3ML) 0.083% IN NEBU
2.5000 mg | INHALATION_SOLUTION | Freq: Four times a day (QID) | RESPIRATORY_TRACT | Status: DC
  Administered 2014-11-07 – 2014-11-08 (×2): 2.5 mg via RESPIRATORY_TRACT
  Filled 2014-11-07 (×2): qty 3

## 2014-11-07 MED ORDER — VANCOMYCIN HCL 500 MG IV SOLR
500.0000 mg | INTRAVENOUS | Status: DC
  Administered 2014-11-09: 500 mg via INTRAVENOUS
  Filled 2014-11-07: qty 500

## 2014-11-07 MED ORDER — DONEPEZIL HCL 5 MG PO TABS
5.0000 mg | ORAL_TABLET | Freq: Every day | ORAL | Status: DC
  Administered 2014-11-07 – 2014-11-11 (×5): 5 mg via ORAL
  Filled 2014-11-07 (×6): qty 1

## 2014-11-07 MED ORDER — MIRTAZAPINE 7.5 MG PO TABS
7.5000 mg | ORAL_TABLET | Freq: Every day | ORAL | Status: DC
  Administered 2014-11-07 – 2014-11-11 (×5): 7.5 mg via ORAL
  Filled 2014-11-07 (×6): qty 1

## 2014-11-07 MED ORDER — PANTOPRAZOLE SODIUM 40 MG PO TBEC
40.0000 mg | DELAYED_RELEASE_TABLET | Freq: Every day | ORAL | Status: DC
  Administered 2014-11-08 – 2014-11-12 (×5): 40 mg via ORAL
  Filled 2014-11-07 (×6): qty 1

## 2014-11-07 MED ORDER — SENNA 8.6 MG PO TABS: 1.0000 | ORAL_TABLET | ORAL | Status: DC | PRN

## 2014-11-07 MED ORDER — ACETAMINOPHEN 325 MG PO TABS
650.0000 mg | ORAL_TABLET | Freq: Four times a day (QID) | ORAL | Status: DC | PRN
  Administered 2014-11-12: 650 mg via ORAL
  Filled 2014-11-07: qty 2

## 2014-11-07 MED ORDER — ALPRAZOLAM 0.5 MG PO TABS
0.5000 mg | ORAL_TABLET | Freq: Two times a day (BID) | ORAL | Status: DC
  Administered 2014-11-07 – 2014-11-12 (×10): 0.5 mg via ORAL
  Filled 2014-11-07 (×10): qty 1

## 2014-11-07 MED ORDER — SODIUM CHLORIDE 0.9 % IV SOLN
INTRAVENOUS | Status: DC
  Administered 2014-11-07 – 2014-11-08 (×3): via INTRAVENOUS
  Administered 2014-11-09: 75 mL/h via INTRAVENOUS
  Administered 2014-11-10 – 2014-11-11 (×3): via INTRAVENOUS

## 2014-11-07 MED ORDER — NITROGLYCERIN 0.2 MG/HR TD PT24
0.2000 mg | MEDICATED_PATCH | TRANSDERMAL | Status: DC
  Administered 2014-11-08 – 2014-11-12 (×5): 0.2 mg via TRANSDERMAL
  Filled 2014-11-07 (×5): qty 1

## 2014-11-07 MED ORDER — VANCOMYCIN HCL IN DEXTROSE 750-5 MG/150ML-% IV SOLN
750.0000 mg | INTRAVENOUS | Status: AC
  Administered 2014-11-07: 750 mg via INTRAVENOUS
  Filled 2014-11-07: qty 150

## 2014-11-07 MED ORDER — HEPARIN SODIUM (PORCINE) 5000 UNIT/ML IJ SOLN
5000.0000 [IU] | Freq: Three times a day (TID) | INTRAMUSCULAR | Status: DC
Start: 2014-11-07 — End: 2014-11-07

## 2014-11-07 MED ORDER — LEVOFLOXACIN IN D5W 750 MG/150ML IV SOLN
750.0000 mg | Freq: Once | INTRAVENOUS | Status: AC
  Administered 2014-11-07: 750 mg via INTRAVENOUS
  Filled 2014-11-07: qty 150

## 2014-11-07 MED ORDER — TIZANIDINE HCL 4 MG PO TABS
4.0000 mg | ORAL_TABLET | Freq: Two times a day (BID) | ORAL | Status: DC
  Administered 2014-11-07 – 2014-11-12 (×10): 4 mg via ORAL
  Filled 2014-11-07 (×11): qty 1

## 2014-11-07 MED ORDER — TRAMADOL HCL 50 MG PO TABS
50.0000 mg | ORAL_TABLET | Freq: Three times a day (TID) | ORAL | Status: DC | PRN
  Administered 2014-11-09 – 2014-11-12 (×3): 50 mg via ORAL
  Filled 2014-11-07 (×3): qty 1

## 2014-11-07 MED ORDER — MELATONIN 1 MG PO TABS: 1.0000 mg | ORAL_TABLET | Freq: Every day | ORAL | Status: DC

## 2014-11-07 MED ORDER — ASPIRIN-DIPYRIDAMOLE ER 25-200 MG PO CP12
1.0000 | ORAL_CAPSULE | Freq: Two times a day (BID) | ORAL | Status: DC
  Administered 2014-11-07 – 2014-11-08 (×2): 1 via ORAL
  Filled 2014-11-07 (×3): qty 1

## 2014-11-07 MED ORDER — SODIUM CHLORIDE 0.9 % IV BOLUS (SEPSIS)
500.0000 mL | Freq: Once | INTRAVENOUS | Status: AC
  Administered 2014-11-07: 500 mL via INTRAVENOUS

## 2014-11-07 NOTE — Progress Notes (Signed)
PHARMACIST - PHYSICIAN ORDER COMMUNICATION  CONCERNING: P&T Medication Policy on Herbal Medications  DESCRIPTION:  This patient's order for:  Melatonin  has been noted.  This product(s) is classified as an "herbal" or natural product. Due to a lack of definitive safety studies or FDA approval, nonstandard manufacturing practices, plus the potential risk of unknown drug-drug interactions while on inpatient medications, the Pharmacy and Therapeutics Committee does not permit the use of "herbal" or natural products of this type within [facility].   ACTION TAKEN: The pharmacy department is unable to verify this order at this time and your patient has been informed of this safety policy. Please reevaluate patient's clinical condition at discharge and address if the herbal or natural product(s) should be resumed at that time.  Thank you, Pharmacy [phone] 11/07/2014 8:23 PM

## 2014-11-07 NOTE — Progress Notes (Signed)
ANTIBIOTIC CONSULT NOTE - INITIAL  Pharmacy Consult for Vancomycin and Levaquin Indication: pneumonia, sepsis  Allergies  Allergen Reactions  . Altace [Ramipril] Other (See Comments)    Reaction unknown  . Ambien [Zolpidem Tartrate] Other (See Comments)    Reaction unknown  . Codeine Other (See Comments)    Reaction unknown  . Demerol [Meperidine] Other (See Comments)    Reaction unknown  . Penicillins Other (See Comments)    Reaction unknown  . Sulfa Antibiotics Other (See Comments)    Reaction unknown  . Versed [Midazolam] Other (See Comments)    Reaction unknown    Patient Measurements:   Height: 60 inches Weight: 39.5 kg  Vital Signs: Temp: 101.2 F (38.4 C) (11/15 1800) Temp Source: Oral (11/15 1800) BP: 181/58 mmHg (11/15 1800) Pulse Rate: 95 (11/15 1800) Intake/Output from previous day:   Intake/Output from this shift:    Labs:  Recent Labs  11/07/14 1834 11/07/14 1849  WBC 8.9  --   HGB 11.3* 12.9  PLT 172  --   CREATININE  --  1.90*   Estimated Creatinine Clearance: 12.3 mL/min (by C-G formula based on Cr of 1.9). No results for input(s): VANCOTROUGH, VANCOPEAK, VANCORANDOM, GENTTROUGH, GENTPEAK, GENTRANDOM, TOBRATROUGH, TOBRAPEAK, TOBRARND, AMIKACINPEAK, AMIKACINTROU, AMIKACIN in the last 72 hours.   Microbiology: Recent Results (from the past 720 hour(s))  Urine culture     Status: None (Preliminary result)   Collection Time: 11/06/14  3:24 PM  Result Value Ref Range Status   Specimen Description URINE, CLEAN CATCH  Final   Special Requests NONE  Final   Culture  Setup Time   Final    11/06/2014 20:50 Performed at [facility]    Colony Count   Final    >=100,000 COLONIES/ML Performed at [facility]    Culture   Final    GRAM NEGATIVE RODS Performed at [facility]    Report Status PENDING  Incomplete    Medical History: Past Medical History  Diagnosis Date  . Hypertension   . Peripheral arterial  disease   . Cognitive disorder   . Esophageal stricture     prior dilatation in June 2013  . Osteoarthritis   . Anxiety   . UTI (lower urinary tract infection) June 2013  . COPD (chronic obstructive pulmonary disease)     acute respiratory failure November 2013 with PNA  . NSTEMI (non-ST elevated myocardial infarction) November 2013    felt to be due to demand ischemia; managed conservatively; not a candidate for invasive testing due to general frailty  . IBS (irritable bowel syndrome)   . Acute combined systolic and diastolic HF (heart failure)     EF is 40 to 45% per echo November 2013  . Anemia     uncertain etiology  . Dementia   . Anxiety   . Sacral decubitus ulcer   . Necrotic pneumonia     Medications:  Scheduled:  . vancomycin  750 mg Intravenous STAT   Infusions:  . levofloxacin (LEVAQUIN) IV    . sodium chloride     PRN:   Assessment: 89 yof from ALF brought to ED with fever, SHOB and lower back pain.  Went to Urgent Care yesterday and started on cipro for presumed UTI. Code Sepsis called and Pharmacy asked to dose vancomycin and levaquin for urosepsis and RLL PNA.  11/14 >> Cipro >> 11/15 11/15 >> Vancomycin >> 11/15 >> Levaquin >>  Tmax: 101.2 WBC: 8.9k Renal: SCr 1.9 (  previous values ~1.5), CrCl 22 ml/min (N), 12 ml/min (CG)  11/14 urine: >100 GNR (go to chart review tab to see) 11/15 blood:  Goal of Therapy:  Vancomycin troughs 15-20 mcg/ml Levaquin dose per renal function Eradication of infection  Plan:   Vancomycin 750mg  IV x 1, then 500mg  IV q48h Check trough at steady state Levaquin 750mg  IV x 1, then 500mg  IV q48h Follow up renal function & cultures, clinical course  Loralee PacasErin Constantino Starace, PharmD, BCPS Pager: [phone] 11/07/2014,7:02 PM

## 2014-11-07 NOTE — ED Notes (Addendum)
Pt received 1000 mg Tylenol en route from EMS

## 2014-11-07 NOTE — ED Notes (Signed)
Per EMS, [facility].  Pt reports is currently being treated for UTI with cipro.  Pt reports SOB and lower back pain.  Started to have a fever today.  Pt is alert and oriented per norm.  Pt wears O2 all the time.

## 2014-11-07 NOTE — ED Provider Notes (Signed)
CSN: [id number]     Arrival date & time 11/07/14  1747 History   First MD Initiated Contact with Patient 11/07/14 1808     Chief Complaint  Patient presents with  . Shortness of Breath     (Consider location/radiation/quality/duration/timing/severity/associated sxs/prior Treatment) HPI  80 year old female presents with a fever over the past 24 hours. For the past few days she's been complaining of dysuria. Per the daughter, the patient went to urgent care yesterday, was diagnosed with UTI, and given Cipro. She's taken 2 doses of this. Started getting a fever last night. No vomiting. Over the past 24 hours she's also developed a cough. The patient has been a little more confused than normal over this time as well. The patient is a DO NOT RESUSCITATE. At this point family does want patient to continue to have IV antibiotics and fluids as needed, including hospital admission.  Past Medical History  Diagnosis Date  . Hypertension   . Peripheral arterial disease   . Cognitive disorder   . Esophageal stricture     prior dilatation in June 2013  . Osteoarthritis   . Anxiety   . UTI (lower urinary tract infection) June 2013  . COPD (chronic obstructive pulmonary disease)     acute respiratory failure November 2013 with PNA  . NSTEMI (non-ST elevated myocardial infarction) November 2013    felt to be due to demand ischemia; managed conservatively; not a candidate for invasive testing due to general frailty  . IBS (irritable bowel syndrome)   . Acute combined systolic and diastolic HF (heart failure)     EF is 40 to 45% per echo November 2013  . Anemia     uncertain etiology  . Dementia   . Anxiety   . Sacral decubitus ulcer   . Necrotic pneumonia    Past Surgical History  Procedure Laterality Date  . Femoral bypass    . Esophagogastroduodenoscopy  06/21/2012    Procedure: ESOPHAGOGASTRODUODENOSCOPY (EGD);  Surgeon: Theda BelfastPatrick D Hung, MD;  [facility];  Service: Endoscopy;   Laterality: N/A;   Family History  Problem Relation Age of Onset  . Coronary artery disease Father   . Coronary artery disease Mother   . Hypertension Mother   . Breast cancer Maternal Grandmother   . Breast cancer Maternal Aunt    History  Substance Use Topics  . Smoking status: Former Smoker    Quit date: 12/24/1996  . Smokeless tobacco: Never Used     Comment: per dtr, pt quit tobacco in 1998.  Marland Kitchen. Alcohol Use: No   OB History    No data available     Review of Systems  Unable to perform ROS: Dementia      Allergies  Altace; Ambien; Codeine; Demerol; Penicillins; Sulfa antibiotics; and Versed  Home Medications   Prior to Admission medications   Medication Sig Start Date End Date Taking? Authorizing Provider  albuterol (PROVENTIL) (2.5 MG/3ML) 0.083% nebulizer solution Take 2.5 mg by nebulization 4 (four) times daily.    Historical Provider, MD  aspirin 81 MG chewable tablet Chew 81 mg by mouth every morning.     Historical Provider, MD  budesonide (ENTOCORT EC) 3 MG 24 hr capsule Take 9 mg by mouth daily.    Historical Provider, MD  Carboxymethylcellul-Glycerin 0.5-0.9 % SOLN Apply 1 drop to eye 4 (four) times daily.    Historical Provider, MD  ciprofloxacin (CIPRO) 250 MG tablet Take 1 tablet (250 mg total) by mouth 2 (two) times  daily. 11/06/14   Rodolph BongEvan S Corey, MD  clopidogrel (PLAVIX) 75 MG tablet Take 75 mg by mouth every morning.     Historical Provider, MD  donepezil (ARICEPT) 5 MG tablet Take 5 mg by mouth at bedtime.    Historical Provider, MD  furosemide (LASIX) 20 MG tablet Take 20 mg by mouth See admin instructions. Take 1 tablet by mouth on Monday, Wednesday, Friday, and Saturday.    Historical Provider, MD  HYDROcodone-acetaminophen (NORCO/VICODIN) 5-325 MG per tablet Take 1 tablet by mouth every 6 (six) hours as needed for moderate pain. 08/09/14   Lorenda HatchetAdam L Rothman, MD  Melatonin 1 MG TABS Take 1 mg by mouth at bedtime.    Historical Provider, MD  meloxicam  (MOBIC) 7.5 MG tablet Take 1 tablet (7.5 mg total) by mouth daily. 08/09/14   Lorenda HatchetAdam L Rothman, MD  methocarbamol (ROBAXIN) 500 MG tablet Take 2 tablets (1,000 mg total) by mouth 3 (three) times daily as needed for muscle spasms. 08/09/14   Lorenda HatchetAdam L Rothman, MD  mirtazapine (REMERON) 7.5 MG tablet Take 7.5 mg by mouth at bedtime.    Historical Provider, MD  nitroGLYCERIN (NITROSTAT) 0.4 MG SL tablet Place 1 tablet (0.4 mg total) under the tongue every 5 (five) minutes as needed for chest pain (for break through chest pain in addition to nitro patch). 01/28/13   Vesta MixerPhilip J Nahser, MD  pantoprazole (PROTONIX) 40 MG tablet Take 40 mg by mouth daily before breakfast.    Historical Provider, MD  pravastatin (PRAVACHOL) 10 MG tablet Take 10 mg by mouth at bedtime.     Historical Provider, MD  promethazine (PHENERGAN) 12.5 MG tablet Take 12.5 mg by mouth every 6 (six) hours as needed for nausea or vomiting.     Historical Provider, MD  psyllium (METAMUCIL) 58.6 % packet Take 1 packet by mouth daily.    Historical Provider, MD  sodium chloride (OCEAN) 0.65 % SOLN nasal spray Place 2 sprays into both nostrils as needed for congestion.    Historical Provider, MD  traMADol (ULTRAM) 50 MG tablet Take 50 mg by mouth every 8 (eight) hours as needed for moderate pain.    Historical Provider, MD   BP 181/58 mmHg  Pulse 95  Temp(Src) 101.2 F (38.4 C) (Oral)  Resp 28  SpO2 94% Physical Exam  Constitutional: She appears well-developed and well-nourished. No distress.  HENT:  Head: Normocephalic and atraumatic.  Right Ear: External ear normal.  Left Ear: External ear normal.  Nose: Nose normal.  Eyes: Right eye exhibits no discharge. Left eye exhibits no discharge.  Cardiovascular: Normal rate, regular rhythm and normal heart sounds.   Pulmonary/Chest: Effort normal. No accessory muscle usage. Tachypnea (mild) noted. No respiratory distress. She has rales.  Abdominal: Soft. There is no tenderness.  Neurological: She  is alert. She is disoriented.  Skin: Skin is warm and dry. She is not diaphoretic.  Nursing note and vitals reviewed.   ED Course  Procedures (including critical care time) Labs Review Labs Reviewed  BASIC METABOLIC PANEL - Abnormal; Notable for the following:    Sodium 133 (*)    Chloride 93 (*)    Glucose, Bld 145 (*)    BUN 32 (*)    Creatinine, Ser 1.77 (*)    GFR calc non Af Amer 24 (*)    GFR calc Af Amer 28 (*)    All other components within normal limits  CBC - Abnormal; Notable for the following:    RBC 3.75 (*)  Hemoglobin 11.3 (*)    HCT 34.3 (*)    All other components within normal limits  URINALYSIS, ROUTINE W REFLEX MICROSCOPIC - Abnormal; Notable for the following:    APPearance CLOUDY (*)    Hgb urine dipstick LARGE (*)    Protein, ur 100 (*)    Leukocytes, UA SMALL (*)    All other components within normal limits  I-STAT CHEM 8, ED - Abnormal; Notable for the following:    Sodium 131 (*)    Potassium 3.6 (*)    Chloride 94 (*)    BUN 33 (*)    Creatinine, Ser 1.90 (*)    Glucose, Bld 146 (*)    Calcium, Ion 1.08 (*)    All other components within normal limits  CULTURE, BLOOD (ROUTINE X 2)  CULTURE, BLOOD (ROUTINE X 2)  CULTURE, EXPECTORATED SPUTUM-ASSESSMENT  GRAM STAIN  URINE MICROSCOPIC-ADD ON  HIV ANTIBODY (ROUTINE TESTING)  LEGIONELLA ANTIGEN, URINE  STREP PNEUMONIAE URINARY ANTIGEN  CBC  BASIC METABOLIC PANEL  I-STAT TROPOININ, ED  I-STAT CG4 LACTIC ACID, ED    Imaging Review Dg Chest 2 View (if Patient Has Fever And/or Copd)  11/07/2014   CLINICAL DATA:  Productive cough. COPD. Shortness of breath. Initial encounter.  EXAM: CHEST  2 VIEW  COMPARISON:  08/09/2014.  FINDINGS: RIGHT lower lobe pneumonia is present with patchy airspace disease seen on the frontal and lateral projections. Increased density over the lower thoracic spine on the lateral view. The cardiopericardial silhouette is mildly enlarged for projection. Monitoring leads  project over the chest. Underlying emphysema.  IMPRESSION: RIGHT lower lobe pneumonia. Followup in 4-6 weeks to ensure radiographic clearing and exclude an underlying lesion is recommended.   Electronically Signed   By: Andreas Newport M.D.   On: 11/07/2014 18:22     EKG Interpretation   Date/Time:  Sunday November 07 2014 18:00:50 EST Ventricular Rate:  96 PR Interval:  147 QRS Duration: 77 QT Interval:  344 QTC Calculation: 435 R Axis:   4 Text Interpretation:  Ectopic atrial rhythm Left ventricular hypertrophy  Repol abnrm suggests ischemia, diffuse leads No significant change since  last tracing Confirmed by Tatsuya Okray  MD, Sonni Barse ([id number]) on 11/07/2014 6:24:50  PM      MDM   Final diagnoses:  HCAP (healthcare-associated pneumonia)  UTI (lower urinary tract infection)    With fever, tachypnea, and HR >90 patient meets criteria for sepsis. Patient is stable on chronic O2. BP hypertensive. Given small amount of fluids for sepsis but will be cautious given heart failure history. Given broad abx for HCAP and urine given early urine culture results from yesterday showing >100,000 gram neg rods.     Audree Camel, MD 11/07/14 [phone]

## 2014-11-07 NOTE — H&P (Signed)
[facility] History and Physical  [name] ZOX:[id number] DOB: 10/17/1922 DOA: 11/07/2014  Referring physician: EDP PCP: Florentina JennyRIPP, HENRY, MD   Chief Complaint: SOB   HPI: [name] is a 80 y.o. female who was started on cipro for UTI a few days ago returns after developing SOB and fevers for the past 24 hours.  More confusion than normal over this time period per daughter (although patient seems fairly with it when I am talking with her).  Nothing makes symptoms better or worse.  Review of Systems: Systems reviewed.  As above, otherwise negative  Past Medical History  Diagnosis Date  . Hypertension   . Peripheral arterial disease   . Cognitive disorder   . Esophageal stricture     prior dilatation in June 2013  . Osteoarthritis   . Anxiety   . UTI (lower urinary tract infection) June 2013  . COPD (chronic obstructive pulmonary disease)     acute respiratory failure November 2013 with PNA  . NSTEMI (non-ST elevated myocardial infarction) November 2013    felt to be due to demand ischemia; managed conservatively; not a candidate for invasive testing due to general frailty  . IBS (irritable bowel syndrome)   . Acute combined systolic and diastolic HF (heart failure)     EF is 40 to 45% per echo November 2013  . Anemia     uncertain etiology  . Dementia   . Anxiety   . Sacral decubitus ulcer   . Necrotic pneumonia    Past Surgical History  Procedure Laterality Date  . Femoral bypass    . Esophagogastroduodenoscopy  06/21/2012    Procedure: ESOPHAGOGASTRODUODENOSCOPY (EGD);  Surgeon: Theda BelfastPatrick D Hung, MD;  Location: [facility];  Service: Endoscopy;  Laterality: N/A;   Social History:  reports that she quit smoking about 17 years ago. She has never used smokeless tobacco. She reports that she does not drink alcohol or use illicit drugs.  Allergies  Allergen Reactions  . Altace [Ramipril] Other (See Comments)    Reaction unknown  . Ambien [Zolpidem Tartrate] Other  (See Comments)    Reaction unknown  . Codeine Other (See Comments)    Reaction unknown  . Demerol [Meperidine] Other (See Comments)    Reaction unknown  . Penicillins Other (See Comments)    Reaction unknown  . Sulfa Antibiotics Other (See Comments)    Reaction unknown  . Versed [Midazolam] Other (See Comments)    Reaction unknown    Family History  Problem Relation Age of Onset  . Coronary artery disease Father   . Coronary artery disease Mother   . Hypertension Mother   . Breast cancer Maternal Grandmother   . Breast cancer Maternal Aunt      Prior to Admission medications   Medication Sig Start Date End Date Taking? Authorizing Provider  albuterol (PROVENTIL) (2.5 MG/3ML) 0.083% nebulizer solution Take 2.5 mg by nebulization 4 (four) times daily.   Yes Historical Provider, MD  ALPRAZolam Prudy Feeler(XANAX) 0.5 MG tablet Take 0.5 mg by mouth 2 (two) times daily.   Yes Historical Provider, MD  aspirin 81 MG chewable tablet Chew 81 mg by mouth every morning.    Yes Historical Provider, MD  Carboxymethylcellul-Glycerin 0.5-0.9 % SOLN Apply 1 drop to eye 4 (four) times daily.   Yes Historical Provider, MD  Cholecalciferol (VITAMIN D3) 400 UNITS CAPS Take 1 capsule by mouth daily.   Yes Historical Provider, MD  clopidogrel (PLAVIX) 75 MG tablet Take 75 mg by mouth every morning.  Yes Historical Provider, MD  dipyridamole-aspirin (AGGRENOX) 200-25 MG per 12 hr capsule Take 1 capsule by mouth 2 (two) times daily.   Yes Historical Provider, MD  donepezil (ARICEPT) 5 MG tablet Take 5 mg by mouth at bedtime.   Yes Historical Provider, MD  furosemide (LASIX) 20 MG tablet Take 20 mg by mouth See admin instructions. Take 1 tablet by mouth on Monday, Wednesday, Friday, and Saturday.   Yes Historical Provider, MD  HYDROcodone-acetaminophen (NORCO/VICODIN) 5-325 MG per tablet Take 1 tablet by mouth every 6 (six) hours as needed for moderate pain. Patient taking differently: Take 1 tablet by mouth 2 (two)  times daily.  08/09/14  Yes Lorenda HatchetAdam L Rothman, MD  Melatonin 1 MG TABS Take 1 mg by mouth at bedtime.   Yes Historical Provider, MD  metoprolol tartrate (LOPRESSOR) 25 MG tablet Take 25 mg by mouth 2 (two) times daily.   Yes Historical Provider, MD  mirtazapine (REMERON) 7.5 MG tablet Take 7.5 mg by mouth at bedtime.   Yes Historical Provider, MD  nitroGLYCERIN (NITRODUR - DOSED IN MG/24 HR) 0.2 mg/hr patch Place 0.2 mg onto the skin daily.   Yes Historical Provider, MD  nitroGLYCERIN (NITROSTAT) 0.4 MG SL tablet Place 1 tablet (0.4 mg total) under the tongue every 5 (five) minutes as needed for chest pain (for break through chest pain in addition to nitro patch). 01/28/13  Yes Vesta MixerPhilip J Nahser, MD  pantoprazole (PROTONIX) 40 MG tablet Take 40 mg by mouth daily before breakfast.   Yes Historical Provider, MD  pravastatin (PRAVACHOL) 10 MG tablet Take 10 mg by mouth at bedtime.    Yes Historical Provider, MD  promethazine (PHENERGAN) 12.5 MG tablet Take 12.5 mg by mouth every 6 (six) hours as needed for nausea or vomiting.    Yes Historical Provider, MD  senna (SENOKOT) 8.6 MG tablet Take 1 tablet by mouth as needed for constipation.   Yes Historical Provider, MD  sodium chloride (OCEAN) 0.65 % SOLN nasal spray Place 2 sprays into both nostrils as needed for congestion.   Yes Historical Provider, MD  tiZANidine (ZANAFLEX) 4 MG tablet Take 4 mg by mouth 2 (two) times daily.   Yes Historical Provider, MD  traMADol (ULTRAM) 50 MG tablet Take 50 mg by mouth every 8 (eight) hours as needed for moderate pain.   Yes Historical Provider, MD  Wheat Dextrin (BENEFIBER DRINK MIX PO) Take 10 mLs by mouth 2 (two) times daily.   Yes Historical Provider, MD   Physical Exam: Filed Vitals:   11/07/14 1936  BP: 146/54  Pulse: 87  Temp:   Resp: 20    BP 146/54 mmHg  Pulse 87  Temp(Src) 100 F (37.8 C) (Oral)  Resp 20  SpO2 94%  General Appearance:    Alert, oriented, no distress, appears stated age  Head:     Normocephalic, atraumatic  Eyes:    PERRL, EOMI, sclera non-icteric        Nose:   Nares without drainage or epistaxis. Mucosa, turbinates normal  Throat:   Moist mucous membranes. Oropharynx without erythema or exudate.  Neck:   Supple. No carotid bruits.  No thyromegaly.  No lymphadenopathy.   Back:     No CVA tenderness, no spinal tenderness  Lungs:     Diminished in R base  Chest wall:    No tenderness to palpitation  Heart:    Regular rate and rhythm without murmurs, gallops, rubs  Abdomen:     Soft, non-tender,  nondistended, normal bowel sounds, no organomegaly  Genitalia:    deferred  Rectal:    deferred  Extremities:   No clubbing, cyanosis or edema.  Pulses:   2+ and symmetric all extremities  Skin:   Skin color, texture, turgor normal, no rashes or lesions  Lymph nodes:   Cervical, supraclavicular, and axillary nodes normal  Neurologic:   CNII-XII intact. Normal strength, sensation and reflexes      throughout    Labs on Admission:  Basic Metabolic Panel:  Recent Labs Lab 11/07/14 1834 11/07/14 1849  NA 133* 131*  K 3.7 3.6*  CL 93* 94*  CO2 26  --   GLUCOSE 145* 146*  BUN 32* 33*  CREATININE 1.77* 1.90*  CALCIUM 9.1  --    Liver Function Tests: No results for input(s): AST, ALT, ALKPHOS, BILITOT, PROT, ALBUMIN in the last 168 hours. No results for input(s): LIPASE, AMYLASE in the last 168 hours. No results for input(s): AMMONIA in the last 168 hours. CBC:  Recent Labs Lab 11/07/14 1834 11/07/14 1849  WBC 8.9  --   HGB 11.3* 12.9  HCT 34.3* 38.0  MCV 91.5  --   PLT 172  --    Cardiac Enzymes: No results for input(s): CKTOTAL, CKMB, CKMBINDEX, TROPONINI in the last 168 hours.  BNP (last 3 results) No results for input(s): PROBNP in the last 8760 hours. CBG: No results for input(s): GLUCAP in the last 168 hours.  Radiological Exams on Admission: Dg Chest 2 View (if Patient Has Fever And/or Copd)  11/07/2014   CLINICAL DATA:  Productive cough.  COPD. Shortness of breath. Initial encounter.  EXAM: CHEST  2 VIEW  COMPARISON:  08/09/2014.  FINDINGS: RIGHT lower lobe pneumonia is present with patchy airspace disease seen on the frontal and lateral projections. Increased density over the lower thoracic spine on the lateral view. The cardiopericardial silhouette is mildly enlarged for projection. Monitoring leads project over the chest. Underlying emphysema.  IMPRESSION: RIGHT lower lobe pneumonia. Followup in 4-6 weeks to ensure radiographic clearing and exclude an underlying lesion is recommended.   Electronically Signed   By: Andreas Newport M.D.   On: 11/07/2014 18:22    EKG: Independently reviewed.  Assessment/Plan Principal Problem:   HCAP (healthcare-associated pneumonia) Active Problems:   AKI (acute kidney injury)   Chronic respiratory failure with hypoxia   COPD with emphysema   Chronic combined systolic and diastolic heart failure   UTI (lower urinary tract infection)   1. HCAP - RLL PNA 1. Treat as HCAP as she is at a ECF 2. Levaquin and vanc started by EDP, h/o PCN allergy will continue these, if patient worsens clinically then switch to primaxin (had back in 2013) and vanc empirically. 3. Cultures pending 4. Tylenol for fever 2. UTI - levaquin 3. AKI - hold lasix and gentle hydration with 75 cc/hr NS 4. Chronic CHF - watch carefully for signs of fluid overload 5. COPD with emphysema - continue scheduled neb treatments per home regimen     Code Status: DNR/DNI - yellow form comes with patient, EDP also discussed with daughter  Family Communication: No family at bedside Disposition Plan: Admit to inpatient   Time spent: 70 min  Kiyonna Tortorelli M. [facility] Pager [phone]  If 7AM-7PM, please contact the day team taking care of the patient [email or web address] Password [email or web address] 11/07/2014, 8:06 PM

## 2014-11-08 DIAGNOSIS — J438 Other emphysema: Secondary | ICD-10-CM

## 2014-11-08 LAB — LEGIONELLA ANTIGEN, URINE

## 2014-11-08 LAB — BASIC METABOLIC PANEL
ANION GAP: 12 (ref 5–15)
BUN: 29 mg/dL — ABNORMAL HIGH (ref 6–23)
CO2: 24 meq/L (ref 19–32)
CREATININE: 1.53 mg/dL — AB (ref 0.50–1.10)
Calcium: 8.4 mg/dL (ref 8.4–10.5)
Chloride: 97 mEq/L (ref 96–112)
GFR calc Af Amer: 33 mL/min — ABNORMAL LOW (ref >90)
GFR calc non Af Amer: 29 mL/min — ABNORMAL LOW (ref >90)
Glucose, Bld: 102 mg/dL — ABNORMAL HIGH (ref 70–99)
POTASSIUM: 3.5 meq/L — AB (ref 3.7–5.3)
Sodium: 133 mEq/L — ABNORMAL LOW (ref 137–147)

## 2014-11-08 LAB — URINE CULTURE: Colony Count: >=100000

## 2014-11-08 LAB — HIV ANTIBODY (ROUTINE TESTING W REFLEX): HIV 1&2 Ab, 4th Generation: NONREACTIVE

## 2014-11-08 LAB — CBC
HCT: 29.6 % — ABNORMAL LOW (ref 36.0–46.0)
Hemoglobin: 9.8 g/dL — ABNORMAL LOW (ref 12.0–15.0)
MCH: 30.7 pg (ref 26.0–34.0)
MCHC: 33.1 g/dL (ref 30.0–36.0)
MCV: 92.8 fL (ref 78.0–100.0)
PLATELETS: 137 10*3/uL — AB (ref 150–400)
RBC: 3.19 MIL/uL — ABNORMAL LOW (ref 3.87–5.11)
RDW: 13 % (ref 11.5–15.5)
WBC: 6.3 10*3/uL (ref 4.0–10.5)

## 2014-11-08 LAB — STREP PNEUMONIAE URINARY ANTIGEN: Strep Pneumo Urinary Antigen: NEGATIVE

## 2014-11-08 MED ORDER — IPRATROPIUM-ALBUTEROL 0.5-2.5 (3) MG/3ML IN SOLN
3.0000 mL | RESPIRATORY_TRACT | Status: DC | PRN
  Administered 2014-11-08 – 2014-11-10 (×3): 3 mL via RESPIRATORY_TRACT
  Filled 2014-11-08 (×3): qty 3

## 2014-11-08 MED ORDER — HYDRALAZINE HCL 20 MG/ML IJ SOLN
5.0000 mg | Freq: Four times a day (QID) | INTRAMUSCULAR | Status: DC | PRN
  Administered 2014-11-09: 5 mg via INTRAVENOUS
  Filled 2014-11-08: qty 1

## 2014-11-08 MED ORDER — POTASSIUM CHLORIDE 10 MEQ/100ML IV SOLN
10.0000 meq | INTRAVENOUS | Status: DC
  Filled 2014-11-08 (×3): qty 100

## 2014-11-08 MED ORDER — ENSURE COMPLETE PO LIQD
237.0000 mL | Freq: Two times a day (BID) | ORAL | Status: DC
  Administered 2014-11-09 – 2014-11-12 (×8): 237 mL via ORAL

## 2014-11-08 MED ORDER — POTASSIUM CHLORIDE CRYS ER 10 MEQ PO TBCR
30.0000 meq | EXTENDED_RELEASE_TABLET | Freq: Once | ORAL | Status: AC
  Administered 2014-11-08: 30 meq via ORAL
  Filled 2014-11-08 (×3): qty 1

## 2014-11-08 NOTE — Plan of Care (Signed)
Problem: Phase I Progression Outcomes Goal: Pain controlled Outcome: Completed/Met Date Met:  11/08/14     

## 2014-11-08 NOTE — Treatment Plan (Signed)
Pt unable to teach back respiratory treatment information.

## 2014-11-08 NOTE — Plan of Care (Signed)
Problem: Consults Goal: Case Management Consult (COPD) A. Determine the need for home health or [facility] care management. B. Determine PCP and / or establish PCP for patient if no PCP. C. Determine need and eligibility for Pulmonary Rehab and obtain referral. D. Coordinate home health services and PT/OT recommendations prior patient discharge.  Outcome: Progressing Goal: Ecologistocial Worker Consult (COPD) A. Perform depression and anxiety screening and results provided to attending. B. Assess patient for other needs that can be addressed by social worker.  Outcome: Progressing Goal: Physical Therapy Consult Outcome: Progressing Goal: Occupational Therapy Consult Outcome: Progressing Goal: Respiratory Therapy Consult Outcome: Progressing Goal: Dietician Consult Outcome: Progressing

## 2014-11-08 NOTE — Progress Notes (Signed)
INITIAL NUTRITION ASSESSMENT  Pt meets criteria for severe MALNUTRITION in the context of chronic illness as evidenced by intake <75% for > 1 month, decrease in muscle mass.  DOCUMENTATION CODES Per approved criteria  -Severe malnutrition in the context of chronic illness   INTERVENTION: Regular diet Ensure Complete po BID, each supplement provides 350 kcal and 13 grams of protein Mighty Shakes with meals. Encouraged intake Discussed patient's living concerns with RN. RD to follow.  NUTRITION DIAGNOSIS: Inadequate oral intake related to poor appetite as evidenced by report from staff and patient..   Goal: Intake of meals and supplements to meet >90% estimated needs.  Monitor:  Intake, labs, weight trend  Reason for Assessment: Consult for assessment of status and needs, consult for education, MST and low BMI  80 y.o. female  Admitting Dx: HCAP (healthcare-associated pneumonia)  ASSESSMENT: Patient admitted with HCAP. [facility]  States that husband has memory issues and he used to live at the memory unit on that campus.  He became ill and was placed at [facility].  She wishes that they could live in the same facility. Reports poor appetite for a while. She is to receive house shakes at the ALF twice daily but states that they are "always out" and she gets them twice weekly.  She states that she knows that she needs the nutrition. Likes Ensure. Poor intake currently and prior to admit.  Nutrition Focused Physical Exam:  Subcutaneous Fat:  Orbital Region: wnl Upper Arm Region: mild Thoracic and Lumbar Region: n/a  Muscle:  Temple Region: wnl Clavicle Bone Region: moderate Clavicle and Acromion Bone Region: moderate Scapular Bone Region: severe Dorsal Hand: severe Patellar Region: severe Anterior Thigh Region: mild/moderate Posterior Calf Region: moderate/severe  Edema: none noted    Height: Ht Readings from Last 1 Encounters:   11/07/14 5' (1.524 m)    Weight: Wt Readings from Last 1 Encounters:  11/07/14 87 lb 6.4 oz (39.644 kg)    Ideal Body Weight: 105 lbs  % Ideal Body Weight: 83  Wt Readings from Last 10 Encounters:  11/07/14 87 lb 6.4 oz (39.644 kg)  10/27/14 87 lb (39.463 kg)  04/20/14 89 lb (40.37 kg)  03/20/14 83 lb (37.649 kg)  03/04/14 90 lb (40.824 kg)  11/17/13 90 lb 3.2 oz (40.914 kg)  11/04/13 90 lb (40.824 kg)  01/28/13 91 lb 1.9 oz (41.332 kg)  12/26/12 90 lb 1.9 oz (40.878 kg)  12/13/12 90 lb 9.7 oz (41.1 kg)    Usual Body Weight: 88  % Usual Body Weight: 100  BMI:  Body mass index is 17.07 kg/(m^2).  Estimated Nutritional Needs: Kcal: 1100-1250 Protein: 50-60 gm Fluid: >1.2L daily  Skin: wnl  Diet Order: Diet regular  EDUCATION NEEDS: -Education needs addressed   Intake/Output Summary (Last 24 hours) at 11/08/14 1652 Last data filed at 11/08/14 1421  Gross per 24 hour  Intake 1248.75 ml  Output      0 ml  Net 1248.75 ml     Labs:   Recent Labs Lab 11/07/14 1834 11/07/14 1849 11/08/14 0425  NA 133* 131* 133*  K 3.7 3.6* 3.5*  CL 93* 94* 97  CO2 26  --  24  BUN 32* 33* 29*  CREATININE 1.77* 1.90* 1.53*  CALCIUM 9.1  --  8.4  GLUCOSE 145* 146* 102*    CBG (last 3)  No results for input(s): GLUCAP in the last 72 hours.  Scheduled Meds: . ALPRAZolam  0.5 mg  Oral BID  . aspirin  81 mg Oral q morning - 10a  . donepezil  5 mg Oral QHS  . [START ON 11/09/2014] levofloxacin (LEVAQUIN) IV  500 mg Intravenous Q48H  . metoprolol tartrate  25 mg Oral BID  . mirtazapine  7.5 mg Oral QHS  . nitroGLYCERIN  0.2 mg Transdermal Q24H  . pantoprazole  40 mg Oral QAC breakfast  . pravastatin  10 mg Oral QHS  . tiZANidine  4 mg Oral BID  . [START ON 11/09/2014] vancomycin  500 mg Intravenous Q48H    Continuous Infusions: . sodium chloride 75 mL/hr at 11/08/14 1116    Past Medical History  Diagnosis Date  . Hypertension   . Peripheral arterial disease    . Cognitive disorder   . Esophageal stricture     prior dilatation in June 2013  . Osteoarthritis   . Anxiety   . UTI (lower urinary tract infection) June 2013  . COPD (chronic obstructive pulmonary disease)     acute respiratory failure November 2013 with PNA  . NSTEMI (non-ST elevated myocardial infarction) November 2013    felt to be due to demand ischemia; managed conservatively; not a candidate for invasive testing due to general frailty  . IBS (irritable bowel syndrome)   . Acute combined systolic and diastolic HF (heart failure)     EF is 40 to 45% per echo November 2013  . Anemia     uncertain etiology  . Dementia   . Anxiety   . Sacral decubitus ulcer   . Necrotic pneumonia     Past Surgical History  Procedure Laterality Date  . Femoral bypass    . Esophagogastroduodenoscopy  06/21/2012    Procedure: ESOPHAGOGASTRODUODENOSCOPY (EGD);  Surgeon: Theda BelfastPatrick D Hung, MD;  Location: [facility];  Service: Endoscopy;  Laterality: N/A;    Oran ReinLaura Dameer Speiser, RD, LDN Clinical Inpatient Dietitian Pager:  [phone] Weekend and after hours pager:  [phone]

## 2014-11-08 NOTE — Plan of Care (Signed)
Problem: Phase I Progression Outcomes Goal: Tolerating diet Outcome: Completed/Met Date Met:  11/08/14

## 2014-11-08 NOTE — Progress Notes (Signed)
Patient ID: [name], female   DOB: 11/29/1922, 80 y.o.   MRN: [id number] [facility] PROGRESS NOTE  [name] [id number] DOB: 01/23/1922 DOA: 11/07/2014 PCP: Florentina JennyRIPP, HENRY, MD  Brief narrative: 80 y.o. female with past medical history of COPD, dyslipidemia, GERD, CAD (on aspirin, plavix, aggrenox), dementia, hypertension who [facility]07[facility]2014 with shortness of breath and fevers for past 24 hours. In addition, patient appears to be more confused than previously. Her family is not at the bedside to provide the details of the medical history.   Assessment/Plan:    Principal Problem: Acute respiratory failure with hypoxia / Healthcare associated pneumonia / COPD exacerbation - Patient is from nursing home and at high risk of healthcare associated pneumonia. Patient was hypoxic on the admission, oxygen saturation 93% on nasal cannula oxygen support. Hypoxia likely secondary to HCAP. - Patient was started on broad-spectrum antibiotics, Levaquin and vancomycin. - strep pneumonia is negative, legionella urine antigen is pending, blood cultures are pending - Continue oxygen support via nasal cannula to keep oxygen saturation above 90% - we will change nebulizer treatments to duoneb every 4 hours as needed. Active Problems: Chronic kidney disease, stage IV - Creatinine 2014 was 1.7 and creatinine on this admission around patient's baseline renal function. - Continue to monitor BMP UTI (lower urinary tract infection) - small leukocytes seen on admission urinalysis - Patient is on empiric antibiotics, Levaquin and vancomycin for pneumonia which would cover for urinary tract infection as well - Follow up urine culture results Chronic combined systolic and diastolic heart failure - continue current medications, metoprolol 25 mg by mouth twice a day, statin therapy. - Continue aspirin but hold Aggrenox and Plavix due to risk of bleeding. Please note hemoglobin drop noted since  admission, 12.9 down to 9.8. In addition, platelet count dropped from 172 down to 137. Acute metabolic encephalopathy - likely secondary to combination of urinary tract infection, pneumonia. No significant changes in mental status since admission. - continue Aricept - Physical therapy evaluation once patient able to participate Anemia of chronic disease - Secondary to history of chronic kidney disease - Hemoglobin dropped from 12.9 down to 9.8. Stop Plavix and Aggrenox - Continue to monitor CBC Thrombocytopenia - Platelet count dropped from 172 down to 137 since admission. Stop Plavix and Aggrenox. Continue to monitor for bleeding. Hypokalemia - Patient is on Lasix at home. This was put on hold because of hypotension.  - supplemented. Hyponatremia - Likely secondary to dehydration. Continue IV fluids. Essential hypertension - Continue metoprolol for now. Hold Lasix. Dyslipidemia - Continue Pravachol Severe protein calorie malnutrition - Nutrition consulted.  DVT Prophylaxis  - SCD"s bilaterally and aspirin   Code Status: DNR/DNI  Family Communication:  Family not at the bedside this morning. Disposition Plan: needs PT evaluation.  IV access:   Peripheral IV  Procedures and diagnostic studies:    Dg Chest 2 View (if Patient Has Fever And/or Copd) 11/07/2014  RIGHT lower lobe pneumonia. Followup in 4-6 weeks to ensure radiographic clearing and exclude an underlying lesion is recommended.     Medical Consultants:   None  Other Consultants:   Physical therapy   Nutrition  IAnti-Infectives:    Levaquin 11/07/2014 -->  Vanco 11/07/2014 -->   Manson PasseyEVINE, Rashauna Tep, MD  [facility] Pager [phone]  If 7PM-7AM, please contact night-coverage [email or web address] Password [email or web address] 11/08/2014, 11:05 AM   LOS: 1 day    HPI/Subjective: No acute overnight events.  Objective: Filed Vitals:   11/07/14 2003 11/07/14 2041  11/08/14 0621 11/08/14 0738  BP:  121/46 102/46   Pulse:   51 72 65  Temp:  98.6 F (37 C) 98.5 F (36.9 C)   TempSrc:  Oral Oral   Resp:  22 20 18   Height:  5' (1.524 m)    Weight:  39.644 kg (87 lb 6.4 oz)    SpO2: 94% 93% 100% 96%    Intake/Output Summary (Last 24 hours) at 11/08/14 1105 Last data filed at 11/08/14 0500  Gross per 24 hour  Intake 608.75 ml  Output      0 ml  Net 608.75 ml    Exam:   General:  Pt is not in acute distress  Cardiovascular: Rate controlled, S1/S2 appreciated   Respiratory: No wheezing, no rhonchi  Abdomen: non tender, non distended, bowel sounds present  Extremities: Pulses DP and PT palpable bilaterally  Neuro: Nonfocal  Data Reviewed: Basic Metabolic Panel:  Recent Labs Lab 11/07/14 1834 11/07/14 1849 11/08/14 0425  NA 133* 131* 133*  K 3.7 3.6* 3.5*  CL 93* 94* 97  CO2 26  --  24  GLUCOSE 145* 146* 102*  BUN 32* 33* 29*  CREATININE 1.77* 1.90* 1.53*  CALCIUM 9.1  --  8.4   Liver Function Tests: No results for input(s): AST, ALT, ALKPHOS, BILITOT, PROT, ALBUMIN in the last 168 hours. No results for input(s): LIPASE, AMYLASE in the last 168 hours. No results for input(s): AMMONIA in the last 168 hours. CBC:  Recent Labs Lab 11/07/14 1834 11/07/14 1849 11/08/14 0425  WBC 8.9  --  6.3  HGB 11.3* 12.9 9.8*  HCT 34.3* 38.0 29.6*  MCV 91.5  --  92.8  PLT 172  --  137*   Cardiac Enzymes: No results for input(s): CKTOTAL, CKMB, CKMBINDEX, TROPONINI in the last 168 hours. BNP: Invalid input(s): POCBNP CBG: No results for input(s): GLUCAP in the last 168 hours.  Urine culture     Status: None   Collection Time: 11/06/14  3:24 PM  Result Value Ref Range Status   Specimen Description URINE, CLEAN CATCH  Final    ESCHERICHIA COLI   Report Status 11/08/2014 FINAL  Final   Organism ID, Bacteria ESCHERICHIA COLI  Final      Susceptibility   Escherichia coli - MIC*    AMPICILLIN <=2 SENSITIVE Sensitive     CEFAZOLIN <=4 SENSITIVE Sensitive     CEFTRIAXONE <=1 SENSITIVE  Sensitive     CIPROFLOXACIN <=0.25 SENSITIVE Sensitive     GENTAMICIN <=1 SENSITIVE Sensitive     LEVOFLOXACIN <=0.12 SENSITIVE Sensitive     NITROFURANTOIN <=16 SENSITIVE Sensitive     TOBRAMYCIN <=1 SENSITIVE Sensitive     TRIMETH/SULFA <=20 SENSITIVE Sensitive     PIP/TAZO <=4 SENSITIVE Sensitive     * ESCHERICHIA COLI    Scheduled Meds: . albuterol  2.5 mg Nebulization QID  . ALPRAZolam  0.5 mg Oral BID  . aspirin  81 mg Oral q morning - 10a  . donepezil  5 mg Oral QHS  . [facility](LEVAQUIN) IV  500 mg Intravenous Q48H  . metoprolol tartrate  25 mg Oral BID  . mirtazapine  7.5 mg Oral QHS  . nitroGLYCERIN  0.2 mg Transdermal Q24H  . pantoprazole  40 mg Oral QAC breakfast  . potassium chloride  10 mEq Intravenous Q1 Hr x 3  . pravastatin  10 mg Oral QHS  . tiZANidine  4 mg Oral BID  .  vancomycin  500 mg Intravenous Q48H  Continuous Infusions: . sodium chloride 75 mL/hr at 11/07/14 2053

## 2014-11-08 NOTE — Progress Notes (Signed)
Clinical Social Work Department BRIEF PSYCHOSOCIAL ASSESSMENT 11/08/2014  Patient:  [name], [name]     Account Number:  [id number]     Admit date:  11/07/2014  Clinical Social Worker:  Maryln Manuel  Date/Time:  11/08/2014 10:21 AM  Referred by:  Physician  Date Referred:  11/08/2014 Referred for  SNF Placement   Other Referral:   Interview type:  Patient Other interview type:    PSYCHOSOCIAL DATA Living Status:  FACILITY Admitted from facility:  [facility] Level of care:  Assisted Living Primary support name:  [name]/daughter/[phone] Primary support relationship to patient:  CHILD, ADULT Degree of support available:   adequate    CURRENT CONCERNS Current Concerns  Post-Acute Placement   Other Concerns:    SOCIAL WORK ASSESSMENT / PLAN CSW received referral that pt admitted from [facility].    CSW met with pt at bedside. CSW introduced self and explained role. Pt confirmed that she is a resident at [facility]. Pt shared that she has been at ALF for 4 years. CSW provided supportive listening as pt discussed how she moved into [facility] and described how her husband is a resident at [facility]. Pt expressed strong support from pt children. Pt shared that she plans to return to [facility]F when medically ready for discharge.    CSW contacted [facility] [place] and spoke with RN, [name]. ALF RN confirmed that pt can return to ALF when medically stable for discharge.    CSW completed FL2 and will continue to follow to assist with pt return to [facility]F when medically stable.   Assessment/plan status:  Psychosocial Support/Ongoing Assessment of Needs Other assessment/ plan:   discharge planning   Information/referral to community resources:   Referral back to [facility].    PATIENT'S/FAMILY'S RESPONSE TO PLAN OF CARE: Pt  alert and oriented x 4. Pt pleasant and actively engaged in conversation. Pt is satisfied with the care she receives at [facility]F and plans to return to ALF when medically stable for discharge.    [name], MSW, [facility] [phone]

## 2014-11-09 DIAGNOSIS — E43 Unspecified severe protein-calorie malnutrition: Secondary | ICD-10-CM | POA: Diagnosis present

## 2014-11-09 MED ORDER — HYDRALAZINE HCL 20 MG/ML IJ SOLN: 10.0000 mg | Freq: Four times a day (QID) | INTRAMUSCULAR | Status: DC | PRN

## 2014-11-09 MED ORDER — AMLODIPINE BESYLATE 5 MG PO TABS
5.0000 mg | ORAL_TABLET | Freq: Every day | ORAL | Status: DC
  Administered 2014-11-09 – 2014-11-12 (×4): 5 mg via ORAL
  Filled 2014-11-09 (×4): qty 1

## 2014-11-09 MED ORDER — HYDRALAZINE HCL 10 MG PO TABS
10.0000 mg | ORAL_TABLET | Freq: Three times a day (TID) | ORAL | Status: DC
  Administered 2014-11-09 – 2014-11-12 (×9): 10 mg via ORAL
  Filled 2014-11-09 (×12): qty 1

## 2014-11-09 MED ORDER — IPRATROPIUM-ALBUTEROL 0.5-2.5 (3) MG/3ML IN SOLN
3.0000 mL | Freq: Four times a day (QID) | RESPIRATORY_TRACT | Status: DC
  Administered 2014-11-09 – 2014-11-12 (×13): 3 mL via RESPIRATORY_TRACT
  Filled 2014-11-09 (×13): qty 3

## 2014-11-09 NOTE — Progress Notes (Signed)
Patient ID: [name] Kinkade, female   DOB: 07/22/1922, 80 y.o.   MRN: [id number] [facility] PROGRESS NOTE  [name] Dede WUJ:[id number] DOB: 01/10/1922 DOA: 11/07/2014 PCP: Florentina JennyRIPP, HENRY, MD  Brief narrative: 80 y.o. female with past medical history of COPD, dyslipidemia, GERD, CAD (on aspirin, plavix, aggrenox), dementia, hypertension who [facility]07/2014 with shortness of breath and fevers for past 24 hours. In addition, patient appears to be more confused than previously. She is being treated with broad spectrum antibiotics for pneumonia and UTI.   Assessment/Plan:    Principal Problem: Acute respiratory failure with hypoxia / Healthcare associated pneumonia / COPD exacerbation - Patient is from nursing home and at high risk of healthcare associated pneumonia. Patient was hypoxic on the admission, oxygen saturation 93% on nasal cannula oxygen support. Hypoxia likely secondary to HCAP. - Patient was started on broad-spectrum antibiotics, Levaquin and vancomycin. - strep pneumonia is negative, legionella urine antigen is negative, blood cultures show no growth to date. - Continue oxygen support via nasal cannula to keep oxygen saturation above 90% - Continue duoneb every 4 hours as needed. Active Problems: Chronic kidney disease, stage IV - Creatinine in 2014 was 1.7 and creatinine on this admission around patient's baseline renal function. - Continue to monitor BMP UTI (lower urinary tract infection) secondary to E.Coli - small leukocytes seen on admission urinalysis - urine culture pan sensitive E.Coli; continue current antibiotics  Chronic combined systolic and diastolic heart failure - continue current medications, metoprolol 25 mg by mouth twice a day, statin therapy. - Continue aspirin but Aggrenox and Plavix on hold due to risk of bleeding. Please note hemoglobin drop noted since admission, 12.9 down to 9.8. In addition, platelet count dropped from 172 down to 137. -  follow up CBC in am. Acute metabolic encephalopathy - likely secondary to combination of urinary tract infection, pneumonia. No significant changes in mental status since admission. - continue Aricept - Physical therapy evaluation pending. Anemia of chronic disease - Secondary to history of chronic kidney disease - Hemoglobin dropped from 12.9 down to 9.8. Stopped Plavix and Aggrenox - follow up CBC in am. Thrombocytopenia - Platelet count dropped from 172 down to 137 since admission. Stopped Plavix and Aggrenox. Continue to monitor for bleeding. Hypokalemia - Patient is on Lasix at home. This was put on hold because of hypotension.  - supplemented. Hyponatremia - Likely secondary to dehydration. Continue IV fluids. Essential hypertension - Continue metoprolol for now. Start Norvasc and hydralazine. Would hold lasix due to renal insufficiency.  Dyslipidemia - Continue Pravachol Severe protein calorie malnutrition - Nutrition consulted.  DVT Prophylaxis  - SCD"s bilaterally and aspirin   Code Status: DNR/DNI  Family Communication: Family not at the bedside this morning. Disposition Plan: PT eval pending   IV access:   Peripheral IV  Procedures and diagnostic studies:   Dg Chest 2 View (if Patient Has Fever And/or Copd) 11/07/2014 RIGHT lower lobe pneumonia. Followup in 4-6 weeks to ensure radiographic clearing and exclude an underlying lesion is recommended.   Medical Consultants:   None  Other Consultants:   Physical therapy   Nutrition  IAnti-Infectives:    Levaquin 11/07/2014 -->  Vanco 11/07/2014 -->   Manson PasseyEVINE, Hughes Wyndham, MD  [facility] Pager [phone]  If 7PM-7AM, please contact night-coverage [email or web address] Password [email or web address] 11/09/2014, 11:33 AM   LOS: 2 days    HPI/Subjective: No acute overnight events.  Objective: Filed Vitals:   11/08/14 1926 11/08/14 2145 11/09/14 0601 11/09/14 0714  BP:  127/77 184/75 177/94  Pulse:   101 76   Temp: 99.7 F (37.6 C) 98.7 F (37.1 C) 99.8 F (37.7 C)   TempSrc: Oral Oral Oral   Resp:  18 21   Height:      Weight:      SpO2:  98% 94%     Intake/Output Summary (Last 24 hours) at 11/09/14 1133 Last data filed at 11/09/14 1046  Gross per 24 hour  Intake 1911.25 ml  Output    600 ml  Net 1311.25 ml    Exam:   General:  Pt is not in acute distress  Cardiovascular: Regular rate and rhythm, S1/S2 appreciated   Respiratory: no wheezing, no crackles, no rhonchi  Abdomen: non tender, non distended, bowel sounds present  Extremities: pulses DP and PT palpable bilaterally  Neuro: Grossly nonfocal  Data Reviewed: Basic Metabolic Panel:  Recent Labs Lab 11/07/14 1834 11/07/14 1849 11/08/14 0425  NA 133* 131* 133*  K 3.7 3.6* 3.5*  CL 93* 94* 97  CO2 26  --  24  GLUCOSE 145* 146* 102*  BUN 32* 33* 29*  CREATININE 1.77* 1.90* 1.53*  CALCIUM 9.1  --  8.4   Liver Function Tests: No results for input(s): AST, ALT, ALKPHOS, BILITOT, PROT, ALBUMIN in the last 168 hours. No results for input(s): LIPASE, AMYLASE in the last 168 hours. No results for input(s): AMMONIA in the last 168 hours. CBC:  Recent Labs Lab 11/07/14 1834 11/07/14 1849 11/08/14 0425  WBC 8.9  --  6.3  HGB 11.3* 12.9 9.8*  HCT 34.3* 38.0 29.6*  MCV 91.5  --  92.8  PLT 172  --  137*   Cardiac Enzymes: No results for input(s): CKTOTAL, CKMB, CKMBINDEX, TROPONINI in the last 168 hours. BNP: Invalid input(s): POCBNP CBG: No results for input(s): GLUCAP in the last 168 hours.  Recent Results (from the past 240 hour(s))  Urine culture     Status: None   Collection Time: 11/06/14  3:24 PM  Result Value Ref Range Status   Specimen Description URINE, CLEAN CATCH  Final   Special Requests NONE  Final   Culture  Setup Time   Final    11/06/2014 20:50 Performed at [facility] Count   Final    >=100,000 COLONIES/ML Performed at [facility]     Culture   Final    ESCHERICHIA COLI Performed at [facility]    Report Status 11/08/2014 FINAL  Final   Organism ID, Bacteria ESCHERICHIA COLI  Final      Susceptibility   Escherichia coli - MIC*    AMPICILLIN <=2 SENSITIVE Sensitive     CEFAZOLIN <=4 SENSITIVE Sensitive     CEFTRIAXONE <=1 SENSITIVE Sensitive     CIPROFLOXACIN <=0.25 SENSITIVE Sensitive     GENTAMICIN <=1 SENSITIVE Sensitive     LEVOFLOXACIN <=0.12 SENSITIVE Sensitive     NITROFURANTOIN <=16 SENSITIVE Sensitive     TOBRAMYCIN <=1 SENSITIVE Sensitive     TRIMETH/SULFA <=20 SENSITIVE Sensitive     PIP/TAZO <=4 SENSITIVE Sensitive     * ESCHERICHIA COLI  Blood culture (routine x 2)     Status: None (Preliminary result)   Collection Time: 11/07/14  6:35 PM  Result Value Ref Range Status   Specimen Description BLOOD BLOOD LEFT FOREARM  Final   Special Requests BOTTLES DRAWN AEROBIC AND ANAEROBIC 5 CC EACH  Final   Culture  Setup Time   Final  11/08/2014 03:31 [facility]formed at [facility]    Culture   Final           BLOOD CULTURE RECEIVED NO GROWTH TO DATE CULTURE WILL BE HELD FOR 5 DAYS BEFORE ISSUING A FINAL NEGATIVE REPORT Performed at [facility]    Report Status PENDING  Incomplete  Blood culture (routine x 2)     Status: None (Preliminary result)   Collection Time: 11/07/14  6:47 PM  Result Value Ref Range Status   Specimen Description BLOOD L HAND  Final   Special Requests BOTTLES DRAWN AEROBIC AND ANAEROBIC 4 MLS  Final   Culture  Setup Time   Final    11/08/2014 03:31 Performed at [facility]    Culture   Final           BLOOD CULTURE RECEIVED NO GROWTH TO DATE CULTURE WILL BE HELD FOR 5 DAYS BEFORE ISSUING A FINAL NEGATIVE REPORT [facility]formed at [facility]    Report Status PENDING  Incomplete     Scheduled Meds: . ALPRAZolam  0.5 mg Oral BID  . aspirin  81 mg Oral q morning - 10a  . donepezil  5 mg Oral QHS  . feeding supplement (ENSURE COMPLETE)  237  mL Oral BID BM  . ipratropium-albuterol  3 mL Nebulization QID  . levofloxacin (LEVAQUIN) IV  500 mg Intravenous Q48H  . metoprolol tartrate  25 mg Oral BID  . mirtazapine  7.5 mg Oral QHS  . nitroGLYCERIN  0.2 mg Transdermal Q24H  . pantoprazole  40 mg Oral QAC breakfast  . pravastatin  10 mg Oral QHS  . tiZANidine  4 mg Oral BID  . vancomycin  500 mg Intravenous Q48H   Continuous Infusions: . sodium chloride 75 mL/hr at 11/08/14 2150

## 2014-11-09 NOTE — ED Notes (Signed)
Urine culture: >100,000 colonies E. Coli. Pt. adequately treated with Cipro.  [facility]. Vassie MoselleYork, Hiyab Nhem M 11/09/2014

## 2014-11-09 NOTE — Progress Notes (Addendum)
Clinical Social Work Department CLINICAL SOCIAL WORK PLACEMENT NOTE 11/09/2014  Patient:  [name],[name]  Account Number:  [id number] Admit date:  11/07/2014  Clinical Social Worker:  Garlan FairSUZANNA Jeanene Mena, LCSWA  Date/time:  11/09/2014 03:00 PM  Clinical Social Work is seeking post-discharge placement for this patient at the following level of care:   SKILLED NURSING   (*CSW will update this form in Epic as items are completed)   11/09/2014  Patient/family provided with [facility] of Clinical Social Work's list of facilities offering this level of care within the geographic area requested by the patient (or if unable, by the patient's family).  11/09/2014  Patient/family informed of their freedom to choose among providers that offer the needed level of care, that participate in Medicare, Medicaid or managed care program needed by the patient, have an available bed and are willing to accept the patient.  11/09/2014  Patient/family informed of [facility]' ownership interest in [facility], as well as of the fact that they are under no obligation to receive care at this facility.  PASARR submitted to EDS on 11/09/2014 PASARR number received on 11/09/2014  FL2 transmitted to all facilities in geographic area requested by pt/family on  11/09/2014 FL2 transmitted to all facilities within larger geographic area on   Patient informed that his/her managed care company has contracts with or will negotiate with  certain facilities, including the following:     Patient/family informed of bed offers received:  11/10/2014 [facility] Physician recommends and patient chooses bed at    [facility] on 11/12/2014 Patient to be transferred to facility by ambulance Sharin Mons(PTAR) Patient and family notified of transfer on 11/12/2014 Name of family member notified:  Pt notified at bedside, pt daughter,  Chip BoerVicki notified via telephone  The following physician request were entered in Epic:   Additional Comments:   Loletta SpecterSuzanna Demetrice Amstutz, MSW, LCSW Clinical Social Work [phone]

## 2014-11-09 NOTE — Evaluation (Signed)
Physical Therapy Evaluation Patient Details Name: [name] MRN: [id number] DOB: 07/19/1922 Today's Date: 11/09/2014   History of Present Illness  80 y.o. female with past medical history of COPD, dyslipidemia, GERD, CAD (on aspirin, plavix, aggrenox), dementia, hypertension who [facility] with shortness of breath and fevers for past 24 hours. In addition, patient appears to be more confused than previously. She is being treated with broad spectrum antibiotics for pneumonia and UTI.  Clinical Impression  **Pt admitted with HCAP, UTI, COPD exacerbation, CHF*. Pt currently with functional limitations due to the deficits listed below (see PT Problem List).  Pt will benefit from skilled PT to increase their independence and safety with mobility to allow discharge to the venue listed below.   Mod assist for supine to sit, min assist to pivot to recliner with RW. Per RN pt becomes dyspnic with activity, so activity was limited to transferring to recliner. SaO2 90% on 3L O2 with activity.   *    Follow Up Recommendations SNF    Equipment Recommendations  None recommended by PT    Recommendations for Other Services       Precautions / Restrictions Precautions Precautions: Fall Precaution Comments: monitor O2 Restrictions Weight Bearing Restrictions: No      Mobility  Bed Mobility Overal bed mobility: Needs Assistance Bed Mobility: Supine to Sit     Supine to sit: Mod assist     General bed mobility comments: assist to initiate movement and to advance BLEs/raise trunk  Transfers Overall transfer level: Needs assistance Equipment used: Rolling walker (2 wheeled) Transfers: Stand Pivot Transfers;Sit to/from Stand Sit to Stand: Min assist Stand pivot transfers: Min assist       General transfer comment: min A to rise, min A to guide pt to recliner, manual/verbal cues to keep RW close and to reach back for armrest prior to sitting in  recliner  Ambulation/Gait                Stairs            Wheelchair Mobility    Modified Rankin (Stroke Patients Only)       Balance Overall balance assessment: Needs assistance Sitting-balance support: No upper extremity supported;Feet supported Sitting balance-Leahy Scale: Good     Standing balance support: Bilateral upper extremity supported Standing balance-Leahy Scale: Poor                               Pertinent Vitals/Pain Pain Assessment: No/denies pain    Home Living Family/patient expects to be discharged to:: Assisted living               Home Equipment: Walker - 2 wheels;Walker - 4 wheels      Prior Function Level of Independence: Needs assistance   Gait / Transfers Assistance Needed: pt stated she walked independently with RW  ADL's / Homemaking Assistance Needed: assist for cooking/cleaning, pt stated she was mod I with bathing/dressing        Hand Dominance   Dominant Hand: Right    Extremity/Trunk Assessment   Upper Extremity Assessment: Generalized weakness           Lower Extremity Assessment: Generalized weakness      Cervical / Trunk Assessment: Kyphotic  Communication   Communication: HOH  Cognition Arousal/Alertness: Awake/alert Behavior During Therapy: WFL for tasks assessed/performed Overall Cognitive Status: Within Functional Limits for tasks assessed  General Comments      Exercises        Assessment/Plan    PT Assessment Patient needs continued PT services  PT Diagnosis Difficulty walking;Generalized weakness   PT Problem List Decreased activity tolerance;Decreased mobility;Decreased knowledge of use of DME;Decreased balance;Cardiopulmonary status limiting activity  PT Treatment Interventions Gait training;Functional mobility training;Therapeutic activities;Patient/family education;Therapeutic exercise;DME instruction   PT Goals (Current goals can be  found in the Care Plan section) Acute Rehab PT Goals Patient Stated Goal: return to ALF PT Goal Formulation: With patient Time For Goal Achievement: 11/23/14 Potential to Achieve Goals: Good    Frequency Min 3X/week   Barriers to discharge        Co-evaluation               End of Session Equipment Utilized During Treatment: Gait belt Activity Tolerance: Patient limited by fatigue Patient left: in chair;with call bell/phone within reach Nurse Communication: Mobility status         Time: 1225-1249 PT Time Calculation (min) (ACUTE ONLY): 24 min   Charges:   PT Evaluation $Initial PT Evaluation Tier I: 1 Procedure PT Treatments $Therapeutic Activity: 8-22 mins   PT G Codes:          Tamala SerUhlenberg, Rankin Coolman Kistler 11/09/2014, 1:02 PM [phone]

## 2014-11-09 NOTE — Plan of Care (Signed)
Problem: Phase I Progression Outcomes Goal: Pain controlled with appropriate interventions Outcome: Completed/Met Date Met:  11/09/14 Goal: OOB as tolerated unless otherwise ordered Outcome: Completed/Met Date Met:  11/09/14 Goal: Confirm chest x-ray completed Outcome: Completed/Met Date Met:  11/09/14 Goal: Code status addressed with pt/family Outcome: Completed/Met Date Met:  11/09/14 Goal: Initial discharge plan identified Outcome: Completed/Met Date Met:  11/09/14 Goal: Voiding-avoid urinary catheter unless indicated Outcome: Completed/Met Date Met:  11/09/14 Goal: Hemodynamically stable Outcome: Progressing

## 2014-11-09 NOTE — Progress Notes (Signed)
Called to patient bedside for complaints of dyspnea. Patient experiencing SOB with tachypnea. Audible wheezing noted. BBS extremely diminished upon auscultation. BD administered via nebulizer, with positive outcome. Patient agrees she feels much better, as she looks much more comfortable at this time as well. RT assessment and treatment protocol completed. Orders changed accordingly.

## 2014-11-09 NOTE — Evaluation (Signed)
Occupational Therapy Evaluation Patient Details Name: Allison BlitzHelen Smallman MRN: [id number] DOB: 06/27/1922 Today's Date: 11/09/2014    History of Present Illness 80 y.o. female with past medical history of COPD, dyslipidemia, GERD, CAD (on aspirin, plavix, aggrenox), dementia, hypertension who [facility] with shortness of breath and fevers for past 24 hours. In addition, patient appears to be more confused than previously. She is being treated with broad spectrum antibiotics for pneumonia and UTI   Clinical Impression   [facility] and reports feeling fatigued overall. Daughter states she didn't sleep well last night. She did well with helping to manage depends on standing. Daughter present for session. Will follow to progress ADL independence.     Follow Up Recommendations  SNF;Supervision/Assistance - 24 hour    Equipment Recommendations  None recommended by OT    Recommendations for Other Services       Precautions / Restrictions Precautions Precautions: Fall Precaution Comments: monitor O2 Restrictions Weight Bearing Restrictions: No      Mobility Bed Mobility              Transfers Overall transfer level: Needs assistance Equipment used: None Transfers: Sit to/from Stand Sit to Stand: Min assist Stand pivot transfers: Min assist       General transfer comment: min assist to rise and steady as well as control descent. verbal cues hand placement.    Balance                                      ADL Overall ADL's : Needs assistance/impaired Eating/Feeding: Independent;Sitting   Grooming: Wash/dry hands;Set up;Sitting   Upper Body Bathing: Set up;Sitting   Lower Body Bathing: Minimal assistance;Sit to/from stand   Upper Body Dressing : Minimal assistance;Sitting   Lower Body Dressing: Sit to/from stand;Minimal assistance   Toilet Transfer: Minimal assistance;Stand-pivot;BSC   Toileting- Clothing Manipulation  and Hygiene: Minimal assistance;Sit to/from stand         General ADL Comments: Pts O2 sats on 3L at start of task 96% and after 95%. Pt fatigued and daughter reports pt didnt sleep well last night. Pt did well with standing to pull up and down Depends with min assist.      Vision                     Perception     Praxis      Pertinent Vitals/Pain Pain Assessment: No/denies pain     Hand Dominance Right   Extremity/Trunk Assessment Upper Extremity Assessment Upper Extremity Assessment: Generalized weakness   Lower Extremity Assessment Lower Extremity Assessment: Generalized weakness   Cervical / Trunk Assessment Cervical / Trunk Assessment: Kyphotic   Communication Communication Communication: HOH   Cognition Arousal/Alertness: Awake/alert Behavior During Therapy: WFL for tasks assessed/performed Overall Cognitive Status: Within Functional Limits for tasks assessed                     General Comments       Exercises       Shoulder Instructions      Home Living Family/patient expects to be discharged to:: Skilled nursing facility                             Home Equipment: Dan HumphreysWalker - 2 wheels;Walker - 4 wheels  Prior Functioning/Environment Level of Independence: Needs assistance  Gait / Transfers Assistance Needed: pt stated she walked independently with RW ADL's / Homemaking Assistance Needed: assist for cooking/cleaning, pt stated she was mod I with bathing/dressing        OT Diagnosis: Generalized weakness   OT Problem List: Decreased strength;Decreased knowledge of use of DME or AE   OT Treatment/Interventions: Self-care/ADL training;DME and/or AE instruction;Therapeutic activities    OT Goals(Current goals can be found in the care plan section) Acute Rehab OT Goals Patient Stated Goal: pt agreeable up with OT. OT Goal Formulation: With patient Time For Goal Achievement: 11/23/14 Potential to Achieve  Goals: Good  OT Frequency: Min 2X/week   Barriers to D/C:            Co-evaluation              End of Session Equipment Utilized During Treatment: Oxygen  Activity Tolerance: Patient tolerated treatment well Patient left: in chair;with call bell/phone within reach;with family/visitor present   Time: 1345-1413 OT Time Calculation (min): 28 min Charges:  OT General Charges $OT Visit: 1 Procedure OT Evaluation $Initial OT Evaluation Tier I: 1 Procedure OT Treatments $Therapeutic Activity: 8-22 mins G-Codes:    Lennox LaityStone, Rogerio Boutelle Stafford  [phone] 11/09/2014, 2:23 PM

## 2014-11-09 NOTE — Progress Notes (Signed)
MD, Pt. Reports she is very sensitive to developing thrush on antibiotics.  Would like prophylactic medication for this please. Thanks, Kenton KingfisherMills, Jerry Haugen [place]

## 2014-11-09 NOTE — Plan of Care (Signed)
Problem: Phase II Progression Outcomes Goal: Pain controlled Outcome: Progressing Goal: Progress activity as tolerated unless otherwise ordered Outcome: Progressing Goal: Tolerating diet Outcome: Progressing Goal: If HF, initiate HF Core Reminder Form Outcome: Progressing

## 2014-11-09 NOTE — Progress Notes (Signed)
CSW continuing to follow for disposition planning.   CSW reviewed chart and noted that PT/OT recommending SNF at discharge for short term rehab before pt returns to [facility]F.   CSW met with pt and pt daughter, Jocelyn Lamer at bedside. CSW introduced self to pt daughter as CSW had not yet met pt daughter.  CSW discussed with pt and pt daughter that PT/OT evaluation recommended rehab at [facility] prior to returning to ALF. Pt and pt daughter agreeable and feel that pt will benefit from short term rehab. Pt daughter discussed that pt has been at [facility] and [facility] in the past and pt husband is a long term resident at [facility] therefore pt wants to consider [facility] as an option. CSW expressed understanding and discussed that CSW will initiate SNF search to [facility] and [place] in order to determine if facilities are able to offer a bed.  CSW updated pt FL2 and initiated SNF search to [facility] and [facility].   CSW to follow up with pt and pt daughter regarding bed offers.  CSW to continue to follow to provide support and assist with pt discharge planning needs.   [name], MSW, [place] [facility] [phone]

## 2014-11-10 DIAGNOSIS — E43 Unspecified severe protein-calorie malnutrition: Secondary | ICD-10-CM

## 2014-11-10 LAB — CBC
HEMATOCRIT: 31.7 % — AB (ref 36.0–46.0)
HEMOGLOBIN: 9.9 g/dL — AB (ref 12.0–15.0)
MCH: 29.5 pg (ref 26.0–34.0)
MCHC: 31.2 g/dL (ref 30.0–36.0)
MCV: 94.3 fL (ref 78.0–100.0)
Platelets: 138 10*3/uL — ABNORMAL LOW (ref 150–400)
RBC: 3.36 MIL/uL — AB (ref 3.87–5.11)
RDW: 13.6 % (ref 11.5–15.5)
WBC: 4.7 10*3/uL (ref 4.0–10.5)

## 2014-11-10 NOTE — Care Management Note (Signed)
CARE MANAGEMENT NOTE 11/10/2014  Patient:  [name],[name]   Account Number:  [id number]  Date Initiated:  11/10/2014  Documentation initiated by:  Sandford CrazeLEMENTS,Raeanna Soberanes  Subjective/Objective Assessment:   80 yo female admitted with HCAP. Hx of CHF, COPD and HTN.     Action/Plan:   [facility].   Anticipated DC Date:  11/11/2014   Anticipated DC Plan:  SKILLED NURSING FACILITY  In-house referral  Clinical Social Worker      DC Planning Services  CM consult      Choice offered to / List presented to:             Status of service:  In process, will continue to follow Medicare Important Message given?   (If response is "NO", the following Medicare IM given date fields will be blank) Date Medicare IM given:   Medicare IM given by:   Date Additional Medicare IM given:   Additional Medicare IM given by:    Discharge Disposition:    Per UR Regulation:  Reviewed for med. necessity/level of care/duration of stay  If discussed at Long Length of Stay Meetings, dates discussed:    Comments:  11/10/14 Sandford Crazeora Shawntel Farnworth RN,BSN,NCM [phone] Chart reviewed and CM following for DC needs.  Pt has a PCP and is [facility].  Pt will be going to SNF at DC.  Pt has not had a hospital admission since 12/10/12 per Epic.  CM will follow and assist with DC as needed.

## 2014-11-10 NOTE — Progress Notes (Signed)
CSW continuing to follow.   CSW followed up with pt at bedside to notify pt that [facility] both [facility] to [facility] pt a bed.  CSW discussed with pt that MD does not feel that pt is ready for discharge yet, but it will still be important for pt to determine what facility she would like to go to for rehab. Pt shared that she would like to speak with pt daughter regarding decision. Pt agreeable to CSW contacting pt daughter to notify of bed offers.  CSW contacted pt daughter, Chip BoerVicki via telephone present in pt room. CSW discussed with pt daughter the bed offers [facility] Pt daughter will discuss with pt and notify this CSW of decision for SNF. CSW provided pt daughter with CSW contact information.   CSW to await decision from pt and pt daughter [facility].   CSW to continue to follow to provide support and assist with pt discharge [facility] when medically stable.  Loletta SpecterSuzanna Corina Stacy, MSW, LCSW Clinical Social Work [phone]

## 2014-11-10 NOTE — Plan of Care (Signed)
Problem: Phase I Progression Outcomes Goal: OOB as tolerated unless otherwise ordered Outcome: Progressing [facility]OB when [facility] the [facility] with one assist.

## 2014-11-10 NOTE — Progress Notes (Signed)
Patient ID: [name], female   DOB: 10/30/1922, 80 y.o.   MRN: [id number] [facility] PROGRESS NOTE  [name] AOZ:[id number] DOB: 10/21/1922 DOA: 11/07/2014 PCP: Florentina JennyRIPP, HENRY, MD  Brief narrative: 80 y.o. female with past medical history of COPD, dyslipidemia, GERD, CAD (on aspirin, plavix, aggrenox), dementia, hypertension who [facility] with shortness of breath and fevers for past 24 hours. In addition, patient appears to be more confused than previously. She is being treated with broad spectrum antibiotics for pneumonia and UTI.    Assessment/Plan:     Principal Problem: Acute respiratory failure with hypoxia / Healthcare associated pneumonia / COPD exacerbation - Patient is from nursing home and at high risk of healthcare associated pneumonia. Patient was hypoxic on the admission, oxygen saturation 93% on nasal cannula oxygen support. Hypoxia likely secondary to HCAP. - Patient was started on broad-spectrum antibiotics, Levaquin and vancomycin. - strep pneumonia is negative, legionella urine antigen is negative, blood cultures show no growth to date. - Continue duoneb every 4 hours as needed. Active Problems: Chronic kidney disease, stage IV - Creatinine in 2014 was 1.7 and creatinine on this admission around patient's baseline renal function. - Continue to monitor BMP UTI (lower urinary tract infection) secondary to E.Coli - continue current IV abx - urine culture pan sensitive E.Coli; continue current antibiotics  Chronic combined systolic and diastolic heart failure - continue current medications, metoprolol 25 mg by mouth twice a day, statin therapy. - Continue aspirin but Aggrenox and Plavix on hold due to risk of bleeding. Please note hemoglobin drop noted since admission, 12.9 down to 9.8. In addition, platelet count dropped from 172 down to 137. Acute metabolic encephalopathy - likely secondary to combination of urinary tract infection,  pneumonia. No significant changes in mental status since admission. - continue Aricept - appreciate PT evaluation  Anemia of chronic disease - Secondary to history of chronic kidney disease - Hemoglobin dropped from 12.9 down to 9.8. Stopped Plavix and Aggrenox - hemoglobin is stable at 9.9. No current indications for transfusion Thrombocytopenia - Platelet count dropped from 172 down to 137 since admission. Stopped Plavix and Aggrenox.  - no bleeding noted. Hypokalemia - Patient is on Lasix at home. This was put on hold because of hypotension.  - potassium supplemented Hyponatremia - Likely secondary to dehydration. Continue IV fluids. Essential hypertension - Continue metoprolol for now. Started Norvasc and hydralazine. Will continue to hold lasix due to renal insufficiency.  Dyslipidemia - Continue Pravachol Severe protein calorie malnutrition - Nutrition consulted.  DVT Prophylaxis  - SCD"s bilaterally and aspirin   Code Status: DNR/DNI  Family Communication: Family not at the bedside this morning. Updated over the phone 11/10/2014  Disposition Plan: to ALF once stable   IV access:   Peripheral IV  Procedures and diagnostic studies:   Dg Chest 2 View (if Patient Has Fever And/or Copd) 11/07/2014 RIGHT lower lobe pneumonia. Followup in 4-6 weeks to ensure radiographic clearing and exclude an underlying lesion is recommended.   Medical Consultants:   None  Other Consultants:   Physical therapy   Nutrition  IAnti-Infectives:    Levaquin 11/07/2014 -->  Vanco 11/07/2014 -->   Manson PasseyEVINE, ALMA, MD  [facility] Pager [phone]  If 7PM-7AM, please contact night-coverage [email or web address] Password [email or web address] 11/10/2014, 10:20 AM   LOS: 3 days    HPI/Subjective: No acute overnight events.  Objective: Filed Vitals:   11/10/14 0555 11/10/14 0858 11/10/14 0906 11/10/14 0936  BP: 186/88   153/73  Pulse: 58   98  Temp: 99.6 F (37.6  C)     TempSrc: Oral     Resp: 16     Height:      Weight:      SpO2: 93% 88% 92%     Intake/Output Summary (Last 24 hours) at 11/10/14 1020 Last data filed at 11/10/14 0850  Gross per 24 hour  Intake 2969.75 ml  Output   1650 ml  Net 1319.75 ml    Exam:   General:  Pt is not in distress  Cardiovascular: Regular rate and rhythm, S1/S2 appreciated   Respiratory: bilateral air entry, no wheezing   Abdomen: Soft, non tender, non distended, bowel sounds present   Data Reviewed: Basic Metabolic Panel:  Recent Labs Lab 11/07/14 1834 11/07/14 1849 11/08/14 0425  NA 133* 131* 133*  K 3.7 3.6* 3.5*  CL 93* 94* 97  CO2 26  --  24  GLUCOSE 145* 146* 102*  BUN 32* 33* 29*  CREATININE 1.77* 1.90* 1.53*  CALCIUM 9.1  --  8.4   Liver Function Tests: No results for input(s): AST, ALT, ALKPHOS, BILITOT, PROT, ALBUMIN in the last 168 hours. No results for input(s): LIPASE, AMYLASE in the last 168 hours. No results for input(s): AMMONIA in the last 168 hours. CBC:  Recent Labs Lab 11/07/14 1834 11/07/14 1849 11/08/14 0425 11/10/14 0445  WBC 8.9  --  6.3 4.7  HGB 11.3* 12.9 9.8* 9.9*  HCT 34.3* 38.0 29.6* 31.7*  MCV 91.5  --  92.8 94.3  PLT 172  --  137* 138*   Cardiac Enzymes: No results for input(s): CKTOTAL, CKMB, CKMBINDEX, TROPONINI in the last 168 hours. BNP: Invalid input(s): POCBNP CBG: No results for input(s): GLUCAP in the last 168 hours.  Urine culture     Status: None   Collection Time: 11/06/14  3:24 PM  Result Value Ref Range Status   Specimen Description URINE, CLEAN CATCH  Final   Special Requests NONE  Final   Culture  Setup Time   Final   Colony Count   Final   Culture   Final    ESCHERICHIA COLI   Report Status 11/08/2014 FINAL  Final   Organism ID, Bacteria ESCHERICHIA COLI  Final      Susceptibility   Escherichia coli - MIC*    AMPICILLIN <=2 SENSITIVE Sensitive     CEFAZOLIN <=4 SENSITIVE Sensitive     CEFTRIAXONE <=1 SENSITIVE  Sensitive     CIPROFLOXACIN <=0.25 SENSITIVE Sensitive     GENTAMICIN <=1 SENSITIVE Sensitive     LEVOFLOXACIN <=0.12 SENSITIVE Sensitive     NITROFURANTOIN <=16 SENSITIVE Sensitive     TOBRAMYCIN <=1 SENSITIVE Sensitive     TRIMETH/SULFA <=20 SENSITIVE Sensitive     PIP/TAZO <=4 SENSITIVE Sensitive     * ESCHERICHIA COLI  Blood culture (routine x 2)     Status: None (Preliminary result)   Collection Time: 11/07/14  6:35 PM  Result Value Ref Range Status   Specimen Description BLOOD BLOOD LEFT FOREARM  Final   Culture  Setup Time   Final   Culture   Final           BLOOD CULTURE RECEIVED NO GROWTH TO DATE    Report Status PENDING  Incomplete  Blood culture (routine x 2)     Status: None (Preliminary result)   Collection Time: 11/07/14  6:47 PM  Result Value Ref Range Status   Specimen Description BLOOD L HAND  Final   Culture  Setup Time   Final   Culture   Final           BLOOD CULTURE RECEIVED NO GROWTH TO DATE    Report Status PENDING  Incomplete     Scheduled Meds: . ALPRAZolam  0.5 mg Oral BID  . amLODipine  5 mg Oral Daily  . aspirin  81 mg Oral q morning - 10a  . donepezil  5 mg Oral QHS  . feeding supplement (ENSURE COMPLETE)  237 mL Oral BID BM  . hydrALAZINE  10 mg Oral 3 times per day  . ipratropium-albuterol  3 mL Nebulization QID  . levofloxacin (LEVAQUIN) IV  500 mg Intravenous Q48H  . metoprolol tartrate  25 mg Oral BID  . mirtazapine  7.5 mg Oral QHS  . nitroGLYCERIN  0.2 mg Transdermal Q24H  . pantoprazole  40 mg Oral QAC breakfast  . pravastatin  10 mg Oral QHS  . tiZANidine  4 mg Oral BID  . vancomycin  500 mg Intravenous Q48H   Continuous Infusions: . sodium chloride 75 mL/hr at 11/10/14 0631

## 2014-11-10 NOTE — Progress Notes (Signed)
ANTIBIOTIC CONSULT NOTE - Follow Up  Pharmacy Consult for Vancomycin and Levaquin Indication: pneumonia, urosepsis  Allergies  Allergen Reactions  . Altace [Ramipril] Other (See Comments)    Reaction unknown  . Ambien [Zolpidem Tartrate] Other (See Comments)    Reaction unknown  . Codeine Other (See Comments)    Reaction unknown  . Demerol [Meperidine] Other (See Comments)    Reaction unknown  . Penicillins Other (See Comments)    Reaction unknown  . Sulfa Antibiotics Other (See Comments)    Reaction unknown  . Versed [Midazolam] Other (See Comments)    Reaction unknown    Patient Measurements: Height: 5' (152.4 cm) Weight: 87 lb 6.4 oz (39.644 kg) IBW/kg (Calculated) : 45.5 Height: 60 inches Weight: 39.5 kg  Vital Signs: Temp: 99.6 F (37.6 C) (11/18 0555) Temp Source: Oral (11/18 0555) BP: 186/88 mmHg (11/18 0555) Pulse Rate: 58 (11/18 0555) Intake/Output from previous day: 11/17 0701 - 11/18 0700 In: 2849.8 [P.O.:840; I.V.:1809.8; IV Piggyback:200] Out: 1850 [Urine:1850] Intake/Output from this shift: Total I/O In: 240 [P.O.:240] Out: -   Labs:  Recent Labs  11/07/14 1834 11/07/14 1849 11/08/14 0425 11/10/14 0445  WBC 8.9  --  6.3 4.7  HGB 11.3* 12.9 9.8* 9.9*  PLT 172  --  137* 138*  CREATININE 1.77* 1.90* 1.53*  --    Estimated Creatinine Clearance: 15.3 mL/min (by C-G formula based on Cr of 1.53). No results for input(s): VANCOTROUGH, VANCOPEAK, VANCORANDOM, GENTTROUGH, GENTPEAK, GENTRANDOM, TOBRATROUGH, TOBRAPEAK, TOBRARND, AMIKACINPEAK, AMIKACINTROU, AMIKACIN in the last 72 hours.   Microbiology: Recent Results (from the past 720 hour(s))  Urine culture     Status: None   Collection Time: 11/06/14  3:24 PM  Result Value Ref Range Status   Specimen Description URINE, CLEAN CATCH  Final   Special Requests NONE  Final   Culture  Setup Time   Final    11/06/2014 20:50 Performed at [facility] Count   Final    >=100,000  COLONIES/ML Performed at [facility]    Culture   Final    ESCHERICHIA COLI Performed at [facility]    Report Status 11/08/2014 FINAL  Final   Organism ID, Bacteria ESCHERICHIA COLI  Final      Susceptibility   Escherichia coli - MIC*    AMPICILLIN <=2 SENSITIVE Sensitive     CEFAZOLIN <=4 SENSITIVE Sensitive     CEFTRIAXONE <=1 SENSITIVE Sensitive     CIPROFLOXACIN <=0.25 SENSITIVE Sensitive     GENTAMICIN <=1 SENSITIVE Sensitive     LEVOFLOXACIN <=0.12 SENSITIVE Sensitive     NITROFURANTOIN <=16 SENSITIVE Sensitive     TOBRAMYCIN <=1 SENSITIVE Sensitive     TRIMETH/SULFA <=20 SENSITIVE Sensitive     PIP/TAZO <=4 SENSITIVE Sensitive     * ESCHERICHIA COLI  Blood culture (routine x 2)     Status: None (Preliminary result)   Collection Time: 11/07/14  6:35 PM  Result Value Ref Range Status   Specimen Description BLOOD BLOOD LEFT FOREARM  Final   Special Requests BOTTLES DRAWN AEROBIC AND ANAEROBIC 5 CC EACH  Final   Culture  Setup Time   Final    11/08/2014 03:31 Performed at [facility]    Culture   Final           BLOOD CULTURE RECEIVED NO GROWTH TO DATE CULTURE WILL BE HELD FOR 5 DAYS BEFORE ISSUING A FINAL NEGATIVE REPORT Performed at [facility]  Report Status PENDING  Incomplete  Blood culture (routine x 2)     Status: None (Preliminary result)   Collection Time: 11/07/14  6:47 PM  Result Value Ref Range Status   Specimen Description BLOOD L HAND  Final   Special Requests BOTTLES DRAWN AEROBIC AND ANAEROBIC 4 MLS  Final   Culture  Setup Time   Final    11/08/2014 03:31 Performed at [facility]    Culture   Final           BLOOD CULTURE RECEIVED NO GROWTH TO DATE CULTURE WILL BE HELD FOR 5 DAYS BEFORE ISSUING A FINAL NEGATIVE REPORT Performed at [facility]    Report Status PENDING  Incomplete    Medical History: Past Medical History  Diagnosis Date  . Hypertension   . Peripheral arterial disease   .  Cognitive disorder   . Esophageal stricture     prior dilatation in June 2013  . Osteoarthritis   . Anxiety   . UTI (lower urinary tract infection) June 2013  . COPD (chronic obstructive pulmonary disease)     acute respiratory failure November 2013 with PNA  . NSTEMI (non-ST elevated myocardial infarction) November 2013    felt to be due to demand ischemia; managed conservatively; not a candidate for invasive testing due to general frailty  . IBS (irritable bowel syndrome)   . Acute combined systolic and diastolic HF (heart failure)     EF is 40 to 45% per echo November 2013  . Anemia     uncertain etiology  . Dementia   . Anxiety   . Sacral decubitus ulcer   . Necrotic pneumonia     Medications:  Scheduled:  . ALPRAZolam  0.5 mg Oral BID  . amLODipine  5 mg Oral Daily  . aspirin  81 mg Oral q morning - 10a  . donepezil  5 mg Oral QHS  . feeding supplement (ENSURE COMPLETE)  237 mL Oral BID BM  . hydrALAZINE  10 mg Oral 3 times per day  . ipratropium-albuterol  3 mL Nebulization QID  . levofloxacin (LEVAQUIN) IV  500 mg Intravenous Q48H  . metoprolol tartrate  25 mg Oral BID  . mirtazapine  7.5 mg Oral QHS  . nitroGLYCERIN  0.2 mg Transdermal Q24H  . pantoprazole  40 mg Oral QAC breakfast  . pravastatin  10 mg Oral QHS  . tiZANidine  4 mg Oral BID  . vancomycin  500 mg Intravenous Q48H   Infusions:  . sodium chloride 75 mL/hr at 11/10/14 0631   PRN:   Assessment: 89 yof from ALF brought to ED with fever, SHOB and lower back pain.  Went to Urgent Care yesterday and started on cipro for presumed UTI. Code Sepsis called and Pharmacy asked to dose vancomycin and levaquin for urosepsis and RLL PNA.  11/14 >> Cipro >> 11/15 11/15 >> Vancomycin >> 11/15 >> Levaquin >>  Tmax: afebrile WBC: 4.7, improved Renal: SCr 1.53 on 11/16, improved (previous values ~1.5), CrCl 27 ml/min (N), 15 ml/min (CG)  11/14 urine: >100 GNR (go to chart review tab to see) - pansensitive  Ecoli 11/15 blood: ngtd  Goal of Therapy:  Vancomycin troughs 15-20 mcg/ml Levaquin dose per renal function Eradication of infection  Plan:   Continue vancomycin 500mg  IV q48h (received dose 11/17, next dose due 11/19) Check trough at steady state and will check a random vanc and SCr tomorrow AM Continue Levaquin 500mg  IV q48h Follow up renal  function & cultures, clinical course   Hessie KnowsJustin M Adrianah Prophete, PharmD, BCPS Pager [phone] 11/10/2014 9:15 AM

## 2014-11-11 LAB — CBC
HEMATOCRIT: 32 % — AB (ref 36.0–46.0)
HEMOGLOBIN: 10.1 g/dL — AB (ref 12.0–15.0)
MCH: 29.6 pg (ref 26.0–34.0)
MCHC: 31.6 g/dL (ref 30.0–36.0)
MCV: 93.8 fL (ref 78.0–100.0)
Platelets: 171 10*3/uL (ref 150–400)
RBC: 3.41 MIL/uL — ABNORMAL LOW (ref 3.87–5.11)
RDW: 13.5 % (ref 11.5–15.5)
WBC: 5.8 10*3/uL (ref 4.0–10.5)

## 2014-11-11 LAB — BASIC METABOLIC PANEL
Anion gap: 9 (ref 5–15)
BUN: 11 mg/dL (ref 6–23)
CO2: 28 meq/L (ref 19–32)
CREATININE: 0.9 mg/dL (ref 0.50–1.10)
Calcium: 8.9 mg/dL (ref 8.4–10.5)
Chloride: 104 mEq/L (ref 96–112)
GFR calc Af Amer: 63 mL/min — ABNORMAL LOW (ref >90)
GFR calc non Af Amer: 55 mL/min — ABNORMAL LOW (ref >90)
GLUCOSE: 118 mg/dL — AB (ref 70–99)
Potassium: 4.1 mEq/L (ref 3.7–5.3)
Sodium: 141 mEq/L (ref 137–147)

## 2014-11-11 LAB — VANCOMYCIN, RANDOM: Vancomycin Rm: <5 ug/mL

## 2014-11-11 MED ORDER — CLOPIDOGREL BISULFATE 75 MG PO TABS
75.0000 mg | ORAL_TABLET | Freq: Every day | ORAL | Status: DC
  Administered 2014-11-11 – 2014-11-12 (×2): 75 mg via ORAL
  Filled 2014-11-11 (×2): qty 1

## 2014-11-11 MED ORDER — VANCOMYCIN HCL IN DEXTROSE 1-5 GM/200ML-% IV SOLN
1000.0000 mg | Freq: Once | INTRAVENOUS | Status: DC
  Administered 2014-11-11: 1000 mg via INTRAVENOUS
  Filled 2014-11-11: qty 200

## 2014-11-11 MED ORDER — SODIUM CHLORIDE 0.9 % IV SOLN: 500.0000 mg | INTRAVENOUS | Status: DC

## 2014-11-11 NOTE — Progress Notes (Signed)
ANTIBIOTIC CONSULT NOTE - Follow Up  Pharmacy Consult for Vancomycin and Levaquin Indication: pneumonia, urosepsis  Allergies  Allergen Reactions  . Altace [Ramipril] Other (See Comments)    Reaction unknown  . Ambien [Zolpidem Tartrate] Other (See Comments)    Reaction unknown  . Codeine Other (See Comments)    Reaction unknown  . Demerol [Meperidine] Other (See Comments)    Reaction unknown  . Penicillins Other (See Comments)    Reaction unknown  . Sulfa Antibiotics Other (See Comments)    Reaction unknown  . Versed [Midazolam] Other (See Comments)    Reaction unknown    Patient Measurements: Height: 5' (152.4 cm) Weight: 87 lb 6.4 oz (39.644 kg) IBW/kg (Calculated) : 45.5 Height: 60 inches Weight: 39.5 kg  Assessment: 89 yof from ALF brought to ED with fever, SHOB and lower back pain.  Went to Urgent Care yesterday and started on cipro for presumed UTI. Code Sepsis called and Pharmacy asked to dose vancomycin and levaquin for urosepsis and RLL PNA.  Antiinfectives  11/14 >> Cipro >> 11/15 11/15 >> Vancomycin >> 11/19 11/15 >> Levaquin >>  Labs / vitals Tmax: afebrile WBC: WNL, stable Renal: SCr improved to 0.9 (previous values ~1.5), CrCl 42 (N), 25 (CG)  Microbiology 11/14 urine: >100 E.coli (pansensitive) 11/15 blood x2: ngtd 11/15 S pneumo Ur Ag: negative 11/15 Legionella Ur Ag: negative  Levels / Dose changes: 11/19 0500 Random Vanc = <5.0 (last vanc dose 11/17 at 2100), give 1g x1, incr to 500mg  q24h  11/19: D5 Levaquin 750mg  x1, then 500mg  q48h for urosepsis/HCAP. S/p "5" days vanc. VT this AM undetectable, dose increased with new 1g load, vancomycin now to be d/c'd after this dose. To continue on IV LVQ for now per MD request  Goal of Therapy:  Levaquin dose per renal function Eradication of infection  Plan:   Discontinue vancomycin after patient receives current 1g dose started at 0800 Continue Levaquin 500mg  IV q48h Follow up renal function &  cultures, clinical course  Thank you for the consult.  Ross LudwigJesse [place] Akers, PharmD, BCPS Pager: [phone] Pharmacy: [phone] 11/11/2014 9:29 AM

## 2014-11-11 NOTE — Progress Notes (Signed)
CSW continuing to follow for disposition planning.   CSW followed up with pt at bedside re: decision for SNF.   Pt discussed that she and pt daughter [facility]. Pt agreeable to CSW contacting pt daughter to confirm.  CSW contacted pt daughter, Chip BoerVicki via telephone. CSW provided support to pt daughter as she discussed that it has been difficult to make a decision about rehab at SNF, but pt daughter feels that [facility] as [facility]   [facility] and left message with admission coordinator to notify facility of pt acceptance of bed offer.  Per MD, pt not yet medically ready for discharge. Likely D/C tomorrow.  CSW to continue to follow to provide support and assist with pt discharge needs [facility] when pt medically ready.  Loletta SpecterSuzanna Dashauna Heymann, MSW, LCSW Clinical Social Work [phone]

## 2014-11-11 NOTE — Progress Notes (Signed)
Patient ID: [name], female   DOB: October 02, 1922, 80 y.o.   MRN: [id number] [facility] PROGRESS NOTE  Jasmyne Lodato PPI:[id number] DOB: June 02, 1922 DOA: 11/07/2014 PCP: [name], MD  Brief narrative:    80 y.o. female with past medical history of COPD, dyslipidemia, GERD, CAD (on aspirin, plavix, aggrenox), dementia, hypertension who presented to [facility] ED 11/07/2014 with shortness of breath and fevers for past 24 hours. In addition, patient appears to be more confused than previously. She is being treated with broad spectrum antibiotics for pneumonia and UTI.    Assessment/Plan:    Principal Problem: Acute respiratory failure with hypoxia / Healthcare associated pneumonia / COPD exacerbation - Patient is from nursing home and at high risk of healthcare associated pneumonia. Patient was hypoxic on the admission, oxygen saturation 93% on nasal cannula oxygen support. Hypoxia likely secondary to HCAP. - Patient was started on broad-spectrum antibiotics, Levaquin and vancomycin. Since blood cultures to date are negative we will stop the vancomycin and we will continue Levaquin. - Strep pneumonia is negative, legionella urine antigen is negative - Continue duoneb every 4 hours as needed. Active Problems: Chronic kidney disease, stage IV - Creatinine in 2014 was 1.7 and creatinine on this admission around patient's baseline renal function. - Creatinine has normalized with additional 5 a fluids. UTI (lower urinary tract infection) secondary to E.Coli - Urine culture pan sensitive E.Coli; continue current antibiotic Levaquin  Chronic combined systolic and diastolic heart failure - Continue current medications, metoprolol 25 mg by mouth twice a day, statin therapy. - Continue aspirin but Aggrenox and Plavix were put on hold due to risk of bleeding (of note hemoglobin drop noted on admission, 12.9 down to 9.8. In addition, platelet count dropped from 172 down to 137). Since hemoglobin  stabilized to 10.1 and platelet count is within normal limits we will restart Plavix today. Acute metabolic encephalopathy - Likely secondary to combination of urinary tract infection, pneumonia. No significant changes in mental status since admission. - Continue Aricept - Per physical therapy evaluation, recommendation is for skilled nursing facility. Social work Conservation officer, historic buildings. Anemia of chronic disease - Secondary to history of chronic kidney disease - Hemoglobin dropped from 12.9 down to 9.8. Stopped Plavix and Aggrenox - hemoglobin is stable at 9.9. No current indications for transfusion Thrombocytopenia - Platelet count down to 138 but now normalized. Patient is on multiple antiplatelet agents, aspirin, Plavix, Aggrenox. On admission we held Aggrenox and Plavix but continued aspirin. Since platelet count is normalized we will add Plavix today. Hypokalemia - Patient was on Lasix at home. This was put on hold because of hypotension and renal insufficiency.  - Potassium supplemented Hyponatremia - Likely secondary to dehydration, lasix.  Essential hypertension - Continue metoprolol for now. Started Norvasc and hydralazine. We held Lasix because of renal insufficiency. - Blood pressure this morning 145/70. Dyslipidemia - Continue Pravachol Severe protein calorie malnutrition - Nutrition consulted.  DVT Prophylaxis  - SCD"s, aspirin, plavix.  Code Status: DNR/DNI  Family Communication: Family not at the bedside this morning. Updated over the phone 11/10/2014  Disposition Plan: to SNF once stable     IV access:   Peripheral IV  Procedures and diagnostic studies:    Dg Chest 2 View (if Patient Has Fever And/or Copd) 11/07/2014 RIGHT lower lobe pneumonia. Followup in 4-6 weeks to ensure radiographic clearing and exclude an underlying lesion is recommended.   Medical Consultants:   None   Other Consultants:   Physical therapy   Nutrition  IAnti-Infectives:    Levaquin 11/07/2014 -->   Vanco 11/07/2014 --> 11/192015    Manson PasseyEVINE, Henley Blyth, MD  [facility] Pager [phone]  If 7PM-7AM, please contact night-coverage [email or web address] Password [email or web address] 11/11/2014, 9:28 AM   LOS: 4 days    HPI/Subjective: No acute overnight events.  Objective: Filed Vitals:   11/10/14 2028 11/10/14 2121 11/11/14 0606 11/11/14 0805  BP:  169/62 145/70   Pulse:  71 77   Temp:  97.9 F (36.6 C) 98.2 F (36.8 C)   TempSrc:  Oral Oral   Resp:  16 16   Height:      Weight:      SpO2: 90% 91% 93% 94%    Intake/Output Summary (Last 24 hours) at 11/11/14 0928 Last data filed at 11/11/14 0647  Gross per 24 hour  Intake 1772.5 ml  Output    800 ml  Net  972.5 ml    Exam:   General:  Pt is not in acute distress, sleeping but has opened her eyes when called her name, disoriented   Cardiovascular: Regular rate and rhythm, S1/S2 appreciated   Respiratory: no wheezing, no crackles, no rhonchi  Abdomen: Soft, non tender, non distended, bowel sounds present  Extremities: No edema, pulses palpable bilaterally   Data Reviewed: Basic Metabolic Panel:  Recent Labs Lab 11/07/14 1834 11/07/14 1849 11/08/14 0425 11/11/14 0425  NA 133* 131* 133* 141  K 3.7 3.6* 3.5* 4.1  CL 93* 94* 97 104  CO2 26  --  24 28  GLUCOSE 145* 146* 102* 118*  BUN 32* 33* 29* 11  CREATININE 1.77* 1.90* 1.53* 0.90  CALCIUM 9.1  --  8.4 8.9   Liver Function Tests: No results for input(s): AST, ALT, ALKPHOS, BILITOT, PROT, ALBUMIN in the last 168 hours. No results for input(s): LIPASE, AMYLASE in the last 168 hours. No results for input(s): AMMONIA in the last 168 hours. CBC:  Recent Labs Lab 11/07/14 1834 11/07/14 1849 11/08/14 0425 11/10/14 0445 11/11/14 0425  WBC 8.9  --  6.3 4.7 5.8  HGB 11.3* 12.9 9.8* 9.9* 10.1*  HCT 34.3* 38.0 29.6* 31.7* 32.0*  MCV 91.5  --  92.8 94.3 93.8  PLT 172  --  137* 138* 171   Cardiac Enzymes: No  results for input(s): CKTOTAL, CKMB, CKMBINDEX, TROPONINI in the last 168 hours. BNP: Invalid input(s): POCBNP CBG: No results for input(s): GLUCAP in the last 168 hours.  Urine culture     Status: None   Collection Time: 11/06/14  3:24 PM  Result Value Ref Range Status   Specimen Description URINE, CLEAN CATCH  Final   Special Requests NONE  Final   Culture  Setup Time   Final   Colony Count   Final   Culture   Final    ESCHERICHIA COLI Performed at [facility]    Report Status 11/08/2014 FINAL  Final   Organism ID, Bacteria ESCHERICHIA COLI  Final      Susceptibility   Escherichia coli - MIC*    AMPICILLIN <=2 SENSITIVE Sensitive     CEFAZOLIN <=4 SENSITIVE Sensitive     CEFTRIAXONE <=1 SENSITIVE Sensitive     CIPROFLOXACIN <=0.25 SENSITIVE Sensitive     GENTAMICIN <=1 SENSITIVE Sensitive     LEVOFLOXACIN <=0.12 SENSITIVE Sensitive     NITROFURANTOIN <=16 SENSITIVE Sensitive     TOBRAMYCIN <=1 SENSITIVE Sensitive     TRIMETH/SULFA <=20 SENSITIVE Sensitive     PIP/TAZO <=4 SENSITIVE Sensitive     *  ESCHERICHIA COLI  Blood culture (routine x 2)     Status: None (Preliminary result)   Collection Time: 11/07/14  6:35 PM  Result Value Ref Range Status   Specimen Description BLOOD BLOOD LEFT FOREARM  Final   Special Requests BOTTLES DRAWN AEROBIC AND ANAEROBIC 5 CC EACH  Final   Culture  Setup Time   Final   Culture   Final           BLOOD CULTURE RECEIVED NO GROWTH TO DATE   Report Status PENDING  Incomplete  Blood culture (routine x 2)     Status: None (Preliminary result)   Collection Time: 11/07/14  6:47 PM  Result Value Ref Range Status   Specimen Description BLOOD L HAND  Final   Special Requests BOTTLES DRAWN AEROBIC AND ANAEROBIC 4 MLS  Final   Culture  Setup Time   Final   Culture   Final           BLOOD CULTURE RECEIVED NO GROWTH TO DATE   Report Status PENDING  Incomplete     Scheduled Meds: . ALPRAZolam  0.5 mg Oral BID  . amLODipine  5 mg Oral  Daily  . aspirin  81 mg Oral q morning - 10a  . donepezil  5 mg Oral QHS  . feeding supplement (ENSURE COMPLETE)  237 mL Oral BID BM  . hydrALAZINE  10 mg Oral 3 times per day  . ipratropium-albuterol  3 mL Nebulization QID  . levofloxacin (LEVAQUIN) IV  500 mg Intravenous Q48H  . metoprolol tartrate  25 mg Oral BID  . mirtazapine  7.5 mg Oral QHS  . nitroGLYCERIN  0.2 mg Transdermal Q24H  . pantoprazole  40 mg Oral QAC breakfast  . pravastatin  10 mg Oral QHS  . tiZANidine  4 mg Oral BID   Continuous Infusions: . sodium chloride 75 mL/hr at 11/11/14 [phone]

## 2014-11-11 NOTE — Care Management Note (Signed)
CARE MANAGEMENT NOTE 11/11/2014  Patient:  [name],[name]   Account Number:  [id number]  Date Initiated:  11/10/2014  Documentation initiated by:  Sandford CrazeLEMENTS,Isay Perleberg  Subjective/Objective Assessment:   80 yo female admitted with HCAP. Hx of CHF, COPD and HTN.     Action/Plan:   [facility]   Anticipated DC Date:  11/11/2014   Anticipated DC Plan:  SKILLED NURSING FACILITY  In-house referral  Clinical Social Worker      DC Planning Services  CM consult      Choice offered to / List presented to:             Status of service:  In process, will continue to follow Medicare Important Message given?  YES (If response is "NO", the following Medicare IM given date fields will be blank) Date Medicare IM given:  11/11/2014 Medicare IM given by:  Sandford CrazeLEMENTS,Marvia Troost Date Additional Medicare IM given:   Additional Medicare IM given by:    Discharge Disposition:    Per UR Regulation:  Reviewed for med. necessity/level of care/duration of stay  If discussed at Long Length of Stay Meetings, dates discussed:    Comments:  11/10/14 Sandford Crazeora Keyden Pavlov RN,BSN,NCM [phone] Chart reviewed and CM following for DC needs.  Pt has a PCP and is followed by [facility] pul[facility]ology.  Pt will be going to SNF at DC.  Pt has not had a hospital admission since 12/10/12 per Epic.  CM will follow and assist with DC as needed.

## 2014-11-12 ENCOUNTER — Encounter (HOSPITAL_COMMUNITY): Payer: Self-pay | Admitting: Internal Medicine

## 2014-11-12 LAB — BASIC METABOLIC PANEL
Anion gap: 10 (ref 5–15)
BUN: 11 mg/dL (ref 6–23)
CALCIUM: 9.6 mg/dL (ref 8.4–10.5)
CO2: 31 meq/L (ref 19–32)
Chloride: 105 mEq/L (ref 96–112)
Creatinine, Ser: 0.88 mg/dL (ref 0.50–1.10)
GFR calc Af Amer: 65 mL/min — ABNORMAL LOW (ref >90)
GFR calc non Af Amer: 56 mL/min — ABNORMAL LOW (ref >90)
GLUCOSE: 103 mg/dL — AB (ref 70–99)
Potassium: 3.8 mEq/L (ref 3.7–5.3)
Sodium: 146 mEq/L (ref 137–147)

## 2014-11-12 LAB — CBC
HEMATOCRIT: 34.9 % — AB (ref 36.0–46.0)
HEMOGLOBIN: 10.9 g/dL — AB (ref 12.0–15.0)
MCH: 29.5 pg (ref 26.0–34.0)
MCHC: 31.2 g/dL (ref 30.0–36.0)
MCV: 94.3 fL (ref 78.0–100.0)
Platelets: 222 10*3/uL (ref 150–400)
RBC: 3.7 MIL/uL — ABNORMAL LOW (ref 3.87–5.11)
RDW: 13.4 % (ref 11.5–15.5)
WBC: 5.6 10*3/uL (ref 4.0–10.5)

## 2014-11-12 MED ORDER — GUAIFENESIN-DM 100-10 MG/5ML PO SYRP
5.0000 mL | ORAL_SOLUTION | ORAL | Status: DC | PRN
  Administered 2014-11-12: 5 mL via ORAL
  Filled 2014-11-12: qty 10

## 2014-11-12 MED ORDER — LEVOFLOXACIN 750 MG PO TABS: 750.0000 mg | ORAL_TABLET | Freq: Every day | ORAL | Status: DC

## 2014-11-12 MED ORDER — ACETAMINOPHEN 325 MG PO TABS: 650.0000 mg | ORAL_TABLET | Freq: Four times a day (QID) | ORAL | Status: AC | PRN

## 2014-11-12 MED ORDER — ENSURE COMPLETE PO LIQD: 237.0000 mL | Freq: Two times a day (BID) | ORAL | Status: DC

## 2014-11-12 MED ORDER — DEXTROMETHORPHAN POLISTIREX 30 MG/5ML PO LQCR: 15.0000 mg | Freq: Every evening | ORAL | Status: DC | PRN

## 2014-11-12 MED ORDER — HYDROCODONE-ACETAMINOPHEN 5-325 MG PO TABS: 1.0000 | ORAL_TABLET | Freq: Four times a day (QID) | ORAL | Status: DC | PRN

## 2014-11-12 MED ORDER — ALPRAZOLAM 0.5 MG PO TABS: 0.5000 mg | ORAL_TABLET | Freq: Two times a day (BID) | ORAL | Status: DC

## 2014-11-12 MED ORDER — AMLODIPINE BESYLATE 5 MG PO TABS: 5.0000 mg | ORAL_TABLET | Freq: Every day | ORAL | Status: DC

## 2014-11-12 NOTE — Discharge Summary (Signed)
Physician Discharge Summary  Kandace BlitzHelen Bonny ZOX:[id number] DOB: 07/12/1922 DOA: 11/07/2014  PCP: Florentina JennyRIPP, HENRY, MD  Admit date: 11/07/2014 Discharge date: 11/12/2014   Recommendations for Outpatient Follow-Up:   1. The patient is being d/c'd to a SNF for further rehabilitation. 2. Patient should continue on supplemental oxygen, 2 L/minute chronically. 3. Follow-up chest x-ray recommended in 4-6 weeks to ensure radiographic clearing and to exclude an underlying lesion in her lungs. 4. Follow up final blood culture report.   Discharge Diagnosis:   Principal Problem:    HCAP (healthcare-associated pneumonia) Active Problems:    AKI (acute kidney injury)    Chronic respiratory failure with hypoxia    COPD with emphysema    Chronic combined systolic and diastolic heart failure    UTI (lower urinary tract infection)    Protein-calorie malnutrition, severe    Dementia    GERD    Coronary artery disease    Dyslipidemia    Hypertension    Stage III chronic kidney disease    Escherichia coli UTI    Acute metabolic encephalopathy    Anemia of chronic disease    Thrombocytopenia    Hypokalemia    Hyponatremia   Discharge Condition: Improved.  Diet recommendation: Low sodium, heart healthy.    History of Present Illness:   Mrs. [name] is an 80 year old woman with a PMH of COPD, chronic respiratory failure on supplemental home oxygen therapy, dyslipidemia, GERD, and CAD managed with aspirin/Plavix/Aggrenox as well as dementia and hypertension who was admitted on 11/07/14 with healthcare associated pneumonia as well as a UTI.   Hospital Course by Problem:    Principal Problem: Acute respiratory failure with hypoxia / Healthcare associated pneumonia / COPD exacerbation  Initially treated with Levaquin and vancomycin.  Blood cultures negative, vancomycin discontinued 11/11/14.  Continue Levaquin for 2 more days for a total treatment course of 7  days.  Strep pneumonia/legionella urine antigens negative.  Continue supplemental oxygen and DuoNeb's every 4 hours as needed.  Active Problems: Acute kidney injury/stage III Chronic kidney disease  Patient's creatinine has improved to 0.88 at discharge. She has underlying stage III chronic kidney disease.  UTI (lower urinary tract infection) secondary to E.Coli  Urine culture positive for pan sensitive E.Coli; Levaquin sensitive.  Chronic combined systolic and diastolic heart failure  Stable on usual home medications.  Acute metabolic encephalopathy in the setting of dementia  Likely secondary to combination of urinary tract infection, pneumonia.   Continue Aricept  Mental status clear.  Anemia of chronic disease  Hemoglobin stable at discharge.  Thrombocytopenia  Platelet count WNL at discharge.  Hypokalemia  Supplemented.  Hyponatremia  Likely secondary to dehydration, lasix. Normal discharge.  Essential hypertension  Discharge on metoprolol, Norvasc and resume Lasix.  Dyslipidemia  Continue Pravachol.  Severe protein calorie malnutrition  Continue ensure supplements.    Medical Consultants:    None.   Discharge Exam:   Filed Vitals:   11/12/14 0507  BP: 134/54  Pulse: 84  Temp: 99 F (37.2 C)  Resp: 18   Filed Vitals:   11/11/14 2127 11/11/14 2139 11/12/14 0507 11/12/14 0830  BP:  140/72 134/54   Pulse: 91 92 84   Temp:  98.4 F (36.9 C) 99 F (37.2 C)   TempSrc:  Oral Oral   Resp: 16 16 18    Height:      Weight:      SpO2: 93%  94% 97%    Gen:  NAD Cardiovascular:  RRR, No M/R/G  Respiratory: Lungs CTAB Gastrointestinal: Abdomen soft, NT/ND with normal active bowel sounds. Extremities: No C/E/C   The results of significant diagnostics from this hospitalization (including imaging, microbiology, ancillary and laboratory) are listed below for reference.     Procedures and Diagnostic Studies:   Dg Chest 2 View (if  Patient Has Fever And/or Copd) 11/07/2014 RIGHT lower lobe pneumonia. Followup in 4-6 weeks to ensure radiographic clearing and exclude an underlying lesion is recommended.   Labs:   Basic Metabolic Panel:  Recent Labs Lab 11/07/14 1834 11/07/14 1849 11/08/14 0425 11/11/14 0425 11/12/14 0447  NA 133* 131* 133* 141 146  K 3.7 3.6* 3.5* 4.1 3.8  CL 93* 94* 97 104 105  CO2 26  --  24 28 31   GLUCOSE 145* 146* 102* 118* 103*  BUN 32* 33* 29* 11 11  CREATININE 1.77* 1.90* 1.53* 0.90 0.88  CALCIUM 9.1  --  8.4 8.9 9.6   GFR Estimated Creatinine Clearance: 26.6 mL/min (by C-G formula based on Cr of 0.88).  CBC:  Recent Labs Lab 11/07/14 1834 11/07/14 1849 11/08/14 0425 11/10/14 0445 11/11/14 0425 11/12/14 0447  WBC 8.9  --  6.3 4.7 5.8 5.6  HGB 11.3* 12.9 9.8* 9.9* 10.1* 10.9*  HCT 34.3* 38.0 29.6* 31.7* 32.0* 34.9*  MCV 91.5  --  92.8 94.3 93.8 94.3  PLT 172  --  137* 138* 171 222   Microbiology Recent Results (from the past 240 hour(s))  Urine culture     Status: None   Collection Time: 11/06/14  3:24 PM  Result Value Ref Range Status   Specimen Description URINE, CLEAN CATCH  Final   Special Requests NONE  Final   Culture  Setup Time   Final    11/06/2014 20:50 Performed at [facility] Count   Final    >=100,000 COLONIES/ML Performed at [facility]    Culture   Final    ESCHERICHIA COLI Performed at [facility]    Report Status 11/08/2014 FINAL  Final   Organism ID, Bacteria ESCHERICHIA COLI  Final      Susceptibility   Escherichia coli - MIC*    AMPICILLIN <=2 SENSITIVE Sensitive     CEFAZOLIN <=4 SENSITIVE Sensitive     CEFTRIAXONE <=1 SENSITIVE Sensitive     CIPROFLOXACIN <=0.25 SENSITIVE Sensitive     GENTAMICIN <=1 SENSITIVE Sensitive     LEVOFLOXACIN <=0.12 SENSITIVE Sensitive     NITROFURANTOIN <=16 SENSITIVE Sensitive     TOBRAMYCIN <=1 SENSITIVE Sensitive     TRIMETH/SULFA <=20 SENSITIVE Sensitive      PIP/TAZO <=4 SENSITIVE Sensitive     * ESCHERICHIA COLI  Blood culture (routine x 2)     Status: None (Preliminary result)   Collection Time: 11/07/14  6:35 PM  Result Value Ref Range Status   Specimen Description BLOOD BLOOD LEFT FOREARM  Final   Special Requests BOTTLES DRAWN AEROBIC AND ANAEROBIC 5 CC EACH  Final   Culture  Setup Time   Final    11/08/2014 03:31 Performed at [facility]    Culture   Final           BLOOD CULTURE RECEIVED NO GROWTH TO DATE CULTURE WILL BE HELD FOR 5 DAYS BEFORE ISSUING A FINAL NEGATIVE REPORT Performed at [facility]    Report Status PENDING  Incomplete  Blood culture (routine x 2)     Status: None (Preliminary result)   Collection Time: 11/07/14  6:47 PM  Result Value Ref Range Status   Specimen Description BLOOD L HAND  Final   Special Requests BOTTLES DRAWN AEROBIC AND ANAEROBIC 4 MLS  Final   Culture  Setup Time   Final    11/08/2014 03:31 Performed at [facility]    Culture   Final           BLOOD CULTURE RECEIVED NO GROWTH TO DATE CULTURE WILL BE HELD FOR 5 DAYS BEFORE ISSUING A FINAL NEGATIVE REPORT Performed at [facility]    Report Status PENDING  Incomplete     Discharge Instructions:   Discharge Instructions    Call MD for:  extreme fatigue    Complete by:  As directed      Call MD for:  persistant dizziness or light-headedness    Complete by:  As directed      Call MD for:  temperature >100.4    Complete by:  As directed      Diet - low sodium heart healthy    Complete by:  As directed      Discharge instructions    Complete by:  As directed   You were cared for by Dr. Hillery Aldohristina Taesha Goodell  (a hospitalist) during your hospital stay. If you have any questions about your discharge medications or the care you received while you were in the hospital after you are discharged, you can call the unit and ask to speak with the hospitalist on call if the hospitalist that took care of you is not available.  Once you are discharged, your primary care physician will handle any further medical issues. Please note that NO REFILLS for any discharge medications will be authorized once you are discharged, as it is imperative that you return to your primary care physician (or establish a relationship with a primary care physician if you do not have one) for your aftercare needs so that they can reassess your need for medications and monitor your lab values.  Any outstanding tests can be reviewed by your PCP at your follow up visit.  It is also important to review any medicine changes with your PCP.  Please bring these d/c instructions with you to your next visit so your physician can review these changes with you.  If you do not have a primary care physician, you can call [phone] for a physician referral.  It is highly recommended that you obtain a PCP for hospital follow up.     Increase activity slowly    Complete by:  As directed             Medication List    TAKE these medications        acetaminophen 325 MG tablet  Commonly known as:  TYLENOL  Take 2 tablets (650 mg total) by mouth every 6 (six) hours as needed for fever.     albuterol (2.5 MG/3ML) 0.083% nebulizer solution  Commonly known as:  PROVENTIL  Take 2.5 mg by nebulization 4 (four) times daily.     ALPRAZolam 0.5 MG tablet  Commonly known as:  XANAX  Take 1 tablet (0.5 mg total) by mouth 2 (two) times daily.     amLODipine 5 MG tablet  Commonly known as:  NORVASC  Take 1 tablet (5 mg total) by mouth daily.     aspirin 81 MG chewable tablet  Chew 81 mg by mouth every morning.     BENEFIBER DRINK MIX PO  Take 10 mLs by mouth  2 (two) times daily.     Carboxymethylcellul-Glycerin 0.5-0.9 % Soln  Apply 1 drop to eye 4 (four) times daily.     clopidogrel 75 MG tablet  Commonly known as:  PLAVIX  Take 75 mg by mouth every morning.     dextromethorphan 30 MG/5ML liquid  Commonly known as:  DELSYM  Take 2.5 mLs (15 mg total)  by mouth at bedtime as needed for cough.     dipyridamole-aspirin 200-25 MG per 12 hr capsule  Commonly known as:  AGGRENOX  Take 1 capsule by mouth 2 (two) times daily.     donepezil 5 MG tablet  Commonly known as:  ARICEPT  Take 5 mg by mouth at bedtime.     feeding supplement (ENSURE COMPLETE) Liqd  Take 237 mLs by mouth 2 (two) times daily between meals.     furosemide 20 MG tablet  Commonly known as:  LASIX  Take 20 mg by mouth See admin instructions. Take 1 tablet by mouth on Monday, Wednesday, Friday, and Saturday.     HYDROcodone-acetaminophen 5-325 MG per tablet  Commonly known as:  NORCO/VICODIN  Take 1 tablet by mouth every 6 (six) hours as needed for moderate pain.     levofloxacin 750 MG tablet  Commonly known as:  LEVAQUIN  Take 1 tablet (750 mg total) by mouth daily.     Melatonin 1 MG Tabs  Take 1 mg by mouth at bedtime.     metoprolol tartrate 25 MG tablet  Commonly known as:  LOPRESSOR  Take 25 mg by mouth 2 (two) times daily.     mirtazapine 7.5 MG tablet  Commonly known as:  REMERON  Take 7.5 mg by mouth at bedtime.     nitroGLYCERIN 0.2 mg/hr patch  Commonly known as:  NITRODUR - Dosed in mg/24 hr  Place 0.2 mg onto the skin daily.     nitroGLYCERIN 0.4 MG SL tablet  Commonly known as:  NITROSTAT  Place 1 tablet (0.4 mg total) under the tongue every 5 (five) minutes as needed for chest pain (for break through chest pain in addition to nitro patch).     pantoprazole 40 MG tablet  Commonly known as:  PROTONIX  Take 40 mg by mouth daily before breakfast.     pravastatin 10 MG tablet  Commonly known as:  PRAVACHOL  Take 10 mg by mouth at bedtime.     promethazine 12.5 MG tablet  Commonly known as:  PHENERGAN  Take 12.5 mg by mouth every 6 (six) hours as needed for nausea or vomiting.     senna 8.6 MG tablet  Commonly known as:  SENOKOT  Take 1 tablet by mouth as needed for constipation.     sodium chloride 0.65 % Soln nasal spray  Commonly  known as:  OCEAN  Place 2 sprays into both nostrils as needed for congestion.     tiZANidine 4 MG tablet  Commonly known as:  ZANAFLEX  Take 4 mg by mouth 2 (two) times daily.     traMADol 50 MG tablet  Commonly known as:  ULTRAM  Take 50 mg by mouth every 8 (eight) hours as needed for moderate pain.     Vitamin D3 400 UNITS Caps  Take 1 capsule by mouth daily.           Follow-up Information    Follow up with [name], MD. Schedule an appointment as soon as possible for a visit in 3 weeks.   Specialty:  Family Medicine   Why:  Hospital follow up.   Contact information:   [address] [place] [address] [phone]        Time coordinating discharge: 35 minutes.  Signed:  Keene Gilkey  Pager [phone] [facility] 11/12/2014, 10:56 AM

## 2014-11-12 NOTE — Discharge Instructions (Signed)

## 2014-11-12 NOTE — Progress Notes (Signed)
Physical Therapy Treatment Patient Details Name: Allison BlitzHelen Vaughn MRN: [id number] DOB: 07/15/1922 Today's Date: 11/12/2014    History of Present Illness 80 y.o. female with past medical history of COPD, dyslipidemia, GERD, CAD (on aspirin, plavix, aggrenox), dementia, hypertension who presented to [facility] ED 11/07/2014 with shortness of breath and fevers for past 24 hours. In addition, patient appears to be more confused than previously. She is being treated with broad spectrum antibiotics for pneumonia and UTI    PT Comments    *Pt progressing with mobility.  She walked 150' with RW without LOB, however SaO2 dropped to 78% on 3L and 80% on 4L O2 with ambulation.  After 2 minutes rest SaO2 88% on 3L. **  Follow Up Recommendations  SNF     Equipment Recommendations  None recommended by PT    Recommendations for Other Services OT consult     Precautions / Restrictions Precautions Precautions: Fall Precaution Comments: monitor O2 Restrictions Weight Bearing Restrictions: No    Mobility  Bed Mobility Overal bed mobility: Needs Assistance Bed Mobility: Supine to Sit     Supine to sit: Supervision        Transfers Overall transfer level: Needs assistance Equipment used: Rolling walker (2 wheeled) Transfers: Sit to/from Stand Sit to Stand: Min guard Stand pivot transfers: Min guard       General transfer comment: cues for hand placement  Ambulation/Gait Ambulation/Gait assistance: Supervision Ambulation Distance (Feet): 150 Feet Assistive device: Rolling walker (2 wheeled) Gait Pattern/deviations: Decreased stride length   Gait velocity interpretation: at or above normal speed for age/gender General Gait Details: steady with RW; SaO2 dropped to 78% on 3L O2, 80% on 4L O2, cues for pursed lip breathing, after 2 minutes rest SaO2 up to 88% on 3L O2   Stairs            Wheelchair Mobility    Modified Rankin (Stroke Patients Only)       Balance Overall balance  assessment: Needs assistance Sitting-balance support: Feet supported Sitting balance-Leahy Scale: Good     Standing balance support: Bilateral upper extremity supported Standing balance-Leahy Scale: Poor Standing balance comment: steady with RW with walking                    Cognition Arousal/Alertness: Awake/alert Behavior During Therapy: WFL for tasks assessed/performed Overall Cognitive Status: History of cognitive impairments - at baseline (Pt with h/o dementia. Pt able to follow directions and give history, some word finding difficulty x 1. Overall functions well. )                      Exercises      General Comments        Pertinent Vitals/Pain Pain Assessment: No/denies pain    Home Living                      Prior Function            PT Goals (current goals can now be found in the care plan section) Acute Rehab PT Goals Patient Stated Goal: to get stronger PT Goal Formulation: With patient Time For Goal Achievement: 11/23/14 Potential to Achieve Goals: Good Progress towards PT goals: Progressing toward goals    Frequency  Min 3X/week    PT Plan Current plan remains appropriate    Co-evaluation             End of Session Equipment Utilized During Treatment: Gait belt;Oxygen  Activity Tolerance: Patient limited by fatigue Patient left: in chair;with call bell/phone within reach;with chair alarm set     Time: 1050-1105 PT Time Calculation (min) (ACUTE ONLY): 15 min  Charges:  $Gait Training: 8-22 mins                    G Codes:      [name], [name] 11/12/2014, 11:25 AM [phone]

## 2014-11-12 NOTE — Progress Notes (Signed)
Pt for discharge [facility].  CSW facilitated pt discharge needs including contacting facility, faxing pt discharge information via LTC, discussing with pt at bedside and pt daughter, [name] via telephone, providing RN phone number to call report, and arranging ambulance transport via [facility] for [facility].  Pt eager to go to [facility] to get stronger and be near her husband who is a long term resident at facility.   No further social work needs identified at this time.  CSW signing off.   [name], MSW, LCSW Clinical Social Work [phone]

## 2014-11-12 NOTE — Progress Notes (Signed)
Occupational Therapy Treatment Patient Details Name: Allison BlitzHelen Pontillo MRN: [id number] DOB: 12/29/1921 Today's Date: 11/12/2014    History of present illness 80 y.o. female with past medical history of COPD, dyslipidemia, GERD, CAD (on aspirin, plavix, aggrenox), dementia, hypertension who [facility] ED [facility]/07/2014 with shortness of breath and fevers for past 24 hours. In addition, patient appears to be more confused than previously. She is being treated with broad spectrum antibiotics for pneumonia and UTI   OT comments  Pt progressing toward OT goals.  She is able to perform simple grooming standing at sink and functional mobility with min guard to min A.  DOE 2/4 on 3L 02  Follow Up Recommendations  SNF;Supervision/Assistance - 24 hour    Equipment Recommendations  None recommended by OT    Recommendations for Other Services      Precautions / Restrictions Precautions Precautions: Fall Precaution Comments: monitor O2       Mobility Bed Mobility Overal bed mobility: Needs Assistance Bed Mobility: Supine to Sit     Supine to sit: Supervision        Transfers Overall transfer level: Needs assistance Equipment used: Rolling walker (2 wheeled) Transfers: Sit to/from UGI CorporationStand;Stand Pivot Transfers Sit to Stand: Min guard Stand pivot transfers: Min guard            Balance Overall balance assessment: Needs assistance Sitting-balance support: Feet supported Sitting balance-Leahy Scale: Good     Standing balance support: During functional activity Standing balance-Leahy Scale: Poor                     ADL Overall ADL's : Needs assistance/impaired     Grooming: Oral care;Wash/dry face;Wash/dry hands;Minimal assistance;Standing Grooming Details (indicate cue type and reason): Pt with one LOB requiring min A to recover, when backing away from the sink                  Toilet Transfer: Min guard;Ambulation;BSC   Toileting- ArchitectClothing Manipulation and  Hygiene: Min guard       Functional mobility during ADLs: Minimal assistance;Min guard;Rolling walker General ADL Comments: Pt on 3L 02, DOE 2/4.  Pt very pleasant and talkative throughout session       Vision                     Perception     Praxis      Cognition   Behavior During Therapy: WFL for tasks assessed/performed Overall Cognitive Status: History of cognitive impairments - at baseline (Pt with h/o dementia)                       Extremity/Trunk Assessment               Exercises     Shoulder Instructions       General Comments      Pertinent Vitals/ Pain       Pain Assessment: No/denies pain  Home Living                                          Prior Functioning/Environment              Frequency Min 2X/week     Progress Toward Goals  OT Goals(current goals can now be found in the care plan section)     ADL Goals Pt Will Perform  Grooming: with supervision;standing Pt Will Perform Lower Body Bathing: with supervision;sit to/from stand Pt Will Perform Lower Body Dressing: with supervision;sit to/from stand Pt Will Transfer to Toilet: with supervision;ambulating;regular height toilet;grab bars Pt Will Perform Toileting - Clothing Manipulation and hygiene: with supervision  Plan Discharge plan remains appropriate    Co-evaluation                 End of Session Equipment Utilized During Treatment: Rolling walker;Oxygen   Activity Tolerance Patient tolerated treatment well   Patient Left in chair;with call bell/phone within reach;with chair alarm set   Nurse Communication Mobility status        Time: 1001-1026 OT Time Calculation (min): 25 min  Charges: OT General Charges $OT Visit: 1 Procedure OT Treatments $Self Care/Home Management : 23-37 mins  Shaan Rhoads M 11/12/2014, 10:41 AM

## 2014-11-13 IMAGING — CR DG ABDOMEN 1V
1 series · 1 of 1 positions shown · non-contrast
Comparison: 10/21/2012

CLINICAL DATA: Lower abdominal and lower back pain.  Constipation.

EXAM:
ABDOMEN - 1 VIEW

[t abdomen supine]
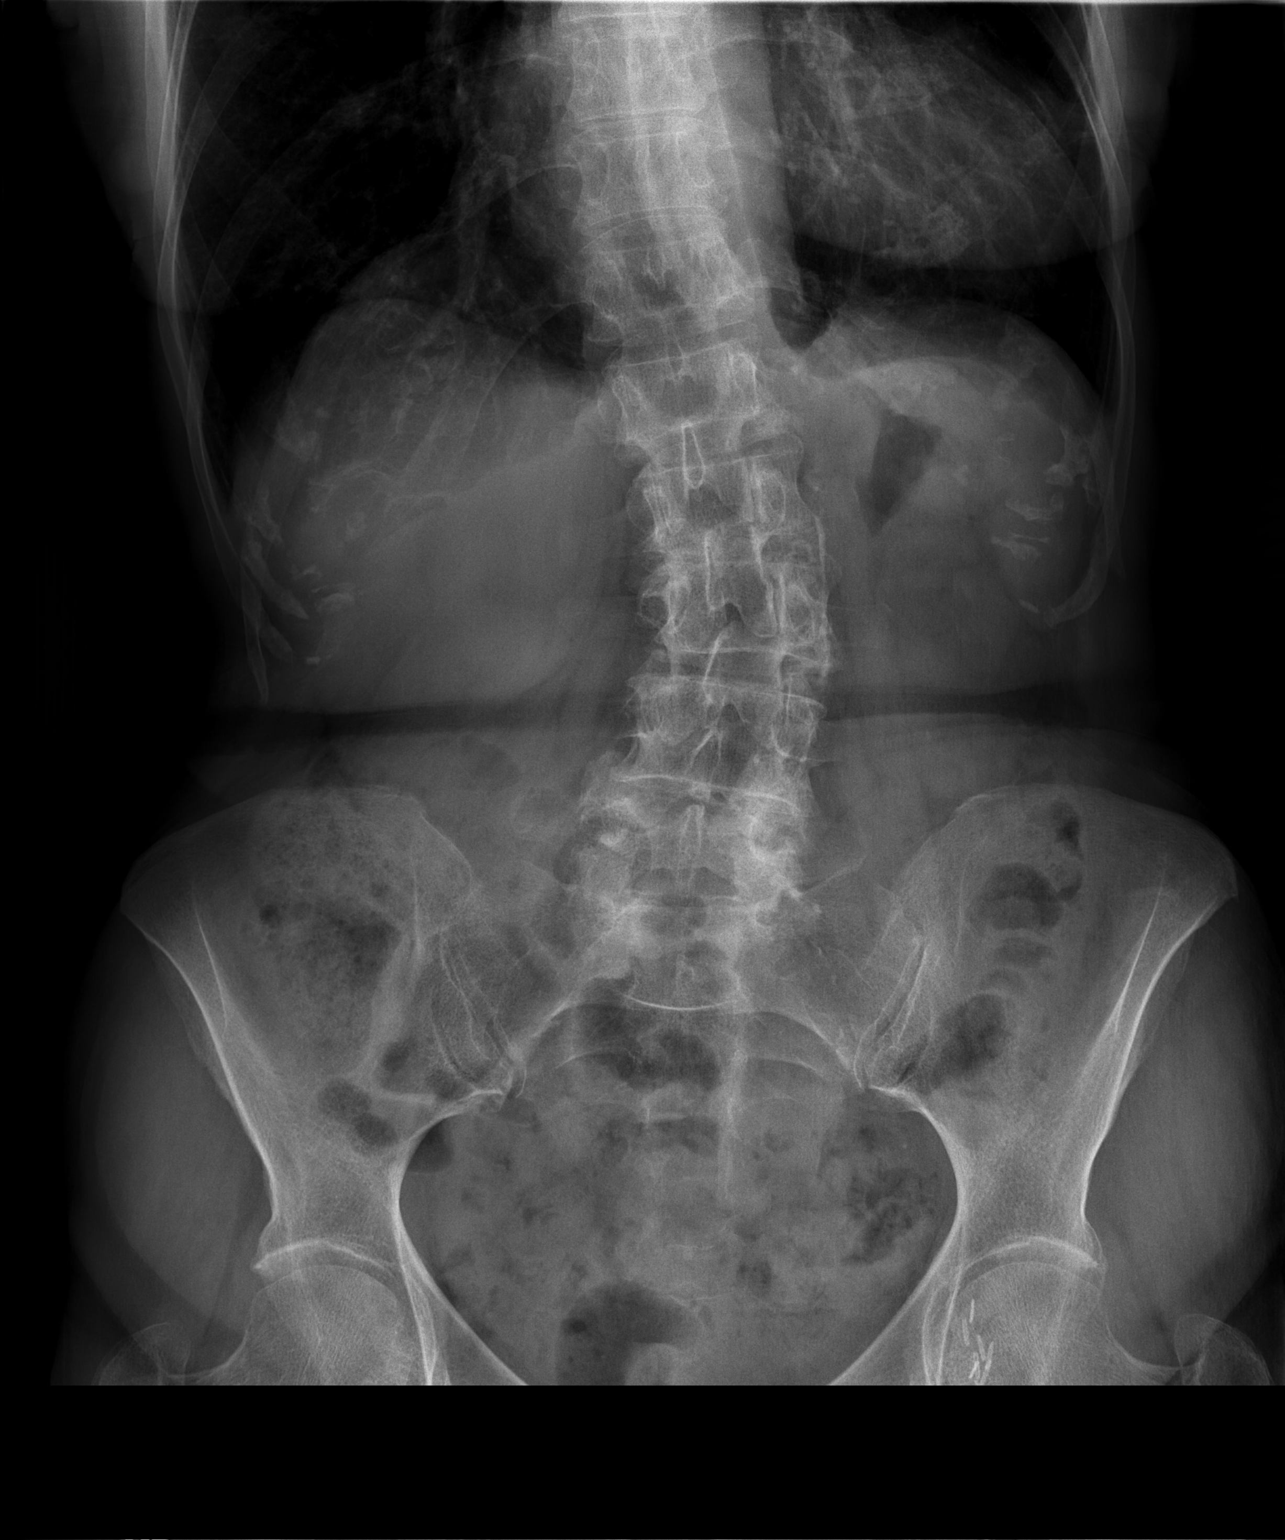

[1 of 1 positions shown; findings below may reference images not displayed]

FINDINGS: There is moderate stool within nondilated loops of colon.
Nonobstructive bowel gas pattern. No evidence for organomegaly. No
abnormal calcifications. There is convex left scoliosis of the
lumbar spine. Surgical clips are identified in the inguinal regions.
IMPRESSION: No evidence for acute  abnormality.

## 2014-11-14 LAB — CULTURE, BLOOD (ROUTINE X 2)
Culture: NO GROWTH
Culture: NO GROWTH

## 2015-03-18 IMAGING — CR DG THORACIC SPINE 2V
3 series · 3 of 3 positions shown · non-contrast
Comparison: Chest radiograph March 20, 2014

CLINICAL DATA: Back pain

EXAM:
THORACIC SPINE - 3 VIEW

[t thoracic spine ap]
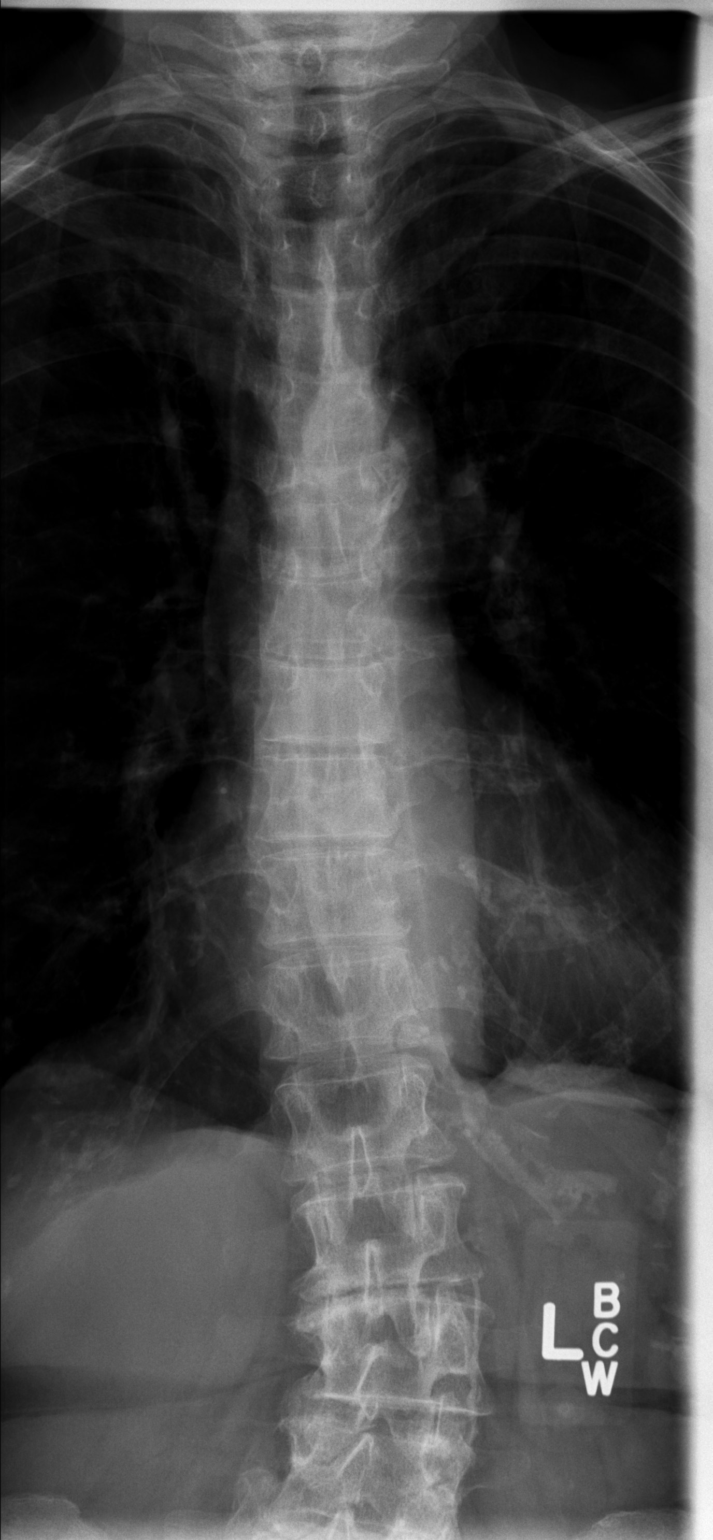

[t thoracic spine lat]
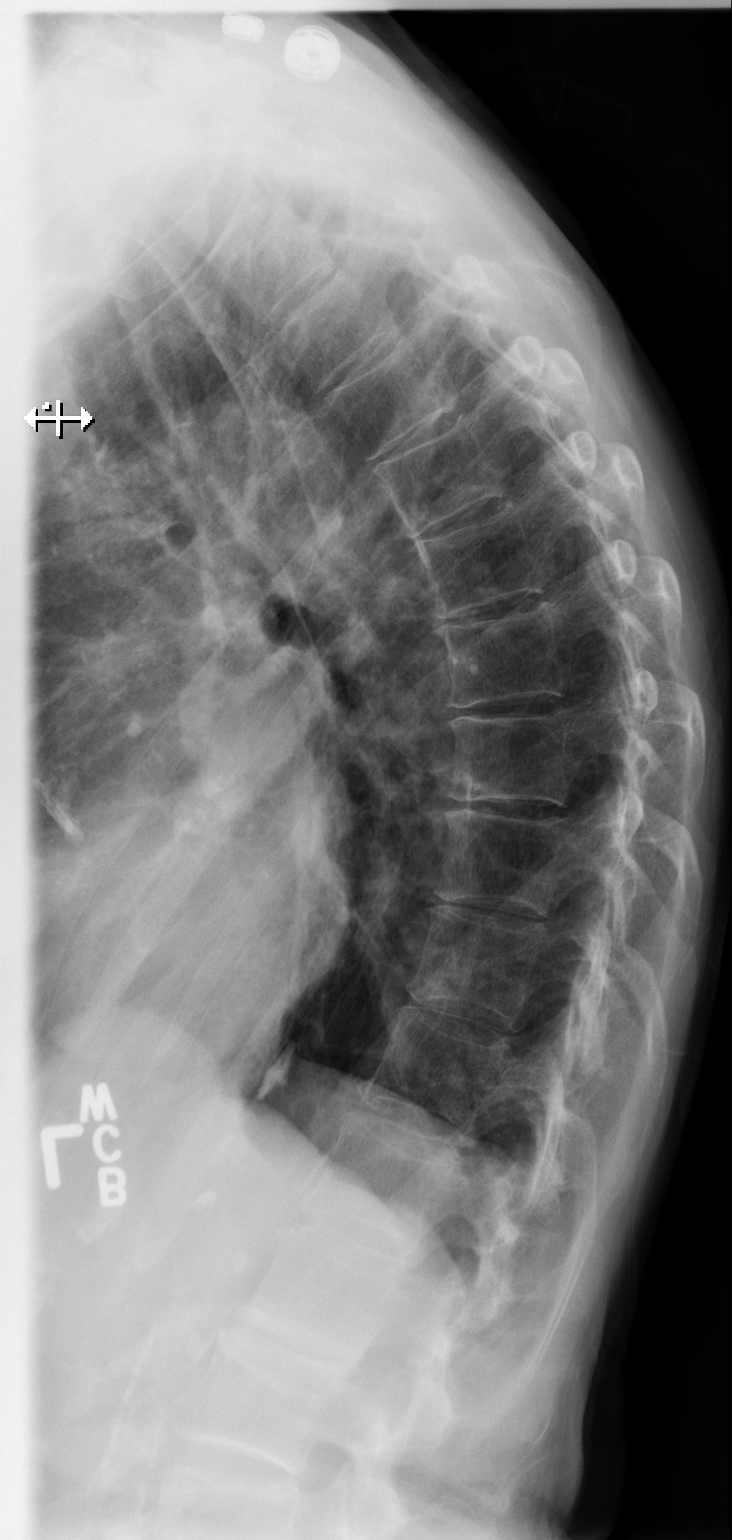

[t thoracic swimmers]
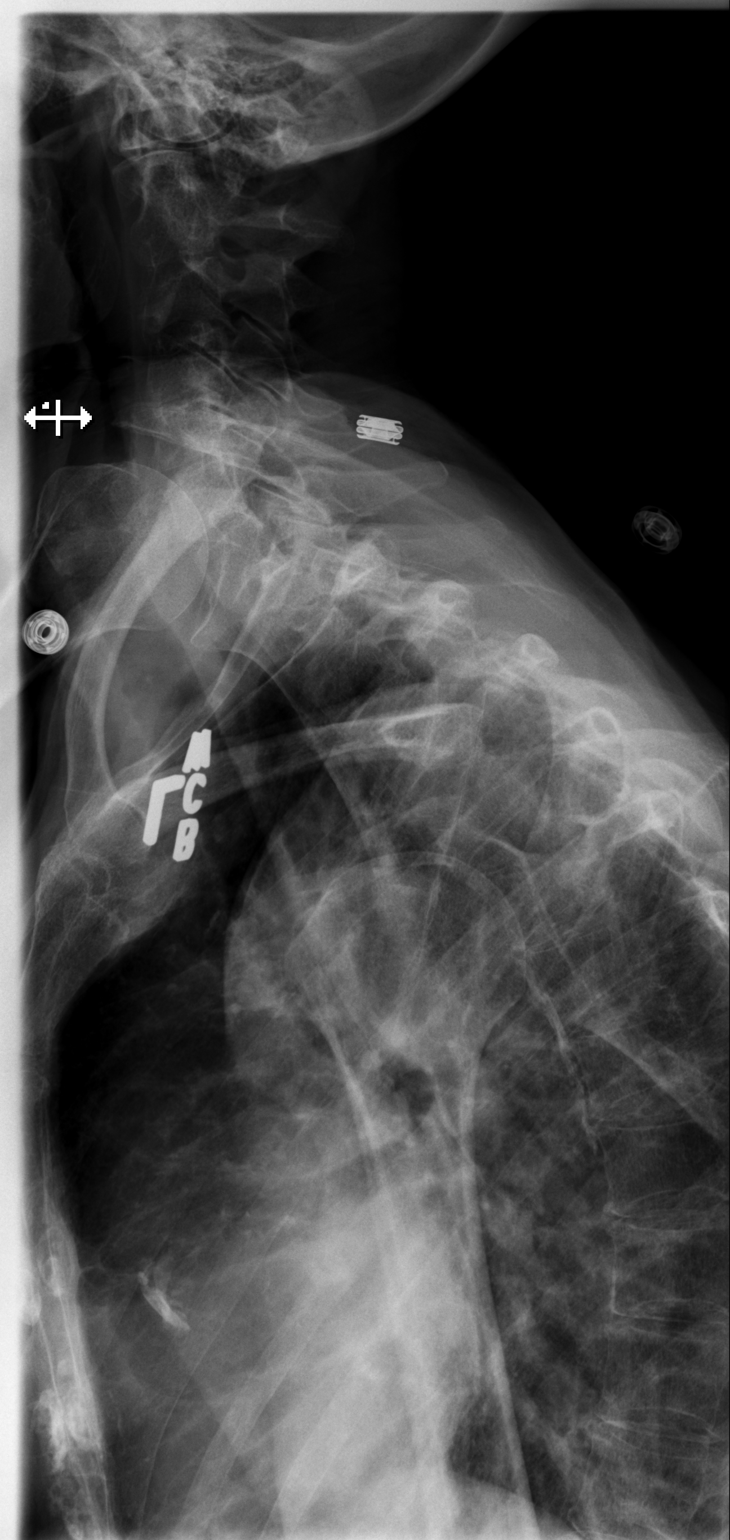

[3 of 3 positions shown; findings below may reference images not displayed]

FINDINGS: Frontal, lateral, and swimmer's views were obtained. There is
thoracolumbar levoscoliosis. There is no fracture or
spondylolisthesis. There is mild disc space narrowing at several
levels.
IMPRESSION: Mild osteoarthritic changes several levels. Scoliosis. No fracture
or spondylolisthesis.

## 2015-06-24 ENCOUNTER — Encounter: Payer: Self-pay | Admitting: Cardiovascular Disease

## 2015-06-24 ENCOUNTER — Ambulatory Visit (INDEPENDENT_AMBULATORY_CARE_PROVIDER_SITE_OTHER): Payer: Medicare Other | Admitting: Cardiovascular Disease

## 2015-06-24 VITALS — BP 120/62 | HR 77 | Ht 60.0 in | Wt 85.8 lb

## 2015-06-24 DIAGNOSIS — I5042 Chronic combined systolic (congestive) and diastolic (congestive) heart failure: Secondary | ICD-10-CM | POA: Diagnosis not present

## 2015-06-24 DIAGNOSIS — I10 Essential (primary) hypertension: Secondary | ICD-10-CM

## 2015-06-24 NOTE — Patient Instructions (Signed)
Medication Instructions:  STOP Aggrenox   Labwork: None Ordered  Testing/Procedures: None Ordered  Follow-Up: Your physician wants you to follow-up in: 1 year with Dr. [name] HashimotoNahser.  You will receive a reminder letter in the mail two months in advance. If you don't receive a letter, please call our office to schedule the follow-up appointment.

## 2015-06-24 NOTE — Progress Notes (Signed)
[name] Date of Birth  July 21, 1922       [facility]    [place] [address]  [address] [place], [place]  [address]   [place], [place]  [address] [phone]     [phone]   Fax  [phone]    Fax [phone]  Problem List: 1. CAD ( presumed) 2. PAD 3. Acute respiratory failure, acute necrotizing pneumonia 4.COPD 5. CHF - EF 40-45% 6.  CKD - stage 3 7. Dementia  History of Present Illness:  [name] is an 81 yo who was recently in the hospital for pneumonia, mild CHF, COPD, and dementia.  She came with her daughter this am.  She is feeling much better.  Denies any chest pain or worsening of her dyspnea.   Feb. 5, 2014:  She is here today with her daughter , Lynden Ang. She was in the hospital in mid December for acute dyspnea - was found to have acute CHF.  She responded to IV lasix .   Her cardiac enzymes were negative this time.  She had a hospitalization in October which resulted in mild elevations of her Troponin levels.     She has continued to have frequent episodes of dyspnea.  These occur suddenly and last 10 minutes.   They are described as a hot, smothering senation - associated with profuse diaphoresis.    She is eating at her nursing home - the diet at times it is fairly salty.  She has had a progressive rise in her creatinine over the past several months ( associated with her Lasix therapy).  She also complains of having a dry mouth ever since starting Lasix therapy.  April 20, 2014:  [name] is doing well.  She has noticed a significant improvement with the NTG patch.    July 1 , 2016:   Has some DOE with walking . No CP  She has had some left shoulder for the past couple of days    Current Outpatient Prescriptions on File Prior to Visit  Medication Sig Dispense Refill  . acetaminophen (TYLENOL) 325 MG tablet Take 2 tablets (650 mg total) by mouth every 6 (six) hours as needed for fever.    Marland Kitchen  albuterol (PROVENTIL) (2.5 MG/3ML) 0.083% nebulizer solution Take 2.5 mg by nebulization 4 (four) times daily.    Marland Kitchen ALPRAZolam (XANAX) 0.5 MG tablet Take 1 tablet (0.5 mg total) by mouth 2 (two) times daily. 30 tablet 0  . amLODipine (NORVASC) 5 MG tablet Take 1 tablet (5 mg total) by mouth daily.    Marland Kitchen aspirin 81 MG chewable tablet Chew 81 mg by mouth every morning.     . Carboxymethylcellul-Glycerin 0.5-0.9 % SOLN Apply 1 drop to eye 4 (four) times daily.    . Cholecalciferol (VITAMIN D3) 400 UNITS CAPS Take 1 capsule by mouth daily.    . clopidogrel (PLAVIX) 75 MG tablet Take 75 mg by mouth every morning.     Marland Kitchen dextromethorphan (DELSYM) 30 MG/5ML liquid Take 2.5 mLs (15 mg total) by mouth at bedtime as needed for cough. 89 mL 0  . dipyridamole-aspirin (AGGRENOX) 200-25 MG per 12 hr capsule Take 1 capsule by mouth 2 (two) times daily.    . feeding supplement, ENSURE COMPLETE, (ENSURE COMPLETE) LIQD Take 237 mLs by mouth 2 (two) times daily between meals.    . furosemide (LASIX) 20 MG tablet Take 20 mg by mouth See admin instructions. Take 1 tablet  by mouth on Monday, Wednesday, Friday, and Saturday.    Marland Kitchen HYDROcodone-acetaminophen (NORCO/VICODIN) 5-325 MG per tablet Take 1 tablet by mouth every 6 (six) hours as needed for moderate pain. 12 tablet 0  . Melatonin 1 MG TABS Take 1 mg by mouth at bedtime.    . metoprolol tartrate (LOPRESSOR) 25 MG tablet Take 25 mg by mouth 2 (two) times daily.    . mirtazapine (REMERON) 7.5 MG tablet Take 7.5 mg by mouth at bedtime.    . nitroGLYCERIN (NITRODUR - DOSED IN MG/24 HR) 0.2 mg/hr patch Place 0.2 mg onto the skin daily.    . nitroGLYCERIN (NITROSTAT) 0.4 MG SL tablet Place 1 tablet (0.4 mg total) under the tongue every 5 (five) minutes as needed for chest pain (for break through chest pain in addition to nitro patch). 25 tablet 5  . pantoprazole (PROTONIX) 40 MG tablet Take 40 mg by mouth daily before breakfast.    . pravastatin (PRAVACHOL) 10 MG tablet  Take 10 mg by mouth at bedtime.     . senna (SENOKOT) 8.6 MG tablet Take 1 tablet by mouth as needed for constipation.    . sodium chloride (OCEAN) 0.65 % SOLN nasal spray Place 2 sprays into both nostrils as needed for congestion.    Marland Kitchen tiZANidine (ZANAFLEX) 4 MG tablet Take 4 mg by mouth 2 (two) times daily.    . traMADol (ULTRAM) 50 MG tablet Take 50 mg by mouth every 8 (eight) hours as needed for moderate pain.     No current facility-administered medications on file prior to visit.    Allergies  Allergen Reactions  . Altace [Ramipril] Other (See Comments)    Reaction unknown  . Ambien [Zolpidem Tartrate] Other (See Comments)    Reaction unknown  . Codeine Other (See Comments)    Reaction unknown  . Demerol [Meperidine] Other (See Comments)    Reaction unknown  . Penicillins Other (See Comments)    Reaction unknown  . Sulfa Antibiotics Other (See Comments)    Reaction unknown  . Versed [Midazolam] Other (See Comments)    Reaction unknown    Past Medical History  Diagnosis Date  . Hypertension   . Peripheral arterial disease   . Cognitive disorder   . Esophageal stricture     prior dilatation in June 2013  . Osteoarthritis   . Anxiety   . UTI (lower urinary tract infection) June 2013  . COPD (chronic obstructive pulmonary disease)     acute respiratory failure November 2013 with PNA  . NSTEMI (non-ST elevated myocardial infarction) November 2013    felt to be due to demand ischemia; managed conservatively; not a candidate for invasive testing due to general frailty  . IBS (irritable bowel syndrome)   . Acute combined systolic and diastolic HF (heart failure)     EF is 40 to 45% per echo November 2013  . Anemia     uncertain etiology  . Dementia   . Anxiety   . Sacral decubitus ulcer   . Necrotic pneumonia   . Chronic respiratory failure with hypoxia 10/16/2012  . COPD with emphysema   . Chronic combined systolic and diastolic heart failure 10/17/2012  .  Stricture and stenosis of esophagus 06/23/2012  . Anemia due to chronic illness 12/10/2012  . HCAP (healthcare-associated pneumonia) 11/07/2014  . Protein-calorie malnutrition, severe 11/09/2014    Past Surgical History  Procedure Laterality Date  . Femoral bypass    . Esophagogastroduodenoscopy  06/21/2012  Procedure: ESOPHAGOGASTRODUODENOSCOPY (EGD);  Surgeon: Theda BelfastPatrick D Hung, MD;  Location: [facility];  Service: Endoscopy;  Laterality: N/A;    History  Smoking status  . Former Smoker  . Quit date: 12/24/1996  Smokeless tobacco  . Never Used    Comment: per dtr, pt quit tobacco in 1998.    History  Alcohol Use No    Family History  Problem Relation Age of Onset  . Coronary artery disease Father   . Coronary artery disease Mother   . Hypertension Mother   . Breast cancer Maternal Grandmother   . Breast cancer Maternal Aunt     Reviw of Systems:  Reviewed in the HPI.  All other systems are negative.  Physical Exam: Blood pressure 120/62, pulse 77, height 5' (1.524 m), weight 38.919 kg (85 lb 12.8 oz), SpO2 92 %. General: Chronically ill-appearing, frail, elderly female  Head: Normocephalic, atraumatic, sclera non-icteric, mucus membranes in her mouth are dry.  Neck: Supple. Carotids are 2 + without bruits. No JVD   Lungs: Clear , slightly decreased breath sounds left base  Heart: RR  Abdomen: Soft, non-tender, non-distended with normal bowel sounds.  Msk:  Strength and tone are normal   Extremities: No clubbing or cyanosis. No edema.   Decreased skin turgur.   Distal pedal pulses are 2+ and equal   Neuro: CN II - XII intact.  Alert and oriented X 3.   Psych:  Normal   ECG: April 28,2015:  NSR at 3263 with PACs.  LVH with repol abn.   Assessment / Plan:   1. CAD ( presumed) - doing well on the NTG patch   2. PAD - s/p stenting many years ago. No claudicatin . Encouraged her to ambulate.   3. Acute respiratory failure, acute necrotizing  pneumonia  4.COPD 5. CHF - EF 40-45% 6.  CKD - stage 3 7. Dementia    Chrishawna Farina, Deloris PingPhilip J, MD  06/24/2015 3:57 PM    [facility] [address],  [address] [place], [place]  [address] Pager [phone] Phone: [phone]; Fax: [phone]   [facility]  [address] [place], [place]  [address] [phone]    Fax [phone]

## 2015-06-29 ENCOUNTER — Encounter: Payer: Self-pay | Admitting: Pulmonary Disease

## 2015-06-29 ENCOUNTER — Ambulatory Visit (INDEPENDENT_AMBULATORY_CARE_PROVIDER_SITE_OTHER): Payer: Medicare Other | Admitting: Pulmonary Disease

## 2015-06-29 VITALS — BP 140/82 | HR 72 | Wt 85.0 lb

## 2015-06-29 DIAGNOSIS — J432 Centrilobular emphysema: Secondary | ICD-10-CM | POA: Diagnosis not present

## 2015-06-29 DIAGNOSIS — J300 Vasomotor rhinitis: Secondary | ICD-10-CM

## 2015-06-29 DIAGNOSIS — J9611 Chronic respiratory failure with hypoxia: Secondary | ICD-10-CM | POA: Diagnosis not present

## 2015-06-29 MED ORDER — IPRATROPIUM BROMIDE 0.03 % NA SOLN: 2.0000 | Freq: Three times a day (TID) | NASAL | Status: DC | PRN

## 2015-06-29 NOTE — Patient Instructions (Addendum)
Atrovent nasal spray up to three times per day as needed for runny nose  Albuterol one vial nebulized four times per day as needed for cough, wheezing, or chest congestion  Increase oxygen to 3 liters with activity  Follow up in 6 months

## 2015-06-29 NOTE — Progress Notes (Signed)
Chief Complaint  Patient presents with  . Follow-up    Pt states worsening SOB with exertion and non-productive cough    History of Present Illness: [name] is a 81 y.o. female with COPD/emphysema and chronic hypoxic respiratory failure.  She has noticed more trouble with her breathing with activity.  She improves after she rests for a few minutes.  She has been getting albuterol 4 times per day.  She does not think she needs it this often, but is scheduled this way.  She is not having cough, wheeze, sputum, or chest congestion.  She denies leg swelling.  She is down 2 lbs since her last visit in November.  She is using 2 liters oxygen 24/7.     TESTS: 10/16/12 Echo >> mod LVH, EF 40 to 45%, grade 1 diastolic dysfx  11/05/12 CT chest >> no PE, small b/l effusions, emphysema, basilar ATX  12/02/12 Spirometry >> FEV1 0.65 (47%), FEV1% 40  PMHx >> HTN, PAD, Anxiety, CAD, combined CHF, IBS, Dementia, PNA, Esophageal stricture  PSHx, Medications, Allergies, Fhx, Shx reviewed.  Physical Exam: BP 140/82 mmHg  Pulse 72  Wt 85 lb (38.556 kg)  SpO2 91%  General - No distress, thin, wearing oxygen ENT - No sinus tenderness, no oral exudate, no LAN, difficulty with hearing Cardiac - s1s2 regular, no murmur Chest - decreased breath sounds, no wheeze Back - No focal tenderness Abd - Soft, non-tender Ext - No edema Neuro - Normal strength Skin - No rashes Psych - normal mood, and behavior   Assessment/Plan:  COPD with emphysema. Plan: - will have her change to albuterol one vial nebulized qid prn only  Chronic hypoxic respiratory failure. Her worsening dyspnea on exertion is likely related to increased supplemental oxygen needed with activity Plan: - will change her to 3 liters oxygen with exertion and 2 liters oxygen at rest - will try to get her a portable pulse oximeter  Rhinitis. Plan: - will have her try atrovent nasal spray tid prn  Updated pt's daughter during  visit   Coralyn HellingVineet Clemens Lachman, MD [facility] Pager:  [phone]

## 2015-10-24 ENCOUNTER — Ambulatory Visit: Payer: Medicare Other | Admitting: Internal Medicine

## 2015-11-01 ENCOUNTER — Encounter: Payer: Self-pay | Admitting: Internal Medicine

## 2015-11-01 ENCOUNTER — Ambulatory Visit: Payer: Medicare Other | Admitting: Adult Health

## 2015-11-01 ENCOUNTER — Ambulatory Visit (INDEPENDENT_AMBULATORY_CARE_PROVIDER_SITE_OTHER): Payer: Medicare Other | Admitting: Internal Medicine

## 2015-11-01 ENCOUNTER — Ambulatory Visit (INDEPENDENT_AMBULATORY_CARE_PROVIDER_SITE_OTHER)
Admission: RE | Admit: 2015-11-01 | Discharge: 2015-11-01 | Disposition: A | Discharge status: Home / Self Care | Payer: Medicare Other | Source: Ambulatory Visit | Attending: Internal Medicine | Admitting: Internal Medicine

## 2015-11-01 VITALS — BP 108/62 | HR 62 | Ht 59.0 in | Wt 83.6 lb

## 2015-11-01 DIAGNOSIS — J449 Chronic obstructive pulmonary disease, unspecified: Secondary | ICD-10-CM | POA: Diagnosis not present

## 2015-11-01 DIAGNOSIS — J9611 Chronic respiratory failure with hypoxia: Secondary | ICD-10-CM | POA: Diagnosis not present

## 2015-11-01 DIAGNOSIS — J432 Centrilobular emphysema: Secondary | ICD-10-CM | POA: Diagnosis not present

## 2015-11-01 NOTE — Progress Notes (Signed)
Chief Complaint  Patient presents with  . Follow-up    Pt reports that she gets pretty winded at night even when wearing o2--daughter would like to know how long she needs to be on nebs and if singulair is needed bc seems like it is causing runny nose    History of Present Illness: [name] is a 81 y.o. female with COPD/emphysema, and chronic hypoxic respiratory failure.  She was in hospital in October 2013  for pneumonia.  [facility].     Her breathing has been doing better.  She is still using 3 liters oxygen 24/7.  She uses advair twice per day.  She is not sure how much this helps.  She uses singulair daily.  She was started on this for nasal congestion, but it hasn't helped.  She has been using albuterol nebulizer three times per day.  She does this because she was told to, and not because of symptoms. rec Stop singulair Continue advair one puff twice per day, and rinse mouth after each use Albuterol one vial nebulized up to 4 times per day as needed for cough, wheeze, or chest congestion Albuterol inhaler two puffs up to 4 times per day as needed for cough, wheeze, or chest congestion Use 2 liters oxygen with exertion and sleep.  You don't need to use oxygen at rest      11/04/2013 f/u ov/Stanly Si re: chronic cough and sob.  Chief Complaint  Patient presents with  . Acute Visit    Pt c/o increased cough x 1 month- prod with moderate clear to white sputum. Sometimes wakes her up at night.   worse sob x months assoc worse cough x one month, more day than night  rx  Zpak 11/04/13  rec Pantoprazole (protonix) 40 mg   Take 30-60 min before first meal of the day   Stop advair and spiriva Increase the neb back to four times daily  Prednisone 10 mg take  4 each am x 2 days,   2 each am x 2 days,  1 each am x 2 days and stop  Robitussin DM or mucinex dm max dose 1200 mg every 12 hours  and supplement if needed with tramadol 50 mg every  Hours as needed for  cough or pain or headache  GERD (REFLUX)  Diet     11/01/2015  Acute  ov/Worley Radermacher re: GOLD III copd/ clinically mostly emphysema Chief Complaint  Patient presents with  . Acute Visit    Increased cough x 2 months- prod with yellow sputum.    Sleeps maybe 45 degrees sleeps fine p xanax s early am cough or noct disturbance   No obvious day to day or daytime variability  to cough  or assoc   cp or chest tightness, subjective wheeze overt sinus or hb symptoms. No unusual exp hx or h/o childhood pna/ asthma or knowledge of premature birth.  Sleeping ok without nocturnal  or early am exacerbation  of respiratory  c/o's or need for noct saba. Also denies any obvious fluctuation of symptoms with weather or environmental changes or other aggravating or alleviating factors except as outlined above   Current Medications, Allergies, Complete Past Medical History, Past Surgical History, Family History, and Social History were reviewed in [facility] electronic medical record.  ROS  The following are not active complaints unless bolded sore throat, dysphagia, dental problems, itching, sneezing,  nasal congestion or excess/ purulent secretions, ear ache,   fever, chills,  sweats, unintended wt loss, pleuritic or exertional cp, hemoptysis,  orthopnea pnd or leg swelling, presyncope, palpitations, heartburn, abdominal pain, anorexia, nausea, vomiting, diarrhea  or change in bowel or urinary habits, change in stools or urine, dysuria,hematuria,  rash, arthralgias, visual complaints, headache, numbness weakness or ataxia or problems with walking or coordination,  change in mood/affect or memory.       .   TESTS: 10/16/12 Echo >> mod LVH, EF 40 to 45%, grade 1 diastolic dysfx 11/05/12 CT chest >> no PE, small b/l effusions, emphysema, basilar ATX 12/02/12 Spirometry >> FEV1 0.65 (47%), FEV1% 40  Past Medical History  Diagnosis Date  . Hypertension   . Peripheral arterial disease   . Cognitive disorder    . Esophageal stricture     prior dilatation in June 2013  . Osteoarthritis   . Anxiety   . UTI (lower urinary tract infection) June 2013  . COPD (chronic obstructive pulmonary disease)     acute respiratory failure November 2013 with PNA  . NSTEMI (non-ST elevated myocardial infarction) November 2013    felt to be due to demand ischemia; managed conservatively; not a candidate for invasive testing due to general frailty  . IBS (irritable bowel syndrome)   . Acute combined systolic and diastolic HF (heart failure)     EF is 40 to 45% per echo November 2013  . Anemia     uncertain etiology  . Dementia   . Anxiety   . Sacral decubitus ulcer        Physical Exam:  Pleasant but frail  W/c bound  wf nad        11/01/2015         84   11/04/13 90 lb (40.824 kg)  01/28/13 91 lb 1.9 oz (41.332 kg)  12/26/12 90 lb 1.9 oz (40.878 kg)       General - Thin, no distress, wearing oxygen ENT - No sinus tenderness, no oral exudate, no LAN Cardiac - s1s2 regular, no murmur Chest - Prolonged exhalation, decreased breath sounds, no wheeze Back - No focal tenderness Abd - Soft, non-tender Ext - No edema Neuro - Normal strength Skin - No rashes Psych - normal mood, and behavior    CXR PA and Lateral:   11/01/2015 :    I personally reviewed images and agree with radiology impression as follows:    1. COPD and chronic interstitial lung disease.  2. Stable cardiomegaly. No CHF.       Outpatient Encounter Prescriptions as of 11/01/2015  Medication Sig  . acetaminophen (TYLENOL) 325 MG tablet Take 2 tablets (650 mg total) by mouth every 6 (six) hours as needed for fever.  Marland Kitchen albuterol (PROVENTIL) (2.5 MG/3ML) 0.083% nebulizer solution Take 2.5 mg by nebulization 2 (two) times daily.  Marland Kitchen amLODipine (NORVASC) 5 MG tablet Take 1 tablet (5 mg total) by mouth daily.  Marland Kitchen aspirin 81 MG chewable tablet Chew 81 mg by mouth every morning.   . Cholecalciferol (VITAMIN D3) 400 UNITS CAPS Take  1 capsule by mouth daily.  . clopidogrel (PLAVIX) 75 MG tablet Take 75 mg by mouth every morning.   Marland Kitchen dextromethorphan (DELSYM) 30 MG/5ML liquid Take 2.5 mLs (15 mg total) by mouth at bedtime as needed for cough.  . famotidine (PEPCID) 20 MG tablet Take 20 mg by mouth at bedtime.  . feeding supplement, ENSURE COMPLETE, (ENSURE COMPLETE) LIQD Take 237 mLs by mouth 2 (two) times daily between meals.  . fluticasone (FLONASE) 50 MCG/ACT  nasal spray Place 2 sprays into both nostrils daily.  . furosemide (LASIX) 20 MG tablet Take 20 mg by mouth See admin instructions. Take 1 tablet by mouth on Monday, Wednesday, Friday, and Saturday.  Marland Kitchen HYDROcodone-acetaminophen (NORCO/VICODIN) 5-325 MG per tablet Take 1 tablet by mouth every 6 (six) hours as needed for moderate pain.  Marland Kitchen ipratropium (ATROVENT) 0.02 % nebulizer solution Take 0.5 mg by nebulization 2 (two) times daily.  Marland Kitchen ipratropium (ATROVENT) 0.03 % nasal spray Place 2 sprays into the nose 3 (three) times daily as needed for rhinitis.  . Loratadine 10 MG CAPS Take 1 capsule by mouth daily.  Marland Kitchen losartan (COZAAR) 25 MG tablet Take 12.5 mg by mouth daily.  . Melatonin 1 MG TABS Take 1 mg by mouth at bedtime.  . metoprolol tartrate (LOPRESSOR) 25 MG tablet Take 25 mg by mouth 2 (two) times daily.  . mirtazapine (REMERON) 7.5 MG tablet Take 7.5 mg by mouth at bedtime.  . nitroGLYCERIN (NITRODUR - DOSED IN MG/24 HR) 0.2 mg/hr patch Place 0.2 mg onto the skin daily.  . nitroGLYCERIN (NITROSTAT) 0.4 MG SL tablet Place 1 tablet (0.4 mg total) under the tongue every 5 (five) minutes as needed for chest pain (for break through chest pain in addition to nitro patch).  . pravastatin (PRAVACHOL) 10 MG tablet Take 10 mg by mouth at bedtime.   . Probiotic Product (ALIGN) 4 MG CAPS Take 1 capsule by mouth daily.  Marland Kitchen senna (SENOKOT) 8.6 MG tablet Take 1 tablet by mouth as needed for constipation.  . sodium chloride (OCEAN) 0.65 % SOLN nasal spray Place 2 sprays into both  nostrils as needed for congestion.  Marland Kitchen tiZANidine (ZANAFLEX) 4 MG tablet Take 4 mg by mouth 2 (two) times daily.  . traMADol (ULTRAM) 50 MG tablet Take 50 mg by mouth every 8 (eight) hours as needed for moderate pain.  . [DISCONTINUED] albuterol (PROVENTIL) (2.5 MG/3ML) 0.083% nebulizer solution Take 2.5 mg by nebulization 4 (four) times daily as needed.  . [DISCONTINUED] ALPRAZolam (XANAX) 0.5 MG tablet Take 1 tablet (0.5 mg total) by mouth 2 (two) times daily.  . [DISCONTINUED] Carboxymethylcellul-Glycerin 0.5-0.9 % SOLN Apply 1 drop to eye 4 (four) times daily.  . [DISCONTINUED] Isopropyl Alcohol (SWIMMERS EAR DROPS) 95 % LIQD Place 3 drops in ear(s) 2 (two) times a week.  . [DISCONTINUED] pantoprazole (PROTONIX) 40 MG tablet Take 40 mg by mouth daily before breakfast.   No facility-administered encounter medications on file as of 11/01/2015.

## 2015-11-01 NOTE — Patient Instructions (Addendum)
GERD (REFLUX)  is an extremely common cause of respiratory symptoms just like yours , many times with no obvious heartburn at all.    It can be treated with medication, but also with lifestyle changes including elevation of the head of your bed (ideally with 6 inch  bed blocks),  Smoking cessation, avoidance of late meals, excessive alcohol, and avoid fatty foods, chocolate, peppermint, colas, red wine, and acidic juices such as orange juice.  NO MINT OR MENTHOL PRODUCTS SO NO COUGH DROPS  USE SUGARLESS CANDY INSTEAD (Jolley ranchers or Stover's or Life Savers) or even ice chips will also do - the key is to swallow to prevent all throat clearing. NO OIL BASED VITAMINS - use powdered substitutes.  Change to duoneb to qid as needed   Change 02 to 3lpm as needed with activity and short of breath  Please remember to go to the  x-ray department downstairs for your tests - we will call you with the results when they are available.  Pulmonary follow up is as needed

## 2015-11-02 ENCOUNTER — Encounter: Payer: Self-pay | Admitting: Internal Medicine

## 2015-11-02 ENCOUNTER — Telehealth: Payer: Self-pay | Admitting: Internal Medicine

## 2015-11-02 NOTE — Telephone Encounter (Signed)
Patient's daughter notified of CXR results.   Nothing further needed. Closing encounter

## 2015-11-02 NOTE — Telephone Encounter (Signed)
Pt seen by MW on yesterday.[name]

## 2015-11-02 NOTE — Assessment & Plan Note (Signed)
Adequate control on present rx, reviewed > no change in rx needed  > 1lpm at rest and 3lpm with activity, reviewed

## 2015-11-02 NOTE — Assessment & Plan Note (Signed)
12/02/12 Spirometry >> FEV1 0.65 (47%), FEV1% 40  Based on this patient has severe COPD but is relatively well compensated on her present regimen with the main problem being at a time, not nocturnal cough that does not exacerbate in the morning typical of a bronchitic cough. More than likely this is related to chronic reflux or postnasal drip syndrome or both.  For now I just recommended that she pay more attention to a reflux diet and continue her present regimen    I had an extended discussion with the patient and daughter  reviewing all relevant studies completed to date and  lasting 15 to 20 minutes of a 25 minute visit    Each maintenance medication was reviewed in detail including most importantly the difference between maintenance and prns and under what circumstances the prns are to be triggered using an action plan format that is not reflected in the computer generated alphabetically organized AVS.    Please see instructions for details which were reviewed in writing and the patient given a copy highlighting the part that I personally wrote and discussed at today's ov.

## 2016-01-12 ENCOUNTER — Encounter: Payer: Self-pay | Admitting: Pulmonary Disease

## 2016-01-12 ENCOUNTER — Ambulatory Visit (INDEPENDENT_AMBULATORY_CARE_PROVIDER_SITE_OTHER)
Admission: RE | Admit: 2016-01-12 | Discharge: 2016-01-12 | Disposition: A | Discharge status: Home / Self Care | Payer: Medicare Other | Source: Ambulatory Visit | Attending: Pulmonary Disease | Admitting: Pulmonary Disease

## 2016-01-12 ENCOUNTER — Ambulatory Visit (INDEPENDENT_AMBULATORY_CARE_PROVIDER_SITE_OTHER): Payer: Medicare Other | Admitting: Pulmonary Disease

## 2016-01-12 VITALS — BP 108/68 | HR 67 | Ht 60.0 in | Wt 83.0 lb

## 2016-01-12 DIAGNOSIS — J9611 Chronic respiratory failure with hypoxia: Secondary | ICD-10-CM

## 2016-01-12 DIAGNOSIS — J432 Centrilobular emphysema: Secondary | ICD-10-CM | POA: Diagnosis not present

## 2016-01-12 DIAGNOSIS — J209 Acute bronchitis, unspecified: Secondary | ICD-10-CM

## 2016-01-12 DIAGNOSIS — J300 Vasomotor rhinitis: Secondary | ICD-10-CM | POA: Diagnosis not present

## 2016-01-12 MED ORDER — IPRATROPIUM BROMIDE 0.03 % NA SOLN: 2.0000 | Freq: Three times a day (TID) | NASAL | Status: DC | PRN

## 2016-01-12 NOTE — Progress Notes (Signed)
Current Outpatient Prescriptions on File Prior to Visit  Medication Sig  . acetaminophen (TYLENOL) 325 MG tablet Take 2 tablets (650 mg total) by mouth every 6 (six) hours as needed for fever.  Marland Kitchen albuterol (PROVENTIL) (2.5 MG/3ML) 0.083% nebulizer solution Take 2.5 mg by nebulization 2 (two) times daily.  Marland Kitchen amLODipine (NORVASC) 5 MG tablet Take 1 tablet (5 mg total) by mouth daily.  Marland Kitchen aspirin 81 MG chewable tablet Chew 81 mg by mouth every morning.   . Cholecalciferol (VITAMIN D3) 400 UNITS CAPS Take 1 capsule by mouth daily.  . clopidogrel (PLAVIX) 75 MG tablet Take 75 mg by mouth every morning.   Marland Kitchen dextromethorphan (DELSYM) 30 MG/5ML liquid Take 2.5 mLs (15 mg total) by mouth at bedtime as needed for cough.  . famotidine (PEPCID) 20 MG tablet Take 20 mg by mouth at bedtime.  . feeding supplement, ENSURE COMPLETE, (ENSURE COMPLETE) LIQD Take 237 mLs by mouth 2 (two) times daily between meals.  . fluticasone (FLONASE) 50 MCG/ACT nasal spray Place 2 sprays into both nostrils daily.  . furosemide (LASIX) 20 MG tablet Take 20 mg by mouth See admin instructions. Take 1 tablet by mouth on Monday, Wednesday, Friday, and Saturday.  Marland Kitchen ipratropium (ATROVENT) 0.02 % nebulizer solution Take 0.5 mg by nebulization 2 (two) times daily.  . Loratadine 10 MG CAPS Take 1 capsule by mouth daily.  Marland Kitchen losartan (COZAAR) 25 MG tablet Take 12.5 mg by mouth daily.  . Melatonin 1 MG TABS Take 1 mg by mouth at bedtime.  . metoprolol tartrate (LOPRESSOR) 25 MG tablet Take 25 mg by mouth 2 (two) times daily.  . mirtazapine (REMERON) 7.5 MG tablet Take 7.5 mg by mouth at bedtime.  . nitroGLYCERIN (NITRODUR - DOSED IN MG/24 HR) 0.2 mg/hr patch Place 0.2 mg onto the skin daily.  . nitroGLYCERIN (NITROSTAT) 0.4 MG SL tablet Place 1 tablet (0.4 mg total) under the tongue every 5 (five) minutes as needed for chest pain (for break through chest pain in addition to nitro patch).  . pravastatin (PRAVACHOL) 10 MG tablet Take 10 mg by  mouth at bedtime.   . Probiotic Product (ALIGN) 4 MG CAPS Take 1 capsule by mouth daily.  Marland Kitchen senna (SENOKOT) 8.6 MG tablet Take 1 tablet by mouth as needed for constipation.  . sodium chloride (OCEAN) 0.65 % SOLN nasal spray Place 2 sprays into both nostrils as needed for congestion.  Marland Kitchen tiZANidine (ZANAFLEX) 4 MG tablet Take 4 mg by mouth 2 (two) times daily.  . traMADol (ULTRAM) 50 MG tablet Take 50 mg by mouth every 8 (eight) hours as needed for moderate pain.   No current facility-administered medications on file prior to visit.     Chief Complaint  Patient presents with  . Follow-up    Pt diagnosed with Bronchitis - cough with thick green mucus production - bad taste. Pt denies any increased SOB, wheeze and chest tightness/CP.  Pt taking ZPAK and Prednisone, given Monday  01/09/16. Would like to discuss getting a cxr today.     Tests 10/16/12 Echo >> mod LVH, EF 40 to 45%, grade 1 diastolic dysfx  11/05/12 CT chest >> no PE, small b/l effusions, emphysema, basilar ATX  12/02/12 Spirometry >> FEV1 0.65 (47%), FEV1% 40  Past medical hx HTN, PAD, Anxiety, CAD, combined CHF, IBS, Dementia, PNA, Esophageal stricture  Past surgical hx, Allergies, Family hx, Social hx all reviewed.  Vital Signs BP 108/68 mmHg  Pulse 67  Ht 5' (1.524 m)  Wt 83 lb (37.649 kg)  BMI 16.21 kg/m2  SpO2 93%  History of Present Illness [name] is a 82 y.o. female with COPD/emphysema and chronic respiratory failure with hypoxia.  She had a fever last weekend.  She was getting more short of breath and developed a cough.  Her phlegm has turned green.  She was getting wheezing also.  She was started on a Zpak and prednisone.  She is feeling better.  She uses her nebulizer bid.  She is still having runny nose.  She was on flonase and atrovent > she stopped one, but not sure which one.  Physical Exam  General - thin, wearing oxygen ENT - no sinus tenderness, clear nasal discharge, no oral  exudate Cardiac - s1s2 regular, no murmur Chest - prolonged exhalation, faint basilar crackles Back - No focal tenderness Abd - Soft, non-tender Ext - No edema Neuro - Normal strength Skin - No rashes Psych - normal mood, and behavior   Assessment/Plan  Acute bronchitis. Plan: - finish Zpak and prednisone - check CXR today  COPD with emphysema. Plan: - continue nebulizer every 4 to 6 hours as needed  Chronic hypoxic respiratory failure with hypoxia. Plan: - continue 3 liters oxygen with exertion and 2 liters oxygen at rest  Vasomotor rhinitis. Plan: - will refill atrovent nasal spray   Patient Instructions  Chest xray today  Atrovent nose spray as needed for runny nose  Follow up in 6 months     Coralyn Helling, MD [facility] Pager:  [phone]

## 2016-01-12 NOTE — Patient Instructions (Signed)
Chest xray today  Atrovent nose spray as needed for runny nose  Follow up in 6 months

## 2016-01-13 ENCOUNTER — Telehealth: Payer: Self-pay | Admitting: Pulmonary Disease

## 2016-01-13 NOTE — Telephone Encounter (Addendum)
Results have been explained to patients daughter [name], expressed understanding. Nothing further needed.

## 2016-01-13 NOTE — Telephone Encounter (Signed)
Dg Chest 2 View  01/13/2016  CLINICAL DATA:  Chest congestion with cough, headaches and chills for 4 days. On antibiotics. EXAM: CHEST  2 VIEW COMPARISON:  11/01/2015 and 11/07/2014 radiographs.  CT 11/05/2012. FINDINGS: The heart size and mediastinal contours are stable. There is aortic atherosclerosis. Chronic changes of severe emphysema are again noted with grossly stable scarring at both lung bases. No superimposed airspace disease, enlarging nodule or significant pleural effusion identified. The bones appear unchanged. IMPRESSION: Stable chronic lung disease with emphysema and basilar scarring. No acute findings demonstrated. Electronically Signed   By: Carey Bullocks M.D.   On: 01/13/2016 09:30    Will have my nurse inform pt that CXR did not show pneumonia.  No change to current tx plan.

## 2016-01-23 ENCOUNTER — Inpatient Hospital Stay (HOSPITAL_COMMUNITY)
Admission: EM | Admit: 2016-01-23 | Discharge: 2016-01-26 | DRG: 193 | Disposition: A | Discharge status: Nursing Home (Includes ICF) | Payer: Medicare Other | Attending: Internal Medicine | Admitting: Internal Medicine

## 2016-01-23 ENCOUNTER — Encounter (HOSPITAL_COMMUNITY): Payer: Self-pay | Admitting: *Deleted

## 2016-01-23 ENCOUNTER — Emergency Department (HOSPITAL_COMMUNITY): Payer: Medicare Other

## 2016-01-23 DIAGNOSIS — E860 Dehydration: Secondary | ICD-10-CM | POA: Diagnosis present

## 2016-01-23 DIAGNOSIS — N184 Chronic kidney disease, stage 4 (severe): Secondary | ICD-10-CM | POA: Diagnosis present

## 2016-01-23 DIAGNOSIS — N179 Acute kidney failure, unspecified: Secondary | ICD-10-CM | POA: Diagnosis present

## 2016-01-23 DIAGNOSIS — J189 Pneumonia, unspecified organism: Secondary | ICD-10-CM

## 2016-01-23 DIAGNOSIS — I130 Hypertensive heart and chronic kidney disease with heart failure and stage 1 through stage 4 chronic kidney disease, or unspecified chronic kidney disease: Secondary | ICD-10-CM | POA: Diagnosis present

## 2016-01-23 DIAGNOSIS — I10 Essential (primary) hypertension: Secondary | ICD-10-CM | POA: Diagnosis present

## 2016-01-23 DIAGNOSIS — F09 Unspecified mental disorder due to known physiological condition: Secondary | ICD-10-CM | POA: Diagnosis present

## 2016-01-23 DIAGNOSIS — Z9981 Dependence on supplemental oxygen: Secondary | ICD-10-CM | POA: Diagnosis not present

## 2016-01-23 DIAGNOSIS — Z7902 Long term (current) use of antithrombotics/antiplatelets: Secondary | ICD-10-CM | POA: Diagnosis not present

## 2016-01-23 DIAGNOSIS — E785 Hyperlipidemia, unspecified: Secondary | ICD-10-CM | POA: Diagnosis present

## 2016-01-23 DIAGNOSIS — I2510 Atherosclerotic heart disease of native coronary artery without angina pectoris: Secondary | ICD-10-CM | POA: Diagnosis present

## 2016-01-23 DIAGNOSIS — E43 Unspecified severe protein-calorie malnutrition: Secondary | ICD-10-CM | POA: Diagnosis present

## 2016-01-23 DIAGNOSIS — I5042 Chronic combined systolic (congestive) and diastolic (congestive) heart failure: Secondary | ICD-10-CM | POA: Diagnosis present

## 2016-01-23 DIAGNOSIS — Z8249 Family history of ischemic heart disease and other diseases of the circulatory system: Secondary | ICD-10-CM

## 2016-01-23 DIAGNOSIS — Z7982 Long term (current) use of aspirin: Secondary | ICD-10-CM

## 2016-01-23 DIAGNOSIS — Z882 Allergy status to sulfonamides status: Secondary | ICD-10-CM

## 2016-01-23 DIAGNOSIS — B3749 Other urogenital candidiasis: Secondary | ICD-10-CM | POA: Diagnosis present

## 2016-01-23 DIAGNOSIS — H9190 Unspecified hearing loss, unspecified ear: Secondary | ICD-10-CM | POA: Diagnosis present

## 2016-01-23 DIAGNOSIS — R531 Weakness: Secondary | ICD-10-CM | POA: Diagnosis present

## 2016-01-23 DIAGNOSIS — Z803 Family history of malignant neoplasm of breast: Secondary | ICD-10-CM

## 2016-01-23 DIAGNOSIS — Z79899 Other long term (current) drug therapy: Secondary | ICD-10-CM

## 2016-01-23 DIAGNOSIS — K589 Irritable bowel syndrome without diarrhea: Secondary | ICD-10-CM | POA: Diagnosis present

## 2016-01-23 DIAGNOSIS — Z888 Allergy status to other drugs, medicaments and biological substances status: Secondary | ICD-10-CM

## 2016-01-23 DIAGNOSIS — Z880 Allergy status to penicillin: Secondary | ICD-10-CM | POA: Diagnosis not present

## 2016-01-23 DIAGNOSIS — F0390 Unspecified dementia without behavioral disturbance: Secondary | ICD-10-CM | POA: Diagnosis present

## 2016-01-23 DIAGNOSIS — Z87891 Personal history of nicotine dependence: Secondary | ICD-10-CM | POA: Diagnosis not present

## 2016-01-23 DIAGNOSIS — Z681 Body mass index (BMI) 19 or less, adult: Secondary | ICD-10-CM | POA: Diagnosis not present

## 2016-01-23 DIAGNOSIS — I739 Peripheral vascular disease, unspecified: Secondary | ICD-10-CM | POA: Diagnosis present

## 2016-01-23 DIAGNOSIS — D72829 Elevated white blood cell count, unspecified: Secondary | ICD-10-CM | POA: Diagnosis present

## 2016-01-23 DIAGNOSIS — J440 Chronic obstructive pulmonary disease with acute lower respiratory infection: Secondary | ICD-10-CM | POA: Diagnosis present

## 2016-01-23 DIAGNOSIS — F419 Anxiety disorder, unspecified: Secondary | ICD-10-CM | POA: Diagnosis present

## 2016-01-23 DIAGNOSIS — J181 Lobar pneumonia, unspecified organism: Secondary | ICD-10-CM | POA: Diagnosis not present

## 2016-01-23 DIAGNOSIS — M1990 Unspecified osteoarthritis, unspecified site: Secondary | ICD-10-CM | POA: Diagnosis present

## 2016-01-23 DIAGNOSIS — J449 Chronic obstructive pulmonary disease, unspecified: Secondary | ICD-10-CM | POA: Diagnosis present

## 2016-01-23 DIAGNOSIS — J9611 Chronic respiratory failure with hypoxia: Secondary | ICD-10-CM | POA: Diagnosis present

## 2016-01-23 DIAGNOSIS — D638 Anemia in other chronic diseases classified elsewhere: Secondary | ICD-10-CM | POA: Diagnosis present

## 2016-01-23 DIAGNOSIS — Z8701 Personal history of pneumonia (recurrent): Secondary | ICD-10-CM | POA: Diagnosis not present

## 2016-01-23 DIAGNOSIS — Z885 Allergy status to narcotic agent status: Secondary | ICD-10-CM

## 2016-01-23 LAB — BASIC METABOLIC PANEL
Anion gap: 16 — ABNORMAL HIGH (ref 5–15)
BUN: 49 mg/dL — ABNORMAL HIGH (ref 6–20)
CO2: 24 mmol/L (ref 22–32)
Calcium: 8.6 mg/dL — ABNORMAL LOW (ref 8.9–10.3)
Chloride: 96 mmol/L — ABNORMAL LOW (ref 101–111)
Creatinine, Ser: 2.49 mg/dL — ABNORMAL HIGH (ref 0.44–1.00)
GFR calc Af Amer: 18 mL/min — ABNORMAL LOW (ref >60)
GFR calc non Af Amer: 16 mL/min — ABNORMAL LOW (ref >60)
Glucose, Bld: 91 mg/dL (ref 65–99)
Potassium: 4.4 mmol/L (ref 3.5–5.1)
Sodium: 136 mmol/L (ref 135–145)

## 2016-01-23 LAB — TSH: TSH: 1.766 u[IU]/mL (ref 0.350–4.500)

## 2016-01-23 LAB — URINALYSIS, ROUTINE W REFLEX MICROSCOPIC
Bilirubin Urine: NEGATIVE
Glucose, UA: NEGATIVE mg/dL
Ketones, ur: NEGATIVE mg/dL
Leukocytes, UA: NEGATIVE
Nitrite: NEGATIVE
Protein, ur: NEGATIVE mg/dL
Specific Gravity, Urine: 1.009 (ref 1.005–1.030)
pH: 5 (ref 5.0–8.0)

## 2016-01-23 LAB — CBC WITH DIFFERENTIAL/PLATELET
Basophils Absolute: 0 10*3/uL (ref 0.0–0.1)
Basophils Relative: 0 %
Eosinophils Absolute: 0.2 10*3/uL (ref 0.0–0.7)
Eosinophils Relative: 2 %
HCT: 36.4 % (ref 36.0–46.0)
Hemoglobin: 11.5 g/dL — ABNORMAL LOW (ref 12.0–15.0)
Lymphocytes Relative: 10 %
Lymphs Abs: 1.1 10*3/uL (ref 0.7–4.0)
MCH: 28.7 pg (ref 26.0–34.0)
MCHC: 31.6 g/dL (ref 30.0–36.0)
MCV: 90.8 fL (ref 78.0–100.0)
Monocytes Absolute: 0.9 10*3/uL (ref 0.1–1.0)
Monocytes Relative: 8 %
Neutro Abs: 8.7 10*3/uL — ABNORMAL HIGH (ref 1.7–7.7)
Neutrophils Relative %: 80 %
Platelets: 243 10*3/uL (ref 150–400)
RBC: 4.01 MIL/uL (ref 3.87–5.11)
RDW: 13.1 % (ref 11.5–15.5)
WBC: 10.9 10*3/uL — ABNORMAL HIGH (ref 4.0–10.5)

## 2016-01-23 LAB — URINE MICROSCOPIC-ADD ON

## 2016-01-23 LAB — TROPONIN I: Troponin I: <0.03 ng/mL (ref <0.031)

## 2016-01-23 MED ORDER — FAMOTIDINE 20 MG PO TABS
20.0000 mg | ORAL_TABLET | Freq: Every day | ORAL | Status: DC
  Administered 2016-01-23 – 2016-01-25 (×3): 20 mg via ORAL
  Filled 2016-01-23 (×4): qty 1

## 2016-01-23 MED ORDER — TIZANIDINE HCL 4 MG PO TABS
4.0000 mg | ORAL_TABLET | Freq: Two times a day (BID) | ORAL | Status: DC
  Administered 2016-01-23 – 2016-01-26 (×6): 4 mg via ORAL
  Filled 2016-01-23 (×7): qty 1

## 2016-01-23 MED ORDER — AMLODIPINE BESYLATE 2.5 MG PO TABS
2.5000 mg | ORAL_TABLET | Freq: Every day | ORAL | Status: DC
  Administered 2016-01-24 – 2016-01-26 (×3): 2.5 mg via ORAL
  Filled 2016-01-23 (×3): qty 1

## 2016-01-23 MED ORDER — FLUCONAZOLE 100 MG PO TABS
100.0000 mg | ORAL_TABLET | ORAL | Status: DC
  Administered 2016-01-24 – 2016-01-26 (×2): 100 mg via ORAL
  Filled 2016-01-23 (×2): qty 1

## 2016-01-23 MED ORDER — CHOLECALCIFEROL 10 MCG (400 UNIT) PO TABS
400.0000 [IU] | ORAL_TABLET | Freq: Every day | ORAL | Status: DC
  Administered 2016-01-24 – 2016-01-26 (×3): 400 [IU] via ORAL
  Filled 2016-01-23 (×3): qty 1

## 2016-01-23 MED ORDER — LEVOFLOXACIN IN D5W 500 MG/100ML IV SOLN
500.0000 mg | Freq: Once | INTRAVENOUS | Status: AC
Start: 2016-01-23 — End: 2016-01-23
  Administered 2016-01-23: 500 mg via INTRAVENOUS
  Filled 2016-01-23: qty 100

## 2016-01-23 MED ORDER — PRAVASTATIN SODIUM 10 MG PO TABS
10.0000 mg | ORAL_TABLET | Freq: Every day | ORAL | Status: DC
  Administered 2016-01-23 – 2016-01-25 (×3): 10 mg via ORAL
  Filled 2016-01-23 (×4): qty 1

## 2016-01-23 MED ORDER — POLYETHYLENE GLYCOL 3350 17 G PO PACK: 17.0000 g | PACK | Freq: Every day | ORAL | Status: DC | PRN

## 2016-01-23 MED ORDER — SODIUM CHLORIDE 0.9 % IV BOLUS (SEPSIS)
500.0000 mL | Freq: Once | INTRAVENOUS | Status: AC
  Administered 2016-01-23: 500 mL via INTRAVENOUS

## 2016-01-23 MED ORDER — LEVOFLOXACIN IN D5W 500 MG/100ML IV SOLN
500.0000 mg | INTRAVENOUS | Status: DC
  Administered 2016-01-25: 500 mg via INTRAVENOUS
  Filled 2016-01-23: qty 100

## 2016-01-23 MED ORDER — SODIUM CHLORIDE 0.9 % IV SOLN
INTRAVENOUS | Status: DC
  Administered 2016-01-24: 06:00:00 via INTRAVENOUS

## 2016-01-23 MED ORDER — ALBUTEROL SULFATE (2.5 MG/3ML) 0.083% IN NEBU
2.5000 mg | INHALATION_SOLUTION | Freq: Two times a day (BID) | RESPIRATORY_TRACT | Status: DC
  Administered 2016-01-23 – 2016-01-26 (×6): 2.5 mg via RESPIRATORY_TRACT
  Filled 2016-01-23 (×6): qty 3

## 2016-01-23 MED ORDER — LEVOFLOXACIN IN D5W 750 MG/150ML IV SOLN
750.0000 mg | INTRAVENOUS | Status: DC
  Filled 2016-01-23: qty 150

## 2016-01-23 MED ORDER — IPRATROPIUM BROMIDE 0.02 % IN SOLN: 0.5000 mg | Freq: Four times a day (QID) | RESPIRATORY_TRACT | Status: DC | PRN

## 2016-01-23 MED ORDER — DOCUSATE SODIUM 100 MG PO CAPS
100.0000 mg | ORAL_CAPSULE | Freq: Every day | ORAL | Status: DC
  Administered 2016-01-24 – 2016-01-26 (×3): 100 mg via ORAL
  Filled 2016-01-23 (×3): qty 1

## 2016-01-23 MED ORDER — MELATONIN 1 MG PO TABS: 1.0000 mg | ORAL_TABLET | Freq: Every day | ORAL | Status: DC

## 2016-01-23 MED ORDER — SODIUM CHLORIDE 0.9 % IV SOLN: INTRAVENOUS | Status: DC

## 2016-01-23 MED ORDER — POLYVINYL ALCOHOL 1.4 % OP SOLN
1.0000 [drp] | Freq: Four times a day (QID) | OPHTHALMIC | Status: DC
  Administered 2016-01-24 – 2016-01-26 (×6): 1 [drp] via OPHTHALMIC
  Filled 2016-01-23: qty 15

## 2016-01-23 MED ORDER — DEXTROMETHORPHAN POLISTIREX ER 30 MG/5ML PO SUER: 15.0000 mg | Freq: Every evening | ORAL | Status: DC | PRN

## 2016-01-23 MED ORDER — LORATADINE 10 MG PO TABS
10.0000 mg | ORAL_TABLET | Freq: Every day | ORAL | Status: DC
  Administered 2016-01-23 – 2016-01-26 (×4): 10 mg via ORAL
  Filled 2016-01-23 (×4): qty 1

## 2016-01-23 MED ORDER — ONDANSETRON HCL 4 MG/2ML IJ SOLN
4.0000 mg | Freq: Once | INTRAMUSCULAR | Status: AC
  Administered 2016-01-23: 4 mg via INTRAVENOUS
  Filled 2016-01-23: qty 2

## 2016-01-23 MED ORDER — LORATADINE 10 MG PO CAPS: 1.0000 | ORAL_CAPSULE | Freq: Every day | ORAL | Status: DC

## 2016-01-23 MED ORDER — ASPIRIN 81 MG PO CHEW
81.0000 mg | CHEWABLE_TABLET | Freq: Every morning | ORAL | Status: DC
  Administered 2016-01-24 – 2016-01-26 (×3): 81 mg via ORAL
  Filled 2016-01-23 (×3): qty 1

## 2016-01-23 MED ORDER — ACETAMINOPHEN 325 MG PO TABS
650.0000 mg | ORAL_TABLET | Freq: Four times a day (QID) | ORAL | Status: DC | PRN
  Administered 2016-01-24: 650 mg via ORAL
  Filled 2016-01-23: qty 2

## 2016-01-23 MED ORDER — TRAMADOL HCL 50 MG PO TABS
50.0000 mg | ORAL_TABLET | Freq: Once | ORAL | Status: AC
  Administered 2016-01-23: 50 mg via ORAL
  Filled 2016-01-23: qty 1

## 2016-01-23 MED ORDER — SODIUM CHLORIDE 0.9 % IV SOLN
INTRAVENOUS | Status: DC
  Administered 2016-01-23: 17:00:00 via INTRAVENOUS

## 2016-01-23 MED ORDER — RISAQUAD PO CAPS
1.0000 | ORAL_CAPSULE | Freq: Every day | ORAL | Status: DC
  Administered 2016-01-24 – 2016-01-26 (×3): 1 via ORAL
  Filled 2016-01-23 (×3): qty 1

## 2016-01-23 MED ORDER — ALPRAZOLAM 0.5 MG PO TABS
0.5000 mg | ORAL_TABLET | ORAL | Status: DC | PRN
  Administered 2016-01-23 – 2016-01-26 (×4): 0.5 mg via ORAL
  Filled 2016-01-23 (×4): qty 1

## 2016-01-23 MED ORDER — MIRTAZAPINE 7.5 MG PO TABS
7.5000 mg | ORAL_TABLET | Freq: Every day | ORAL | Status: DC
  Administered 2016-01-23 – 2016-01-25 (×3): 7.5 mg via ORAL
  Filled 2016-01-23 (×4): qty 1

## 2016-01-23 MED ORDER — HYDROCODONE-ACETAMINOPHEN 5-325 MG PO TABS
1.0000 | ORAL_TABLET | Freq: Four times a day (QID) | ORAL | Status: DC | PRN
  Filled 2016-01-23: qty 1

## 2016-01-23 MED ORDER — LOSARTAN POTASSIUM 25 MG PO TABS
12.5000 mg | ORAL_TABLET | Freq: Every day | ORAL | Status: DC
  Administered 2016-01-24 – 2016-01-26 (×3): 12.5 mg via ORAL
  Filled 2016-01-23 (×3): qty 0.5

## 2016-01-23 MED ORDER — SENNA 8.6 MG PO TABS
1.0000 | ORAL_TABLET | ORAL | Status: DC | PRN
  Administered 2016-01-24: 8.6 mg via ORAL
  Filled 2016-01-23: qty 1

## 2016-01-23 MED ORDER — CLOPIDOGREL BISULFATE 75 MG PO TABS
75.0000 mg | ORAL_TABLET | Freq: Every morning | ORAL | Status: DC
  Administered 2016-01-24 – 2016-01-26 (×3): 75 mg via ORAL
  Filled 2016-01-23 (×3): qty 1

## 2016-01-23 MED ORDER — METOPROLOL TARTRATE 25 MG PO TABS
25.0000 mg | ORAL_TABLET | Freq: Two times a day (BID) | ORAL | Status: DC
  Administered 2016-01-23 – 2016-01-26 (×6): 25 mg via ORAL
  Filled 2016-01-23 (×7): qty 1

## 2016-01-23 NOTE — H&P (Signed)
[facility] History and Physical  [name] JYN:[id number] DOB: 04-24-1922 DOA: 01/23/2016  Referring physician: ER physician: Raeford Razor  PCP: [name], MD  Chief Complaint: weakness, fatigue   HPI:  82 year old female with past medical history of CAD (on aspirin and plavix), hypertension, dyslipidemia, chronic combined CHF ( 2 D ECHO in 201 with EF 35-40% and grade 1 DD), COPD on oxygen who presented to [facility] ED with weakness and fatigue and poor po intake over past few days prior to this admission. She was recently seen in pulmonary office and given azithromycin and prednisone. No reports of chest pain, shortness of breath, palpitations. She has intermitted cough, non productive. No leg swelling. No abdominal pain, no vomiting.   In ED, BP was 100/49, HR 62, R 16. Oxygen saturation was 90-96% with [place] oxygen support. Blood work showed WBC count 10.9, hemoglobin 11.5, creatinine 2.49 (baseline 1.5). She was given Levaquin in ED. She was admitted for further management of dehydration, weakness, pneumonia and worsening renal failure.   Assessment & Plan    Principal Problem:   Chronic respiratory failure with hypoxia (HCC) / COPD GOLD III - Stable respiratory status - Continue oxygen support via [place] to keep O2 sats above 90%  Active Problems:   Lobar pneumonia, unspecified organism (HCC) / Leukocytosis - Was on avelox outpatient which she was supposed to finish tthrough 2/3 or 2/4 so will continue Levaquin since out pharmacy does not carry avelox - Pneumonia order set utilized - Follow up blood and resp cultures results - Follow up legionella and strep pneumonia results      Chronic combined systolic and diastolic heart failure (HCC) - 2 D ECHO in 2016 F 35-40% with grade 1 diastolic dysfunction - She is on low rate IV fluids for next 24 hours - Reassess IV fluids daily and watch for fluid overload     CAD - Continue aspirin and plavix     Protein-calorie malnutrition,  severe (HCC) - Nutrition consulted       Acute renal failure superimposed on stage 4 chronic kidney disease (HCC) / Dehydration - Baseline Cr 1.5 in 10/2014 and on this admission 2.5 - Likely from lasix and dehydration - Continue low rate IV fluids - Follow up BMP in am    Anemia of chronic disease - Due to CKD - Hgb 11.5 - Stable     Yeast UTI - Start fluconazole - F/U urine culture results    Benign essential HTN - Continue Norvasc, metoprolol, losartan    Dyslipidemia - Continue Pravachol    DVT prophylaxis:  - SCD's bilaterally   Radiological Exams on Admission: Dg Chest 2 View 01/23/2016  Hazy right base laterally atelectasis or infiltrate. Hyperinflation. No pulmonary edema. Osteopenia and mild degenerative changes thoracic spine. Electronically Signed   By: Natasha Mead M.D.   On: 01/23/2016 16:28     Code Status: Full Family Communication: Plan of care discussed with the patient and her daughter  Disposition Plan: Admit for further evaluation, medical floor   Manson Passey, MD  [facility] Pager [phone]  Time spent in minutes: 75 minutes  Review of Systems:  Constitutional: Negative for fever, chills and malaise/fatigue. Negative for diaphoresis.  HENT: Negative for hearing loss, ear pain, nosebleeds, congestion, sore throat, neck pain, tinnitus and ear discharge.   Eyes: Negative for blurred vision, double vision, photophobia, pain, discharge and redness.  Respiratory: Negative for cough, hemoptysis, sputum production, shortness of breath, wheezing and stridor.   Cardiovascular: Negative  for chest pain, palpitations, orthopnea, claudication and leg swelling.  Gastrointestinal: Negative for nausea, vomiting and abdominal pain. Negative for heartburn, constipation, blood in stool and melena.  Genitourinary: Negative for dysuria, urgency, frequency, hematuria and flank pain.  Musculoskeletal: Negative for myalgias, back pain, joint pain and falls.  Skin:  Negative for itching and rash.  Neurological: Negative for dizziness and weakness. Negative for tingling, tremors, sensory change, speech change, focal weakness, loss of consciousness and headaches.  Endo/Heme/Allergies: Negative for environmental allergies and polydipsia. Does not bruise/bleed easily.  Psychiatric/Behavioral: Negative for suicidal ideas. The patient is not nervous/anxious.      Past Medical History  Diagnosis Date  . Hypertension   . Peripheral arterial disease (HCC)   . Cognitive disorder   . Esophageal stricture     prior dilatation in June 2013  . Osteoarthritis   . Anxiety   . UTI (lower urinary tract infection) June 2013  . COPD (chronic obstructive pulmonary disease) (HCC)     acute respiratory failure November 2013 with PNA  . NSTEMI (non-ST elevated myocardial infarction) [facility]) November 2013    felt to be due to demand ischemia; managed conservatively; not a candidate for invasive testing due to general frailty  . IBS (irritable bowel syndrome)   . Acute combined systolic and diastolic HF (heart failure) (HCC)     EF is 40 to 45% per echo November 2013  . Anemia     uncertain etiology  . Dementia   . Anxiety   . Sacral decubitus ulcer   . Necrotic pneumonia (HCC)   . Chronic respiratory failure with hypoxia (HCC) 10/16/2012  . COPD with emphysema (HCC)   . Chronic combined systolic and diastolic heart failure (HCC) 10/17/2012  . Stricture and stenosis of esophagus 06/23/2012  . Anemia due to chronic illness 12/10/2012  . HCAP (healthcare-associated pneumonia) 11/07/2014  . Protein-calorie malnutrition, severe (HCC) 11/09/2014   Past Surgical History  Procedure Laterality Date  . Femoral bypass    . Esophagogastroduodenoscopy  06/21/2012    Procedure: ESOPHAGOGASTRODUODENOSCOPY (EGD);  Surgeon: Theda Belfast, MD;  Location: [facility];  Service: Endoscopy;  Laterality: N/A;   Social History:  reports that she quit smoking about 19 years ago. She has  never used smokeless tobacco. She reports that she does not drink alcohol or use illicit drugs.  Allergies  Allergen Reactions  . Altace [Ramipril] Other (See Comments)    Reaction unknown  . Ambien [Zolpidem Tartrate] Other (See Comments)    Reaction unknown  . Codeine Other (See Comments)    Reaction unknown  . Demerol [Meperidine] Other (See Comments)    Reaction unknown  . Penicillins Other (See Comments)    Reaction unknown did PCN reaction causing immediate rash, facial/tongue/throat swelling, SOB or lightheadedness with hypotension:  did PCN reaction causing severe rash involving mucus membranes or skin necrosis:  Did PCN reaction that required hospitalization  Did PCN reaction occurring within the last 10 years:  If all of the above answers are "NO", then may proceed with Cephalosporin use. Listed on MAR, unable to answer questions  . Sulfa Antibiotics Other (See Comments)    Reaction unknown  . Versed [Midazolam] Other (See Comments)    Reaction unknown    Family History:  Family History  Problem Relation Age of Onset  . Coronary artery disease Father   . Coronary artery disease Mother   . Hypertension Mother   . Breast cancer Maternal Grandmother   . Breast cancer Maternal Aunt  Prior to Admission medications   Medication Sig Start Date End Date Taking? Authorizing Provider  acetaminophen (TYLENOL) 325 MG tablet Take 2 tablets (650 mg total) by mouth every 6 (six) hours as needed for fever. 11/12/14  Yes Christina P Rama, MD  albuterol (PROVENTIL) (2.5 MG/3ML) 0.083% nebulizer solution Take 2.5 mg by nebulization 2 (two) times daily.   Yes Historical Provider, MD  ALPRAZolam Prudy Feeler) 0.5 MG tablet Take 0.5 mg by mouth as needed for anxiety or sleep.   Yes Historical Provider, MD  amLODipine (NORVASC) 2.5 MG tablet Take 2.5 mg by mouth daily.   Yes Historical Provider, MD  aspirin 81 MG chewable tablet Chew 81 mg by mouth every morning.    Yes Historical  Provider, MD  Carboxymethylcellul-Glycerin 0.5-0.9 % SOLN Place 1 drop into both eyes 4 (four) times daily.   Yes Historical Provider, MD  Cholecalciferol (VITAMIN D3) 400 UNITS CAPS Take 1 capsule by mouth daily.   Yes Historical Provider, MD  clopidogrel (PLAVIX) 75 MG tablet Take 75 mg by mouth every morning.    Yes Historical Provider, MD  dextromethorphan (DELSYM) 30 MG/5ML liquid Take 2.5 mLs (15 mg total) by mouth at bedtime as needed for cough. 11/12/14  Yes Christina P Rama, MD  docusate sodium (COLACE) 100 MG capsule Take 100 mg by mouth daily.    Yes Historical Provider, MD  famotidine (PEPCID) 20 MG tablet Take 20 mg by mouth at bedtime.   Yes Historical Provider, MD  furosemide (LASIX) 20 MG tablet Take 20 mg by mouth See admin instructions. Take 1 tablet by mouth on Monday, Wednesday, Friday, and Saturday.   Yes Historical Provider, MD  HYDROcodone-acetaminophen (NORCO/VICODIN) 5-325 MG tablet Take 1 tablet by mouth every 6 (six) hours as needed for moderate pain.   Yes Historical Provider, MD  ipratropium (ATROVENT) 0.02 % nebulizer solution Take 0.5 mg by nebulization every 6 (six) hours as needed for wheezing or shortness of breath.    Yes Historical Provider, MD  ipratropium (ATROVENT) 0.03 % nasal spray Place 2 sprays into the nose 3 (three) times daily as needed for rhinitis. 01/12/16  Yes Coralyn Helling, MD  Loratadine 10 MG CAPS Take 1 capsule by mouth daily.   Yes Historical Provider, MD  losartan (COZAAR) 25 MG tablet Take 12.5 mg by mouth daily.   Yes Historical Provider, MD  Melatonin 1 MG TABS Take 1 mg by mouth at bedtime.   Yes Historical Provider, MD  metoprolol tartrate (LOPRESSOR) 25 MG tablet Take 25 mg by mouth 2 (two) times daily.   Yes Historical Provider, MD  mirtazapine (REMERON) 7.5 MG tablet Take 7.5 mg by mouth at bedtime.   Yes Historical Provider, MD  moxifloxacin (AVELOX) 400 MG tablet Take 400 mg by mouth daily at 8 pm.   Yes Historical Provider, MD   nitroGLYCERIN (NITRODUR - DOSED IN MG/24 HR) 0.2 mg/hr patch Place 0.2 mg onto the skin daily.   Yes Historical Provider, MD  Nutritional Supplements (NUTRITIONAL SHAKE PO) Take 1 Container by mouth 3 (three) times daily. Health Shakes TID   Yes Historical Provider, MD  PEG 3350-KCl-Na Bicarb-NaCl (TRILYTE PO) Take 8 oz by mouth every Monday. Drink 8 oz glass of Trilyte bowel prep every 15-30 minutes until 1/4 solution is gone every monday   Yes Historical Provider, MD  polyethylene glycol (MIRALAX / GLYCOLAX) packet Take 17 g by mouth daily as needed for moderate constipation.   Yes Historical Provider, MD  pravastatin (PRAVACHOL) 10 MG  tablet Take 10 mg by mouth at bedtime.    Yes Historical Provider, MD  Probiotic Product (ALIGN) 4 MG CAPS Take 1 capsule by mouth daily.   Yes Historical Provider, MD  senna (SENOKOT) 8.6 MG tablet Take 1 tablet by mouth as needed for constipation.   Yes Historical Provider, MD  tiZANidine (ZANAFLEX) 4 MG tablet Take 4 mg by mouth 2 (two) times daily.   Yes Historical Provider, MD  traMADol (ULTRAM) 50 MG tablet Take 50 mg by mouth every 8 (eight) hours as needed for moderate pain.   Yes Historical Provider, MD  amLODipine (NORVASC) 5 MG tablet Take 1 tablet (5 mg total) by mouth daily. Patient not taking: Reported on 01/23/2016 11/12/14   Maryruth Bun Rama, MD  azithromycin (ZITHROMAX) 250 MG tablet Take 250 mg by mouth daily. Reported on 01/23/2016    Historical Provider, MD  feeding supplement, ENSURE COMPLETE, (ENSURE COMPLETE) LIQD Take 237 mLs by mouth 2 (two) times daily between meals. Patient not taking: Reported on 01/23/2016 11/12/14   Maryruth Bun Rama, MD  nitroGLYCERIN (NITROSTAT) 0.4 MG SL tablet Place 1 tablet (0.4 mg total) under the tongue every 5 (five) minutes as needed for chest pain (for break through chest pain in addition to nitro patch). Patient not taking: Reported on 01/23/2016 01/28/13   Vesta Mixer, MD   Physical Exam: Filed Vitals:    01/23/16 1419 01/23/16 1638  BP: 100/49 104/77  Pulse: 62 74  Temp: 97.6 F (36.4 C)   TempSrc: Oral   Resp: 16 16  SpO2: 92% 90%    Physical Exam  Constitutional: Appears well-developed and well-nourished. No distress.  HENT: Normocephalic. No tonsillar erythema or exudates Eyes: Conjunctivae are normal. No scleral icterus.  Neck: Normal ROM. Neck supple. No JVD. No tracheal deviation. No thyromegaly.  CVS: RRR, S1/S2 +, no murmurs, no gallops, no carotid bruit.  Pulmonary: diminished breath sounds, no stridor, rhonchi, wheezes, rales.  Abdominal: Soft. BS +,  no distension, tenderness, rebound or guarding.  Musculoskeletal: Normal range of motion. No edema and no tenderness.  Lymphadenopathy: No lymphadenopathy noted, cervical, inguinal. Neuro: Alert. Normal reflexes, muscle tone coordination. No focal neurologic deficits. Skin: Skin is warm and dry. No rash noted.  No erythema. No pallor.  Psychiatric: Normal mood and affect. Behavior, judgment, thought content normal.   Labs on Admission:  Basic Metabolic Panel:  Recent Labs Lab 01/23/16 1555  NA 136  K 4.4  CL 96*  CO2 24  GLUCOSE 91  BUN 49*  CREATININE 2.49*  CALCIUM 8.6*   Liver Function Tests: No results for input(s): AST, ALT, ALKPHOS, BILITOT, PROT, ALBUMIN in the last 168 hours. No results for input(s): LIPASE, AMYLASE in the last 168 hours. No results for input(s): AMMONIA in the last 168 hours. CBC:  Recent Labs Lab 01/23/16 1555  WBC 10.9*  NEUTROABS 8.7*  HGB 11.5*  HCT 36.4  MCV 90.8  PLT 243   Cardiac Enzymes:  Recent Labs Lab 01/23/16 1555  TROPONINI <0.03   BNP: Invalid input(s): POCBNP CBG: No results for input(s): GLUCAP in the last 168 hours.  If 7PM-7AM, please contact night-coverage [email or web address] Password [email or web address] 01/23/2016, 6:09 PM

## 2016-01-23 NOTE — Clinical Social Work Note (Signed)
Clinical Social Work Assessment  Patient Details  Name: [name] MRN: [id number] Date of Birth: [birth year]  Date of referral:  01/23/16               Reason for consult:   (Patient is from [place] sought to share information with:   (None.) Permission granted to share information::  No  Name::        Agency::     Relationship::     Contact Information:     Housing/Transportation Living arrangements for the past 2 months:  [place] of Information:  Patient, Adult Children Patient Interpreter Needed:  None Criminal Activity/Legal Involvement Pertinent to Current Situation/Hospitalization:  No - Comment as needed Significant Relationships:  Adult Children (Daughter states she is the patient's POA.) Lives with:  Facility Resident Do you feel safe going back to the place where you live?  Yes Need for family participation in patient care:  Yes (Comment) (Daughter states she is patient's POA and is primary support.)  Care giving concerns:  There are no care giving concerns at this time. Patient is currently a resident at [facility] [address]. Daughter states patient has been a resident at the facility since 2012.   Social Worker assessment / plan:  CSW met with patient at bedside. Patient was extremely hard of hearing. Daughter was present, who is also the patient's POA. Per note, note patient presents to [facility] due to abdominal pain. However, daughter states patient also has pneumonia and not eating or drinking. Daughter states patient vomited last night.  Daughter states that patient does no receive assistance with completing ADL's. Daughter states patient can complete her own ADL's. However, she says that staff at facility provides medication management.  Daughter states that upon discharge she would feel safe with patient returning back to facility. Daughter and patient state that they do not have any questions at this time for  CSW.  Employment status:  Education officer, museum information:  Medicare PT Recommendations:  Not assessed at this time Information / Referral to community resources:   (Patient does not need referral at this time. She is currently at facility. )  Patient/Family's Response to care:  Patient and daughter are aware of admission. Patient is appropriate at this time.  Patient/Family's Understanding of and Emotional Response to Diagnosis, Current Treatment, and Prognosis:  Patient and daughter state no questions for CSW at this time.  Emotional Assessment Appearance:  Appears stated age Attitude/Demeanor/Rapport:   (Appropriate. ) Affect (typically observed):  Accepting, Appropriate Orientation:  Oriented to Self, Oriented to Place, Oriented to  Time, Oriented to Situation Alcohol / Substance use:  Not Applicable Psych involvement (Current and /or in the community):  No (Comment)  Discharge Needs  Concerns to be addressed:  Adjustment to Illness Readmission within the last 30 days:  No Current discharge risk:  None Barriers to Discharge:  No Barriers Identified   Bernita Buffy, LCSW 01/23/2016, 6:01 PM

## 2016-01-23 NOTE — ED Provider Notes (Signed)
CSN: [id number]     Arrival date & time 01/23/16  1404 History   First MD Initiated Contact with Patient 01/23/16 1501     Chief Complaint  Patient presents with  . Abdominal Pain  . Failure To Thrive     (Consider location/radiation/quality/duration/timing/severity/associated sxs/prior Treatment) HPI   82 year old female presented with her daughter for evaluation of generalized weakness/fatigue. Progressively worsening over the last week. Patient recently diagnosed with pneumonia. Currently on Avelox. Since being diagnosed she has had anorexia. Her daughter reports that she has a poor appetite at baseline, but currently worse. She states that her "stomach doesn't feel 100%" but denies pain. Has had nausea and vomiting yesterday but denies currently. Has had chills. History of COPD on 2 L of oxygen at baseline. No acute respiratory complaints. She lives in assisted living. Daughter reports that she is at her baseline mental status. No recent falls.  Past Medical History  Diagnosis Date  . Hypertension   . Peripheral arterial disease (HCC)   . Cognitive disorder   . Esophageal stricture     prior dilatation in June 2013  . Osteoarthritis   . Anxiety   . UTI (lower urinary tract infection) June 2013  . COPD (chronic obstructive pulmonary disease) (HCC)     acute respiratory failure November 2013 with PNA  . NSTEMI (non-ST elevated myocardial infarction) [facility]) November 2013    felt to be due to demand ischemia; managed conservatively; not a candidate for invasive testing due to general frailty  . IBS (irritable bowel syndrome)   . Acute combined systolic and diastolic HF (heart failure) (HCC)     EF is 40 to 45% per echo November 2013  . Anemia     uncertain etiology  . Dementia   . Anxiety   . Sacral decubitus ulcer   . Necrotic pneumonia (HCC)   . Chronic respiratory failure with hypoxia (HCC) 10/16/2012  . COPD with emphysema (HCC)   . Chronic combined systolic and diastolic  heart failure (HCC) 40/98/1191  . Stricture and stenosis of esophagus 06/23/2012  . Anemia due to chronic illness 12/10/2012  . HCAP (healthcare-associated pneumonia) 11/07/2014  . Protein-calorie malnutrition, severe (HCC) 11/09/2014   Past Surgical History  Procedure Laterality Date  . Femoral bypass    . Esophagogastroduodenoscopy  06/21/2012    Procedure: ESOPHAGOGASTRODUODENOSCOPY (EGD);  Surgeon: Theda Belfast, MD;  Location: [facility];  Service: Endoscopy;  Laterality: N/A;   Family History  Problem Relation Age of Onset  . Coronary artery disease Father   . Coronary artery disease Mother   . Hypertension Mother   . Breast cancer Maternal Grandmother   . Breast cancer Maternal Aunt    Social History  Substance Use Topics  . Smoking status: Former Smoker    Quit date: 12/24/1996  . Smokeless tobacco: Never Used     Comment: per dtr, pt quit tobacco in 1998.  Marland Kitchen Alcohol Use: No   OB History    No data available     Review of Systems  All systems reviewed and negative, other than as noted in HPI.   Allergies  Altace; Ambien; Codeine; Demerol; Penicillins; Sulfa antibiotics; and Versed  Home Medications   Prior to Admission medications   Medication Sig Start Date End Date Taking? Authorizing Provider  acetaminophen (TYLENOL) 325 MG tablet Take 2 tablets (650 mg total) by mouth every 6 (six) hours as needed for fever. 11/12/14  Yes Maryruth Bun Rama, MD  albuterol (PROVENTIL) (2.5 MG/3ML)  0.083% nebulizer solution Take 2.5 mg by nebulization 2 (two) times daily.   Yes Historical Provider, MD  ALPRAZolam Prudy Feeler) 0.5 MG tablet Take 0.5 mg by mouth as needed for anxiety or sleep.   Yes Historical Provider, MD  amLODipine (NORVASC) 2.5 MG tablet Take 2.5 mg by mouth daily.   Yes Historical Provider, MD  aspirin 81 MG chewable tablet Chew 81 mg by mouth every morning.    Yes Historical Provider, MD  Carboxymethylcellul-Glycerin 0.5-0.9 % SOLN Place 1 drop into both eyes 4  (four) times daily.   Yes Historical Provider, MD  Cholecalciferol (VITAMIN D3) 400 UNITS CAPS Take 1 capsule by mouth daily.   Yes Historical Provider, MD  clopidogrel (PLAVIX) 75 MG tablet Take 75 mg by mouth every morning.    Yes Historical Provider, MD  dextromethorphan (DELSYM) 30 MG/5ML liquid Take 2.5 mLs (15 mg total) by mouth at bedtime as needed for cough. 11/12/14  Yes Christina P Rama, MD  docusate sodium (COLACE) 100 MG capsule Take 100 mg by mouth daily.    Yes Historical Provider, MD  famotidine (PEPCID) 20 MG tablet Take 20 mg by mouth at bedtime.   Yes Historical Provider, MD  furosemide (LASIX) 20 MG tablet Take 20 mg by mouth See admin instructions. Take 1 tablet by mouth on Monday, Wednesday, Friday, and Saturday.   Yes Historical Provider, MD  HYDROcodone-acetaminophen (NORCO/VICODIN) 5-325 MG tablet Take 1 tablet by mouth every 6 (six) hours as needed for moderate pain.   Yes Historical Provider, MD  ipratropium (ATROVENT) 0.02 % nebulizer solution Take 0.5 mg by nebulization every 6 (six) hours as needed for wheezing or shortness of breath.    Yes Historical Provider, MD  ipratropium (ATROVENT) 0.03 % nasal spray Place 2 sprays into the nose 3 (three) times daily as needed for rhinitis. 01/12/16  Yes Coralyn Helling, MD  Loratadine 10 MG CAPS Take 1 capsule by mouth daily.   Yes Historical Provider, MD  losartan (COZAAR) 25 MG tablet Take 12.5 mg by mouth daily.   Yes Historical Provider, MD  Melatonin 1 MG TABS Take 1 mg by mouth at bedtime.   Yes Historical Provider, MD  metoprolol tartrate (LOPRESSOR) 25 MG tablet Take 25 mg by mouth 2 (two) times daily.   Yes Historical Provider, MD  mirtazapine (REMERON) 7.5 MG tablet Take 7.5 mg by mouth at bedtime.   Yes Historical Provider, MD  moxifloxacin (AVELOX) 400 MG tablet Take 400 mg by mouth daily at 8 pm.   Yes Historical Provider, MD  nitroGLYCERIN (NITRODUR - DOSED IN MG/24 HR) 0.2 mg/hr patch Place 0.2 mg onto the skin daily.    Yes Historical Provider, MD  Nutritional Supplements (NUTRITIONAL SHAKE PO) Take 1 Container by mouth 3 (three) times daily. Health Shakes TID   Yes Historical Provider, MD  PEG 3350-KCl-Na Bicarb-NaCl (TRILYTE PO) Take 8 oz by mouth every Monday. Drink 8 oz glass of Trilyte bowel prep every 15-30 minutes until 1/4 solution is gone every monday   Yes Historical Provider, MD  polyethylene glycol (MIRALAX / GLYCOLAX) packet Take 17 g by mouth daily as needed for moderate constipation.   Yes Historical Provider, MD  pravastatin (PRAVACHOL) 10 MG tablet Take 10 mg by mouth at bedtime.    Yes Historical Provider, MD  Probiotic Product (ALIGN) 4 MG CAPS Take 1 capsule by mouth daily.   Yes Historical Provider, MD  senna (SENOKOT) 8.6 MG tablet Take 1 tablet by mouth as needed for constipation.  Yes Historical Provider, MD  tiZANidine (ZANAFLEX) 4 MG tablet Take 4 mg by mouth 2 (two) times daily.   Yes Historical Provider, MD  traMADol (ULTRAM) 50 MG tablet Take 50 mg by mouth every 8 (eight) hours as needed for moderate pain.   Yes Historical Provider, MD  amLODipine (NORVASC) 5 MG tablet Take 1 tablet (5 mg total) by mouth daily. Patient not taking: Reported on 01/23/2016 11/12/14   Maryruth Bun Rama, MD  azithromycin (ZITHROMAX) 250 MG tablet Take 250 mg by mouth daily. Reported on 01/23/2016    Historical Provider, MD  feeding supplement, ENSURE COMPLETE, (ENSURE COMPLETE) LIQD Take 237 mLs by mouth 2 (two) times daily between meals. Patient not taking: Reported on 01/23/2016 11/12/14   Maryruth Bun Rama, MD  nitroGLYCERIN (NITROSTAT) 0.4 MG SL tablet Place 1 tablet (0.4 mg total) under the tongue every 5 (five) minutes as needed for chest pain (for break through chest pain in addition to nitro patch). 01/28/13   Vesta Mixer, MD   BP 100/49 mmHg  Pulse 62  Temp(Src) 97.6 F (36.4 C) (Oral)  Resp 16  SpO2 92% Physical Exam  Constitutional: She is oriented to person, place, and time. She appears  well-developed and well-nourished. No distress.  Laying in bed. Cachectic and frail appearing but generally seems comfortable.   HENT:  Head: Normocephalic and atraumatic.  Dry mucus membranes. Palatal tori. No concerning lesions noted.   Eyes: Conjunctivae are normal. Right eye exhibits no discharge. Left eye exhibits no discharge.  Neck: Neck supple.  Cardiovascular: Normal rate, regular rhythm and normal heart sounds.  Exam reveals no gallop and no friction rub.   No murmur heard. Pulmonary/Chest: Effort normal and breath sounds normal. No respiratory distress.  Abdominal: Soft. She exhibits no distension. There is no tenderness.  Musculoskeletal: She exhibits no edema or tenderness.  Neurological: She is alert and oriented to person, place, and time. No cranial nerve deficit. She exhibits normal muscle tone. Coordination normal.  Hard of hearing, otherwise CN 2-12 intact.   Skin: Skin is warm and dry.  Psychiatric: She has a normal mood and affect. Her behavior is normal. Thought content normal.  Nursing note and vitals reviewed.   ED Course  Procedures (including critical care time) Labs Review Labs Reviewed  CBC WITH DIFFERENTIAL/PLATELET - Abnormal; Notable for the following:    WBC 10.9 (*)    Hemoglobin 11.5 (*)    Neutro Abs 8.7 (*)    All other components within normal limits  BASIC METABOLIC PANEL - Abnormal; Notable for the following:    Chloride 96 (*)    BUN 49 (*)    Creatinine, Ser 2.49 (*)    Calcium 8.6 (*)    GFR calc non Af Amer 16 (*)    GFR calc Af Amer 18 (*)    Anion gap 16 (*)    All other components within normal limits  TROPONIN I  URINALYSIS, ROUTINE W REFLEX MICROSCOPIC (NOT AT [facility])    Imaging Review Dg Chest 2 View  01/23/2016  CLINICAL DATA:  Cough, pneumonia EXAM: CHEST  2 VIEW COMPARISON:  01/12/2016 FINDINGS: Cardiomediastinal silhouette is stable. There is hazy right base laterally atelectasis or infiltrate. Hyperinflation again noted.  No pulmonary edema. Osteopenia and mild degenerative changes thoracic spine. IMPRESSION: Hazy right base laterally atelectasis or infiltrate. Hyperinflation. No pulmonary edema. Osteopenia and mild degenerative changes thoracic spine. Electronically Signed   By: Natasha Mead M.D.   On: 01/23/2016 16:28  I have personally reviewed and evaluated these images and lab results as part of my medical decision-making.   EKG Interpretation   Date/Time:  Monday January 23 2016 16:45:39 EST Ventricular Rate:  75 PR Interval:  161 QRS Duration: 96 QT Interval:  477 QTC Calculation: 533 R Axis:   47 Text Interpretation:  Sinus rhythm Atrial premature complexes Left  ventricular hypertrophy Abnormal T, consider ischemia, lateral leads  Confirmed by Juleen China  MD, Haidan Nhan ([id number]) on 01/23/2016 5:56:47 PM      MDM   Final diagnoses:  Dehydration  CAP (community acquired pneumonia)  AKI (acute kidney injury) (HCC)    82 year old female with generalized weakness/fatigue. I suspect that this is mostly deconditioning and some degree of dehydration in setting of acute illness on top of advanced age and slight body habitus. She is hard of hearing but strikes me as generally pretty sharp for her age. She does not seem distressed. o2 sats occasionally down as low as low 80s on 2L [place] which is baesline requirement. She does not have increased WOB though. Borderline low BP. At this point my suspicion for serious infection, significant metabolic derangement or other emergent process is fairly low though.Will give some IVF and check basic blood work/UA. UTI less likely currently being on avelox.   BMP consistent with dehydration with BUN of 49 and Cr 2.49. Last labs about a year ago but Cr was 0.88 then. Pt only 37 kg. Given 500c bolus. Conttinue gentle IVF. CXR with opacity R base, atelectasis versus infiltrate. Currently on avelox for pneumonia. o2 sats occasionally dropping into 80s. Will discuss with medicine for  admisison.    Raeford Razor, MD 01/23/16 [phone]

## 2016-01-23 NOTE — ED Notes (Signed)
PT CAN GO UP AT 18:47.

## 2016-01-23 NOTE — Progress Notes (Signed)
PHARMACIST - PHYSICIAN ORDER COMMUNICATION  CONCERNING: P&T Medication Policy on Herbal Medications  DESCRIPTION:  This patient's order for:  melatonin  has been noted.  This product(s) is classified as an "herbal" or natural product. Due to a lack of definitive safety studies or FDA approval, nonstandard manufacturing practices, plus the potential risk of unknown drug-drug interactions while on inpatient medications, the Pharmacy and Therapeutics Committee does not permit the use of "herbal" or natural products of this type within [facility]   ACTION TAKEN: The pharmacy department is unable to verify this order at this time and your patient has been informed of this safety policy. Please reevaluate patient's clinical condition at discharge and address if the herbal or natural product(s) should be resumed at that time.   Arley Phenix RPh 01/23/2016, 7:23 PM Pager [phone]

## 2016-01-23 NOTE — ED Notes (Signed)
Pt from [place] on [place].  C/O abd pain x 2 days.  Large BM this am with relief of pain.  Family concerned about failure to thrive but pt up without assistance walking around her room at [facility] and [facility] x 4 though she does have Dementia Dx.  112/68BP 76P 18RR 96% 2L via [place] (chronic for COPD)  107 CBG.  Pt is finishing oral antibiotics for PNA-will finish 01/25/16  Z pac finished tomorrow

## 2016-01-23 NOTE — ED Notes (Signed)
Nurse starting IV and drawing labs. 

## 2016-01-24 LAB — BASIC METABOLIC PANEL
Anion gap: 8 (ref 5–15)
BUN: 43 mg/dL — AB (ref 6–20)
CALCIUM: 8.2 mg/dL — AB (ref 8.9–10.3)
CHLORIDE: 104 mmol/L (ref 101–111)
CO2: 31 mmol/L (ref 22–32)
CREATININE: 2.08 mg/dL — AB (ref 0.44–1.00)
GFR, EST AFRICAN AMERICAN: 23 mL/min — AB (ref >60)
GFR, EST NON AFRICAN AMERICAN: 20 mL/min — AB (ref >60)
Glucose, Bld: 91 mg/dL (ref 65–99)
Potassium: 4.4 mmol/L (ref 3.5–5.1)
SODIUM: 143 mmol/L (ref 135–145)

## 2016-01-24 LAB — CBC
HCT: 33.3 % — ABNORMAL LOW (ref 36.0–46.0)
Hemoglobin: 10.3 g/dL — ABNORMAL LOW (ref 12.0–15.0)
MCH: 29.3 pg (ref 26.0–34.0)
MCHC: 30.9 g/dL (ref 30.0–36.0)
MCV: 94.9 fL (ref 78.0–100.0)
PLATELETS: 227 10*3/uL (ref 150–400)
RBC: 3.51 MIL/uL — AB (ref 3.87–5.11)
RDW: 13.3 % (ref 11.5–15.5)
WBC: 7.6 10*3/uL (ref 4.0–10.5)

## 2016-01-24 MED ORDER — ENSURE ENLIVE PO LIQD: 237.0000 mL | Freq: Two times a day (BID) | ORAL | Status: DC | PRN

## 2016-01-24 NOTE — Progress Notes (Signed)
Initial Nutrition Assessment  DOCUMENTATION CODES:   Severe malnutrition in context of acute illness/injury, Underweight  INTERVENTION:   Provide Healthshakes TID, each provides 200 kcal and 6g protein Provide Ensure Enlive po PRN, each supplement provides 350 kcal and 20 grams of protein RD to continue to monitor  NUTRITION DIAGNOSIS:   Malnutrition related to acute illness as evidenced by energy intake < or equal to 50% for > or equal to 5 days, moderate depletion of body fat, severe depletion of muscle mass.  GOAL:   Patient will meet greater than or equal to 90% of their needs  MONITOR:   PO intake, Supplement acceptance, Labs, Weight trends, I & O's  REASON FOR ASSESSMENT:   Malnutrition Screening Tool    ASSESSMENT:   82 year old female with past medical history of CAD (on aspirin and plavix), hypertension, dyslipidemia, chronic combined CHF ( 2 D ECHO in 201 with EF 35-40% and grade 1 DD), COPD on oxygen who presented to [facility] ED with weakness and fatigue and poor po intake over past few days prior to this admission.   Patient asleep in room with no family present. Pt did not waken for very long during RD visit. Pt has had poor PO intake for the past couple of days PTA. Pt now on heart healthy diet with no PO documented. Pt with history of severe malnutrition with slow progressive weight loss over the last 3-4 years per chart review. Over 2016 she weighted between 83-85 lb. Based on medication list, pt drinks Healthshakes TID. RD to order Ensure PRN as this would provide more kcal and protein.  Moderate fat and severe muscle depletion noted.   Labs reviewed: Elevated BUN & Creatinine  Diet Order:  Diet Heart Room service appropriate?: Yes; Fluid consistency:: Thin  Skin:  Reviewed, no issues  Last BM:  1/30  Height:   Ht Readings from Last 1 Encounters:  01/23/16 5' (1.524 m)    Weight:   Wt Readings from Last 1 Encounters:  01/23/16 83 lb (37.649 kg)     Ideal Body Weight:  45.5 kg  BMI:  Body mass index is 16.21 kg/(m^2).  Estimated Nutritional Needs:   Kcal:  1200-1400  Protein:  60-70g  Fluid:  1.5L/day  EDUCATION NEEDS:   No education needs identified at this time  Tilda Franco, MS, RD, LDN Pager: [phone] After Hours Pager: [phone]

## 2016-01-24 NOTE — Progress Notes (Signed)
IV fluids discontinued per MD, Elisabeth Pigeon via telephone with read back.

## 2016-01-24 NOTE — Evaluation (Signed)
Physical Therapy Evaluation Patient Details Name: [name] MRN: [id number] DOB: Sep 03, 1922 Today's Date: 01/24/2016   History of Present Illness  82 year old female with past medical history of CAD (on aspirin and plavix), hypertension, dyslipidemia, chronic combined CHF ( 2 D ECHO in 201 with EF 35-40% and grade 1 DD), COPD on oxygen who presented to [facility] ED with weakness and fatigue and poor po intake over past few days prior to this admission.   Clinical Impression  Patient presents with decreased mobility due to deficits listed in PT problem list.  She will benefit from skilled PT in the acute setting to allow return to ALF with follow up HHPT.  Will need to use walker at d/c due to weakness.    Follow Up Recommendations Home health PT    Equipment Recommendations  None recommended by PT    Recommendations for Other Services       Precautions / Restrictions Precautions Precautions: Fall      Mobility  Bed Mobility Overal bed mobility: Needs Assistance Bed Mobility: Supine to Sit;Sit to Supine     Supine to sit: Modified independent (Device/Increase time) Sit to supine: Supervision   General bed mobility comments: assist for safety with IV, O2  Transfers Overall transfer level: Needs assistance Equipment used: Rolling walker (2 wheeled) Transfers: Sit to/from Stand Sit to Stand: Min guard         General transfer comment: initial posterior bias  Ambulation/Gait Ambulation/Gait assistance: Min guard Ambulation Distance (Feet): 140 Feet Assistive device: Rolling walker (2 wheeled) Gait Pattern/deviations: Step-through pattern;Shuffle;Leaning posteriorly;Decreased stride length     General Gait Details: light CGA on back for stability, cues for safety with walker on turning (used to no device, versus 4 wheeled walker) in room no device min A  Stairs            Wheelchair Mobility    Modified Rankin (Stroke Patients Only)       Balance Overall  balance assessment: Needs assistance Sitting-balance support: Feet unsupported Sitting balance-Leahy Scale: Good Sitting balance - Comments: leans for perineal hygiene on too tall BSC Postural control: Posterior lean Standing balance support: Bilateral upper extremity supported Standing balance-Leahy Scale: Poor Standing balance comment: bracing against bed initially in standing due to posterior bias, reports LE weakness                             Pertinent Vitals/Pain Pain Assessment: No/denies pain    Home Living Family/patient expects to be discharged to:: Assisted living Living Arrangements: Alone             Home Equipment: Walker - 2 wheels;Walker - 4 wheels Additional Comments: [facility]    Prior Function                 Hand Dominance   Dominant Hand: Right    Extremity/Trunk Assessment               Lower Extremity Assessment: Generalized weakness         Communication   Communication: HOH  Cognition Arousal/Alertness: Awake/alert Behavior During Therapy: WFL for tasks assessed/performed Overall Cognitive Status: No family/caregiver present to determine baseline cognitive functioning       Memory: Decreased short-term memory              General Comments      Exercises        Assessment/Plan    PT Assessment Patient needs  continued PT services  PT Diagnosis Generalized weakness;Abnormality of gait   PT Problem List Decreased strength;Decreased balance;Decreased mobility;Decreased safety awareness;Decreased knowledge of use of DME;Decreased activity tolerance  PT Treatment Interventions DME instruction;Gait training;Functional mobility training;Therapeutic activities;Therapeutic exercise;Patient/family education;Balance training   PT Goals (Current goals can be found in the Care Plan section) Acute Rehab PT Goals Patient Stated Goal: To get stronger PT Goal Formulation: With patient Time For Goal  Achievement: 01/31/16 Potential to Achieve Goals: Good    Frequency Min 3X/week   Barriers to discharge        Co-evaluation               End of Session Equipment Utilized During Treatment: Oxygen Activity Tolerance: Patient limited by fatigue Patient left: in bed;with call bell/phone within reach;with bed alarm set           Time: 1554-1620 PT Time Calculation (min) (ACUTE ONLY): 26 min   Charges:   PT Evaluation $PT Eval Moderate Complexity: 1 Procedure PT Treatments $Gait Training: 8-22 mins   PT G CodesElray Mcgregor 02-07-16, 4:32 PM  Sheran Lawless, PT [phone] 2016/02/07

## 2016-01-24 NOTE — Progress Notes (Signed)
Patient ID: [name], female   DOB: 24-Jun-1922, 82 y.o.   MRN: [id number] [facility] PROGRESS NOTE  Tanasha Menees WUJ:[id number] DOB: [birth year] DOA: 01/23/2016 PCP: [name], MD  Brief narrative:    82 year old female with past medical history of CAD (on aspirin and plavix), hypertension, dyslipidemia, chronic combined CHF ( 2 D ECHO in 201 with EF 35-40% and grade 1 DD), COPD on oxygen who presented to [facility] ED with weakness and fatigue and poor po intake over past few days prior to this admission. She was recently seen in pulmonary office and given azithromycin and prednisone.  In ED, BP was 100/49, HR 62, R 16. Oxygen saturation was 90-96% with [place] oxygen support. Blood work showed WBC count 10.9, hemoglobin 11.5, creatinine 2.49 (baseline 1.5). She was given Levaquin in ED. She was admitted for further management of dehydration, weakness, pneumonia and worsening renal failure.  Assessment/Plan:    Principal Problem:  Chronic respiratory failure with hypoxia (HCC) / COPD GOLD III - Respiratory status remains stable - Continue oxygen support via  to keep O2 sats above 90%  Active Problems:  Lobar pneumonia, unspecified organism (HCC) / Leukocytosis - Patient was on Avelox prior to admission. We started Levaquin at the time of the admission - Blood cultures, respiratory culture, Legionella and strep pneumonia are all pending - Leukocytosis resolved   Chronic combined systolic and diastolic heart failure (HCC) - 2 D ECHO in 2016 F 35-40% with grade 1 diastolic dysfunction - Stop IV fluids today    CAD - Continue aspirin and plavix    Protein-calorie malnutrition, severe (HCC) - Diet as tolerated    Acute renal failure superimposed on stage 4 chronic kidney disease (HCC) / Dehydration - Baseline Cr 1.5 in 10/2014 and on this admission 2.5 - Likely from lasix and dehydration - Creatinine is 2.08 this morning  Anemia of chronic disease - Secondary to chronic  kidney disease - Hemoglobin 10.3 today   Yeast UTI - Started fluconazole - Urine culture pending   Benign essential HTN - Continue Norvasc, metoprolol, losartan   Dyslipidemia - Continue Pravachol   DVT prophylaxis:  - SCD's bilaterally   Code Status: Full.  Family Communication:  plan of care discussed with the patient Disposition Plan: home likely by 2/2.  IV access:  Peripheral IV  Procedures and diagnostic studies:    Dg Chest 2 View 01/23/2016  Hazy right base laterally atelectasis or infiltrate. Hyperinflation. No pulmonary edema. Osteopenia and mild degenerative changes thoracic spine.   Medical Consultants:  None   Other Consultants:  PT  IAnti-Infectives:   Levaquin 01/23/2016 --> Fluconazole 01/24/2016 -->  Manson Passey, MD  [facility] Pager [phone]  Time spent in minutes: 25 minutes  If 7PM-7AM, please contact night-coverage [email or web address] Password [email or web address] 01/24/2016, 11:24 AM   LOS: 1 day    HPI/Subjective: No acute overnight events. Patient reports she slept good.   Objective: Filed Vitals:   01/23/16 1904 01/23/16 2124 01/23/16 2231 01/24/16 0601  BP: 129/89 84/68  122/40  Pulse: 84 74  68  Temp: 98 F (36.7 C) 97.2 F (36.2 C)  97.6 F (36.4 C)  TempSrc:  Oral  Oral  Resp: Height: 5' (1.524 m)     Weight: 83 lb (37.649 kg)     SpO2: 96% 91% 90% 91%    Intake/Output Summary (Last 24 hours) at 01/24/16 1124 Last data filed at 01/24/16 1034  Gross per 24 hour  Intake 1208.75 ml  Output   1150 ml  Net  58.75 ml    Exam:   General:  Pt is alert, follows commands appropriately, not in acute distress  Cardiovascular: Regular rate and rhythm, S1/S2 (+)  Respiratory:  no wheezing, no crackles, no rhonchi  Abdomen: Soft, non tender, non distended, bowel sounds present  Extremities: No edema, pulses DP and PT palpable bilaterally  Neuro: Grossly nonfocal  Data Reviewed: Basic Metabolic Panel:  Recent  Labs Lab 01/23/16 1555 01/24/16 0510  NA 136 143  K 4.4 4.4  CL 96* 104  CO2 24 31  GLUCOSE 91 91  BUN 49* 43*  CREATININE 2.49* 2.08*  CALCIUM 8.6* 8.2*   Liver Function Tests: No results for input(s): AST, ALT, ALKPHOS, BILITOT, PROT, ALBUMIN in the last 168 hours. No results for input(s): LIPASE, AMYLASE in the last 168 hours. No results for input(s): AMMONIA in the last 168 hours. CBC:  Recent Labs Lab 01/23/16 1555 01/24/16 0510  WBC 10.9* 7.6  NEUTROABS 8.7*  --   HGB 11.5* 10.3*  HCT 36.4 33.3*  MCV 90.8 94.9  PLT 243 227   Cardiac Enzymes:  Recent Labs Lab 01/23/16 1555  TROPONINI <0.03   BNP: Invalid input(s): POCBNP CBG: No results for input(s): GLUCAP in the last 168 hours.  Recent Results (from the past 240 hour(s))  Culture, blood (routine x 2) Call MD if unable to obtain prior to antibiotics being given     Status: None (Preliminary result)   Collection Time: 01/23/16  7:45 PM  Result Value Ref Range Status   Specimen Description BLOOD LEFT ARM  Final   Special Requests   Final    BOTTLES DRAWN AEROBIC AND ANAEROBIC 10CC Performed at [facility]    Culture PENDING  Incomplete   Report Status PENDING  Incomplete     Scheduled Meds: . acidophilus  1 capsule Oral Daily  . albuterol  2.5 mg Nebulization BID  . amLODipine  2.5 mg Oral Daily  . aspirin  81 mg Oral q morning - 10a  . cholecalciferol  400 Units Oral Daily  . clopidogrel  75 mg Oral q morning - 10a  . docusate sodium  100 mg Oral Daily  . famotidine  20 mg Oral QHS  . fluconazole  100 mg Oral Q48H  . [START ON 01/25/2016] levofloxacin (LEVAQUIN) IV  500 mg Intravenous Q48H  . loratadine  10 mg Oral Daily  . losartan  12.5 mg Oral Daily  . metoprolol tartrate  25 mg Oral BID  . mirtazapine  7.5 mg Oral QHS  . polyvinyl alcohol  1 drop Both Eyes QID  . pravastatin  10 mg Oral QHS  . tiZANidine  4 mg Oral BID   Continuous Infusions: . sodium chloride

## 2016-01-25 LAB — URINE CULTURE: Culture: 1000

## 2016-01-25 LAB — EXPECTORATED SPUTUM ASSESSMENT W GRAM STAIN, RFLX TO RESP C

## 2016-01-25 LAB — EXPECTORATED SPUTUM ASSESSMENT W REFEX TO RESP CULTURE

## 2016-01-25 LAB — STREP PNEUMONIAE URINARY ANTIGEN: Strep Pneumo Urinary Antigen: NEGATIVE

## 2016-01-25 MED ORDER — LIP MEDEX EX OINT
TOPICAL_OINTMENT | CUTANEOUS | Status: AC
  Administered 2016-01-25: 16:00:00
  Filled 2016-01-25: qty 7

## 2016-01-25 MED ORDER — MAGIC MOUTHWASH W/LIDOCAINE
5.0000 mL | Freq: Three times a day (TID) | ORAL | Status: DC
  Administered 2016-01-25 – 2016-01-26 (×3): 5 mL via ORAL
  Filled 2016-01-25 (×6): qty 5

## 2016-01-25 NOTE — Progress Notes (Signed)
Patient ID: [name], female   DOB: 26-Jan-1922, 82 y.o.   MRN: [id number] [facility] PROGRESS NOTE  [name] WUJ:[id number] DOB: 1922-01-15 DOA: 01/23/2016 PCP: [name], MD  Brief narrative:    82 year old female with past medical history of CAD (on aspirin and plavix), hypertension, dyslipidemia, chronic combined CHF ( 2 D ECHO in 201 with EF 35-40% and grade 1 DD), COPD on oxygen who presented to [facility] ED with weakness and fatigue and poor po intake over past few days prior to this admission. She was recently seen in pulmonary office and given azithromycin and prednisone.  In ED, BP was 100/49, HR 62, R 16. Oxygen saturation was 90-96% with [place] oxygen support. Blood work showed WBC count 10.9, hemoglobin 11.5, creatinine 2.49 (baseline 1.5). She was given Levaquin in ED. She was admitted for further management of dehydration, weakness, pneumonia and worsening renal failure.  Assessment/Plan:    Principal Problem:  Chronic respiratory failure with hypoxia (HCC) / COPD GOLD III - Stable  - Continue oxygen support via [place] to keep O2 sats above 90%  Active Problems:  Lobar pneumonia, unspecified organism (HCC) / Leukocytosis - Patient was on Avelox prior to admission. We started Levaquin at the time of the admission - Blood cultures, respiratory culture - no growth so far  - Legionella and strep pneumonia - pending as of 01/25/2016 - Leukocytosis resolved   Chronic combined systolic and diastolic heart failure (HCC) - 2 D ECHO in 2016 F 35-40% with grade 1 diastolic dysfunction - Compensated    CAD - Continue aspirin and plavix    Protein-calorie malnutrition, severe (HCC) - Diet as tolerated    Acute renal failure superimposed on stage 4 chronic kidney disease (HCC) / Dehydration - Baseline Cr 1.5 in 10/2014 and on this admission 2.5 - Likely from lasix and dehydration - Creatinine is 2.08 this morning - Check BMP in am  Anemia of chronic disease - Secondary  to chronic kidney disease - Hemoglobin stable    Yeast UTI - Continue fluconazole - Urine culture pending    Benign essential HTN - Continue Norvasc, metoprolol, losartan   Dyslipidemia - Continue Pravachol   DVT prophylaxis:  - SCD's bilaterally in hospital   Code Status: Full.  Family Communication:  plan of care discussed with the patient and her daughter (Cell# [phone]) Disposition Plan: home likely by 2/2.  IV access:  Peripheral IV  Procedures and diagnostic studies:    Dg Chest 2 View 01/23/2016  Hazy right base laterally atelectasis or infiltrate. Hyperinflation. No pulmonary edema. Osteopenia and mild degenerative changes thoracic spine.   Medical Consultants:  None   Other Consultants:  PT  IAnti-Infectives:   Levaquin 01/23/2016 --> Fluconazole 01/24/2016 -->   Manson Passey, MD  [facility] Pager [phone]  Time spent in minutes: 25 minutes  If 7PM-7AM, please contact night-coverage [email or web address] Password [facility] 01/25/2016, 10:58 AM   LOS: 2 days    HPI/Subjective: No acute overnight events. Patient reports no respiratory distress.   Objective: Filed Vitals:   01/24/16 2042 01/24/16 2141 01/25/16 0626 01/25/16 1044  BP:  135/63 153/67   Pulse:  45 68   Temp:  98.5 F (36.9 C) 97.7 F (36.5 C)   TempSrc:  Oral Axillary   Resp:  16 14   Height:      Weight:      SpO2: 92% 94% 99% 94%    Intake/Output Summary (Last 24 hours) at 01/25/16 1058 Last data filed at 01/25/16  1000  Gross per 24 hour  Intake    120 ml  Output   1300 ml  Net  -1180 ml    Exam:   General:  Pt is alert, not in acute distress  Cardiovascular: RRR, S1/S2 appreciated   Respiratory:  Bilateral air entry, no wheezing   Abdomen: (+) BS, non tender   Extremities: No swelling, palpable pulses   Neuro: Nonfocal  Data Reviewed: Basic Metabolic Panel:  Recent Labs Lab 01/23/16 1555 01/24/16 0510  NA 136 143  K 4.4 4.4  CL 96* 104  CO2 24  31  GLUCOSE 91 91  BUN 49* 43*  CREATININE 2.49* 2.08*  CALCIUM 8.6* 8.2*   Liver Function Tests: No results for input(s): AST, ALT, ALKPHOS, BILITOT, PROT, ALBUMIN in the last 168 hours. No results for input(s): LIPASE, AMYLASE in the last 168 hours. No results for input(s): AMMONIA in the last 168 hours. CBC:  Recent Labs Lab 01/23/16 1555 01/24/16 0510  WBC 10.9* 7.6  NEUTROABS 8.7*  --   HGB 11.5* 10.3*  HCT 36.4 33.3*  MCV 90.8 94.9  PLT 243 227   Cardiac Enzymes:  Recent Labs Lab 01/23/16 1555  TROPONINI <0.03   BNP: Invalid input(s): POCBNP CBG: No results for input(s): GLUCAP in the last 168 hours.  Recent Results (from the past 240 hour(s))  Culture, blood (routine x 2) Call MD if unable to obtain prior to antibiotics being given     Status: None (Preliminary result)   Collection Time: 01/23/16  7:45 PM  Result Value Ref Range Status   Specimen Description BLOOD LEFT ARM  Final   Special Requests   Final    BOTTLES DRAWN AEROBIC AND ANAEROBIC 10CC Performed at [facility]    Culture PENDING  Incomplete   Report Status PENDING  Incomplete  Culture, blood (routine x 2) Call MD if unable to obtain prior to antibiotics being given     Status: None (Preliminary result)   Collection Time: 01/23/16  7:55 PM  Result Value Ref Range Status   Specimen Description BLOOD LEFT HAND  Final   Special Requests IN PEDIATRIC BOTTLE  2CC  Final   Culture   Final    NO GROWTH < 24 HOURS Performed at [facility]    Report Status PENDING  Incomplete     Scheduled Meds: . acidophilus  1 capsule Oral Daily  . albuterol  2.5 mg Nebulization BID  . amLODipine  2.5 mg Oral Daily  . aspirin  81 mg Oral q morning - 10a  . cholecalciferol  400 Units Oral Daily  . clopidogrel  75 mg Oral q morning - 10a  . docusate sodium  100 mg Oral Daily  . famotidine  20 mg Oral QHS  . fluconazole  100 mg Oral Q48H  . levofloxacin (LEVAQUIN) IV  500 mg Intravenous  Q48H  . loratadine  10 mg Oral Daily  . losartan  12.5 mg Oral Daily  . metoprolol tartrate  25 mg Oral BID  . mirtazapine  7.5 mg Oral QHS  . polyvinyl alcohol  1 drop Both Eyes QID  . pravastatin  10 mg Oral QHS  . tiZANidine  4 mg Oral BID   Continuous Infusions:

## 2016-01-25 NOTE — Clinical Social Work Note (Addendum)
CSW spoke with staff at [facility] and confirmed that pt is a LTC resident. Pt is able to return once medically stable but will need to be evaluated by a [facility] RN prior to returning to the facility. Pt will also need an updated FL2. PT is recommending HH- RN CM is aware. CSW will continue to follow for any d/c needs.    Etta Quill, LCSW [phone] [facility] 5E, 5W 31-35 Licensed Clinical Social Worker

## 2016-01-26 LAB — BASIC METABOLIC PANEL
ANION GAP: 6 (ref 5–15)
BUN: 14 mg/dL (ref 6–20)
CHLORIDE: 104 mmol/L (ref 101–111)
CO2: 33 mmol/L — AB (ref 22–32)
Calcium: 8.4 mg/dL — ABNORMAL LOW (ref 8.9–10.3)
Creatinine, Ser: 1.04 mg/dL — ABNORMAL HIGH (ref 0.44–1.00)
GFR calc non Af Amer: 46 mL/min — ABNORMAL LOW (ref >60)
GFR, EST AFRICAN AMERICAN: 53 mL/min — AB (ref >60)
Glucose, Bld: 89 mg/dL (ref 65–99)
Potassium: 4.2 mmol/L (ref 3.5–5.1)
SODIUM: 143 mmol/L (ref 135–145)

## 2016-01-26 LAB — LEGIONELLA ANTIGEN, URINE

## 2016-01-26 MED ORDER — FLUCONAZOLE 100 MG PO TABS: 100.0000 mg | ORAL_TABLET | ORAL | Status: DC

## 2016-01-26 MED ORDER — MAGIC MOUTHWASH W/LIDOCAINE: 5.0000 mL | Freq: Three times a day (TID) | ORAL | Status: DC

## 2016-01-26 MED ORDER — ENSURE ENLIVE PO LIQD: 237.0000 mL | Freq: Two times a day (BID) | ORAL | Status: DC | PRN

## 2016-01-26 MED ORDER — LEVOFLOXACIN 500 MG PO TABS: 500.0000 mg | ORAL_TABLET | ORAL | Status: DC

## 2016-01-26 NOTE — Discharge Summary (Signed)
Physician Discharge Summary  [name] XLK:[id number] DOB: 1922/11/28 DOA: 01/23/2016  PCP: [name], MD  Admit date: 01/23/2016 Discharge date: 01/26/2016  Recommendations for Outpatient Follow-up:  Patient will continue fluconazole every 48 hours for 4 more doses of discharge for yeast UTI. She will continue Levaquin for 2 more doses every 48 hours for pneumonia.  Discharge Diagnoses:  Principal Problem:   Chronic respiratory failure with hypoxia (HCC) Active Problems:   Lobar pneumonia, unspecified organism (HCC)   Leukocytosis   COPD GOLD III   Chronic combined systolic and diastolic heart failure (HCC)   Protein-calorie malnutrition, severe (HCC)   Dehydration   Acute renal failure superimposed on stage 4 chronic kidney disease (HCC)   Yeast UTI   Benign essential HTN   Dyslipidemia    Discharge Condition: stable   Diet recommendation: as tolerated   History of present illness:  82 year old female with past medical history of CAD (on aspirin and plavix), hypertension, dyslipidemia, chronic combined CHF ( 2 D ECHO in 201 with EF 35-40% and grade 1 DD), COPD on oxygen who presented to [facility] ED with weakness and fatigue and poor po intake over past few days prior to this admission. She was recently seen in pulmonary office and given azithromycin and prednisone.  In ED, BP was 100/49, HR 62, R 16. Oxygen saturation was 90-96% with [place] oxygen support. Blood work showed WBC count 10.9, hemoglobin 11.5, creatinine 2.49 (baseline 1.5). She was given Levaquin in ED. She was admitted for further management of dehydration, weakness, pneumonia and worsening renal failure.  Hospital Course:   Assessment/Plan:    Principal Problem:  Chronic respiratory failure with hypoxia (HCC) / COPD GOLD III - Stable  - Continue oxygen support via [place] to keep O2 sats above 90%  Active Problems:  Lobar pneumonia, unspecified organism (HCC) / Leukocytosis - Patient was on Avelox prior to  admission. We started Levaquin at the time of the admission - Blood cultures, respiratory culture - no growth so far  - Legionella and strep pneumonia - pending as of 01/26/2016 - needs to be follow up with PCP on outpt basis but Levaquin will cover for it - Patient will continue Levaquin for 2 more doses on discharge. - Leukocytosis resolved   Chronic combined systolic and diastolic heart failure (HCC) - 2 D ECHO in 2016 F 35-40% with grade 1 diastolic dysfunction - Compensated  - We'll continue to hold Lasix until renal function remains within normal limits. -Creatinine is 1.04 this morning, much improved since the admission.    CAD - Continue aspirin and plavix    Protein-calorie malnutrition, severe (HCC) - Diet as tolerated    Acute renal failure superimposed on stage 4 chronic kidney disease (HCC) / Dehydration - Baseline Cr 1.5 in 10/2014 and on this admission 2.5 - Likely from lasix and dehydration - Creatinine is 1.04 today, much better since admission - Patient can continue to hold Lasix until renal function within normal limits and stays normal and then it can be determined by PCP when she can resume it.  Anemia of chronic disease - Secondary to chronic kidney disease - Hemoglobin stable    Yeast UTI - Continue fluconazole for 4 more doses on discharge    Benign essential HTN - Continue Norvasc, metoprolol, losartan   Dyslipidemia - Continue Pravachol   DVT prophylaxis:  - SCD's bilaterally  Code Status: Full.  Family Communication: plan of care discussed with the patient and her daughter (Cell# [phone])  IV access:  Peripheral IV  Procedures and diagnostic studies:   Dg Chest 2 View 01/23/2016 Hazy right base laterally atelectasis or infiltrate. Hyperinflation. No pulmonary edema. Osteopenia and mild degenerative changes thoracic spine.   Medical Consultants:  None   Other Consultants:  PT  Signed:  Manson Passey,  MD  [facility] 01/26/2016, 11:02 AM  Pager #: [phone]  Time spent in minutes: more than 30 minutes    Discharge Exam: Filed Vitals:   01/26/16 0525 01/26/16 1000  BP: 175/67 159/61  Pulse: 77   Temp: 98.5 F (36.9 C)   Resp: 16    Filed Vitals:   01/25/16 2012 01/26/16 0525 01/26/16 0912 01/26/16 1000  BP: 157/43 175/67  159/61  Pulse: 86 77    Temp: 98.9 F (37.2 C) 98.5 F (36.9 C)    TempSrc: Oral Oral    Resp: 20 16    Height:      Weight:      SpO2: 97% 95% 91%     General: Pt is alert, follows commands appropriately, not in acute distress Cardiovascular: Regular rate and rhythm, S1/S2 (+) Respiratory: no wheezing, no crackles, no rhonchi Abdominal: Soft, non tender, non distended, bowel sounds +, no guarding Extremities: no edema, no cyanosis, pulses palpable bilaterally DP and PT Neuro: Grossly nonfocal  Discharge Instructions  Discharge Instructions    Call MD for:  difficulty breathing, headache or visual disturbances    Complete by:  As directed      Call MD for:  persistant dizziness or light-headedness    Complete by:  As directed      Call MD for:  persistant nausea and vomiting    Complete by:  As directed      Call MD for:  severe uncontrolled pain    Complete by:  As directed      Diet - low sodium heart healthy    Complete by:  As directed      Increase activity slowly    Complete by:  As directed             Medication List    STOP taking these medications        azithromycin 250 MG tablet  Commonly known as:  ZITHROMAX     furosemide 20 MG tablet  Commonly known as:  LASIX     moxifloxacin 400 MG tablet  Commonly known as:  AVELOX     polyethylene glycol packet  Commonly known as:  MIRALAX / GLYCOLAX     traMADol 50 MG tablet  Commonly known as:  ULTRAM      TAKE these medications        acetaminophen 325 MG tablet  Commonly known as:  TYLENOL  Take 2 tablets (650 mg total) by mouth every 6 (six) hours as  needed for fever.     albuterol (2.5 MG/3ML) 0.083% nebulizer solution  Commonly known as:  PROVENTIL  Take 2.5 mg by nebulization 2 (two) times daily.     ALIGN 4 MG Caps  Take 1 capsule by mouth daily.     ALPRAZolam 0.5 MG tablet  Commonly known as:  XANAX  Take 0.5 mg by mouth as needed for anxiety or sleep.     amLODipine 5 MG tablet  Commonly known as:  NORVASC  Take 1 tablet (5 mg total) by mouth daily.     aspirin 81 MG chewable tablet  Chew 81 mg by mouth every morning.  Carboxymethylcellul-Glycerin 0.5-0.9 % Soln  Place 1 drop into both eyes 4 (four) times daily.     clopidogrel 75 MG tablet  Commonly known as:  PLAVIX  Take 75 mg by mouth every morning.     dextromethorphan 30 MG/5ML liquid  Commonly known as:  DELSYM  Take 2.5 mLs (15 mg total) by mouth at bedtime as needed for cough.     docusate sodium 100 MG capsule  Commonly known as:  COLACE  Take 100 mg by mouth daily.     famotidine 20 MG tablet  Commonly known as:  PEPCID  Take 20 mg by mouth at bedtime.     NUTRITIONAL SHAKE PO  Take 1 Container by mouth 3 (three) times daily. Health Shakes TID     feeding supplement (ENSURE COMPLETE) Liqd  Take 237 mLs by mouth 2 (two) times daily between meals.     feeding supplement (ENSURE ENLIVE) Liqd  Take 237 mLs by mouth 2 (two) times daily as needed (Question if pt likes supplement).     fluconazole 100 MG tablet  Commonly known as:  DIFLUCAN  Take 1 tablet (100 mg total) by mouth every other day.     HYDROcodone-acetaminophen 5-325 MG tablet  Commonly known as:  NORCO/VICODIN  Take 1 tablet by mouth every 6 (six) hours as needed for moderate pain.     ipratropium 0.02 % nebulizer solution  Commonly known as:  ATROVENT  Take 0.5 mg by nebulization every 6 (six) hours as needed for wheezing or shortness of breath.     ipratropium 0.03 % nasal spray  Commonly known as:  ATROVENT  Place 2 sprays into the nose 3 (three) times daily as needed  for rhinitis.     levofloxacin 500 MG tablet  Commonly known as:  LEVAQUIN  Take 1 tablet (500 mg total) by mouth every other day.     Loratadine 10 MG Caps  Take 1 capsule by mouth daily.     losartan 25 MG tablet  Commonly known as:  COZAAR  Take 12.5 mg by mouth daily.     Melatonin 1 MG Tabs  Take 1 mg by mouth at bedtime.     metoprolol tartrate 25 MG tablet  Commonly known as:  LOPRESSOR  Take 25 mg by mouth 2 (two) times daily.     mirtazapine 7.5 MG tablet  Commonly known as:  REMERON  Take 7.5 mg by mouth at bedtime.     nitroGLYCERIN 0.2 mg/hr patch  Commonly known as:  NITRODUR - Dosed in mg/24 hr  Place 0.2 mg onto the skin daily.     pravastatin 10 MG tablet  Commonly known as:  PRAVACHOL  Take 10 mg by mouth at bedtime.     senna 8.6 MG tablet  Commonly known as:  SENOKOT  Take 1 tablet by mouth as needed for constipation.     tiZANidine 4 MG tablet  Commonly known as:  ZANAFLEX  Take 4 mg by mouth 2 (two) times daily.     TRILYTE PO  Take 8 oz by mouth every Monday. Drink 8 oz glass of Trilyte bowel prep every 15-30 minutes until 1/4 solution is gone every monday     Vitamin D3 400 units Caps  Take 1 capsule by mouth daily.            Follow-up Information    Follow up with [name], MD. Schedule an appointment as soon as possible for a visit in 1  week.   Specialty:  Family Medicine   Why:  Follow up appt after recent hospitalization   Contact information:   [address] [place] [address] [phone]        The results of significant diagnostics from this hospitalization (including imaging, microbiology, ancillary and laboratory) are listed below for reference.    Significant Diagnostic Studies: Dg Chest 2 View  01/23/2016  CLINICAL DATA:  Cough, pneumonia EXAM: CHEST  2 VIEW COMPARISON:  01/12/2016 FINDINGS: Cardiomediastinal silhouette is stable. There is hazy right base laterally atelectasis or infiltrate.  Hyperinflation again noted. No pulmonary edema. Osteopenia and mild degenerative changes thoracic spine. IMPRESSION: Hazy right base laterally atelectasis or infiltrate. Hyperinflation. No pulmonary edema. Osteopenia and mild degenerative changes thoracic spine. Electronically Signed   By: Natasha Mead M.D.   On: 01/23/2016 16:28   Dg Chest 2 View  01/13/2016  CLINICAL DATA:  Chest congestion with cough, headaches and chills for 4 days. On antibiotics. EXAM: CHEST  2 VIEW COMPARISON:  11/01/2015 and 11/07/2014 radiographs.  CT 11/05/2012. FINDINGS: The heart size and mediastinal contours are stable. There is aortic atherosclerosis. Chronic changes of severe emphysema are again noted with grossly stable scarring at both lung bases. No superimposed airspace disease, enlarging nodule or significant pleural effusion identified. The bones appear unchanged. IMPRESSION: Stable chronic lung disease with emphysema and basilar scarring. No acute findings demonstrated. Electronically Signed   By: Carey Bullocks M.D.   On: 01/13/2016 09:30    Microbiology: Culture, blood (routine x 2)    Collection Time: 01/23/16  7:45 PM  Result Value Ref Range Status   Specimen Description BLOOD LEFT ARM  Final   Special Requests   Final    BOTTLES DRAWN AEROBIC AND ANAEROBIC 10CC Performed at [facility]    Culture PENDING  Incomplete   Report Status PENDING  Incomplete  Culture, blood (routine x 2)   Collection Time: 01/23/16  7:55 PM  Result Value Ref Range Status   Specimen Description BLOOD LEFT HAND  Final   Special Requests IN PEDIATRIC BOTTLE  2CC  Final   Culture   Final    NO GROWTH 2 DAYS Performed at [facility]    Report Status PENDING  Incomplete  Culture, Urine     Status: None   Collection Time: 01/24/16  5:39 PM  Result Value Ref Range Status   Specimen Description URINE, CLEAN CATCH  Final   Special Requests NONE  Final   Culture   Final    1,000 COLONIES/mL INSIGNIFICANT  GROWTH Performed at [facility]    Report Status 01/25/2016 FINAL  Final  Culture, sputum-assessment     Status: None   Collection Time: 01/25/16 12:42 PM  Result Value Ref Range Status   Specimen Description SPUTUM  Final   Special Requests NONE  Final   Sputum evaluation   Final    THIS SPECIMEN IS ACCEPTABLE. RESPIRATORY CULTURE REPORT TO FOLLOW.   Report Status 01/25/2016 FINAL  Final     Labs: Basic Metabolic Panel:  Recent Labs Lab 01/23/16 1555 01/24/16 0510 01/26/16 0428  NA 136 143 143  K 4.4 4.4 4.2  CL 96* 104 104  CO2 24 31 33*  GLUCOSE 91 91 89  BUN 49* 43* 14  CREATININE 2.49* 2.08* 1.04*  CALCIUM 8.6* 8.2* 8.4*   Liver Function Tests: No results for input(s): AST, ALT, ALKPHOS, BILITOT, PROT, ALBUMIN in the last 168 hours. No results  for input(s): LIPASE, AMYLASE in the last 168 hours. No results for input(s): AMMONIA in the last 168 hours. CBC:  Recent Labs Lab 01/23/16 1555 01/24/16 0510  WBC 10.9* 7.6  NEUTROABS 8.7*  --   HGB 11.5* 10.3*  HCT 36.4 33.3*  MCV 90.8 94.9  PLT 243 227   Cardiac Enzymes:  Recent Labs Lab 01/23/16 1555  TROPONINI <0.03   BNP: BNP (last 3 results) No results for input(s): BNP in the last 8760 hours.  ProBNP (last 3 results) No results for input(s): PROBNP in the last 8760 hours.  CBG: No results for input(s): GLUCAP in the last 168 hours.

## 2016-01-26 NOTE — Progress Notes (Signed)
Physical Therapy Treatment Patient Details Name: [name] MRN: [id number] DOB: [birth year] Today's Date: 01/26/2016    History of Present Illness 82 year old female with past medical history of CAD (on aspirin and plavix), hypertension, dyslipidemia, chronic combined CHF ( 2 D ECHO in 201 with EF 35-40% and grade 1 DD), COPD on oxygen who presented to [facility] ED with weakness and fatigue and poor po intake over past few days prior to this admission.     PT Comments    Pt feeling "better" and eager to return to her Indep Living at [place].  Assisted OOB and amb a great distance.  amb on 2 lts as prior needed.    Follow Up Recommendations  Home health PT (at [facility] )     Equipment Recommendations  None recommended by PT    Recommendations for Other Services       Precautions / Restrictions Precautions Precautions: Fall Precaution Comments: home O2 Restrictions Weight Bearing Restrictions: No    Mobility  Bed Mobility Overal bed mobility: Needs Assistance Bed Mobility: Supine to Sit     Supine to sit: Modified independent (Device/Increase time) Sit to supine: Supervision   General bed mobility comments: increased time  Transfers Overall transfer level: Needs assistance Equipment used: Rolling walker (2 wheeled) Transfers: Sit to/from Stand Sit to Stand: Supervision;Min guard         General transfer comment: one VC on safety with turns with O2 tubing  Ambulation/Gait Ambulation/Gait assistance: Supervision;Min guard Ambulation Distance (Feet): 250 Feet Assistive device: 4-wheeled walker Gait Pattern/deviations: Step-through pattern;Decreased stride length;Trunk flexed Gait velocity: WFL   General Gait Details: amb on 2 lts O2 sats ranged from 87 to 91%.  VC's for purse lip breathing.   Stairs            Wheelchair Mobility    Modified Rankin (Stroke Patients Only)       Balance                                    Cognition  Arousal/Alertness: Awake/alert Behavior During Therapy: WFL for tasks assessed/performed Overall Cognitive Status: Within Functional Limits for tasks assessed                      Exercises      General Comments        Pertinent Vitals/Pain      Home Living                      Prior Function            PT Goals (current goals can now be found in the care plan section) Progress towards PT goals: Progressing toward goals    Frequency  Min 3X/week    PT Plan Current plan remains appropriate    Co-evaluation             End of Session Equipment Utilized During Treatment: Oxygen Activity Tolerance: Patient tolerated treatment well Patient left: in bed;with call bell/phone within reach;with bed alarm set     Time: 4098-1191 PT Time Calculation (min) (ACUTE ONLY): 18 min  Charges:  $Gait Training: 8-22 mins                    G Codes:      Felecia Shelling  PTA [facility]  Acute  Rehab Pager      [phone]

## 2016-01-26 NOTE — Progress Notes (Signed)
[facility] here to transport pt to [facility]. Packet as prepared by SW given to ambulance attendant. Pt dc'd via stretcher to meet awaiting ambulance to carry to facility.

## 2016-01-26 NOTE — NC FL2 (Signed)
[place] MEDICAID FL2 LEVEL OF CARE SCREENING TOOL     IDENTIFICATION  Patient Name: [name] Birthdate: Dec 12, 1922 Sex: female Admission Date (Current Location): 01/23/2016  [place] and [place] Number:  [name] [phone] S Facility and Address:  [facility],  [address] [place] [address]      Provider Number: [id number]  Attending Physician Name and Address:  [name], MD  Relative Name and Phone Number:       Current Level of Care: Hospital Recommended Level of Care: Assisted Living Facility Prior Approval Number:    Date Approved/Denied:   PASRR Number:    Discharge Plan: Other (Comment) (Assisted Living Facility)    Current Diagnoses: Patient Active Problem List   Diagnosis Date Noted  . Dehydration 01/23/2016  . Acute renal failure superimposed on stage 4 chronic kidney disease (HCC) 01/23/2016  . Lobar pneumonia, unspecified organism (HCC) 01/23/2016  . Yeast UTI 01/23/2016  . Benign essential HTN 01/23/2016  . Dyslipidemia 01/23/2016  . Protein-calorie malnutrition, severe (HCC) 11/09/2014  . Chronic combined systolic and diastolic heart failure (HCC) 10/17/2012  . Chronic respiratory failure with hypoxia (HCC) 10/16/2012  . COPD GOLD III   . Leukocytosis 06/14/2012    Orientation RESPIRATION BLADDER Height & Weight     Self, Time, Situation, Place  O2 (2.5 L O2) Continent Weight: 83 lb (37.649 kg) Height:  5' (152.4 cm)  BEHAVIORAL SYMPTOMS/MOOD NEUROLOGICAL BOWEL NUTRITION STATUS   (n/a)  (NONE) Continent Diet (low cholestrol/low fat diet)  AMBULATORY STATUS COMMUNICATION OF NEEDS Skin   Limited Assist Verbally Normal                       Personal Care Assistance Level of Assistance  Bathing, Feeding, Dressing Bathing Assistance: Limited assistance (supervision) Feeding assistance: Independent Dressing Assistance: Limited assistance (supervision)     Functional Limitations Info  Sight, Hearing, Speech Sight  Info: Adequate Hearing Info: Impaired Speech Info: Adequate    SPECIAL CARE FACTORS FREQUENCY  PT (By licensed PT)     PT Frequency: 3 x a week              Contractures Contractures Info: Not present    Additional Factors Info  Code Status, Allergies Code Status Info: FULL code status Allergies Info: Altace, Ambien, Codeine, Demerol, Penicillins, Sulfa Antibiotics, Versed           Current Medications (01/26/2016):  This is the current hospital active medication list Current Facility-Administered Medications  Medication Dose Route Frequency Provider Last Rate Last Dose  . acetaminophen (TYLENOL) tablet 650 mg  650 mg Oral Q6H PRN [name], MD   650 mg at 01/24/16 2057  . acidophilus (RISAQUAD) capsule 1 capsule  1 capsule Oral Daily [name], MD   1 capsule at 01/26/16 1032  . albuterol (PROVENTIL) (2.5 MG/3ML) 0.083% nebulizer solution 2.5 mg  2.5 mg Nebulization BID [name], MD   2.5 mg at 01/26/16 0912  . ALPRAZolam Prudy Feeler) tablet 0.5 mg  0.5 mg Oral PRN [name], MD   0.5 mg at 01/26/16 1038  . amLODipine (NORVASC) tablet 2.5 mg  2.5 mg Oral Daily [name], MD   2.5 mg at 01/26/16 1033  . aspirin chewable tablet 81 mg  81 mg Oral q morning - 10a [name], MD   81 mg at 01/26/16 1032  . cholecalciferol (VITAMIN D) tablet 400 Units  400 Units Oral Daily [name],  MD   400 Units at 01/26/16 1032  . clopidogrel (PLAVIX) tablet 75 mg  75 mg Oral q morning - 10a [name], MD   75 mg at 01/26/16 1032  . dextromethorphan (DELSYM) 30 MG/5ML liquid 15 mg  15 mg Oral QHS PRN [name], MD      . docusate sodium (COLACE) capsule 100 mg  100 mg Oral Daily [name], MD   100 mg at 01/26/16 1032  . famotidine (PEPCID) tablet 20 mg  20 mg Oral QHS [name], MD   20 mg at 01/25/16 2023  . feeding supplement (ENSURE ENLIVE) (ENSURE ENLIVE) liquid 237 mL  237 mL Oral BID PRN Tilda Franco, RD      . fluconazole (DIFLUCAN) tablet 100 mg   100 mg Oral Q48H [name], MD   100 mg at 01/26/16 1032  . HYDROcodone-acetaminophen (NORCO/VICODIN) 5-325 MG per tablet 1 tablet  1 tablet Oral Q6H PRN [name], MD      . ipratropium (ATROVENT) nebulizer solution 0.5 mg  0.5 mg Nebulization Q6H PRN [name], MD      . levofloxacin [facility]) IVPB 500 mg  500 mg Intravenous Q48H [name], MD   500 mg at 01/25/16 1811  . loratadine (CLARITIN) tablet 10 mg  10 mg Oral Daily [name], MD   10 mg at 01/26/16 1032  . losartan (COZAAR) tablet 12.5 mg  12.5 mg Oral Daily [name], MD   12.5 mg at 01/26/16 1032  . magic mouthwash w/lidocaine  5 mL Oral TID [name], MD   5 mL at 01/26/16 1034  . metoprolol tartrate (LOPRESSOR) tablet 25 mg  25 mg Oral BID [name], MD   25 mg at 01/26/16 1033  . mirtazapine (REMERON) tablet 7.5 mg  7.5 mg Oral QHS [name], MD   7.5 mg at 01/25/16 2022  . polyethylene glycol (MIRALAX / GLYCOLAX) packet 17 g  17 g Oral Daily PRN [name], MD      . polyvinyl alcohol (LIQUIFILM TEARS) 1.4 % ophthalmic solution 1 drop  1 drop Both Eyes QID [name], MD   1 drop at 01/26/16 1033  . pravastatin (PRAVACHOL) tablet 10 mg  10 mg Oral QHS [name], MD   10 mg at 01/25/16 2022  . senna (SENOKOT) tablet 8.6 mg  1 tablet Oral PRN [name], MD   8.6 mg at 01/24/16 2121  . tiZANidine (ZANAFLEX) tablet 4 mg  4 mg Oral BID [name], MD   4 mg at 01/26/16 1032     Discharge Medications: Please see discharge summary for a list of discharge medications.  Relevant Imaging Results:  Relevant Lab Results:   Additional Information SSN: [id number]. Pt to have Home Health PT at ALF.   Aron Inge A, LCSW

## 2016-01-26 NOTE — Care Management Important Message (Signed)
Important Message  Patient Details  Name: [name] MRN: [id number] Date of Birth: 1922/03/27   Medicare Important Message Given:  Yes    Haskell Flirt 01/26/2016, 11:20 AMImportant Message  Patient Details  Name: [name] MRN: [id number] Date of Birth: 1922-09-18   Medicare Important Message Given:  Yes    Haskell Flirt 01/26/2016, 11:18 AM

## 2016-01-26 NOTE — Progress Notes (Signed)
Attempted several times to call [name] to give report and let facility know pt was on the way back via PTAR. No response therefore message left after third attempt. Awaiting call back.

## 2016-01-26 NOTE — Progress Notes (Signed)
Pt for discharge to [facility].   CSW facilitated pt discharge needs including contacting facility, faxing pt discharge information, confirming ALF received and reviewed information, providing RN phone number to call report, discussing with pt at bedside and pt daughter, Chip Boer via telephone, and arranging ambulance transport for pt to  [facility].  No further social work needs identified at this time.  CSW signing off.   Loletta Specter, MSW, LCSW Clinical Social Work [phone]

## 2016-01-26 NOTE — Care Management Note (Signed)
Case Management Note  Patient Details  Name: [name] MRN: [id number] Date of Birth: 04-10-1922  Subjective/Objective:     Admitted with respiratory failure               Action/Plan: Discharge planning per CSW  Expected Discharge Date:   (unknown)               Expected Discharge Plan:  Assisted Living / Rest Home  In-House Referral:  Clinical Social Work  Discharge planning Services  CM Consult  Post Acute Care Choice:  NA Choice offered to:  NA  DME Arranged:  N/A DME Agency:  NA  HH Arranged:  NA HH Agency:  NA  Status of Service:  Completed, signed off  Medicare Important Message Given:    Date Medicare IM Given:    Medicare IM give by:    Date Additional Medicare IM Given:    Additional Medicare Important Message give by:     If discussed at Long Length of Stay Meetings, dates discussed:    Additional Comments:  Alexis Goodell, RN 01/26/2016, 11:09 AM [phone]

## 2016-01-26 NOTE — Discharge Instructions (Signed)
Take fluconazole for 4 more doses every 48 hours on discharge for yeast urinary tract infection Take Levaquin for 2 more doses every 48 hours of discharge for pneumonia You may continue blood pressure medications as per prior to this admission Urinary Tract Infection Urinary tract infections (UTIs) can develop anywhere along your urinary tract. Your urinary tract is your body's drainage system for removing wastes and extra water. Your urinary tract includes two kidneys, two ureters, a bladder, and a urethra. Your kidneys are a pair of bean-shaped organs. Each kidney is about the size of your fist. They are located below your ribs, one on each side of your spine. CAUSES Infections are caused by microbes, which are microscopic organisms, including fungi, viruses, and bacteria. These organisms are so small that they can only be seen through a microscope. Bacteria are the microbes that most commonly cause UTIs. SYMPTOMS  Symptoms of UTIs may vary by age and gender of the patient and by the location of the infection. Symptoms in young women typically include a frequent and intense urge to urinate and a painful, burning feeling in the bladder or urethra during urination. Older women and men are more likely to be tired, shaky, and weak and have muscle aches and abdominal pain. A fever may mean the infection is in your kidneys. Other symptoms of a kidney infection include pain in your back or sides below the ribs, nausea, and vomiting. DIAGNOSIS To diagnose a UTI, your caregiver will ask you about your symptoms. Your caregiver will also ask you to provide a urine sample. The urine sample will be tested for bacteria and white blood cells. White blood cells are made by your body to help fight infection. TREATMENT  Typically, UTIs can be treated with medication. Because most UTIs are caused by a bacterial infection, they usually can be treated with the use of antibiotics. The choice of antibiotic and length of  treatment depend on your symptoms and the type of bacteria causing your infection. HOME CARE INSTRUCTIONS  If you were prescribed antibiotics, take them exactly as your caregiver instructs you. Finish the medication even if you feel better after you have only taken some of the medication.  Drink enough water and fluids to keep your urine clear or pale yellow.  Avoid caffeine, tea, and carbonated beverages. They tend to irritate your bladder.  Empty your bladder often. Avoid holding urine for long periods of time.  Empty your bladder before and after sexual intercourse.  After a bowel movement, women should cleanse from front to back. Use each tissue only once. SEEK MEDICAL CARE IF:   You have back pain.  You develop a fever.  Your symptoms do not begin to resolve within 3 days. SEEK IMMEDIATE MEDICAL CARE IF:   You have severe back pain or lower abdominal pain.  You develop chills.  You have nausea or vomiting.  You have continued burning or discomfort with urination. MAKE SURE YOU:   Understand these instructions.  Will watch your condition.  Will get help right away if you are not doing well or get worse.   This information is not intended to replace advice given to you by your health care provider. Make sure you discuss any questions you have with your health care provider.   Document Released: 09/19/2005 Document Revised: 08/31/2015 Document Reviewed: 01/18/2012 Elsevier Interactive Patient Education 2016 Elsevier Inc. Community-Acquired Pneumonia, Adult Pneumonia is an infection of the lungs. There are different types of pneumonia. One type can develop  while a person is in a hospital. A different type, called community-acquired pneumonia, develops in people who are not, or have not recently been, in the hospital or other health care facility.  CAUSES Pneumonia may be caused by bacteria, viruses, or funguses. Community-acquired pneumonia is often caused by  Streptococcus pneumonia bacteria. These bacteria are often passed from one person to another by breathing in droplets from the cough or sneeze of an infected person. RISK FACTORS The condition is more likely to develop in:  People who havechronic diseases, such as chronic obstructive pulmonary disease (COPD), asthma, congestive heart failure, cystic fibrosis, diabetes, or kidney disease.  People who haveearly-stage or late-stage HIV.  People who havesickle cell disease.  People who havehad their spleen removed (splenectomy).  People who havepoor Administrator.  People who havemedical conditions that increase the risk of breathing in (aspirating) secretions their own mouth and nose.   People who havea weakened immune system (immunocompromised).  People who smoke.  People whotravel to areas where pneumonia-causing germs commonly exist.  People whoare around animal habitats or animals that have pneumonia-causing germs, including birds, bats, rabbits, cats, and farm animals. SYMPTOMS Symptoms of this condition include:  Adry cough.  A wet (productive) cough.  Fever.  Sweating.  Chest pain, especially when breathing deeply or coughing.  Rapid breathing or difficulty breathing.  Shortness of breath.  Shaking chills.  Fatigue.  Muscle aches. DIAGNOSIS Your health care provider will take a medical history and perform a physical exam. You may also have other tests, including:  Imaging studies of your chest, including X-rays.  Tests to check your blood oxygen level and other blood gases.  Other tests on blood, mucus (sputum), fluid around your lungs (pleural fluid), and urine. If your pneumonia is severe, other tests may be done to identify the specific cause of your illness. TREATMENT The type of treatment that you receive depends on many factors, such as the cause of your pneumonia, the medicines you take, and other medical conditions that you have. For most  adults, treatment and recovery from pneumonia may occur at home. In some cases, treatment must happen in a hospital. Treatment may include:  Antibiotic medicines, if the pneumonia was caused by bacteria.  Antiviral medicines, if the pneumonia was caused by a virus.  Medicines that are given by mouth or through an IV tube.  Oxygen.  Respiratory therapy. Although rare, treating severe pneumonia may include:  Mechanical ventilation. This is done if you are not breathing well on your own and you cannot maintain a safe blood oxygen level.  Thoracentesis. This procedureremoves fluid around one lung or both lungs to help you breathe better. HOME CARE INSTRUCTIONS  Take over-the-counter and prescription medicines only as told by your health care provider.  Only takecough medicine if you are losing sleep. Understand that cough medicine can prevent your body's natural ability to remove mucus from your lungs.  If you were prescribed an antibiotic medicine, take it as told by your health care provider. Do not stop taking the antibiotic even if you start to feel better.  Sleep in a semi-upright position at night. Try sleeping in a reclining chair, or place a few pillows under your head.  Do not use tobacco products, including cigarettes, chewing tobacco, and e-cigarettes. If you need help quitting, ask your health care provider.  Drink enough water to keep your urine clear or pale yellow. This will help to thin out mucus secretions in your lungs. PREVENTION There are ways  that you can decrease your risk of developing community-acquired pneumonia. Consider getting a pneumococcal vaccine if:  You are older than 80 years of age.  You are older than 80 years of age and are undergoing cancer treatment, have chronic lung disease, or have other medical conditions that affect your immune system. Ask your health care provider if this applies to you. There are different types and schedules of  pneumococcal vaccines. Ask your health care provider which vaccination option is best for you. You may also prevent community-acquired pneumonia if you take these actions:  Get an influenza vaccine every year. Ask your health care provider which type of influenza vaccine is best for you.  Go to the dentist on a regular basis.  Wash your hands often. Use hand sanitizer if soap and water are not available. SEEK MEDICAL CARE IF:  You have a fever.  You are losing sleep because you cannot control your cough with cough medicine. SEEK IMMEDIATE MEDICAL CARE IF:  You have worsening shortness of breath.  You have increased chest pain.  Your sickness becomes worse, especially if you are an older adult or have a weakened immune system.  You cough up blood.   This information is not intended to replace advice given to you by your health care provider. Make sure you discuss any questions you have with your health care provider.   Document Released: 12/10/2005 Document Revised: 08/31/2015 Document Reviewed: 04/06/2015 Elsevier Interactive Patient Education [employer]

## 2016-01-28 LAB — CULTURE, BLOOD (ROUTINE X 2)
CULTURE: NO GROWTH
CULTURE: NO GROWTH

## 2016-01-28 LAB — CULTURE, RESPIRATORY W GRAM STAIN: Culture: NORMAL

## 2016-02-20 ENCOUNTER — Other Ambulatory Visit (INDEPENDENT_AMBULATORY_CARE_PROVIDER_SITE_OTHER): Payer: Medicare Other

## 2016-02-20 ENCOUNTER — Telehealth: Payer: Self-pay | Admitting: Pulmonary Disease

## 2016-02-20 ENCOUNTER — Ambulatory Visit (INDEPENDENT_AMBULATORY_CARE_PROVIDER_SITE_OTHER): Payer: Medicare Other | Admitting: Internal Medicine

## 2016-02-20 ENCOUNTER — Ambulatory Visit (INDEPENDENT_AMBULATORY_CARE_PROVIDER_SITE_OTHER)
Admission: RE | Admit: 2016-02-20 | Discharge: 2016-02-20 | Disposition: A | Discharge status: Home / Self Care | Payer: Medicare Other | Source: Ambulatory Visit | Attending: Internal Medicine | Admitting: Internal Medicine

## 2016-02-20 VITALS — BP 142/80 | HR 65

## 2016-02-20 DIAGNOSIS — R5381 Other malaise: Secondary | ICD-10-CM | POA: Diagnosis not present

## 2016-02-20 DIAGNOSIS — N179 Acute kidney failure, unspecified: Secondary | ICD-10-CM

## 2016-02-20 DIAGNOSIS — J181 Lobar pneumonia, unspecified organism: Secondary | ICD-10-CM | POA: Diagnosis not present

## 2016-02-20 DIAGNOSIS — J9621 Acute and chronic respiratory failure with hypoxia: Secondary | ICD-10-CM | POA: Diagnosis not present

## 2016-02-20 LAB — MAGNESIUM: Magnesium: 2.1 mg/dL (ref 1.5–2.5)

## 2016-02-20 LAB — HEPATIC FUNCTION PANEL
ALBUMIN: 4.1 g/dL (ref 3.5–5.2)
ALK PHOS: 62 U/L (ref 39–117)
ALT: 8 U/L (ref 0–35)
AST: 22 U/L (ref 0–37)
Bilirubin, Direct: 0.1 mg/dL (ref 0.0–0.3)
TOTAL PROTEIN: 7.7 g/dL (ref 6.0–8.3)
Total Bilirubin: 0.4 mg/dL (ref 0.2–1.2)

## 2016-02-20 LAB — BASIC METABOLIC PANEL
BUN: 20 mg/dL (ref 6–23)
CALCIUM: 9.6 mg/dL (ref 8.4–10.5)
CO2: 37 mEq/L — ABNORMAL HIGH (ref 19–32)
CREATININE: 1.08 mg/dL (ref 0.40–1.20)
Chloride: 98 mEq/L (ref 96–112)
GFR: 50.48 mL/min — AB (ref >60.00)
GLUCOSE: 94 mg/dL (ref 70–99)
Potassium: 4.6 mEq/L (ref 3.5–5.1)
SODIUM: 140 meq/L (ref 135–145)

## 2016-02-20 LAB — PHOSPHORUS: PHOSPHORUS: 3.6 mg/dL (ref 2.3–4.6)

## 2016-02-20 NOTE — Telephone Encounter (Signed)
Spoke with pt, offered appt tomorrow at 10;45 with TP- pt cannot make any morning appts.  Pt scheduled with MR at 3:15 this afternoon.  Nothing further needed.

## 2016-02-20 NOTE — Patient Instructions (Signed)
ICD-9-CM ICD-10-CM   1. Acute on chronic respiratory failure with hypoxia (HCC) 518.84 J96.21    799.02    2. Lobar pneumonia (HCC) 481 J18.1   3. Physical deconditioning 799.3 R53.81   4. Acute kidney injury (HCC) 584.9 N17.9     Do cbc with diff, bmet, phos, mag, lft Do CXR 2 view - Have PT monitor pulse ox and ensure is >/= 88% - titrate oxygen accordinlgly  FOllowup  - dependent on results

## 2016-02-20 NOTE — Progress Notes (Signed)
Subjective:     Patient ID: [name], female   DOB: 1922-03-01, 82 y.o.   MRN: [id number]  HPI  OV 02/20/2016 Chief Complaint  Patient presents with  . Acute Visit    VS pt here for SOB and fatigue. 2L continous and 3L when walking. Pt's daughter feels her mother is not her normal. She's more forgetful now.     This is an acute visit for [name] 82 year old patient of Dr. Craige Cotta who she sees for COPD and chronic hypoxemic respiratory failure on baseline 2 L oxygen. Accompanied by daughter. History provided by review of the charts and by talking to the daughter. Daughter states that 12 January 2016 was seen in the pulmonary office was given anabiotic's and prednisone for COPD exacerbation. But she did not improve. Chest x-ray that time did not show pneumonia according to the daughter. Subsequently at [facility] living chest x-ray apparent showed pneumonia. She was also having worsening fatigue. Brought into the hospital [name] 30th 2017 and labs show acute kidney insufficiency with a creatinine in the twos. She was given fluids and antibiotics. Chest x-ray at that time which I personally visualized to show pulmonary infiltrates in the lower lobe and was diagnosed as pneumonia and was treated as such. And was discharged 01/26/2016.  Since then daughter says that fatigue continues. Improvement does not as expected. Also more hypoxemic. Physical therapy is noticing desaturations even on 3 L oxygen. This is worse than baseline. Patient also having dyspnea.   has no past medical history on file.   reports that she quit smoking about 19 years ago. She has never used smokeless tobacco.  Past Surgical History  Procedure Laterality Date  . Femoral bypass    . Esophagogastroduodenoscopy  06/21/2012    Procedure: ESOPHAGOGASTRODUODENOSCOPY (EGD);  Surgeon: Theda Belfast, MD;  Location: [facility];  Service: Endoscopy;  Laterality: N/A;    Allergies  Allergen Reactions  . Altace [Ramipril]  Other (See Comments)    Reaction unknown  . Ambien [Zolpidem Tartrate] Other (See Comments)    Reaction unknown  . Codeine Other (See Comments)    Reaction unknown  . Demerol [Meperidine] Other (See Comments)    Reaction unknown  . Penicillins Other (See Comments)    Reaction unknown did PCN reaction causing immediate rash, facial/tongue/throat swelling, SOB or lightheadedness with hypotension:  did PCN reaction causing severe rash involving mucus membranes or skin necrosis:  Did PCN reaction that required hospitalization  Did PCN reaction occurring within the last 10 years:  If all of the above answers are "NO", then may proceed with Cephalosporin use. Listed on MAR, unable to answer questions  . Sulfa Antibiotics Other (See Comments)    Reaction unknown  . Versed [Midazolam] Other (See Comments)    Reaction unknown    Immunization History  Administered Date(s) Administered  . Influenza,inj,Quad PF,36+ Mos 10/27/2014  . Pneumococcal Conjugate-13 10/27/2014    Family History  Problem Relation Age of Onset  . Coronary artery disease Father   . Coronary artery disease Mother   . Hypertension Mother   . Breast cancer Maternal Grandmother   . Breast cancer Maternal Aunt      Current outpatient prescriptions:  .  acetaminophen (TYLENOL) 325 MG tablet, Take 2 tablets (650 mg total) by mouth every 6 (six) hours as needed for fever., Disp: , Rfl:  .  albuterol (PROVENTIL) (2.5 MG/3ML) 0.083% nebulizer solution, Take 2.5 mg by nebulization 2 (two) times daily., Disp: , Rfl:  .  ALPRAZolam (XANAX) 0.5 MG tablet, Take 0.5 mg by mouth as needed for anxiety or sleep., Disp: , Rfl:  .  amLODipine (NORVASC) 5 MG tablet, Take 1 tablet (5 mg total) by mouth daily. (Patient not taking: Reported on 01/23/2016), Disp: , Rfl:  .  aspirin 81 MG chewable tablet, Chew 81 mg by mouth every morning. , Disp: , Rfl:  .  Carboxymethylcellul-Glycerin 0.5-0.9 % SOLN, Place 1 drop into both eyes 4 (four)  times daily., Disp: , Rfl:  .  Cholecalciferol (VITAMIN D3) 400 UNITS CAPS, Take 1 capsule by mouth daily., Disp: , Rfl:  .  clopidogrel (PLAVIX) 75 MG tablet, Take 75 mg by mouth every morning. , Disp: , Rfl:  .  dextromethorphan (DELSYM) 30 MG/5ML liquid, Take 2.5 mLs (15 mg total) by mouth at bedtime as needed for cough., Disp: 89 mL, Rfl: 0 .  docusate sodium (COLACE) 100 MG capsule, Take 100 mg by mouth daily. , Disp: , Rfl:  .  famotidine (PEPCID) 20 MG tablet, Take 20 mg by mouth at bedtime., Disp: , Rfl:  .  feeding supplement, ENSURE COMPLETE, (ENSURE COMPLETE) LIQD, Take 237 mLs by mouth 2 (two) times daily between meals. (Patient not taking: Reported on 01/23/2016), Disp: , Rfl:  .  feeding supplement, ENSURE ENLIVE, (ENSURE ENLIVE) LIQD, Take 237 mLs by mouth 2 (two) times daily as needed (Question if pt likes supplement)., Disp: 237 mL, Rfl: 12 .  fluconazole (DIFLUCAN) 100 MG tablet, Take 1 tablet (100 mg total) by mouth every other day., Disp: 4 tablet, Rfl: 0 .  HYDROcodone-acetaminophen (NORCO/VICODIN) 5-325 MG tablet, Take 1 tablet by mouth every 6 (six) hours as needed for moderate pain., Disp: , Rfl:  .  ipratropium (ATROVENT) 0.02 % nebulizer solution, Take 0.5 mg by nebulization every 6 (six) hours as needed for wheezing or shortness of breath. , Disp: , Rfl:  .  ipratropium (ATROVENT) 0.03 % nasal spray, Place 2 sprays into the nose 3 (three) times daily as needed for rhinitis., Disp: 30 mL, Rfl: 2 .  levofloxacin (LEVAQUIN) 500 MG tablet, Take 1 tablet (500 mg total) by mouth every other day., Disp: 2 tablet, Rfl: 0 .  Loratadine 10 MG CAPS, Take 1 capsule by mouth daily., Disp: , Rfl:  .  losartan (COZAAR) 25 MG tablet, Take 12.5 mg by mouth daily., Disp: , Rfl:  .  magic mouthwash w/lidocaine SOLN, Take 5 mLs by mouth 3 (three) times daily., Disp: 100 mL, Rfl: 1 .  Melatonin 1 MG TABS, Take 1 mg by mouth at bedtime., Disp: , Rfl:  .  metoprolol tartrate (LOPRESSOR) 25 MG  tablet, Take 25 mg by mouth 2 (two) times daily., Disp: , Rfl:  .  mirtazapine (REMERON) 7.5 MG tablet, Take 7.5 mg by mouth at bedtime., Disp: , Rfl:  .  nitroGLYCERIN (NITRODUR - DOSED IN MG/24 HR) 0.2 mg/hr patch, Place 0.2 mg onto the skin daily., Disp: , Rfl:  .  Nutritional Supplements (NUTRITIONAL SHAKE PO), Take 1 Container by mouth 3 (three) times daily. Health Shakes TID, Disp: , Rfl:  .  PEG 3350-KCl-Na Bicarb-NaCl (TRILYTE PO), Take 8 oz by mouth every Monday. Drink 8 oz glass of Trilyte bowel prep every 15-30 minutes until 1/4 solution is gone every monday, Disp: , Rfl:  .  pravastatin (PRAVACHOL) 10 MG tablet, Take 10 mg by mouth at bedtime. , Disp: , Rfl:  .  Probiotic Product (ALIGN) 4 MG CAPS, Take 1 capsule by mouth daily., Disp: ,  Rfl:  .  senna (SENOKOT) 8.6 MG tablet, Take 1 tablet by mouth as needed for constipation., Disp: , Rfl:  .  tiZANidine (ZANAFLEX) 4 MG tablet, Take 4 mg by mouth 2 (two) times daily., Disp: , Rfl:      Review of Systems According to history of present illness    Objective:   Physical Exam Vital signs on 2-3 liters of oxygen at rest  Filed Vitals:   02/20/16 1616  BP: 142/80  Pulse: 65  SpO2: 94%    Gen. exam: Frail elderly female. Looks well but somewhat deconditioned Neurologic: Alert and oriented 3. Gait normal. Speech normal Respiratory exam: Some crackles at the base. Cardiovascular: Normal heart sounds. No murmurs Abdomen: Soft Extremity: No sinus no clubbing no edema     Assessment:       ICD-9-CM ICD-10-CM   1. Acute on chronic respiratory failure with hypoxia (HCC) 518.84 J96.21    799.02    2. Lobar pneumonia (HCC) 481 J18.1   3. Physical deconditioning 799.3 R53.81   4. Acute kidney injury (HCC) 584.9 N17.9        Plan:     She does have significant physical deconditioning. However I do not understand why she still more hypoxemic than baseline. Therefore need to recheck her blood work and a chest x-ray. Based on  the chest X she might need CT chest because a CT chest in 2013 was significantly abnormal at the base    Do cbc with diff, bmet, phos, mag, lft Do CXR 2 view - Have PT monitor pulse ox and ensure is >/= 88% - titrate oxygen accordinlgly  FOllowup  - dependent on results   Dr. Kalman Shan, M.D., [facility].C.P Pulmonary and Critical Care Medicine Staff Physician [facility] Pager: [phone], If no answer or between  15:00h - 7:00h: call [phone]  02/20/2016 4:27 PM

## 2016-02-21 ENCOUNTER — Telehealth: Payer: Self-pay | Admitting: Internal Medicine

## 2016-02-21 DIAGNOSIS — R0600 Dyspnea, unspecified: Secondary | ICD-10-CM

## 2016-02-21 LAB — CBC WITH DIFFERENTIAL
BASOS: 0 %
Basophils Absolute: 0 10*3/uL (ref 0.0–0.2)
EOS (ABSOLUTE): 0.2 10*3/uL (ref 0.0–0.4)
Eos: 2 %
HEMATOCRIT: 38.9 % (ref 34.0–46.6)
HEMOGLOBIN: 12.6 g/dL (ref 11.1–15.9)
IMMATURE GRANS (ABS): 0 10*3/uL (ref 0.0–0.1)
Immature Granulocytes: 0 %
LYMPHS ABS: 1.2 10*3/uL (ref 0.7–3.1)
LYMPHS: 15 %
MCH: 28.9 pg (ref 26.6–33.0)
MCHC: 32.4 g/dL (ref 31.5–35.7)
MCV: 89 fL (ref 79–97)
MONOCYTES: 8 %
Monocytes Absolute: 0.7 10*3/uL (ref 0.1–0.9)
NEUTROS ABS: 6.2 10*3/uL (ref 1.4–7.0)
Neutrophils: 75 %
RBC: 4.36 x10E6/uL (ref 3.77–5.28)
RDW: 13.9 % (ref 12.3–15.4)
WBC: 8.3 10*3/uL (ref 3.4–10.8)

## 2016-02-21 NOTE — Telephone Encounter (Signed)
   Triage  Saw this DR Craige Cotta patient acutely yesterday. Pls let daughter/patient know that labs normal but CXR indicates possible fibrosis at base that could explain hypoxemia. Please  .High Resolution CT chest without contrast on ILD protocol. Only  Dr Leanna Battles or Dr Cleone Slim Dr. Trudie Reed to read - do supine and prone  AFter that  - review with Dr Craige Cotta because this is his patient (have copied Dr Craige Cotta)   PULMONARY No results for input(s): PHART, PCO2ART, PO2ART, HCO3, TCO2, O2SAT in the last 168 hours.  Invalid input(s): PCO2, PO2  CBC  Recent Labs Lab 02/20/16 1646  HCT 38.9  WBC 8.3    COAGULATION No results for input(s): INR in the last 168 hours.  CARDIAC  No results for input(s): TROPONINI in the last 168 hours. No results for input(s): PROBNP in the last 168 hours.   CHEMISTRY  Recent Labs Lab 02/20/16 1646  NA 140  K 4.6  CL 98  CO2 37*  GLUCOSE 94  BUN 20  CREATININE 1.08  CALCIUM 9.6  MG 2.1  PHOS 3.6   Estimated Creatinine Clearance: 20.1 mL/min (by C-G formula based on Cr of 1.08).   LIVER  Recent Labs Lab 02/20/16 1646  AST 22  ALT 8  ALKPHOS 62  BILITOT 0.4  PROT 7.7  ALBUMIN 4.1     INFECTIOUS No results for input(s): LATICACIDVEN, PROCALCITON in the last 168 hours.   ENDOCRINE CBG (last 3)  No results for input(s): GLUCAP in the last 72 hours.       IMAGING x48h  - image(s) personally visualized  -   highlighted in bold Dg Chest 2 View  02/21/2016  CLINICAL DATA:  Cough and shortness of breath for the past week; former smoker, history of COPD and CHF and chronic renal insufficiency. EXAM: CHEST  2 VIEW COMPARISON:  PA and lateral chest x-ray of January 23, 2016 FINDINGS: The lungs remain hyperinflated. The pulmonary interstitial markings remain increased diffusely. There is hazy increased density in the right infrahilar region which accentuated by overlying breast parenchyma. There is somewhat similar  appearance on the left. The heart and pulmonary vascularity are normal. The mediastinum is normal in width. There is no pleural effusion. The bony thorax exhibits no acute abnormality. IMPRESSION: Marked hyperinflation consistent with COPD. Chronically increased pulmonary interstitial markings consistent with pulmonary fibrosis. There is no alveolar pneumonia nor CHF. One cannot exclude superimposed acute bronchitis in the appropriate clinical setting. Electronically Signed   By: David  [place] M.D.   On: 02/21/2016 08:22

## 2016-02-22 ENCOUNTER — Telehealth: Payer: Self-pay | Admitting: Internal Medicine

## 2016-02-22 NOTE — Telephone Encounter (Signed)
Called and spoke to Shona at pt's living facility and informed her that the pt will need to leave the facility in the upcoming future CT chest. Shona advised to call pt's daughter and inform her of the recs for HRCT to help with transportation. LMTCB for pt's daughter, Chip Boer.

## 2016-02-22 NOTE — Telephone Encounter (Signed)
Attempted to contact [facility]. Line rang several times with no answer. Will try back.

## 2016-02-23 NOTE — Telephone Encounter (Signed)
Called and spoke with Chip Boer. Informed her of MR's results and recs. She voiced understanding and had no further questions. Nothing further needed at this time.

## 2016-02-23 NOTE — Telephone Encounter (Signed)
Spoke with [employer]. She wanted to know if she can titrate the pt's oxygen up if needed if she desats. Gave her the verbal to titrate oxygen if needed. Nothing further was needed.

## 2016-02-23 NOTE — Telephone Encounter (Signed)
[name] cb [phone] please cb after 930am

## 2016-02-27 ENCOUNTER — Ambulatory Visit (INDEPENDENT_AMBULATORY_CARE_PROVIDER_SITE_OTHER)
Admission: RE | Admit: 2016-02-27 | Discharge: 2016-02-27 | Disposition: A | Discharge status: Home / Self Care | Payer: Medicare Other | Source: Ambulatory Visit | Attending: Internal Medicine | Admitting: Internal Medicine

## 2016-02-27 ENCOUNTER — Telehealth: Payer: Self-pay | Admitting: Internal Medicine

## 2016-02-27 DIAGNOSIS — R0600 Dyspnea, unspecified: Secondary | ICD-10-CM | POA: Diagnosis not present

## 2016-02-27 NOTE — Telephone Encounter (Signed)
Left message for Dorothy SparkSmita Patel from [facility] to return call.

## 2016-02-27 NOTE — Telephone Encounter (Signed)
Spoke with Dorothy SparkSmita Patel at [facility] regarding the patient's O2 titration request from 02/20/16 OV 2 Liters continuous at rest pt avg'd 89-90% Exertion on 2 Liters pt avg'd in 60s with very slow recovery O2 titrated to 4 Liters - pt was 96% at rest and (walking to the bathroom in apartment - about 41 ft) 88% with exertion with recovery to 90% in 11 seconds Walking longer distances O2 was increased to 4.5 Liters to maintain and keep O2 above 90% Pt cannot bend over to change O2 level on concentrator and is unable to change the liter flow herself - would need assistance with this any time she needs to change the liter flow.  Pt the PT this is hazardous to the patient. Long distance titration to be repeated again Wednesday 02/29/16 during PT visit. Will send to Dr Marchelle Gearingamaswamy as Lorain ChildesFYI.

## 2016-02-29 ENCOUNTER — Telehealth: Payer: Self-pay | Admitting: Pulmonary Disease

## 2016-02-29 NOTE — Telephone Encounter (Signed)
Attempted to contact Snita. There [facility] answer at [facility]. Will attempt to call her back.

## 2016-02-29 NOTE — Telephone Encounter (Signed)
Pt is requesting results on her CT from 02/27/2016.  MR - please advise. Thanks.

## 2016-03-01 NOTE — Telephone Encounter (Signed)
Spoke with Chip BoerVicki and is aware of results. Nothing further needed

## 2016-03-01 NOTE — Telephone Encounter (Signed)
Called and spoke with pt's daughter. I informed her that we have not received MR's results and recs. I explained that once we receive these we will return her call. She voiced understanding and had no further questions. Pt has ov with VS on 03/02/16.   MR please advise, thanks

## 2016-03-01 NOTE — Telephone Encounter (Signed)
Let them know there is bronchiectasis and emphysema. What this all means they should talk to DR Craige CottaSood 03/02/16 when they see him  .....  IMPRESSION: 1. 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 2. These findings are superimposed on moderate to severe centrilobular emphysema, diffuse bronchial wall thickening and lung hyperinflation, suggesting underlying COPD. 3. Mild cardiomegaly and coronary atherosclerosis. 4. Left adrenal adenoma.   Electronically Signed  By: Delbert PhenixJason A Poff M.D.  On: 02/27/2016 14:25

## 2016-03-01 NOTE — Telephone Encounter (Signed)
Called and spoke with SomaliaSnita with [facility]. She states that last week the pt's oxygen level on 2L was ranging 89-90%. This week the pt's oxygen level is ranging 89-92% on 4L. She states that the pt's SOB has worsened in the last week. I offered ov with VS on 03/02/16 at 3:00pm or 3:30pm. She stated that she mus discuss the appointment with the pt's daughter Lynden AngVicky but will return our call to make the ov. She  Voiced understanding and had no further questions. Will await CB to schedule ov.

## 2016-03-02 ENCOUNTER — Ambulatory Visit (INDEPENDENT_AMBULATORY_CARE_PROVIDER_SITE_OTHER): Payer: Medicare Other | Admitting: Pulmonary Disease

## 2016-03-02 ENCOUNTER — Encounter: Payer: Self-pay | Admitting: Pulmonary Disease

## 2016-03-02 VITALS — BP 114/70 | HR 73 | Ht 60.0 in | Wt 83.2 lb

## 2016-03-02 DIAGNOSIS — J479 Bronchiectasis, uncomplicated: Secondary | ICD-10-CM | POA: Diagnosis not present

## 2016-03-02 DIAGNOSIS — J9611 Chronic respiratory failure with hypoxia: Secondary | ICD-10-CM | POA: Diagnosis not present

## 2016-03-02 DIAGNOSIS — J432 Centrilobular emphysema: Secondary | ICD-10-CM | POA: Diagnosis not present

## 2016-03-02 NOTE — Progress Notes (Signed)
Current Outpatient Prescriptions on File Prior to Visit  Medication Sig  . albuterol (PROVENTIL) (2.5 MG/3ML) 0.083% nebulizer solution Take 2.5 mg by nebulization 2 (two) times daily.  Marland Kitchen. ALPRAZolam (XANAX) 0.5 MG tablet Take 0.5 mg by mouth as needed for anxiety or sleep.  Marland Kitchen. amLODipine (NORVASC) 5 MG tablet Take 1 tablet (5 mg total) by mouth daily.  Marland Kitchen. aspirin 81 MG chewable tablet Chew 81 mg by mouth every morning.   . Cholecalciferol (VITAMIN D3) 400 UNITS CAPS Take 1 capsule by mouth daily.  . clopidogrel (PLAVIX) 75 MG tablet Take 75 mg by mouth every morning.   Marland Kitchen. dextromethorphan (DELSYM) 30 MG/5ML liquid Take 2.5 mLs (15 mg total) by mouth at bedtime as needed for cough.  . docusate sodium (COLACE) 100 MG capsule Take 100 mg by mouth daily.   . famotidine (PEPCID) 20 MG tablet Take 20 mg by mouth at bedtime.  . feeding supplement, ENSURE COMPLETE, (ENSURE COMPLETE) LIQD Take 237 mLs by mouth 2 (two) times daily between meals.  . feeding supplement, ENSURE ENLIVE, (ENSURE ENLIVE) LIQD Take 237 mLs by mouth 2 (two) times daily as needed (Question if pt likes supplement).  Marland Kitchen. HYDROcodone-acetaminophen (NORCO/VICODIN) 5-325 MG tablet Take 1 tablet by mouth every 6 (six) hours as needed for moderate pain.  Marland Kitchen. ipratropium (ATROVENT) 0.02 % nebulizer solution Take 0.5 mg by nebulization every 6 (six) hours as needed for wheezing or shortness of breath.   Marland Kitchen. ipratropium (ATROVENT) 0.03 % nasal spray Place 2 sprays into the nose 3 (three) times daily as needed for rhinitis.  . Loratadine 10 MG CAPS Take 1 capsule by mouth daily.  Marland Kitchen. losartan (COZAAR) 25 MG tablet Take 12.5 mg by mouth daily.  . Melatonin 1 MG TABS Take 1 mg by mouth at bedtime.  . metoprolol tartrate (LOPRESSOR) 25 MG tablet Take 25 mg by mouth 2 (two) times daily.  . mirtazapine (REMERON) 7.5 MG tablet Take 7.5 mg by mouth at bedtime.  . nitroGLYCERIN (NITRODUR - DOSED IN MG/24 HR) 0.2 mg/hr patch Place 0.2 mg onto the skin daily.   . Nutritional Supplements (NUTRITIONAL SHAKE PO) Take 1 Container by mouth 3 (three) times daily. Health Shakes TID  . PEG 3350-KCl-Na Bicarb-NaCl (TRILYTE PO) Take 8 oz by mouth every Monday. Drink 8 oz glass of Trilyte bowel prep every 15-30 minutes until 1/4 solution is gone every monday  . pravastatin (PRAVACHOL) 10 MG tablet Take 10 mg by mouth at bedtime.   . Probiotic Product (ALIGN) 4 MG CAPS Take 1 capsule by mouth daily.  Marland Kitchen. senna (SENOKOT) 8.6 MG tablet Take 1 tablet by mouth as needed for constipation.  Marland Kitchen. tiZANidine (ZANAFLEX) 4 MG tablet Take 4 mg by mouth 2 (two) times daily.  Marland Kitchen. acetaminophen (TYLENOL) 325 MG tablet Take 2 tablets (650 mg total) by mouth every 6 (six) hours as needed for fever. (Patient not taking: Reported on 03/02/2016)  . Carboxymethylcellul-Glycerin 0.5-0.9 % SOLN Place 1 drop into both eyes 4 (four) times daily. Reported on 03/02/2016  . magic mouthwash w/lidocaine SOLN Take 5 mLs by mouth 3 (three) times daily. (Patient not taking: Reported on 03/02/2016)   No current facility-administered medications on file prior to visit.     Chief Complaint  Patient presents with  . Follow-up    Increased SOB- PT has been working to titrate O2 d/t worsening DOE and SOB with activity. Discuss CT results     Tests 10/16/12 Echo >> mod LVH, EF 40 to  45%, grade 1 diastolic dysfx  11/05/12 CT chest >> no PE, small b/l effusions, emphysema, basilar ATX  12/02/12 Spirometry >> FEV1 0.65 (47%), FEV1% 40 02/27/16 HR CT chest >> mod/severe centrilobular emphysema, mild BTX b/l, mild tree in bud,   Past medical hx HTN, PAD, Anxiety, CAD, combined CHF, IBS, Dementia, PNA, Esophageal stricture  Past surgical hx, Allergies, Family hx, Social hx all reviewed.  Vital Signs BP 114/70 mmHg  Pulse 73  Ht 5' (1.524 m)  Wt 83 lb 3.2 oz (37.739 kg)  BMI 16.25 kg/m2  SpO2 95%  History of Present Illness [name] is a 82 y.o. female with COPD/emphysema and chronic  respiratory failure with hypoxia.  She was in hospital in February for pneumonia.  She continues to have runny nose.  She is not using nose spray.  She gets cough >> mostly at night when laying flat.  She is not bringing up sputum.  She is not getting wheeze.  She gets neb tx at NH bid.  She has been using 3 to 4 liters oxygen since being in hospital.  She gets winded more easily and then takes her longer to recover.   Physical Exam  General - thin, wearing oxygen ENT - no sinus tenderness, clear nasal discharge, no oral exudate Cardiac - s1s2 regular, no murmur Chest - prolonged exhalation, no wheeze/crackles Back - No focal tenderness Abd - Soft, non-tender Ext - No edema Neuro - Normal strength Skin - No rashes Psych - normal mood, and behavior   Assessment/Plan  COPD with emphysema. Plan: - continue nebulizer every 4 to 6 hours as needed  Mild bronchiectasis on CT chest >> likely related to recurrent aspiration. Plan: - monitor clinically  Chronic hypoxic respiratory failure with hypoxia. Plan: - she is to use 3 liters oxygen with rest/sleep and 4 liters with exertion - will arrange for her to get a portable pulse oximeter  Vasomotor rhinitis. Plan: - will refill atrovent nasal spray  Goals of Care >> DNR/DNI   Patient Instructions  Atrovent nose spray 3 times per day as needed for runny nose  Atrovent/Albuterol in nebulizer for breathing up to four times per day as needed  Will arrange for pulse oxygen meter  Follow up in 6 months     Coralyn Helling, MD [facility] Pager:  [phone] 03/02/2016, 3:46 PM

## 2016-03-02 NOTE — Telephone Encounter (Signed)
Called and spoke to ClaudeSnita and questioned if pt was willing to come in for appt. Snita states the pt's daughter, Chip BoerVicki, was to call and speak with us about the appt. Obie DredgeSnita states she will call Chip BoerVicki to call us back. Will await call.

## 2016-03-02 NOTE — Patient Instructions (Signed)
Atrovent nose spray 3 times per day as needed for runny nose  Atrovent/Albuterol in nebulizer for breathing up to four times per day as needed  Will arrange for pulse oxygen meter  Follow up in 6 months

## 2016-03-06 ENCOUNTER — Emergency Department (HOSPITAL_COMMUNITY): Payer: Medicare Other

## 2016-03-06 ENCOUNTER — Encounter (HOSPITAL_COMMUNITY): Payer: Self-pay | Admitting: Emergency Medicine

## 2016-03-06 ENCOUNTER — Emergency Department (HOSPITAL_COMMUNITY)
Admission: EM | Admit: 2016-03-06 | Discharge: 2016-03-06 | Disposition: A | Discharge status: Home / Self Care | Payer: Medicare Other | Attending: Emergency Medicine | Admitting: Emergency Medicine

## 2016-03-06 DIAGNOSIS — R0602 Shortness of breath: Secondary | ICD-10-CM | POA: Diagnosis present

## 2016-03-06 DIAGNOSIS — J209 Acute bronchitis, unspecified: Secondary | ICD-10-CM

## 2016-03-06 DIAGNOSIS — J440 Chronic obstructive pulmonary disease with acute lower respiratory infection: Secondary | ICD-10-CM | POA: Insufficient documentation

## 2016-03-06 DIAGNOSIS — F0390 Unspecified dementia without behavioral disturbance: Secondary | ICD-10-CM | POA: Insufficient documentation

## 2016-03-06 DIAGNOSIS — Z8719 Personal history of other diseases of the digestive system: Secondary | ICD-10-CM | POA: Diagnosis not present

## 2016-03-06 DIAGNOSIS — F419 Anxiety disorder, unspecified: Secondary | ICD-10-CM | POA: Insufficient documentation

## 2016-03-06 DIAGNOSIS — I5040 Unspecified combined systolic (congestive) and diastolic (congestive) heart failure: Secondary | ICD-10-CM | POA: Diagnosis not present

## 2016-03-06 DIAGNOSIS — Z79899 Other long term (current) drug therapy: Secondary | ICD-10-CM | POA: Insufficient documentation

## 2016-03-06 DIAGNOSIS — R5081 Fever presenting with conditions classified elsewhere: Secondary | ICD-10-CM | POA: Diagnosis not present

## 2016-03-06 DIAGNOSIS — Z7982 Long term (current) use of aspirin: Secondary | ICD-10-CM | POA: Insufficient documentation

## 2016-03-06 DIAGNOSIS — R509 Fever, unspecified: Secondary | ICD-10-CM

## 2016-03-06 DIAGNOSIS — Z8701 Personal history of pneumonia (recurrent): Secondary | ICD-10-CM | POA: Diagnosis not present

## 2016-03-06 DIAGNOSIS — Z880 Allergy status to penicillin: Secondary | ICD-10-CM | POA: Diagnosis not present

## 2016-03-06 DIAGNOSIS — I10 Essential (primary) hypertension: Secondary | ICD-10-CM | POA: Insufficient documentation

## 2016-03-06 LAB — URINE MICROSCOPIC-ADD ON
RBC / HPF: NONE SEEN RBC/hpf (ref 0–5)
SQUAMOUS EPITHELIAL / LPF: NONE SEEN

## 2016-03-06 LAB — COMPREHENSIVE METABOLIC PANEL
ALBUMIN: 3.7 g/dL (ref 3.5–5.0)
ALT: 11 U/L — AB (ref 14–54)
AST: 28 U/L (ref 15–41)
Alkaline Phosphatase: 52 U/L (ref 38–126)
Anion gap: 9 (ref 5–15)
BUN: 20 mg/dL (ref 6–20)
CALCIUM: 9.2 mg/dL (ref 8.9–10.3)
CHLORIDE: 100 mmol/L — AB (ref 101–111)
CO2: 31 mmol/L (ref 22–32)
CREATININE: 1.02 mg/dL — AB (ref 0.44–1.00)
GFR calc Af Amer: 54 mL/min — ABNORMAL LOW (ref >60)
GFR, EST NON AFRICAN AMERICAN: 47 mL/min — AB (ref >60)
GLUCOSE: 83 mg/dL (ref 65–99)
POTASSIUM: 4.7 mmol/L (ref 3.5–5.1)
SODIUM: 140 mmol/L (ref 135–145)
TOTAL PROTEIN: 7 g/dL (ref 6.5–8.1)
Total Bilirubin: 0.7 mg/dL (ref 0.3–1.2)

## 2016-03-06 LAB — URINALYSIS, ROUTINE W REFLEX MICROSCOPIC
Bilirubin Urine: NEGATIVE
Glucose, UA: NEGATIVE mg/dL
Hgb urine dipstick: NEGATIVE
KETONES UR: NEGATIVE mg/dL
NITRITE: NEGATIVE
PH: 6.5 (ref 5.0–8.0)
Protein, ur: NEGATIVE mg/dL
SPECIFIC GRAVITY, URINE: 1.01 (ref 1.005–1.030)

## 2016-03-06 LAB — CBC WITH DIFFERENTIAL/PLATELET
BASOS ABS: 0 10*3/uL (ref 0.0–0.1)
BASOS PCT: 0 %
EOS PCT: 2 %
Eosinophils Absolute: 0.1 10*3/uL (ref 0.0–0.7)
HCT: 38.8 % (ref 36.0–46.0)
Hemoglobin: 12 g/dL (ref 12.0–15.0)
Lymphocytes Relative: 18 %
Lymphs Abs: 1.1 10*3/uL (ref 0.7–4.0)
MCH: 29.1 pg (ref 26.0–34.0)
MCHC: 30.9 g/dL (ref 30.0–36.0)
MCV: 94.2 fL (ref 78.0–100.0)
MONO ABS: 0.5 10*3/uL (ref 0.1–1.0)
Monocytes Relative: 8 %
Neutro Abs: 4.4 10*3/uL (ref 1.7–7.7)
Neutrophils Relative %: 72 %
PLATELETS: 189 10*3/uL (ref 150–400)
RBC: 4.12 MIL/uL (ref 3.87–5.11)
RDW: 14 % (ref 11.5–15.5)
WBC: 6.2 10*3/uL (ref 4.0–10.5)

## 2016-03-06 LAB — TROPONIN I: Troponin I: <0.03 ng/mL (ref <0.031)

## 2016-03-06 LAB — I-STAT CG4 LACTIC ACID, ED: Lactic Acid, Venous: 1.1 mmol/L (ref 0.5–2.0)

## 2016-03-06 MED ORDER — SODIUM CHLORIDE 0.9 % IV SOLN
1000.0000 mL | INTRAVENOUS | Status: DC
  Administered 2016-03-06: 1000 mL via INTRAVENOUS

## 2016-03-06 MED ORDER — DOXYCYCLINE HYCLATE 100 MG PO CAPS: 100.0000 mg | ORAL_CAPSULE | Freq: Two times a day (BID) | ORAL | Status: DC

## 2016-03-06 MED ORDER — DOXYCYCLINE HYCLATE 100 MG PO TABS
100.0000 mg | ORAL_TABLET | Freq: Once | ORAL | Status: AC
  Administered 2016-03-06: 100 mg via ORAL
  Filled 2016-03-06: qty 1

## 2016-03-06 MED ORDER — ALPRAZOLAM 0.5 MG PO TABS
0.5000 mg | ORAL_TABLET | Freq: Once | ORAL | Status: AC
  Administered 2016-03-06: 0.5 mg via ORAL
  Filled 2016-03-06: qty 1

## 2016-03-06 MED ORDER — ALBUTEROL SULFATE (2.5 MG/3ML) 0.083% IN NEBU
5.0000 mg | INHALATION_SOLUTION | Freq: Once | RESPIRATORY_TRACT | Status: AC
  Administered 2016-03-06: 5 mg via RESPIRATORY_TRACT
  Filled 2016-03-06: qty 6

## 2016-03-06 NOTE — ED Notes (Signed)
Patient does not think she can urinate in a bedpan or ambulate to the restroom and would like to wait for fluids to infuse. Will inquire about a urine sample in a few minutes.

## 2016-03-06 NOTE — ED Provider Notes (Signed)
CSN: [id number]     Arrival date & time 03/06/16  1715 History   First MD Initiated Contact with Patient 03/06/16 1732     Chief Complaint  Patient presents with  . Fever  . Shortness of Breath     (Consider location/radiation/quality/duration/timing/severity/associated sxs/prior Treatment) Patient is a 82 y.o. female presenting with fever and shortness of breath. The history is provided by the patient, a relative and the EMS personnel.  Fever Associated symptoms: cough   Associated symptoms: no chest pain, no chills, no confusion, no diarrhea, no dysuria, no headaches, no rash, no sore throat and no vomiting   Shortness of Breath Associated symptoms: cough and fever   Associated symptoms: no abdominal pain, no chest pain, no headaches, no neck pain, no rash, no sore throat and no vomiting   Patient w hx copd, presents via ems from [facility], where patient noted to c/o increased sob today. Symptoms constant, persistent, worse w activity and anxiety. Non productive cough. Ems indicates fever to 101. Pt denies chest pain. No increased leg edema, or leg pain. Compliant w normal meds, no recent change in meds or dose. Denies sore throat, runny nose, or other uri c/o.  No chills/sweats.      Past Medical History  Diagnosis Date  . Hypertension   . Anxiety   . Peripheral arterial disease (HCC)   . Combined congestive systolic and diastolic heart failure (HCC)   . COPD with emphysema (HCC)   . Esophageal stricture   . Irritable bowel syndrome   . Pneumonia   . Dementia    Past Surgical History  Procedure Laterality Date  . Femoral bypass    . Esophagogastroduodenoscopy  06/21/2012    Procedure: ESOPHAGOGASTRODUODENOSCOPY (EGD);  Surgeon: Theda Belfast, MD;  Location: [facility];  Service: Endoscopy;  Laterality: N/A;   Family History  Problem Relation Age of Onset  . Coronary artery disease Father   . Coronary artery disease Mother   . Hypertension Mother   . Breast cancer  Maternal Grandmother   . Breast cancer Maternal Aunt    Social History  Substance Use Topics  . Smoking status: Former Smoker    Quit date: 12/24/1996  . Smokeless tobacco: Never Used     Comment: per dtr, pt quit tobacco in 1998.  Marland Kitchen Alcohol Use: No   OB History    No data available     Review of Systems  Constitutional: Positive for fever. Negative for chills.  HENT: Negative for sore throat.   Eyes: Negative for redness.  Respiratory: Positive for cough and shortness of breath.   Cardiovascular: Negative for chest pain and leg swelling.  Gastrointestinal: Negative for vomiting, abdominal pain and diarrhea.  Genitourinary: Negative for dysuria and flank pain.  Musculoskeletal: Negative for back pain and neck pain.  Skin: Negative for rash.  Neurological: Negative for headaches.  Hematological: Does not bruise/bleed easily.  Psychiatric/Behavioral: Negative for confusion.      Allergies  Altace; Ambien; Codeine; Demerol; Penicillins; Sulfa antibiotics; and Versed  Home Medications   Prior to Admission medications   Medication Sig Start Date End Date Taking? Authorizing Provider  acetaminophen (TYLENOL) 325 MG tablet Take 2 tablets (650 mg total) by mouth every 6 (six) hours as needed for fever. 11/12/14  Yes Christina P Rama, MD  albuterol (PROVENTIL) (2.5 MG/3ML) 0.083% nebulizer solution Take 2.5 mg by nebulization 2 (two) times daily.   Yes Historical Provider, MD  ALPRAZolam Prudy Feeler) 0.5 MG tablet Take 0.5 mg  by mouth 2 (two) times daily.    Yes Historical Provider, MD  amLODipine (NORVASC) 5 MG tablet Take 1 tablet (5 mg total) by mouth daily. 11/12/14  Yes Maryruth Bunhristina P Rama, MD  aspirin 81 MG chewable tablet Chew 81 mg by mouth every morning.    Yes Historical Provider, MD  Carboxymethylcellul-Glycerin 0.5-0.9 % SOLN Place 1 drop into both eyes 4 (four) times daily. Reported on 03/02/2016   Yes Historical Provider, MD  Cholecalciferol (VITAMIN D3) 400 UNITS CAPS Take 1  capsule by mouth daily.   Yes Historical Provider, MD  clopidogrel (PLAVIX) 75 MG tablet Take 75 mg by mouth every morning.    Yes Historical Provider, MD  dextromethorphan (DELSYM) 30 MG/5ML liquid Take 2.5 mLs (15 mg total) by mouth at bedtime as needed for cough. 11/12/14  Yes Christina P Rama, MD  docusate sodium (COLACE) 100 MG capsule Take 100 mg by mouth daily.    Yes Historical Provider, MD  famotidine (PEPCID) 20 MG tablet Take 20 mg by mouth at bedtime.   Yes Historical Provider, MD  HYDROcodone-acetaminophen (NORCO/VICODIN) 5-325 MG tablet Take 1 tablet by mouth every 6 (six) hours as needed for moderate pain.   Yes Historical Provider, MD  ipratropium (ATROVENT) 0.02 % nebulizer solution Take 0.5 mg by nebulization every 6 (six) hours as needed for wheezing or shortness of breath.    Yes Historical Provider, MD  Loratadine 10 MG CAPS Take 1 capsule by mouth daily.   Yes Historical Provider, MD  losartan (COZAAR) 25 MG tablet Take 12.5 mg by mouth daily.   Yes Historical Provider, MD  Melatonin 1 MG TABS Take 1 mg by mouth at bedtime.   Yes Historical Provider, MD  metoprolol tartrate (LOPRESSOR) 25 MG tablet Take 25 mg by mouth 2 (two) times daily.   Yes Historical Provider, MD  mirtazapine (REMERON) 7.5 MG tablet Take 7.5 mg by mouth at bedtime.   Yes Historical Provider, MD  nitroGLYCERIN (NITRODUR - DOSED IN MG/24 HR) 0.2 mg/hr patch Place 0.2 mg onto the skin daily.   Yes Historical Provider, MD  Nutritional Supplements (NUTRITIONAL SHAKE PO) Take 1 Container by mouth 3 (three) times daily. Health Shakes TID   Yes Historical Provider, MD  pravastatin (PRAVACHOL) 10 MG tablet Take 10 mg by mouth at bedtime.    Yes Historical Provider, MD  Probiotic Product (ALIGN) 4 MG CAPS Take 1 capsule by mouth daily.   Yes Historical Provider, MD  tiZANidine (ZANAFLEX) 4 MG tablet Take 4 mg by mouth 2 (two) times daily.   Yes Historical Provider, MD  feeding supplement, ENSURE ENLIVE, (ENSURE  ENLIVE) LIQD Take 237 mLs by mouth 2 (two) times daily as needed (Question if pt likes supplement). 01/26/16   Alison MurrayAlma M Devine, MD  PEG 3350-KCl-Na Bicarb-NaCl (TRILYTE PO) Take 8 oz by mouth every Monday. Drink 8 oz glass of Trilyte bowel prep every 15-30 minutes until 1/4 solution is gone every monday    Historical Provider, MD  senna (SENOKOT) 8.6 MG tablet Take 1 tablet by mouth as needed for constipation.    Historical Provider, MD   BP 212/62 mmHg  Pulse 84  Temp(Src) 99.6 F (37.6 C) (Rectal)  Resp 11  Ht 5' (1.524 m)  Wt 36.288 kg  BMI 15.62 kg/m2  SpO2 94% Physical Exam  Constitutional: She appears well-developed and well-nourished. No distress.  HENT:  Mouth/Throat: Oropharynx is clear and moist.  Eyes: Conjunctivae are normal. Pupils are equal, round, and reactive to  light. No scleral icterus.  Neck: Normal range of motion. Neck supple. No tracheal deviation present.  No stiffness or rigidity  Cardiovascular: Normal rate, regular rhythm, normal heart sounds and intact distal pulses.   No murmur heard. Pulmonary/Chest: Effort normal. No respiratory distress.  Upper resp congestion.   Abdominal: Soft. Normal appearance and bowel sounds are normal. She exhibits no distension. There is no tenderness.  Genitourinary:  No cva tenderness  Musculoskeletal: She exhibits no edema.  Neurological: She is alert.  Skin: Skin is warm and dry. No rash noted. She is not diaphoretic.  Psychiatric: She has a normal mood and affect.  Nursing note and vitals reviewed.   ED Course  Procedures (including critical care time) Labs Review   Results for orders placed or performed during the hospital encounter of 03/06/16  Comprehensive metabolic panel  Result Value Ref Range   Sodium 140 135 - 145 mmol/L   Potassium 4.7 3.5 - 5.1 mmol/L   Chloride 100 (L) 101 - 111 mmol/L   CO2 31 22 - 32 mmol/L   Glucose, Bld 83 65 - 99 mg/dL   BUN 20 6 - 20 mg/dL   Creatinine, Ser 4.09 (H) 0.44 - 1.00  mg/dL   Calcium 9.2 8.9 - 81.1 mg/dL   Total Protein 7.0 6.5 - 8.1 g/dL   Albumin 3.7 3.5 - 5.0 g/dL   AST 28 15 - 41 U/L   ALT 11 (L) 14 - 54 U/L   Alkaline Phosphatase 52 38 - 126 U/L   Total Bilirubin 0.7 0.3 - 1.2 mg/dL   GFR calc non Af Amer 47 (L) >60 mL/min   GFR calc Af Amer 54 (L) >60 mL/min   Anion gap 9 5 - 15  CBC WITH DIFFERENTIAL  Result Value Ref Range   WBC 6.2 4.0 - 10.5 K/uL   RBC 4.12 3.87 - 5.11 MIL/uL   Hemoglobin 12.0 12.0 - 15.0 g/dL   HCT 91.4 78.2 - 95.6 %   MCV 94.2 78.0 - 100.0 fL   MCH 29.1 26.0 - 34.0 pg   MCHC 30.9 30.0 - 36.0 g/dL   RDW 21.3 08.6 - 57.8 %   Platelets 189 150 - 400 K/uL   Neutrophils Relative % 72 %   Neutro Abs 4.4 1.7 - 7.7 K/uL   Lymphocytes Relative 18 %   Lymphs Abs 1.1 0.7 - 4.0 K/uL   Monocytes Relative 8 %   Monocytes Absolute 0.5 0.1 - 1.0 K/uL   Eosinophils Relative 2 %   Eosinophils Absolute 0.1 0.0 - 0.7 K/uL   Basophils Relative 0 %   Basophils Absolute 0.0 0.0 - 0.1 K/uL  Urinalysis, Routine w reflex microscopic (not at [facility])  Result Value Ref Range   Color, Urine YELLOW YELLOW   APPearance CLEAR CLEAR   Specific Gravity, Urine 1.010 1.005 - 1.030   pH 6.5 5.0 - 8.0   Glucose, UA NEGATIVE NEGATIVE mg/dL   Hgb urine dipstick NEGATIVE NEGATIVE   Bilirubin Urine NEGATIVE NEGATIVE   Ketones, ur NEGATIVE NEGATIVE mg/dL   Protein, ur NEGATIVE NEGATIVE mg/dL   Nitrite NEGATIVE NEGATIVE   Leukocytes, UA TRACE (A) NEGATIVE  Troponin I  Result Value Ref Range   Troponin I <0.03 <0.031 ng/mL  Urine microscopic-add on  Result Value Ref Range   Squamous Epithelial / LPF NONE SEEN NONE SEEN   WBC, UA 0-5 0 - 5 WBC/hpf   RBC / HPF NONE SEEN 0 - 5 RBC/hpf   Bacteria,  UA RARE (A) NONE SEEN  I-Stat CG4 Lactic Acid, ED  (not at  [facility])  Result Value Ref Range   Lactic Acid, Venous 1.10 0.5 - 2.0 mmol/L    Dg Chest Port 1 View  03/06/2016  CLINICAL DATA:  Shortness of breath and fever. Hypertension. COPD. Ex-smoker.  EXAM: PORTABLE CHEST 1 VIEW COMPARISON:  02/20/2016 FINDINGS: Hyperinflation. Numerous leads and wires project over the chest. Midline trachea. Mild cardiomegaly with transverse aortic atherosclerosis. No pleural effusion or pneumothorax. Chronic interstitial thickening. No lobar consolidation. Mild left base scarring. IMPRESSION: Hyperinflation, without acute superimposed process. Cardiomegaly without congestive failure. Electronically Signed   By: Jeronimo Greaves M.D.   On: 03/06/2016 18:07        I have personally reviewed and evaluated these images and lab results as part of my medical decision-making.   EKG Interpretation   Date/Time:  Tuesday March 06 2016 18:05:50 EDT Ventricular Rate:  62 PR Interval:  163 QRS Duration: 75 QT Interval:  425 QTC Calculation: 432 R Axis:   32 Text Interpretation:  Sinus rhythm Atrial premature complexes Left  ventricular hypertrophy No significant change since last tracing Confirmed  by Denton Lank  MD, Caryn Bee ([id number]) on 03/06/2016 6:08:23 PM      MDM   Iv ns. Oxygen Pablo.   Continuous pulse ox and monitor.   Labs. Portable cxr.  Reviewed nursing notes and prior charts for additional history.   Pt afeb in ed. Lactate normal.   Given cough, hx signif copd, will rx abx/doxy.   Pt/daughter indicate due for her normal xanax, and without it she will become very anxious. Xanax po.  Alb neb re mild wheezing.  Recheck pt feels breathing at baseline. No distress. No chest pain.  Discussed admit vs d/c to [facility], pt indicates feels well enough for d/c, wants to return home.   Pt currently appears stable for d/c. rec close pcp f/u. Return precautions.      Cathren Laine, MD 03/06/16 2032

## 2016-03-06 NOTE — ED Notes (Signed)
Per [facility] pt [facility] [address] for shortness of breath and fever that started this evening. Per EMS pt was A&Ox3 and was placed on 4L O2 via [place] pt on oxygen 3L at baseline. 20ga IV to right AC started by EMS.

## 2016-03-06 NOTE — ED Notes (Signed)
PT DISCHARGED. INSTRUCTIONS AND PRESCRIPTION GIVEN TO PTAR STAFF. AAOX3. PT IN NO APPARENT DISTRESS OR PAIN. THE OPPORTUNITY TO ASK QUESTIONS WAS PROVIDED. 

## 2016-03-06 NOTE — Discharge Instructions (Signed)
It was our pleasure to provide your ER care today - we hope that you feel better.  Take antibiotic as prescribed (doxycyline).  Take tylenol/advil as need.  Use your breathing treatments and oxygen as prescribed by your doctor.  Follow up with your doctor for recheck in the next 1-2 days.  Also have your blood pressure rechecked then as it is high tonight.   Return to ER if worse, new symptoms, increased difficulty breathing, weak/fainting, chest pain, other concern.       Chronic Obstructive Pulmonary Disease Chronic obstructive pulmonary disease (COPD) is a common lung condition in which airflow from the lungs is limited. COPD is a general term that can be used to describe many different lung problems that limit airflow, including both chronic bronchitis and emphysema. If you have COPD, your lung function will probably never return to normal, but there are measures you can take to improve lung function and make yourself feel better. CAUSES   Smoking (common).  Exposure to secondhand smoke.  Genetic problems.  Chronic inflammatory lung diseases or recurrent infections. SYMPTOMS  Shortness of breath, especially with physical activity.  Deep, persistent (chronic) cough with a large amount of thick mucus.  Wheezing.  Rapid breaths (tachypnea).  Gray or bluish discoloration (cyanosis) of the skin, especially in your fingers, toes, or lips.  Fatigue.  Weight loss.  Frequent infections or episodes when breathing symptoms become much worse (exacerbations).  Chest tightness. DIAGNOSIS Your health care provider will take a medical history and perform a physical examination to diagnose COPD. Additional tests for COPD may include:  Lung (pulmonary) function tests.  Chest X-ray.  CT scan.  Blood tests. TREATMENT  Treatment for COPD may include:  Inhaler and nebulizer medicines. These help manage the symptoms of COPD and make your breathing more  comfortable.  Supplemental oxygen. Supplemental oxygen is only helpful if you have a low oxygen level in your blood.  Exercise and physical activity. These are beneficial for nearly all people with COPD.  Lung surgery or transplant.  Nutrition therapy to gain weight, if you are underweight.  Pulmonary rehabilitation. This may involve working with a team of health care providers and specialists, such as respiratory, occupational, and physical therapists. HOME CARE INSTRUCTIONS  Take all medicines (inhaled or pills) as directed by your health care provider.  Avoid over-the-counter medicines or cough syrups that dry up your airway (such as antihistamines) and slow down the elimination of secretions unless instructed otherwise by your health care provider.  If you are a smoker, the most important thing that you can do is stop smoking. Continuing to smoke will cause further lung damage and breathing trouble. Ask your health care provider for help with quitting smoking. He or she can direct you to community resources or hospitals that provide support.  Avoid exposure to irritants such as smoke, chemicals, and fumes that aggravate your breathing.  Use oxygen therapy and pulmonary rehabilitation if directed by your health care provider. If you require home oxygen therapy, ask your health care provider whether you should purchase a pulse oximeter to measure your oxygen level at home.  Avoid contact with individuals who have a contagious illness.  Avoid extreme temperature and humidity changes.  Eat healthy foods. Eating smaller, more frequent meals and resting before meals may help you maintain your strength.  Stay active, but balance activity with periods of rest. Exercise and physical activity will help you maintain your ability to do things you want to do.  Preventing  infection and hospitalization is very important when you have COPD. Make sure to receive all the vaccines your health care  provider recommends, especially the pneumococcal and influenza vaccines. Ask your health care provider whether you need a pneumonia vaccine.  Learn and use relaxation techniques to manage stress.  Learn and use controlled breathing techniques as directed by your health care provider. Controlled breathing techniques include:  Pursed lip breathing. Start by breathing in (inhaling) through your nose for 1 second. Then, purse your lips as if you were going to whistle and breathe out (exhale) through the pursed lips for 2 seconds.  Diaphragmatic breathing. Start by putting one hand on your abdomen just above your waist. Inhale slowly through your nose. The hand on your abdomen should move out. Then purse your lips and exhale slowly. You should be able to feel the hand on your abdomen moving in as you exhale.  Learn and use controlled coughing to clear mucus from your lungs. Controlled coughing is a series of short, progressive coughs. The steps of controlled coughing are:  Lean your head slightly forward.  Breathe in deeply using diaphragmatic breathing.  Try to hold your breath for 3 seconds.  Keep your mouth slightly open while coughing twice.  Spit any mucus out into a tissue.  Rest and repeat the steps once or twice as needed. SEEK MEDICAL CARE IF:  You are coughing up more mucus than usual.  There is a change in the color or thickness of your mucus.  Your breathing is more labored than usual.  Your breathing is faster than usual. SEEK IMMEDIATE MEDICAL CARE IF:  You have shortness of breath while you are resting.  You have shortness of breath that prevents you from:  Being able to talk.  Performing your usual physical activities.  You have chest pain lasting longer than 5 minutes.  Your skin color is more cyanotic than usual.  You measure low oxygen saturations for longer than 5 minutes with a pulse oximeter. MAKE SURE YOU:  Understand these instructions.  Will  watch your condition.  Will get help right away if you are not doing well or get worse.   This information is not intended to replace advice given to you by your health care provider. Make sure you discuss any questions you have with your health care provider.   Document Released: 09/19/2005 Document Revised: 12/31/2014 Document Reviewed: 08/06/2013 Elsevier Interactive Patient Education 2016 [employer] Inc[employer]   Acute Bronchitis Bronchitis is inflammation of the airways that extend from the windpipe into the lungs (bronchi). The inflammation often causes mucus to develop. This leads to a cough, which is the most common symptom of bronchitis.  In acute bronchitis, the condition usually develops suddenly and goes away over time, usually in a couple weeks. Smoking, allergies, and asthma can make bronchitis worse. Repeated episodes of bronchitis may cause further lung problems.  CAUSES Acute bronchitis is most often caused by the same virus that causes a cold. The virus can spread from person to person (contagious) through coughing, sneezing, and touching contaminated objects. SIGNS AND SYMPTOMS   Cough.   Fever.   Coughing up mucus.   Body aches.   Chest congestion.   Chills.   Shortness of breath.   Sore throat.  DIAGNOSIS  Acute bronchitis is usually diagnosed through a physical exam. Your health care provider will also ask you questions about your medical history. Tests, such as chest X-rays, are sometimes done to rule out other conditions.  TREATMENT  Acute bronchitis usually goes away in a couple weeks. Oftentimes, no medical treatment is necessary. Medicines are sometimes given for relief of fever or cough. Antibiotic medicines are usually not needed but may be prescribed in certain situations. In some cases, an inhaler may be recommended to help reduce shortness of breath and control the cough. A cool mist vaporizer may also be used to help thin bronchial secretions and  make it easier to clear the chest.  HOME CARE INSTRUCTIONS  Get plenty of rest.   Drink enough fluids to keep your urine clear or pale yellow (unless you have a medical condition that requires fluid restriction). Increasing fluids may help thin your respiratory secretions (sputum) and reduce chest congestion, and it will prevent dehydration.   Take medicines only as directed by your health care provider.  If you were prescribed an antibiotic medicine, finish it all even if you start to feel better.  Avoid smoking and secondhand smoke. Exposure to cigarette smoke or irritating chemicals will make bronchitis worse. If you are a smoker, consider using nicotine gum or skin patches to help control withdrawal symptoms. Quitting smoking will help your lungs heal faster.   Reduce the chances of another bout of acute bronchitis by washing your hands frequently, avoiding people with cold symptoms, and trying not to touch your hands to your mouth, nose, or eyes.   Keep all follow-up visits as directed by your health care provider.  SEEK MEDICAL CARE IF: Your symptoms do not improve after 1 week of treatment.  SEEK IMMEDIATE MEDICAL CARE IF:  You develop an increased fever or chills.   You have chest pain.   You have severe shortness of breath.  You have bloody sputum.   You develop dehydration.  You faint or repeatedly feel like you are going to pass out.  You develop repeated vomiting.  You develop a severe headache. MAKE SURE YOU:   Understand these instructions.  Will watch your condition.  Will get help right away if you are not doing well or get worse.   This information is not intended to replace advice given to you by your health care provider. Make sure you discuss any questions you have with your health care provider.   Document Released: 01/17/2005 Document Revised: 12/31/2014 Document Reviewed: 06/02/2013 Elsevier Interactive Patient Education 2016 [employer]    Hypertension Hypertension, commonly called high blood pressure, is when the force of blood pumping through your arteries is too strong. Your arteries are the blood vessels that carry blood from your heart throughout your body. A blood pressure reading consists of a higher number over a lower number, such as 110/72. The higher number (systolic) is the pressure inside your arteries when your heart pumps. The lower number (diastolic) is the pressure inside your arteries when your heart relaxes. Ideally you want your blood pressure below 120/80. Hypertension forces your heart to work harder to pump blood. Your arteries may become narrow or stiff. Having untreated or uncontrolled hypertension can cause heart attack, stroke, kidney disease, and other problems. RISK FACTORS Some risk factors for high blood pressure are controllable. Others are not.  Risk factors you cannot control include:   Race. You may be at higher risk if you are African American.  Age. Risk increases with age.  Gender. Men are at higher risk than women before age 80 years. After age 80, women are at higher risk than men. Risk factors you can control include:  Not getting enough  exercise or physical activity.  Being overweight.  Getting too much fat, sugar, calories, or salt in your diet.  Drinking too much alcohol. SIGNS AND SYMPTOMS Hypertension does not usually cause signs or symptoms. Extremely high blood pressure (hypertensive crisis) may cause headache, anxiety, shortness of breath, and nosebleed. DIAGNOSIS To check if you have hypertension, your health care provider will measure your blood pressure while you are seated, with your arm held at the level of your heart. It should be measured at least twice using the same arm. Certain conditions can cause a difference in blood pressure between your right and left arms. A blood pressure reading that is higher than normal on one occasion does not mean that you need  treatment. If it is not clear whether you have high blood pressure, you may be asked to return on a different day to have your blood pressure checked again. Or, you may be asked to monitor your blood pressure at home for 1 or more weeks. TREATMENT Treating high blood pressure includes making lifestyle changes and possibly taking medicine. Living a healthy lifestyle can help lower high blood pressure. You may need to change some of your habits. Lifestyle changes may include:  Following the DASH diet. This diet is high in fruits, vegetables, and whole grains. It is low in salt, red meat, and added sugars.  Keep your sodium intake below 2,300 mg per day.  Getting at least 30-45 minutes of aerobic exercise at least 4 times per week.  Losing weight if necessary.  Not smoking.  Limiting alcoholic beverages.  Learning ways to reduce stress. Your health care provider may prescribe medicine if lifestyle changes are not enough to get your blood pressure under control, and if one of the following is true:  You are 23-44 years of age and your systolic blood pressure is above 140.  You are 51 years of age or older, and your systolic blood pressure is above 150.  Your diastolic blood pressure is above 90.  You have diabetes, and your systolic blood pressure is over 140 or your diastolic blood pressure is over 90.  You have kidney disease and your blood pressure is above 140/90.  You have heart disease and your blood pressure is above 140/90. Your personal target blood pressure may vary depending on your medical conditions, your age, and other factors. HOME CARE INSTRUCTIONS  Have your blood pressure rechecked as directed by your health care provider.   Take medicines only as directed by your health care provider. Follow the directions carefully. Blood pressure medicines must be taken as prescribed. The medicine does not work as well when you skip doses. Skipping doses also puts you at risk for  problems.  Do not smoke.   Monitor your blood pressure at home as directed by your health care provider. SEEK MEDICAL CARE IF:   You think you are having a reaction to medicines taken.  You have recurrent headaches or feel dizzy.  You have swelling in your ankles.  You have trouble with your vision. SEEK IMMEDIATE MEDICAL CARE IF:  You develop a severe headache or confusion.  You have unusual weakness, numbness, or feel faint.  You have severe chest or abdominal pain.  You vomit repeatedly.  You have trouble breathing. MAKE SURE YOU:   Understand these instructions.  Will watch your condition.  Will get help right away if you are not doing well or get worse.   This information is not intended to replace advice given  to you by your health care provider. Make sure you discuss any questions you have with your health care provider.   Document Released: 12/10/2005 Document Revised: 04/26/2015 Document Reviewed: 10/02/2013 Elsevier Interactive Patient Education [employer]

## 2016-03-08 ENCOUNTER — Telehealth: Payer: Self-pay | Admitting: Pulmonary Disease

## 2016-03-08 NOTE — Telephone Encounter (Signed)
She needs to use 3 liters at rest and 4 liters with exertion.  She needs to have her oxygen level stay above 88%.

## 2016-03-08 NOTE — Telephone Encounter (Signed)
Spoke with Smita at [facility], aware of recs.  Nothing further needed.

## 2016-03-08 NOTE — Telephone Encounter (Signed)
Received a call from Smita at [facility].  She said that she needs specific oxygen settings for this patient.  She said that she has been monitoring her o2 during therapy and she drops to low 70's walking 22150ft, but recovers back to low 90's in 1-482min with no distress.  She said that patient loves to walk and will continue to walk if they did not stop her.  She has gone as far as 56500ft.  She says that she is not distressed at [employer]   Smita wants to know if patient's normal saturation level is low.   Dr. Craige CottaSood, please advise.

## 2016-03-11 LAB — CULTURE, BLOOD (ROUTINE X 2)
CULTURE: NO GROWTH
CULTURE: NO GROWTH

## 2016-04-15 ENCOUNTER — Emergency Department (HOSPITAL_COMMUNITY): Payer: Medicare Other

## 2016-04-15 ENCOUNTER — Emergency Department (HOSPITAL_COMMUNITY)
Admission: EM | Admit: 2016-04-15 | Discharge: 2016-04-15 | Disposition: A | Discharge status: Home / Self Care | Payer: Medicare Other | Attending: Emergency Medicine | Admitting: Emergency Medicine

## 2016-04-15 ENCOUNTER — Encounter (HOSPITAL_COMMUNITY): Payer: Self-pay | Admitting: Emergency Medicine

## 2016-04-15 DIAGNOSIS — Z7982 Long term (current) use of aspirin: Secondary | ICD-10-CM | POA: Diagnosis not present

## 2016-04-15 DIAGNOSIS — Z7902 Long term (current) use of antithrombotics/antiplatelets: Secondary | ICD-10-CM | POA: Insufficient documentation

## 2016-04-15 DIAGNOSIS — Z8719 Personal history of other diseases of the digestive system: Secondary | ICD-10-CM | POA: Diagnosis not present

## 2016-04-15 DIAGNOSIS — I5040 Unspecified combined systolic (congestive) and diastolic (congestive) heart failure: Secondary | ICD-10-CM | POA: Diagnosis not present

## 2016-04-15 DIAGNOSIS — Z79899 Other long term (current) drug therapy: Secondary | ICD-10-CM | POA: Insufficient documentation

## 2016-04-15 DIAGNOSIS — R51 Headache: Secondary | ICD-10-CM | POA: Insufficient documentation

## 2016-04-15 DIAGNOSIS — J441 Chronic obstructive pulmonary disease with (acute) exacerbation: Secondary | ICD-10-CM | POA: Insufficient documentation

## 2016-04-15 DIAGNOSIS — Z87891 Personal history of nicotine dependence: Secondary | ICD-10-CM | POA: Insufficient documentation

## 2016-04-15 DIAGNOSIS — F419 Anxiety disorder, unspecified: Secondary | ICD-10-CM | POA: Insufficient documentation

## 2016-04-15 DIAGNOSIS — R0602 Shortness of breath: Secondary | ICD-10-CM

## 2016-04-15 DIAGNOSIS — F0390 Unspecified dementia without behavioral disturbance: Secondary | ICD-10-CM | POA: Insufficient documentation

## 2016-04-15 DIAGNOSIS — I10 Essential (primary) hypertension: Secondary | ICD-10-CM | POA: Diagnosis not present

## 2016-04-15 DIAGNOSIS — Z880 Allergy status to penicillin: Secondary | ICD-10-CM | POA: Insufficient documentation

## 2016-04-15 DIAGNOSIS — R4182 Altered mental status, unspecified: Secondary | ICD-10-CM | POA: Diagnosis present

## 2016-04-15 LAB — COMPREHENSIVE METABOLIC PANEL
ALK PHOS: 46 U/L (ref 38–126)
ALT: 13 U/L — ABNORMAL LOW (ref 14–54)
ANION GAP: 7 (ref 5–15)
AST: 18 U/L (ref 15–41)
Albumin: 2.8 g/dL — ABNORMAL LOW (ref 3.5–5.0)
BUN: 16 mg/dL (ref 6–20)
CALCIUM: 9.4 mg/dL (ref 8.9–10.3)
CHLORIDE: 97 mmol/L — AB (ref 101–111)
CO2: 38 mmol/L — AB (ref 22–32)
Creatinine, Ser: 0.98 mg/dL (ref 0.44–1.00)
GFR calc non Af Amer: 49 mL/min — ABNORMAL LOW (ref >60)
GFR, EST AFRICAN AMERICAN: 57 mL/min — AB (ref >60)
Glucose, Bld: 73 mg/dL (ref 65–99)
Potassium: 4.7 mmol/L (ref 3.5–5.1)
SODIUM: 142 mmol/L (ref 135–145)
Total Bilirubin: 0.3 mg/dL (ref 0.3–1.2)
Total Protein: 5.9 g/dL — ABNORMAL LOW (ref 6.5–8.1)

## 2016-04-15 LAB — CBC WITH DIFFERENTIAL/PLATELET
Basophils Absolute: 0 10*3/uL (ref 0.0–0.1)
Basophils Relative: 0 %
EOS ABS: 0 10*3/uL (ref 0.0–0.7)
Eosinophils Relative: 1 %
HCT: 32.9 % — ABNORMAL LOW (ref 36.0–46.0)
Hemoglobin: 9.5 g/dL — ABNORMAL LOW (ref 12.0–15.0)
LYMPHS ABS: 0.9 10*3/uL (ref 0.7–4.0)
Lymphocytes Relative: 14 %
MCH: 27.8 pg (ref 26.0–34.0)
MCHC: 28.9 g/dL — AB (ref 30.0–36.0)
MCV: 96.2 fL (ref 78.0–100.0)
MONOS PCT: 6 %
Monocytes Absolute: 0.4 10*3/uL (ref 0.1–1.0)
Neutro Abs: 4.8 10*3/uL (ref 1.7–7.7)
Neutrophils Relative %: 79 %
PLATELETS: 200 10*3/uL (ref 150–400)
RBC: 3.42 MIL/uL — ABNORMAL LOW (ref 3.87–5.11)
RDW: 14.1 % (ref 11.5–15.5)
WBC: 6 10*3/uL (ref 4.0–10.5)

## 2016-04-15 LAB — URINE MICROSCOPIC-ADD ON

## 2016-04-15 LAB — URINALYSIS, ROUTINE W REFLEX MICROSCOPIC
BILIRUBIN URINE: NEGATIVE
Glucose, UA: NEGATIVE mg/dL
Hgb urine dipstick: NEGATIVE
Ketones, ur: NEGATIVE mg/dL
Leukocytes, UA: NEGATIVE
NITRITE: NEGATIVE
Protein, ur: 30 mg/dL — AB
SPECIFIC GRAVITY, URINE: 1.015 (ref 1.005–1.030)
pH: 5.5 (ref 5.0–8.0)

## 2016-04-15 MED ORDER — ACETAMINOPHEN 325 MG PO TABS
650.0000 mg | ORAL_TABLET | Freq: Once | ORAL | Status: AC
  Administered 2016-04-15: 650 mg via ORAL
  Filled 2016-04-15: qty 2

## 2016-04-15 NOTE — ED Notes (Addendum)
[facility] from [facility], pt reported to be "groggy" after breakfast, CBG 122, VSS, NAD  EMS gave 0.5 mg Narcan - some improvement on arrival

## 2016-04-15 NOTE — ED Provider Notes (Signed)
CSN: [id number]     Arrival date & time 04/15/16  1148 History   First MD Initiated Contact with Patient 04/15/16 1150     Chief Complaint  Patient presents with  . Altered Mental Status     (Consider location/radiation/quality/duration/timing/severity/associated sxs/prior Treatment) HPI Comments: Patient presents today via EMS from [facility].  According to EMS staff report that the patient was groggy after breakfast today.  Nursing staff at [facility] reported that when patient was eating this morning she seemed to be less responsive.  They state that her head ended up in her eggs.  She was quickly arousable.  They are unsure if she actually loss consciousness.  Unsure if she fell asleep.  She did not stop breathing.  She did not fall.  No seizure activity witnessed.  EMS was then called.  Patient was given .  of Narcan en route, which they report has made her more alert.   Staff report that the patient was complaining of a headache this morning, but otherwise was in her normal state of health prior to this episode.  Staff also report that she takes Xanax and a muscle relaxer together at the same time in the morning and that she did have these medications prior to this episode.  Staff report that she has not had any narcotics in four days.   No recent fevers.  Patient does report that she has had a cough for several weeks.  She denies any chest pain or SOB.  She is on chronic 3 L  [place] oxygen.  No vision changes, focal weakness, numbness, tingling, neck pain/stiffnes, nausea, vomiting, diarrhea, or urinary symptoms.    Patient is a 82 y.o. female presenting with altered mental status. The history is provided by the patient.  Altered Mental Status   Past Medical History  Diagnosis Date  . Hypertension   . Anxiety   . Peripheral arterial disease (HCC)   . Combined congestive systolic and diastolic heart failure (HCC)   . COPD with emphysema (HCC)   . Esophageal stricture   .  Irritable bowel syndrome   . Pneumonia   . Dementia    Past Surgical History  Procedure Laterality Date  . Femoral bypass    . Esophagogastroduodenoscopy  06/21/2012    Procedure: ESOPHAGOGASTRODUODENOSCOPY (EGD);  Surgeon: Theda Belfast, MD;  Location: [facility];  Service: Endoscopy;  Laterality: N/A;   Family History  Problem Relation Age of Onset  . Coronary artery disease Father   . Coronary artery disease Mother   . Hypertension Mother   . Breast cancer Maternal Grandmother   . Breast cancer Maternal Aunt    Social History  Substance Use Topics  . Smoking status: Former Smoker    Quit date: 12/24/1996  . Smokeless tobacco: Never Used     Comment: per dtr, pt quit tobacco in 1998.  Marland Kitchen Alcohol Use: No   OB History    No data available     Review of Systems  All other systems reviewed and are negative.     Allergies  Altace; Ambien; Codeine; Demerol; Penicillins; Sulfa antibiotics; and Versed  Home Medications   Prior to Admission medications   Medication Sig Start Date End Date Taking? Authorizing Provider  acetaminophen (TYLENOL) 325 MG tablet Take 2 tablets (650 mg total) by mouth every 6 (six) hours as needed for fever. 11/12/14  Yes Christina P Rama, MD  albuterol (PROVENTIL) (2.5 MG/3ML) 0.083% nebulizer solution Take 2.5 mg by nebulization 2 (  two) times daily.   Yes Historical Provider, MD  ALPRAZolam Prudy Feeler) 0.5 MG tablet Take 0.5 mg by mouth 2 (two) times daily.    Yes Historical Provider, MD  amLODipine (NORVASC) 5 MG tablet Take 1 tablet (5 mg total) by mouth daily. 11/12/14  Yes Maryruth Bun Rama, MD  aspirin 81 MG chewable tablet Chew 81 mg by mouth every morning.    Yes Historical Provider, MD  Carboxymethylcellul-Glycerin 0.5-0.9 % SOLN Place 1 drop into both eyes 4 (four) times daily. Reported on 03/02/2016   Yes Historical Provider, MD  Cholecalciferol (VITAMIN D3) 400 UNITS CAPS Take 1 capsule by mouth daily.   Yes Historical Provider, MD   clopidogrel (PLAVIX) 75 MG tablet Take 75 mg by mouth every morning.    Yes Historical Provider, MD  docusate sodium (COLACE) 100 MG capsule Take 100 mg by mouth daily.    Yes Historical Provider, MD  donepezil (ARICEPT) 5 MG tablet Take 5 mg by mouth at bedtime.   Yes Historical Provider, MD  HYDROcodone-acetaminophen (NORCO/VICODIN) 5-325 MG tablet Take 1 tablet by mouth every 6 (six) hours as needed for moderate pain.   Yes Historical Provider, MD  ipratropium (ATROVENT) 0.02 % nebulizer solution Take 0.5 mg by nebulization every 6 (six) hours as needed for wheezing or shortness of breath.    Yes Historical Provider, MD  Loratadine 10 MG CAPS Take 1 capsule by mouth daily.   Yes Historical Provider, MD  losartan (COZAAR) 25 MG tablet Take 12.5 mg by mouth daily.   Yes Historical Provider, MD  Melatonin 1 MG TABS Take 1 mg by mouth at bedtime.   Yes Historical Provider, MD  metoprolol tartrate (LOPRESSOR) 25 MG tablet Take 25 mg by mouth 2 (two) times daily.   Yes Historical Provider, MD  mirtazapine (REMERON) 7.5 MG tablet Take 7.5 mg by mouth at bedtime.   Yes Historical Provider, MD  nitroGLYCERIN (NITRODUR - DOSED IN MG/24 HR) 0.2 mg/hr patch Place 0.2 mg onto the skin daily.   Yes Historical Provider, MD  Nutritional Supplements (NUTRITIONAL SHAKE PO) Take 1 Container by mouth 3 (three) times daily. Health Shakes TID   Yes Historical Provider, MD  pravastatin (PRAVACHOL) 10 MG tablet Take 10 mg by mouth at bedtime.    Yes Historical Provider, MD  Probiotic Product (ALIGN) 4 MG CAPS Take 1 capsule by mouth daily.   Yes Historical Provider, MD  ranitidine (ZANTAC) 150 MG capsule Take 150 mg by mouth every evening.   Yes Historical Provider, MD  senna (SENOKOT) 8.6 MG tablet Take 1 tablet by mouth as needed for constipation.   Yes Historical Provider, MD  tiZANidine (ZANAFLEX) 4 MG tablet Take 4 mg by mouth 2 (two) times daily.   Yes Historical Provider, MD   BP 113/54 mmHg  Pulse 67   Temp(Src) 98.4 F (36.9 C) (Oral)  Resp 28  SpO2 95% Physical Exam  Constitutional: She is oriented to person, place, and time. She appears well-developed and well-nourished.  HENT:  Head: Normocephalic and atraumatic.  Mouth/Throat: Oropharynx is clear and moist.  Neck: Normal range of motion. Neck supple.  Cardiovascular: Normal rate, regular rhythm and normal heart sounds.   Pulmonary/Chest: Effort normal and breath sounds normal.  Abdominal: Soft. Bowel sounds are normal. She exhibits no distension and no mass. There is no tenderness. There is no rebound and no guarding.  Musculoskeletal: Normal range of motion.  Neurological: She is alert and oriented to person, place, and time. She has  normal strength. No cranial nerve deficit or sensory deficit. Coordination normal.  Skin: Skin is warm and dry.  Psychiatric: She has a normal mood and affect.  Nursing note and vitals reviewed.   ED Course  Procedures (including critical care time) Labs Review Labs Reviewed  CBC WITH DIFFERENTIAL/PLATELET  COMPREHENSIVE METABOLIC PANEL  URINALYSIS, ROUTINE W REFLEX MICROSCOPIC (NOT AT [facility])    Imaging Review Dg Chest 2 View  04/15/2016  CLINICAL DATA:  Chronic short of breath EXAM: CHEST  2 VIEW COMPARISON:  03/06/2016 FINDINGS: Stable enlarged cardiac silhouette. Lungs are hyperinflated. There is chronic bronchitic markings. Small bilateral pleural effusions. No pulmonary edema. No pneumothorax osteopenia. IMPRESSION: 1. Hyperinflated lungs with chronic bronchitic markings. 2. small bilateral pleural effusions. Electronically Signed   By: Genevive BiStewart  Edmunds M.D.   On: 04/15/2016 13:57   Ct Head Wo Contrast  04/15/2016  CLINICAL DATA:  Altered mental status, lethargy, dementia EXAM: CT HEAD WITHOUT CONTRAST TECHNIQUE: Contiguous axial images were obtained from the base of the skull through the vertex without intravenous contrast. COMPARISON:  09/18/2011 FINDINGS: No evidence of parenchymal  hemorrhage or extra-axial fluid collection. No mass lesion, mass effect, or midline shift. Small hypodensity in the left cerebellum is new from 2012 (series 2/image 9) and technically reflects age-indeterminate infarct, but is favored to be chronic. No CT findings suspicious for acute infarction. Subcortical white matter and periventricular small vessel ischemic changes. Global cortical and central atrophy. Secondary ventricular prominence. Chronic wall thickening involving the left maxillary sinus. Visualized paranasal sinuses and mastoid air cells are essentially clear. No evidence of calvarial fracture. IMPRESSION: Small hypodensity in the left cerebellum, likely reflecting chronic lacunar infarct, technically age indeterminate. No evidence of acute intracranial abnormality. Atrophy with small vessel ischemic changes. Electronically Signed   By: Charline BillsSriyesh  Krishnan M.D.   On: 04/15/2016 14:23   I have personally reviewed and evaluated these images and lab results as part of my medical decision-making.   EKG Interpretation None      MDM   Final diagnoses:  SOB (shortness of breath)   Patient [facility] due to an episode this morning where she was less alert.  [facility] to obtain more information.  It is unclear if she loss consciousness.  However, they report that she was quickly arousable.  She did not fall during this episode.  Nursing staff that I spoke with suspected that this episode may be secondary to polypharmacy.  She apparently takes both a Zanaflex and Xanax in the morning.  Patient is able to answer questions appropriately in the ED.  Afebrile, VSS.  No hypoxia on 3 L [place], which is what she is on chronically.  No focal neurological deficits.  She denies any CP or SOB.  Labs unremarkable.  UA is negative.  CT head is negative for acute findings.  CXR is also negative for acute findings.  Feel that the patient is stable for discharge back  to the nursing home.  Stable for discharge.  Return precautions given.  Patient discussed with Dr. Donnald GarrePfeiffer who is in agreement with the plan.     Santiago GladHeather Shanessa Hodak, PA-C 04/16/16 2335  Santiago GladHeather Ayssa Bentivegna, PA-C 04/16/16 [phone]  Arby BarretteMarcy Pfeiffer, MD 04/19/16 [phone]

## 2016-04-15 NOTE — ED Notes (Signed)
[facility] has arrived and patient being taken to Nursing facility

## 2016-04-15 NOTE — Discharge Instructions (Signed)
No cause for this episode was identified in the Emergency Department.  This may be related to taking the muscle relaxer and Xanax at the same time.  It is recommended to reevaluate this.

## 2016-04-20 ENCOUNTER — Emergency Department (HOSPITAL_COMMUNITY)
Admission: EM | Admit: 2016-04-20 | Discharge: 2016-04-23 | Disposition: E | Payer: Medicare Other | Attending: Emergency Medicine | Admitting: Emergency Medicine

## 2016-04-20 ENCOUNTER — Emergency Department (HOSPITAL_COMMUNITY): Payer: Medicare Other

## 2016-04-20 ENCOUNTER — Encounter (HOSPITAL_COMMUNITY): Payer: Self-pay | Admitting: Emergency Medicine

## 2016-04-20 DIAGNOSIS — J439 Emphysema, unspecified: Secondary | ICD-10-CM | POA: Insufficient documentation

## 2016-04-20 DIAGNOSIS — I5040 Unspecified combined systolic (congestive) and diastolic (congestive) heart failure: Secondary | ICD-10-CM | POA: Diagnosis not present

## 2016-04-20 DIAGNOSIS — J9602 Acute respiratory failure with hypercapnia: Secondary | ICD-10-CM | POA: Diagnosis not present

## 2016-04-20 DIAGNOSIS — I10 Essential (primary) hypertension: Secondary | ICD-10-CM | POA: Insufficient documentation

## 2016-04-20 DIAGNOSIS — I739 Peripheral vascular disease, unspecified: Secondary | ICD-10-CM | POA: Insufficient documentation

## 2016-04-20 DIAGNOSIS — F419 Anxiety disorder, unspecified: Secondary | ICD-10-CM | POA: Diagnosis not present

## 2016-04-20 DIAGNOSIS — Z7982 Long term (current) use of aspirin: Secondary | ICD-10-CM | POA: Diagnosis not present

## 2016-04-20 DIAGNOSIS — Z880 Allergy status to penicillin: Secondary | ICD-10-CM | POA: Diagnosis not present

## 2016-04-20 DIAGNOSIS — Z79899 Other long term (current) drug therapy: Secondary | ICD-10-CM | POA: Insufficient documentation

## 2016-04-20 DIAGNOSIS — R0602 Shortness of breath: Secondary | ICD-10-CM

## 2016-04-20 DIAGNOSIS — Z8719 Personal history of other diseases of the digestive system: Secondary | ICD-10-CM | POA: Insufficient documentation

## 2016-04-20 DIAGNOSIS — Z7902 Long term (current) use of antithrombotics/antiplatelets: Secondary | ICD-10-CM | POA: Insufficient documentation

## 2016-04-20 DIAGNOSIS — F0390 Unspecified dementia without behavioral disturbance: Secondary | ICD-10-CM | POA: Insufficient documentation

## 2016-04-20 DIAGNOSIS — R61 Generalized hyperhidrosis: Secondary | ICD-10-CM | POA: Insufficient documentation

## 2016-04-20 DIAGNOSIS — Z8701 Personal history of pneumonia (recurrent): Secondary | ICD-10-CM | POA: Insufficient documentation

## 2016-04-20 DIAGNOSIS — Z66 Do not resuscitate: Secondary | ICD-10-CM | POA: Insufficient documentation

## 2016-04-20 DIAGNOSIS — Z9981 Dependence on supplemental oxygen: Secondary | ICD-10-CM | POA: Diagnosis not present

## 2016-04-20 DIAGNOSIS — Z87891 Personal history of nicotine dependence: Secondary | ICD-10-CM | POA: Insufficient documentation

## 2016-04-20 LAB — BASIC METABOLIC PANEL
ANION GAP: 11 (ref 5–15)
BUN: 7 mg/dL (ref 6–20)
CHLORIDE: 92 mmol/L — AB (ref 101–111)
CO2: 39 mmol/L — ABNORMAL HIGH (ref 22–32)
Calcium: 9.4 mg/dL (ref 8.9–10.3)
Creatinine, Ser: 0.9 mg/dL (ref 0.44–1.00)
GFR, EST NON AFRICAN AMERICAN: 54 mL/min — AB (ref >60)
Glucose, Bld: 190 mg/dL — ABNORMAL HIGH (ref 65–99)
POTASSIUM: 3.9 mmol/L (ref 3.5–5.1)
SODIUM: 142 mmol/L (ref 135–145)

## 2016-04-20 LAB — BRAIN NATRIURETIC PEPTIDE: B NATRIURETIC PEPTIDE 5: 736.8 pg/mL — AB (ref 0.0–100.0)

## 2016-04-20 LAB — I-STAT TROPONIN, ED: TROPONIN I, POC: 0.08 ng/mL (ref 0.00–0.08)

## 2016-04-20 MED ORDER — GLYCOPYRROLATE 0.2 MG/ML IJ SOLN
0.1000 mg | Freq: Once | INTRAMUSCULAR | Status: DC
  Filled 2016-04-20: qty 1

## 2016-04-20 MED ORDER — ALBUTEROL (5 MG/ML) CONTINUOUS INHALATION SOLN
10.0000 mg/h | INHALATION_SOLUTION | Freq: Once | RESPIRATORY_TRACT | Status: AC
  Administered 2016-04-20: 10 mg/h via RESPIRATORY_TRACT
  Filled 2016-04-20 (×2): qty 20

## 2016-04-20 MED ORDER — MORPHINE SULFATE (PF) 2 MG/ML IV SOLN
2.0000 mg | Freq: Once | INTRAVENOUS | Status: DC
  Filled 2016-04-20: qty 1

## 2016-04-20 MED ORDER — MORPHINE SULFATE (PF) 2 MG/ML IV SOLN
INTRAVENOUS | Status: AC
  Administered 2016-04-20: 2 mg via INTRAVENOUS
  Filled 2016-04-20: qty 1

## 2016-04-20 MED ORDER — MORPHINE SULFATE (PF) 2 MG/ML IV SOLN: 2.0000 mg | Freq: Once | INTRAVENOUS | Status: DC

## 2016-04-23 NOTE — ED Notes (Signed)
Pt vitals declining. Agonal breathing present. MD request morphine 2mg .

## 2016-04-23 NOTE — ED Notes (Addendum)
MD at bedside. Time of death 22:20

## 2016-04-23 NOTE — ED Notes (Signed)
MD at bedside. 

## 2016-04-23 NOTE — Progress Notes (Signed)
Chaplain was page to give family support after Ms. Azucena CecilBurton passed. Chaplain provided emotional and spiritual care via prayer. Please page chaplain if family is in any need.

## 2016-04-23 NOTE — ED Notes (Signed)
Family at bedside. 

## 2016-04-23 NOTE — ED Notes (Signed)
O2 stats dropping. MD at bedside. Family present.

## 2016-04-23 NOTE — ED Notes (Signed)
Pt diaphoretic.  

## 2016-04-23 NOTE — ED Notes (Signed)
Pt arrived via EMS lethargic and short of breath. Upon EMS [facility], pt o2 stat was 86. Pt given Solumedrol and arrived on albuterol. Post O2 stat 96. Pt has difficulty hearing and hx of dementia and COPD. Breathing shallow, lung sound diminished. Today pt had an onset of respiratory distress.

## 2016-04-23 NOTE — ED Provider Notes (Signed)
CSN: [id number]     Arrival date & time 12-06-16  2030 History   None    Chief Complaint  Patient presents with  . Shortness of Breath     (Consider location/radiation/quality/duration/timing/severity/associated sxs/prior Treatment) HPI 82 y.o. female with a hx of end-stage COPD, CHF on 3L of O2 at all times by [place] presents to the ED by EMS from her nursing facility noting worsening respiratory distress this evening. Reportedly the patient had an episode earlier this week where she had a transient alteration of consciousness which was felt to be medication related. Since then she seemed to improve briefly but then gradually had worsening respiratory distress. EMS was called this evening when she was going to receive her nightly albuterol treatment and she had respiratory distress and hypoxia. She was found to be sating in the low 80's on arrival by EMS on her home O2 receiving her normal albuterol neb. While en route she was given 125mg  of solumedrol and 2 duo nebs. The patient has a history of dementia but is normally able to communicate appropriately. On arrival the patient is unable to speak. Further history obtained from her daughter who arrived shortly thereafter.  She has a DNR in place.   Past Medical History  Diagnosis Date  . Hypertension   . Anxiety   . Peripheral arterial disease (HCC)   . Combined congestive systolic and diastolic heart failure (HCC)   . COPD with emphysema (HCC)   . Esophageal stricture   . Irritable bowel syndrome   . Pneumonia   . Dementia    Past Surgical History  Procedure Laterality Date  . Femoral bypass    . Esophagogastroduodenoscopy  06/21/2012    Procedure: ESOPHAGOGASTRODUODENOSCOPY (EGD);  Surgeon: Theda BelfastPatrick D Hung, MD;  [facility];  Service: Endoscopy;  Laterality: N/A;   Family History  Problem Relation Age of Onset  . Coronary artery disease Father   . Coronary artery disease Mother   . Hypertension Mother   . Breast cancer  Maternal Grandmother   . Breast cancer Maternal Aunt    Social History  Substance Use Topics  . Smoking status: Former Smoker    Quit date: 12/24/1996  . Smokeless tobacco: Never Used     Comment: per dtr, pt quit tobacco in 1998.  Marland Kitchen. Alcohol Use: No   OB History    No data available     Review of Systems  Unable to perform ROS: Severe respiratory distress      Allergies  Altace; Ambien; Codeine; Demerol; Penicillins; Sulfa antibiotics; and Versed  Home Medications   Prior to Admission medications   Medication Sig Start Date End Date Taking? Authorizing Provider  acetaminophen (TYLENOL) 325 MG tablet Take 2 tablets (650 mg total) by mouth every 6 (six) hours as needed for fever. 11/12/14  Yes Christina P Rama, MD  albuterol (PROVENTIL) (2.5 MG/3ML) 0.083% nebulizer solution Take 2.5 mg by nebulization 2 (two) times daily.   Yes Historical Provider, MD  ALPRAZolam Prudy Feeler(XANAX) 0.5 MG tablet Take 0.5 mg by mouth 2 (two) times daily.    Yes Historical Provider, MD  amLODipine (NORVASC) 5 MG tablet Take 5 mg by mouth daily.   Yes Historical Provider, MD  aspirin 81 MG chewable tablet Chew 81 mg by mouth daily.   Yes Historical Provider, MD  Carboxymethylcellul-Glycerin 0.5-0.9 % SOLN Place 1 drop into both eyes 4 (four) times daily. Reported on 30-Jan-2016   Yes Historical Provider, MD  cholecalciferol (VITAMIN D) 400 units  TABS tablet Take 400 Units by mouth daily.   Yes Historical Provider, MD  clopidogrel (PLAVIX) 75 MG tablet Take 75 mg by mouth every morning.    Yes Historical Provider, MD  donepezil (ARICEPT) 5 MG tablet Take 5 mg by mouth at bedtime.   Yes Historical Provider, MD  HYDROcodone-acetaminophen (NORCO/VICODIN) 5-325 MG tablet Take 1 tablet by mouth every 6 (six) hours as needed for moderate pain.   Yes Historical Provider, MD  ipratropium (ATROVENT) 0.02 % nebulizer solution Take 0.5 mg by nebulization every 6 (six) hours as needed for wheezing or shortness of breath.     Yes Historical Provider, MD  loratadine (CLARITIN) 10 MG tablet Take 10 mg by mouth at bedtime.   Yes Historical Provider, MD  losartan (COZAAR) 25 MG tablet Take 12.5 mg by mouth daily.   Yes Historical Provider, MD  Melatonin 1 MG TABS Take 1 mg by mouth at bedtime.   Yes Historical Provider, MD  metoprolol tartrate (LOPRESSOR) 25 MG tablet Take 25 mg by mouth 2 (two) times daily.   Yes Historical Provider, MD  mirtazapine (REMERON) 7.5 MG tablet Take 7.5 mg by mouth at bedtime.   Yes Historical Provider, MD  nitroGLYCERIN (NITRODUR - DOSED IN MG/24 HR) 0.2 mg/hr patch Place 0.2 mg onto the skin daily.   Yes Historical Provider, MD  Nutritional Supplements (NUTRITIONAL SHAKE PO) Take 1 Container by mouth 3 (three) times daily. Health Shakes TID   Yes Historical Provider, MD  ondansetron (ZOFRAN) 4 MG tablet Take 4 mg by mouth every 6 (six) hours as needed for nausea or vomiting.   Yes Historical Provider, MD  OXYGEN Inhale 3 L into the lungs continuous.   Yes Historical Provider, MD  OXYGEN Inhale 4 L into the lungs as needed (for exertion).   Yes Historical Provider, MD  pravastatin (PRAVACHOL) 10 MG tablet Take 10 mg by mouth at bedtime.   Yes Historical Provider, MD  Probiotic Product (ALIGN) 4 MG CAPS Take 1 capsule by mouth daily.   Yes Historical Provider, MD  ranitidine (ZANTAC) 150 MG capsule Take 150 mg by mouth every evening.   Yes Historical Provider, MD  senna (SENOKOT) 8.6 MG tablet Take 1 tablet by mouth as needed for constipation.   Yes Historical Provider, MD  tiZANidine (ZANAFLEX) 4 MG tablet Take 4 mg by mouth 2 (two) times daily.   Yes Historical Provider, MD  docusate sodium (COLACE) 100 MG capsule Take 100 mg by mouth at bedtime.     Historical Provider, MD   BP 220/98 mmHg  Pulse 75  Temp(Src) 98.7 F (37.1 C) (Rectal)  Resp 18  SpO2 80% Physical Exam  Constitutional: She appears cachectic. She appears ill. She appears distressed. Face mask in place.  HENT:  Head:  Normocephalic and atraumatic.  Nose: Nose normal.  Mouth/Throat: Oropharynx is clear and moist.  Eyes: Conjunctivae are normal. Pupils are equal, round, and reactive to light.  Neck: Neck supple.  Cardiovascular: Normal rate, regular rhythm, normal heart sounds and intact distal pulses.   Pulmonary/Chest: Accessory muscle usage present. Tachypnea noted. She is in respiratory distress. She has decreased breath sounds. She has wheezes. She has rales in the right lower field and the left lower field.  Abdominal: Soft. She exhibits no distension. There is no tenderness.  Musculoskeletal: She exhibits no edema or tenderness.  Neurological: She is alert.  Unable to speak due to respiratory distress, fatigue, dementia  Skin: Skin is warm. She is diaphoretic.  Nursing note and vitals reviewed.   ED Course  Procedures (including critical care time) Labs Review Labs Reviewed  BASIC METABOLIC PANEL - Abnormal; Notable for the following:    Chloride 92 (*)    CO2 39 (*)    Glucose, Bld 190 (*)    GFR calc non Af Amer 54 (*)    All other components within normal limits  BRAIN NATRIURETIC PEPTIDE - Abnormal; Notable for the following:    B Natriuretic Peptide 736.8 (*)    All other components within normal limits  CBC WITH DIFFERENTIAL/PLATELET  CBC WITH DIFFERENTIAL/PLATELET  Rosezena Sensor, ED    Imaging Review Dg Chest Port 1 View  05-13-16  CLINICAL DATA:  Shortness of breath EXAM: PORTABLE CHEST 1 VIEW COMPARISON:  04/15/2016 FINDINGS: Chronic cardiomegaly and hyperinflation. There is interstitial coarsening with Charyl Dancer lines that is increased. Probable trace effusions. IMPRESSION: 1. Progressive interstitial opacity, likely CHF. 2. Background COPD. Electronically Signed   By: Marnee Spring M.D.   On: 05-13-16 21:30   I have personally reviewed and evaluated these images and lab results as part of my medical decision-making.   EKG Interpretation None      MDM  82 y.o.  female presents to the ED by EMS for respiratory distress in the setting of end-stage COPD. On arrival she is satting normally with albuterol neb in place, AF, with tachypnea and retractions noted. She was started on a continuous neb in the ED, labs and CXR were ordered including ABG.  ABG showed severe acidosis with pH of 7.003 and PCO2 >100. Shortly after that she had complete respiratory failure while on a continuous neb with saturations dropping to the 60's then gradually down to the 20's. She has a DNR in place. Her care was discussed with her daughter at the bedside who agreed to pursue comfort measures. The decision was made to hold on Bipap and allow her to pass with dignity. She was switched to comfort measures with agonal breathing and bradycardia. She was given morphine and robinul for secretions and air hunger.   Her time of death was 10:20PM.  Final diagnoses:  Acute respiratory failure with hypercapnia (HCC)        Francoise Ceo, DO 04/21/16 1610  Azalia Bilis, MD 04/22/16 [phone]

## 2016-04-23 DEATH — deceased

## 2016-06-05 ENCOUNTER — Ambulatory Visit: Payer: Medicare Other | Admitting: Pulmonary Disease
